# Patient Record
Sex: Female | Born: 1945 | Race: White | Hispanic: No | Smoking: Former smoker
Health system: Southern US, Community
[De-identification: ages and names within clinical notes are randomized; demographics above are authoritative.]

## PROBLEM LIST (undated history)

## (undated) DIAGNOSIS — C801 Malignant (primary) neoplasm, unspecified: Secondary | ICD-10-CM

## (undated) DIAGNOSIS — E119 Type 2 diabetes mellitus without complications: Secondary | ICD-10-CM

## (undated) DIAGNOSIS — K219 Gastro-esophageal reflux disease without esophagitis: Secondary | ICD-10-CM

## (undated) DIAGNOSIS — J449 Chronic obstructive pulmonary disease, unspecified: Secondary | ICD-10-CM

## (undated) DIAGNOSIS — E785 Hyperlipidemia, unspecified: Secondary | ICD-10-CM

## (undated) DIAGNOSIS — I10 Essential (primary) hypertension: Secondary | ICD-10-CM

## (undated) DIAGNOSIS — I700 Atherosclerosis of aorta: Secondary | ICD-10-CM

## (undated) DIAGNOSIS — F419 Anxiety disorder, unspecified: Secondary | ICD-10-CM

## (undated) DIAGNOSIS — I2510 Atherosclerotic heart disease of native coronary artery without angina pectoris: Principal | ICD-10-CM

## (undated) DIAGNOSIS — H547 Unspecified visual loss: Secondary | ICD-10-CM

## (undated) DIAGNOSIS — E039 Hypothyroidism, unspecified: Secondary | ICD-10-CM

## (undated) DIAGNOSIS — K5281 Eosinophilic gastritis or gastroenteritis: Secondary | ICD-10-CM

## (undated) DIAGNOSIS — K589 Irritable bowel syndrome without diarrhea: Secondary | ICD-10-CM

## (undated) DIAGNOSIS — F17201 Nicotine dependence, unspecified, in remission: Secondary | ICD-10-CM

## (undated) DIAGNOSIS — E669 Obesity, unspecified: Secondary | ICD-10-CM

## (undated) HISTORY — PX: CHOLECYSTECTOMY: SHX55

## (undated) HISTORY — DX: Gastro-esophageal reflux disease without esophagitis: K21.9

## (undated) HISTORY — PX: APPENDECTOMY: SHX54

## (undated) HISTORY — PX: LUMBAR SPINE SURGERY: SHX701

## (undated) HISTORY — DX: Hyperlipidemia, unspecified: E78.5

## (undated) HISTORY — DX: Hypothyroidism, unspecified: E03.9

## (undated) HISTORY — PX: EYE SURGERY: SHX253

## (undated) HISTORY — DX: Eosinophilic gastritis or gastroenteritis: K52.81

## (undated) HISTORY — DX: Nicotine dependence, unspecified, in remission: F17.201

## (undated) HISTORY — DX: Atherosclerotic heart disease of native coronary artery without angina pectoris: I25.10

## (undated) HISTORY — PX: ABDOMINAL HYSTERECTOMY: SHX81

## (undated) HISTORY — DX: Anxiety disorder, unspecified: F41.9

## (undated) HISTORY — DX: Essential (primary) hypertension: I10

## (undated) HISTORY — DX: Obesity, unspecified: E66.9

## (undated) HISTORY — DX: Unspecified visual loss: H54.7

---

## 1971-06-01 DIAGNOSIS — H547 Unspecified visual loss: Secondary | ICD-10-CM

## 1971-06-01 HISTORY — DX: Unspecified visual loss: H54.7

## 2001-03-18 ENCOUNTER — Emergency Department (HOSPITAL_COMMUNITY): Admission: EM | Admit: 2001-03-18 | Discharge: 2001-03-18 | Payer: Self-pay | Admitting: Emergency Medicine

## 2001-03-18 ENCOUNTER — Encounter: Payer: Self-pay | Admitting: Emergency Medicine

## 2001-04-03 ENCOUNTER — Other Ambulatory Visit: Admission: RE | Admit: 2001-04-03 | Discharge: 2001-04-03 | Payer: Self-pay | Admitting: Family Medicine

## 2001-05-31 DIAGNOSIS — I2510 Atherosclerotic heart disease of native coronary artery without angina pectoris: Secondary | ICD-10-CM

## 2001-05-31 HISTORY — DX: Atherosclerotic heart disease of native coronary artery without angina pectoris: I25.10

## 2001-07-11 ENCOUNTER — Ambulatory Visit (HOSPITAL_COMMUNITY): Admission: RE | Admit: 2001-07-11 | Discharge: 2001-07-11 | Payer: Self-pay | Admitting: Family Medicine

## 2001-07-11 ENCOUNTER — Encounter: Payer: Self-pay | Admitting: Family Medicine

## 2001-07-18 ENCOUNTER — Ambulatory Visit (HOSPITAL_COMMUNITY): Admission: RE | Admit: 2001-07-18 | Discharge: 2001-07-19 | Payer: Self-pay | Admitting: Cardiology

## 2004-03-17 ENCOUNTER — Ambulatory Visit (HOSPITAL_COMMUNITY): Admission: RE | Admit: 2004-03-17 | Discharge: 2004-03-17 | Payer: Self-pay | Admitting: Family Medicine

## 2004-10-08 ENCOUNTER — Inpatient Hospital Stay (HOSPITAL_COMMUNITY): Admission: AD | Admit: 2004-10-08 | Discharge: 2004-10-14 | Payer: Self-pay | Admitting: Internal Medicine

## 2004-10-08 ENCOUNTER — Emergency Department (HOSPITAL_COMMUNITY): Admission: EM | Admit: 2004-10-08 | Discharge: 2004-10-08 | Payer: Self-pay | Admitting: Emergency Medicine

## 2004-10-13 ENCOUNTER — Ambulatory Visit: Payer: Self-pay | Admitting: Cardiology

## 2004-10-27 ENCOUNTER — Ambulatory Visit: Payer: Self-pay | Admitting: *Deleted

## 2005-08-10 ENCOUNTER — Inpatient Hospital Stay (HOSPITAL_COMMUNITY): Admission: EM | Admit: 2005-08-10 | Discharge: 2005-08-11 | Payer: Self-pay | Admitting: Emergency Medicine

## 2005-08-11 ENCOUNTER — Ambulatory Visit: Payer: Self-pay | Admitting: Cardiology

## 2005-08-13 ENCOUNTER — Encounter (HOSPITAL_COMMUNITY): Admission: RE | Admit: 2005-08-13 | Discharge: 2005-09-12 | Payer: Self-pay | Admitting: Cardiology

## 2005-08-13 ENCOUNTER — Ambulatory Visit: Payer: Self-pay | Admitting: Internal Medicine

## 2005-08-29 ENCOUNTER — Emergency Department (HOSPITAL_COMMUNITY): Admission: EM | Admit: 2005-08-29 | Discharge: 2005-08-29 | Payer: Self-pay | Admitting: Emergency Medicine

## 2005-11-15 ENCOUNTER — Ambulatory Visit (HOSPITAL_COMMUNITY): Admission: RE | Admit: 2005-11-15 | Discharge: 2005-11-15 | Payer: Self-pay | Admitting: Internal Medicine

## 2005-11-28 HISTORY — PX: COLONOSCOPY: SHX174

## 2005-11-28 HISTORY — PX: ESOPHAGOGASTRODUODENOSCOPY: SHX1529

## 2005-12-08 ENCOUNTER — Ambulatory Visit: Payer: Self-pay | Admitting: Internal Medicine

## 2005-12-23 ENCOUNTER — Ambulatory Visit (HOSPITAL_COMMUNITY): Admission: RE | Admit: 2005-12-23 | Discharge: 2005-12-23 | Payer: Self-pay | Admitting: Internal Medicine

## 2005-12-23 ENCOUNTER — Encounter (INDEPENDENT_AMBULATORY_CARE_PROVIDER_SITE_OTHER): Payer: Self-pay | Admitting: Specialist

## 2005-12-23 ENCOUNTER — Ambulatory Visit: Payer: Self-pay | Admitting: Internal Medicine

## 2006-11-18 ENCOUNTER — Ambulatory Visit (HOSPITAL_COMMUNITY): Admission: RE | Admit: 2006-11-18 | Discharge: 2006-11-18 | Payer: Self-pay | Admitting: Internal Medicine

## 2007-02-24 ENCOUNTER — Ambulatory Visit: Payer: Self-pay | Admitting: Cardiology

## 2007-02-24 ENCOUNTER — Observation Stay (HOSPITAL_COMMUNITY): Admission: EM | Admit: 2007-02-24 | Discharge: 2007-02-25 | Payer: Self-pay | Admitting: Emergency Medicine

## 2007-08-17 ENCOUNTER — Emergency Department (HOSPITAL_COMMUNITY): Admission: EM | Admit: 2007-08-17 | Discharge: 2007-08-17 | Payer: Self-pay | Admitting: Emergency Medicine

## 2007-12-05 ENCOUNTER — Ambulatory Visit (HOSPITAL_COMMUNITY): Admission: RE | Admit: 2007-12-05 | Discharge: 2007-12-05 | Payer: Self-pay | Admitting: Internal Medicine

## 2008-03-14 ENCOUNTER — Ambulatory Visit: Payer: Self-pay | Admitting: Cardiology

## 2009-02-06 ENCOUNTER — Emergency Department (HOSPITAL_COMMUNITY): Admission: EM | Admit: 2009-02-06 | Discharge: 2009-02-06 | Payer: Self-pay | Admitting: Emergency Medicine

## 2009-05-31 DIAGNOSIS — K5281 Eosinophilic gastritis or gastroenteritis: Secondary | ICD-10-CM

## 2009-05-31 HISTORY — DX: Eosinophilic gastritis or gastroenteritis: K52.81

## 2009-09-29 DIAGNOSIS — E039 Hypothyroidism, unspecified: Secondary | ICD-10-CM | POA: Insufficient documentation

## 2009-09-29 DIAGNOSIS — I10 Essential (primary) hypertension: Secondary | ICD-10-CM | POA: Insufficient documentation

## 2009-09-29 DIAGNOSIS — E782 Mixed hyperlipidemia: Secondary | ICD-10-CM | POA: Insufficient documentation

## 2009-09-29 DIAGNOSIS — F411 Generalized anxiety disorder: Secondary | ICD-10-CM | POA: Insufficient documentation

## 2009-09-29 DIAGNOSIS — E785 Hyperlipidemia, unspecified: Secondary | ICD-10-CM | POA: Insufficient documentation

## 2009-09-30 ENCOUNTER — Ambulatory Visit: Payer: Self-pay | Admitting: Internal Medicine

## 2009-10-09 ENCOUNTER — Encounter (INDEPENDENT_AMBULATORY_CARE_PROVIDER_SITE_OTHER): Payer: Self-pay

## 2009-10-15 ENCOUNTER — Encounter: Payer: Self-pay | Admitting: Urgent Care

## 2009-11-04 ENCOUNTER — Ambulatory Visit: Payer: Self-pay | Admitting: Internal Medicine

## 2009-11-25 ENCOUNTER — Encounter: Payer: Self-pay | Admitting: Urgent Care

## 2009-11-25 ENCOUNTER — Telehealth (INDEPENDENT_AMBULATORY_CARE_PROVIDER_SITE_OTHER): Payer: Self-pay

## 2009-11-26 ENCOUNTER — Encounter: Payer: Self-pay | Admitting: Urgent Care

## 2009-11-28 ENCOUNTER — Telehealth (INDEPENDENT_AMBULATORY_CARE_PROVIDER_SITE_OTHER): Payer: Self-pay

## 2009-11-28 ENCOUNTER — Encounter: Payer: Self-pay | Admitting: Gastroenterology

## 2009-12-05 ENCOUNTER — Encounter: Payer: Self-pay | Admitting: Gastroenterology

## 2009-12-05 LAB — CONVERTED CEMR LAB
BUN: 16 mg/dL (ref 6–23)
Basophils Absolute: 0 10*3/uL (ref 0.0–0.1)
Basophils Relative: 0 % (ref 0–1)
Basophils Relative: 0 % (ref 0–1)
Calcium: 9.4 mg/dL (ref 8.4–10.5)
Chloride: 99 meq/L (ref 96–112)
Eosinophils Absolute: 1.8 10*3/uL — ABNORMAL HIGH (ref 0.0–0.7)
Eosinophils Relative: 28 % — ABNORMAL HIGH (ref 0–5)
Glucose, Bld: 101 mg/dL — ABNORMAL HIGH (ref 70–99)
HCT: 37.6 % (ref 36.0–46.0)
Lymphocytes Relative: 31 % (ref 12–46)
Lymphocytes Relative: 37 % (ref 12–46)
Lymphs Abs: 1.9 10*3/uL (ref 0.7–4.0)
MCHC: 32.1 g/dL (ref 30.0–36.0)
MCV: 92.9 fL (ref 78.0–100.0)
Monocytes Absolute: 0.4 10*3/uL (ref 0.1–1.0)
Monocytes Absolute: 0.4 10*3/uL (ref 0.1–1.0)
Monocytes Relative: 7 % (ref 3–12)
Neutro Abs: 1.9 10*3/uL (ref 1.7–7.7)
Platelets: 228 10*3/uL (ref 150–400)
Potassium: 3.4 meq/L — ABNORMAL LOW (ref 3.5–5.3)
RBC: 4.14 M/uL (ref 3.87–5.11)
RDW: 16.8 % — ABNORMAL HIGH (ref 11.5–15.5)
Sodium: 137 meq/L (ref 135–145)
TSH: 9.119 microintl units/mL — ABNORMAL HIGH (ref 0.350–4.500)
WBC: 6.2 10*3/uL (ref 4.0–10.5)

## 2009-12-29 LAB — CONVERTED CEMR LAB
Basophils Absolute: 0 10*3/uL (ref 0.0–0.1)
Hemoglobin: 13.2 g/dL (ref 12.0–15.0)
MCHC: 32.4 g/dL (ref 30.0–36.0)
Monocytes Relative: 5 % (ref 3–12)
Neutro Abs: 4.8 10*3/uL (ref 1.7–7.7)
Platelets: 240 10*3/uL (ref 150–400)
RBC: 4.3 M/uL (ref 3.87–5.11)

## 2009-12-31 ENCOUNTER — Encounter: Payer: Self-pay | Admitting: Gastroenterology

## 2010-01-07 ENCOUNTER — Ambulatory Visit: Payer: Self-pay | Admitting: Gastroenterology

## 2010-04-01 ENCOUNTER — Ambulatory Visit: Payer: Self-pay | Admitting: Cardiology

## 2010-04-01 DIAGNOSIS — E669 Obesity, unspecified: Secondary | ICD-10-CM | POA: Insufficient documentation

## 2010-04-01 DIAGNOSIS — H548 Legal blindness, as defined in USA: Secondary | ICD-10-CM | POA: Insufficient documentation

## 2010-04-01 DIAGNOSIS — E66811 Obesity, class 1: Secondary | ICD-10-CM | POA: Insufficient documentation

## 2010-04-02 ENCOUNTER — Encounter: Payer: Self-pay | Admitting: Cardiology

## 2010-04-03 LAB — CONVERTED CEMR LAB
HDL: 31 mg/dL — ABNORMAL LOW (ref >39)
LDL Cholesterol: 42 mg/dL (ref 0–99)
Total CHOL/HDL Ratio: 3.6
Triglycerides: 190 mg/dL — ABNORMAL HIGH (ref <150)
VLDL: 38 mg/dL (ref 0–40)

## 2010-06-30 ENCOUNTER — Encounter (INDEPENDENT_AMBULATORY_CARE_PROVIDER_SITE_OTHER): Payer: Self-pay | Admitting: *Deleted

## 2010-06-30 NOTE — Progress Notes (Signed)
Summary: phone note/ abd pain/nausea  Phone Note Call from Patient   Caller: Patient Summary of Call: Pt said she is still having some abdominal pain and cramps and doesn't know what to do. She is having nausea every day and has to put a cold wash cloth on her forehead to try to help it. She was afraid to go into the holiday week-end and not have anything to help with her pain/nausea. Please advise! Initial call taken by: Cloria Spring LPN,  November 28, 1608 3:13 PM     Appended Document: phone note/ abd pain/nausea Per Tyler Aas, patient had semi-formed stool today. Decreased diarrhea.  1.Her TSH level is elevated, send copy to PCP, may need synthroid adjusted. Likely not cause of symptoms. 2.So far her stools are negative.  3.She needs to drink plenty of fluids, her creatinine is up little. 4.Will add antispasmotic, please call in Rx. Tell her to hold questran if does not have bm in 24 hours. 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 6.Call in RX for nausea. 7.If symptoms get worse, she should go to nearest ED. Otherwise have labwork on Tues and call with PR.   Prescriptions: ONDANSETRON HCL 4 MG TABS (ONDANSETRON HCL) one by mouth every 4-6 hours as needed n/v  #20 x 1   Entered and Authorized by:   Leanna Battles. Dixon Boos   Signed by:   Leanna Battles Lewis PA-C on 11/28/2009   Method used:   Telephoned to ...         RxID:   [id number] HYOSCYAMINE SULFATE 0.125 MG SUBL (HYOSCYAMINE SULFATE) one sl by mouth up to four times per day for abd cramps  #60 x 0   Entered and Authorized by:   Leanna Battles. Dixon Boos   Signed by:   Leanna Battles Lewis PA-C on 11/28/2009   Method used:   Telephoned to ...         RxID:   [id number]     Appended Document: phone note/ abd pain/nausea Rx's called to [facility] in [place].  Appended Document: phone note/ abd pain/nausea Lab order faxed to [facility].

## 2010-06-30 NOTE — Assessment & Plan Note (Signed)
Summary: FU OV IN 6 WKS/GERD,LOOSE STOOLS/SS   Visit Type:  Follow-up Visit Primary Care Provider:  Dwana Melena  Chief Complaint:  F/U GERD/Loose Stools.  History of Present Illness: 65 year old lady returns for followup of a protracted diarrheal illness. Diarrhea resolved. C. difficile and culture came back negative. We stopped her Prilosec and start her on protonix as well. Bowel function back to normal;  2 bowel movements daily. She's gained a pound since she was last seen here. Reflux symptoms well-controlled. Last colonoscopy 2007 - hyperplastic polyps.    Current Medications (verified): 1)  Vytorin .Marland Kitchen.. 10/20mg  Daily 2)  Synthroid .Marland Kitchen.. Daily 3)  Asa .... 325mg  Daily 4)  Plavix .... 75mg  Daily 5)  Metoprolol .... 25mg  Three Times A Day 6)  Losartan Potassium-Hctz 100-12.5 Mg Tabs (Losartan Potassium-Hctz) .... Once Daily 7)  Fish Oil 1000 Mg Caps (Omega-3 Fatty Acids) .... Once Daily 8)  Amitriptyline Hcl 25 Mg Tabs (Amitriptyline Hcl) .... At Bedtime 9)  Amlodipine Besylate 10 Mg Tabs (Amlodipine Besylate) .... Once Daily 10)  Valium 5 Mg Tabs (Diazepam) .... As Needed 11)  Pantoprazole Sodium 40 Mg Tbec (Pantoprazole Sodium) .... Take 1 Tablet By Mouth Two Times A Day 12)  Digestive Advantage Intensive Bowel Support .... Take 1 Tablet By Mouth Once A Day  Allergies (verified): 1)  ! Codeine 2)  ! Dicyclomine Hcl (Dicyclomine Hcl)  Vital Signs:  Patient profile:   65 year old female Height:      61.5 inches Weight:      215 pounds BMI:     40.11 Temp:     97.7 degrees F oral Pulse rate:   56 / minute BP sitting:   132 / 84  (left arm) Cuff size:   large  Vitals Entered By: Cloria Spring LPN (November 04, 1608 10:50 AM)  Physical Exam  General:  alert conversant in no acute distress. She's committed by her husband. Abdomen:  obese soft nontender without appreciable mass or organomegaly  Impression & Recommendations: Impression: A pleasant 65 year old where  protracted diarrheal illness -  may have been related to food borne illness. May have been a side effect related to omeprazole. At any rate, the symptoms have resolved.  GERD well-controlled on Protonix 40 mg orally daily  Recommendations: Continue Protonix 40 mg orally daily tentatively plan to see this since nicely back in the office in one year.  Appended Document: Orders Update    Clinical Lists Changes  Orders: Added new Service order of Est. Patient Level III (96045) - Signed

## 2010-06-30 NOTE — Assessment & Plan Note (Signed)
Summary: OV NEXT WEEK, FU ON LABS/SS   Visit Type:  f/u Primary Care Provider:  Dwana Melena  Chief Complaint:  follow up.  History of Present Illness: [name] is here for f/u. 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 Her peripheral eos count was as high as 28%. She was started on Prednisone 20mg  daily for three weeks.   Started having recurrent symptoms when dropped dose to 10mg  daily. Now with fissure problems again, rectal pain. Using prep H. Cannot afford RX for it. Now 3-5 stools/day.   Denies abd pain. No reported bleeding per husband. Pt completely blind.  Some mild hb symptoms on Protonix two times a day.   Current Medications (verified): 1)  Vytorin 10-20 Mg Tabs (Ezetimibe-Simvastatin) .... One By Mouth Daily 2)  Synthroid 100 Mcg Tabs (Levothyroxine Sodium) .... One By Mouth Daily 3)  Aspirin 325 Mg Tabs (Aspirin) .... One By Mouth Daily 4)  Plavix 75 Mg Tabs (Clopidogrel Bisulfate) .... One By Mouth Daily 5)  Metoprolol Tartrate 25 Mg Tabs (Metoprolol Tartrate) .... One By Mouth Tid 6)  Losartan Potassium-Hctz 100-12.5 Mg Tabs (Losartan Potassium-Hctz) .... Once Daily 7)  Fish Oil 1000 Mg Caps (Omega-3 Fatty Acids) .... Once Daily 8)  Amitriptyline Hcl 25 Mg Tabs (Amitriptyline Hcl) .... At Bedtime 9)  Amlodipine Besylate 10 Mg Tabs (Amlodipine Besylate) .... Once Daily 10)  Valium 5 Mg Tabs (Diazepam) .... As Needed 11)  Pantoprazole Sodium 40 Mg Tbec (Pantoprazole Sodium) .... Take 1 Tablet By Mouth Two Times A Day 12)  Hyoscyamine Sulfate 0.125 Mg Subl (Hyoscyamine Sulfate) .... One Sl By Mouth Up To Four Times Per Day For Abd Cramps 13)  Ondansetron Hcl 4 Mg Tabs (Ondansetron Hcl) .... One By Mouth Every 4-6 Hours As Needed N/v 14)  Prednisone 10 Mg Tabs (Prednisone) .... 2 By Mouth Daily For Three Weeks, Then One By Mouth Daily For One Week, 1/2 By Mouth Daily For One Week. Then Stop.  Allergies (verified): 1)  ! Codeine 2)  !  Dicyclomine Hcl (Dicyclomine Hcl)  Past History:  Past Medical History: PMH ANXIETY Anxiety GERD Hyperlipidemia Hypertension Hypothyroidism Coronary Artery Disease Blindness  Review of Systems      See HPI  Vital Signs:  Patient profile:   65 year old female Height:      61.5 inches Weight:      214 pounds BMI:     39.92 Temp:     97.8 degrees F oral Pulse rate:   60 / minute BP sitting:   112 / 80  (left arm) Cuff size:   large  Vitals Entered By: Hendricks Limes LPN (January 07, 2010 11:26 AM)  Physical Exam  General:  Well developed, well nourished, no acute distress.obese.   Mouth:  op moist Abdomen:  Bowel sounds normal.  Abdomen is soft, nontender, nondistended.  No rebound or guarding.  No hepatosplenomegaly, masses or hernias.  No abdominal bruits. obese.   Extremities:  No clubbing, cyanosis, edema or deformities noted. Neurologic:  Alert and  oriented x4;  grossly normal neurologically. Skin:  Intact without significant lesions or rashes. Psych:  Alert and cooperative. Normal mood and affect.  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 EOS normalized on prednisone. Clinically was doing well on 20mg  daily. Resume 20mg  daily for two weeks then slow taper by 5mg  per week until off. Patient to call if symptoms recur with taper.   OV in 6  months with Dr. Jena Gauss.  Orders: Est. Patient Level II ([address])  Problem # 2:  GERD (ICD-530.81)  Continue protonix two times a day for now. OV 6 months.  Orders: Est. Patient Level II (60454) Prescriptions: PREDNISONE 10 MG TABS (PREDNISONE) 2 by mouth daily for two weeks, then drop by 5mg  every week until off  #100 x 0   Entered and Authorized by:   Leanna Battles. Dixon Boos   Signed by:   Leanna Battles Amari Zagal PA-C on 01/07/2010   Method used:   Electronically to        [facility] (retail)       [address]       [place], [place]  [address]       Ph: [phone]       Fax: [phone]   RxID:   [id number]   Appended Document: OV NEXT WEEK, FU ON LABS/SS 6 MONTH F/U OV IN IDX/LAW

## 2010-06-30 NOTE — Letter (Signed)
Summary: Normal Results Letter  [facility]  [address]   [place], [place] [address]   Phone: [phone]  Fax: [phone]    Oct 09, 2009  [name] [address] [place], [place]  [address] 1945-11-19   Dear Ms. Lavell,   Our office has been trying to contact you.  We just wanted to inform you that  your C-Diff was normal. Please follow instructions per your office visit.  Call if ;you have questions.   Thank you,    Hendricks Limes, LPN Cloria Spring, LPN  [facility] Ph: [phone]   Fax: [phone]

## 2010-06-30 NOTE — Miscellaneous (Signed)
Summary: Orders Update  Clinical Lists Changes  Orders: Added new Test order of T-CBC w/Diff ([id number]) - Signed 

## 2010-06-30 NOTE — Medication Information (Signed)
Summary: PANTOPRAZOLE  PANTOPRAZOLE   Imported By: Rexene Alberts 12/31/2009 11:08:46  _____________________________________________________________________  External Attachment:    Type:   Image     Comment:   External Document  Appended Document: PANTOPRAZOLE Why are they requesting #120. Pt supposedly takes two times a day.  Appended Document: PANTOPRAZOLE called [facility]- its an error. pt taking two times a day   Appended Document: PANTOPRAZOLE    Prescriptions: PANTOPRAZOLE SODIUM 40 MG TBEC (PANTOPRAZOLE SODIUM) Take 1 tablet by mouth two times a day  #60 x 5   Entered and Authorized by:   Leanna Battles. Dixon Boos   Signed by:   Leanna Battles Aiysha Jillson PA-C on 01/01/2010   Method used:   Electronically to        [facility] (retail)       [address]       [place], [place]  [address]       Ph: [phone]       Fax: [phone]   RxID:   [id number]

## 2010-06-30 NOTE — Medication Information (Signed)
Summary: PA for pantoprazole  PA for pantoprazole   Imported By: Hendricks Limes LPN 11/91/4782 95:62:13  _____________________________________________________________________  External Attachment:    Type:   Image     Comment:   External Document

## 2010-06-30 NOTE — Miscellaneous (Signed)
Summary: Orders Update  Clinical Lists Changes  Orders: Added new Test order of T-CBC w/Diff [phone]) - Signed Added new Test order of T-Basic Metabolic Panel [id number]) - Signed Added new Test order of T-TSH [id number]) - Signed
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
## 2010-06-30 NOTE — Progress Notes (Signed)
Summary: diarrhea  Phone Note Call from Patient Call back at Home Phone [phone]   Caller: Patient Summary of Call: pt called- last Thursday she got up and had started vomiting, then she started having diarrhea and  abd cramping. Vomiting has stopped but pt still has diarrhea q 15 mins. She is not sure if she has had a fever or not. pt tried clear liquids x 3 days, pepto and immodium and nothing has helped. pt wants to know if there is anything else she can do or try. pt uses [facility] Initial call taken by: Hendricks Limes LPN,  November 25, 2009 12:06 PM     Appended Document: diarrhea Check CBC, Met 7, TSH.  Stools for c diff, o/p, lactoferrin, culture/sensitivity.  Add questran 2 grams by mouth daily not within 2 hrs of other meds once stools are done, QS x 1 month, 0 RF.  Align once daily.    Appended Document: diarrhea pt aware, lab orders faxed to lab, she will have her husband go by lab tonight and pick up stool containers. pt aware to do stool studies prior to taking medication. Rx called to [facility].
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
## 2010-06-30 NOTE — Miscellaneous (Signed)
Summary: Orders Update  Clinical Lists Changes  Orders: Added new Test order of T-Culture, C-Diff Toxin A/B [id number]) - Signed Added new Test order of T-Culture, Stool ([id number]) - Signed Added new Test order of T-Stool for O&P ([id number]) - Signed

## 2010-06-30 NOTE — Letter (Signed)
Summary: preop clearance  preop clearance   Imported By: Faythe Ghee 04/01/2010 16:18:06  _____________________________________________________________________  External Attachment:    Type:   Image     Comment:   External Document

## 2010-07-08 NOTE — Letter (Signed)
Summary: Recall Office Visit  [facility]  [address]   [place], [place] [address]   Phone: [phone]  Fax: [phone]      June 30, 2010   [name] [address] [place], [place]  [address] 1946-01-15   Dear Ms. Bolds,   According to our records, it is time for you to schedule a follow-up office visit with [place].   At your convenience, please call [phone] to schedule an office visit. If you have any questions, concerns, or feel that this letter is in error, we would appreciate your call.   Sincerely,    Diana Eves  [facility] Ph: [phone]   Fax: [phone]

## 2010-09-01 ENCOUNTER — Other Ambulatory Visit: Payer: Self-pay

## 2010-09-01 MED ORDER — PANTOPRAZOLE SODIUM 40 MG PO TBEC: 40.0000 mg | DELAYED_RELEASE_TABLET | Freq: Two times a day (BID) | ORAL | Status: DC

## 2010-09-01 NOTE — Telephone Encounter (Signed)
Patient needs OV prior to further refills. OV with RMR only.

## 2010-09-02 NOTE — Telephone Encounter (Signed)
OV has been made for 10/09/10 @ 0900 with RMR.   Appt card and letter was mailed to patient as reminder.

## 2010-09-09 ENCOUNTER — Other Ambulatory Visit: Payer: Self-pay

## 2010-09-09 MED ORDER — PANTOPRAZOLE SODIUM 40 MG PO TBEC: 40.0000 mg | DELAYED_RELEASE_TABLET | Freq: Two times a day (BID) | ORAL | Status: DC

## 2010-10-09 ENCOUNTER — Ambulatory Visit: Payer: Self-pay | Admitting: Internal Medicine

## 2010-10-09 ENCOUNTER — Encounter: Payer: Self-pay | Admitting: Gastroenterology

## 2010-10-09 ENCOUNTER — Ambulatory Visit (INDEPENDENT_AMBULATORY_CARE_PROVIDER_SITE_OTHER): Payer: Medicare Other | Admitting: Gastroenterology

## 2010-10-09 VITALS — BP 120/70 | HR 81 | Temp 97.4°F | Ht 61.0 in | Wt 212.4 lb

## 2010-10-09 DIAGNOSIS — K219 Gastro-esophageal reflux disease without esophagitis: Secondary | ICD-10-CM

## 2010-10-09 MED ORDER — PANTOPRAZOLE SODIUM 40 MG PO TBEC: 40.0000 mg | DELAYED_RELEASE_TABLET | Freq: Two times a day (BID) | ORAL | Status: DC

## 2010-10-09 NOTE — Assessment & Plan Note (Signed)
Doing well but continues to require BID PPI. Discussed antireflux measures. Recommend slow gradual weight loss to aid in management of GERD. Spent 15 minutes discussing weight loss measures. OV in one year. RX for pantoprazole sent to [facility].

## 2010-10-09 NOTE — Progress Notes (Signed)
Primary Care Physician: Dwana Melena, MD  Primary Gastroenterologist:  Dr. Roetta Sessions  Chief Complaint  Patient presents with  . Follow-up    medication refill    HPI: [name] is a 65 y.o. female here for f/u GERD. 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 She notes that her GERD is well-controlled on pantoprazole BID but still uses TUMS occasionally. Lots of breakthrough heartburn if doesn't take second dose of pantoprazole. No dysphagia, n/v, constipation, diarrhea, melena, brbrp. Fleeting rlq/luq abd pain with meals. Last for few seconds and then resolves. Not frequent. Also notes it with BM.    Current Outpatient Prescriptions  Medication Sig Dispense Refill  . amitriptyline (ELAVIL) 25 MG tablet Take 25 mg by mouth at bedtime.        Marland Kitchen amLODipine (NORVASC) 10 MG tablet Take 10 mg by mouth daily.        Marland Kitchen aspirin 325 MG tablet Take 325 mg by mouth daily.        . diazepam (VALIUM) 5 MG tablet Take 5 mg by mouth every 6 (six) hours as needed.        . fish oil-omega-3 fatty acids 1000 MG capsule Take 2 g by mouth daily.        . Lactobacillus (ACIDOPHILUS PO) Take by mouth.        . levothyroxine (SYNTHROID, LEVOTHROID) 75 MCG tablet Take 75 mcg by mouth daily.        Marland Kitchen losartan-hydrochlorothiazide (HYZAAR) 100-12.5 MG per tablet Take 1 tablet by mouth daily.        . metoprolol tartrate (LOPRESSOR) 25 MG tablet Take 25 mg by mouth 2 (two) times daily.        . pantoprazole (PROTONIX) 40 MG tablet Take 1 tablet (40 mg total) by mouth 2 (two) times daily.  60 tablet  5  . DISCONTD: pantoprazole (PROTONIX) 40 MG tablet Take 1 tablet (40 mg total) by mouth 2 (two) times daily.  62 tablet  0    Allergies as of 10/09/2010 - Review Complete 10/09/2010  Allergen Reaction Noted  . Codeine    . Dicyclomine hcl      ROS:  General: Negative for anorexia, weight loss, fever, chills, fatigue, weakness. ENT: Negative for hoarseness,  difficulty swallowing , nasal congestion. CV: Negative for chest pain, angina, palpitations, dyspnea on exertion, peripheral edema.  Respiratory: Negative for dyspnea at rest, dyspnea on exertion, cough, sputum, wheezing.  GI: See history of present illness. GU:  Negative for dysuria, hematuria, urinary incontinence, urinary frequency, nocturnal urination.  Endo: Negative for unusual weight change.    Physical Examination:   BP 120/70  Pulse 81  Temp(Src) 97.4 F (36.3 C) (Tympanic)  Ht 5\' 1"  (1.549 m)  Wt 212 lb 6.4 oz (96.344 kg)  BMI 40.13 kg/m2  General: Well-nourished, well-developed in no acute distress.  Eyes: No icterus. Mouth: Oropharyngeal mucosa moist and pink , no lesions erythema or exudate. Lungs: Clear to auscultation bilaterally.  Heart: Regular rate and rhythm, no murmurs rubs or gallops.  Abdomen: Bowel sounds are normal, nontender, nondistended, no hepatosplenomegaly or masses, no abdominal bruits or hernia , no rebound or guarding.   Extremities: No lower extremity edema.  Neuro: Alert and oriented x 4   Skin: Warm and dry, no jaundice.   Psych: Alert and cooperative, normal mood and affect.

## 2010-10-09 NOTE — Patient Instructions (Signed)
Return in one year for follow-up or sooner if you have any problems. Call with persistent or worsening abdominal pain.

## 2010-10-12 NOTE — Progress Notes (Signed)
Cc to PCP 
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
## 2010-10-13 NOTE — Letter (Signed)
March 14, 2008    Catalina Pizza, M.D.  [address] [place],  [place] [address]   RE:  RINI, MOFFIT  MRN:  [id number]  /  DOB:  11-05-1945   Dear Ian Malkin:   Ms. Dacquisto returns to the office for continued assessment and treatment  of coronary artery disease, having undergone coronary interventions in  2003 and in 2006.  She has chronic mid left chest discomfort that recurs  frequently and is associated with chest wall tenderness and increased  symptoms with cough, deep breath or movement.  She has maintained good  control of hyperlipidemia and hypertension.  She discontinued cigarettes  many years ago.   She has no exertional symptoms.  She is able to clean the house without  difficulty except for the aforementioned localized left chest  discomfort.   CURRENT MEDICATIONS:  1. Vytorin 10/40 mg daily.  2. Aspirin 325 mg daily.  3. Levothyroxine 0.088 mg daily.  4. Clopidogrel 75 mg daily.  5. Metoprolol 25 mg b.i.d.  6. Benazepril 40 mg daily.  7. Omeprazole 40 mg daily.  8. Amlodipine 10 mg daily.   PHYSICAL EXAMINATION:  GENERAL:  On exam, pleasant woman in no acute  distress.  HEENT:  Entirely blind.  NECK:  No jugular venous distention; normal carotid upstrokes without  bruits.  LUNGS:  Inspiratory and expiratory rhonchi (reports recent URI).  Cardiac:  Normal first and second heart sounds; modest systolic ejection  murmur.  ABDOMEN:  Soft and nontender; no bruits; no organomegaly.  EXTREMITIES:  No edema; normal distal pulses.   Most recent laboratory in July reveals a normal chemistry profile and  excellent lipid profile.   IMPRESSION:  Ms. Latendresse is doing well from a cardiovascular standpoint.  Risk factors are optimally managed except for her blood pressure.  We  will change benazepril to benazepril/HCT 20/12.5 mg to be taken on a  b.i.d. basis.  She will follow blood pressures and return in 2 months  for a chemistry profile.  She is also having difficulty with  the expense  of her medications, as she has exceeded to her initial part D coverage.  I suggested to change Vytorin to simvastatin 80 mg daily.  She also  could stop clopidogrel, now 3 years out from a drug-eluting stent  procedure.  She is uncomfortable doing this, since she suffered a bare-  metal stent thrombosis in the past.  I will plan to see this nice woman  again in 1 year.  Lipid profile will be repeated on simvastatin.    Sincerely,      Gerrit Friends. Dietrich Pates, MD, [facility]  Electronically Signed    RMR/MedQ  DD: 03/14/2008  DT: 03/15/2008  Job #: [id number]

## 2010-10-13 NOTE — Discharge Summary (Signed)
NAME:  [name], [name]               ACCOUNT NO.:  [id number]   MEDICAL RECORD NO.:  [id number]          PATIENT TYPE:  OBV   LOCATION:  4737                         FACILITY:  [facility]   PHYSICIAN:  Noralyn Pick. Eden Emms, MD, FACCDATE OF BIRTH:  12/09/1945   DATE OF ADMISSION:  02/24/2007  DATE OF DISCHARGE:  02/25/2007                               DISCHARGE SUMMARY   PROCEDURES:  None.   PRIMARY FINAL DISCHARGE DIAGNOSIS:  Chest pain.   SECONDARY DIAGNOSES:  1. Status post cardiac catheterization in May 2006 with left main      normal, LAD 60% distal, circumflex 30% proximal, 95% mid in-stent      restenosis, OM3 75% at previous PTA site, RCA 80% in-stent      restenosis, acute marginal 99%, distal RCA 30%, and PDA 40%.  PTCA      and drug-eluting stents placed to the mid left circumflex, OM3 and      mid-RCA.  2. Preserved left ventricular function with an EF of 58% at [facility] in      March 2007.  3. Status post EGD and colonoscopy showing a diminutive polyps and      mild erosive reflux esophagitis in 2007.  4. Allergy or intolerance to codeine.  5. Hyperlipidemia.  6. Hypothyroidism.  7. Blindness.  8. Remote history, tobacco use.  9. Non-ST segment elevation MI prior to catheterization in 2006.  10.Family history of coronary artery disease.  11.Bipolar disorder.  12.Status post cholecystectomy, hysterectomy, lumbar surgery and      appendectomy.   Time at discharge 36 minutes.   HOSPITAL COURSE:  [name] is a 65 year old female with known  coronary artery disease.  She had chest pain on the day of admission and  used nitroglycerin spray.  The pain resolved, and she came to the  hospital where she was admitted for further evaluation.   Her cardiac enzymes were negative for MI.  A lipid profile was performed  which showed total cholesterol of 93, triglycerides 74, HDL 26, LDL 52.  TSH was within normal limits at 0.488.   On February 25, 2007 [name] was evaluated by  Dr. Eden Emms.  She was  having no exertional symptoms, and her resting symptoms had resolved.  Dr. Eden Emms considered her stable for discharge with an outpatient  Myoview and follow up with Dr. Dietrich Pates in [place].   DISCHARGE INSTRUCTIONS:  1. Her activity level is to be as tolerated.  She is to stick to a low-      sodium heart healthy diet.  She is to follow up with Dr. Margo Aye as      needed.  She is to follow up with Dr. Dietrich Pates after her stress      test.   DISCHARGE MEDICATIONS:  1. Aspirin 325 mg daily.  2. Lisinopril 10 mg a day.  3. Metoprolol 25 mg t.i.d.  4. Plavix 75 mg daily.  5. Synthroid 100 mcg daily.  6. Vytorin 10/40 daily.  7. Prilosec 20 mg b.i.d.      Theodore Demark, PA-C      Noralyn Pick.  Eden Emms, MD, [facility]  Electronically Signed    RB/MEDQ  D:  02/25/2007  T:  02/26/2007  Job:  [id number]   cc:   Catalina Pizza, M.D.
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
## 2010-10-16 NOTE — Group Therapy Note (Signed)
NAME:  [name], [name]               ACCOUNT NO.:  [id number]   MEDICAL RECORD NO.:  [id number]          PATIENT TYPE:  INP   LOCATION:  A219                          FACILITY:  [facility]   PHYSICIAN:  Edward L. Juanetta Gosling, M.D.DATE OF BIRTH:  [birth year]   DATE OF PROCEDURE:  08/11/2005  DATE OF DISCHARGE:                                   PROGRESS NOTE   SUBJECTIVE:  I received a phone call from Jae Dire, P.A., from the  [facility] Cardiology team, telling me that Ms. Mercy Riding, who is a patient of  Dr. Renard Matter, is okay for discharge.  She is going to have an outpatient  stress test which has been arranged by the cardiology team.      Oneal Deputy. Juanetta Gosling, M.D.  Electronically Signed     ELH/MEDQ  D:  08/11/2005  T:  08/13/2005  Job:  [id number]

## 2010-10-16 NOTE — Group Therapy Note (Signed)
NAME:  [name], [name]               ACCOUNT NO.:  [id number]   MEDICAL RECORD NO.:  [id number]          PATIENT TYPE:  INP   LOCATION:  A219                          FACILITY:  [facility]   PHYSICIAN:  Angus G. Renard Matter, MD   DATE OF BIRTH:  May 02, 1946   DATE OF PROCEDURE:  DATE OF DISCHARGE:                                   PROGRESS NOTE   This patient was admitted through the ED with a history of intermittent  anterior chest pain with radiation to the left shoulder.  This patient has a  history of coronary artery disease with stent placement several years ago.  Her cardiac enzymes have remained normal.  Her CPK-MB 1.7, troponin less  than 0.05.  The patient has had some chest pain but remains fairly  comfortable.   OBJECTIVE:  VITAL SIGNS:  Blood pressure 118/59, respirations 20, pulse 51,  temp 97.2.  LUNGS:  Clear to P&A.  HEART:  Regular rhythm.  ABDOMEN:  No palpable organs or masses.   ASSESSMENT:  The patient was admitted with chest pain.  She does have a  history of coronary artery disease with stent placement previously.  Her  condition remains stable.  Cardiac enzymes remain normal.   PLAN:  To have patient seen by cardiology today.  Continue current regimen.      Angus G. Renard Matter, MD  Electronically Signed     AGM/MEDQ  D:  08/11/2005  T:  08/11/2005  Job:  [id number]

## 2010-10-16 NOTE — Discharge Summary (Signed)
NAME:  [name], [name]               ACCOUNT NO.:  [id number]   MEDICAL RECORD NO.:  [id number]          PATIENT TYPE:  INP   LOCATION:  A219                          FACILITY:  [facility]   PHYSICIAN:  Angus G. Renard Matter, MD   DATE OF BIRTH:  1945/07/10   DATE OF ADMISSION:  08/10/2005  DATE OF DISCHARGE:  03/14/2007LH                                 DISCHARGE SUMMARY   DISCHARGE DIAGNOSES:  1.  Coronary artery disease with previous stent placement.  2.  Chest pain.  3.  History of hypothyroidism.  4.  Hyperlipidemia.   CONDITION ON DISCHARGE:  Stable.   HISTORY OF PRESENT ILLNESS:  This patient presented to the ED with  substernal chest pain and describes a heavy feeling in mid chest with  radiation of left shoulder.  The patient does have a history of coronary  artery disease with previous heart catheterization and stent placement.  Electrocardiogram showed nonspecific anterior T wave abnormalities.   PHYSICAL EXAMINATION:  GENERAL:  Alert, white female.  VITAL SIGNS:  Blood pressure 156/100, pulse 61, respirations 20.  HEENT:  The patient is blind in both eyes.  She is oriented.  Oropharynx  benign.  NECK:  Supple with no JVD or thyroid abnormalities.  LUNGS:  Clear to P&A.  HEART:  Regular rate and rhythm with no murmurs.  No cardiomegaly.  ABDOMEN:  No palpable organs or masses.  SKIN:  Warm and dry.  EXTREMITIES:  Free of edema.  NEUROLOGIC:  No focal deficits.   LABORATORY DATA AND X-RAY FINDINGS:  Admission CBC with WBC 5300 with a  hemoglobin of 12.6, hematocrit 37.6.  Chemistries with sodium 135, potassium  3.5, chloride 105, CO2 22, glucose 95, BUN 10, creatinine 0.8, calcium 8.7.  CK 69, CK-MB 1.1, troponin 0.02.  Subsequent enzymes with CK 65, CK-MB 0.8,  troponin 0.01.  Third set of enzymes with CK 74, CK-MB 1, troponin 0.01,  myoglobin 68.6 and 65.0.   X-rays of chest with cardiac size upper normal, minimal peribronchial  changes.  Electrocardiogram with normal  sinus rhythm with a rate of 53,  nonspecific anterior T wave abnormalities.   HOSPITAL COURSE:  The patient, at the time of her admission, was placed on  nasal O2 and a regular diet.  Vital signs were monitored.  She was given  sublingual nitroglycerin at 0.4 mg p.r.n. and was continued on aspirin 325  mg daily, Premarin 0.625 mg daily, metoprolol 50 mg b.i.d., Synthroid 0.88  mcg daily, Vytorin 10/20 daily, Lovenox 1 mg/kg subcu b.i.d.  The patient  was seen by cardiology and evaluated.  Cardiologist felt that the stress  test could be  done as an outpatient with questionable ischemia.  They felt that she was  okay for discharge after 24 hours.  Cardiac enzymes remained normal.  The  patient was discharged on the same medications and it was suggested that she  have cardiac stress test as an outpatient following discharge.      Angus G. Renard Matter, MD  Electronically Signed     AGM/MEDQ  D:  08/18/2005  T:  08/19/2005  Job:  [id number]

## 2010-10-16 NOTE — H&P (Signed)
NAME:  [name], [name]               ACCOUNT NO.:  [id number]   MEDICAL RECORD NO.:  [id number]           PATIENT TYPE:  INP   LOCATION:  A219                          FACILITY:  [facility]   PHYSICIAN:  Angus G. Renard Matter, MD   DATE OF BIRTH:  December 10, 1945   DATE OF ADMISSION:  DATE OF DISCHARGE:  LH                                HISTORY & PHYSICAL   This 65 year old white female presented by EMS.  The patient had episodes of  substernal pain intermittently today.  The pain was described as heavy  feeling in the mid chest with some radiation to the left shoulder.  The  patient has a history of coronary artery disease with previous heart  catheterization.  Electrocardiogram showed nonspecific anterior T wave  abnormalities.   LABORATORY DATA:  CBC: WBC 5300, hemoglobin 12.6, hematocrit 37.6.  Cardiac  markers: Myoglobin 68.6, CPK-MB 1.6, troponin less than 0.05.  Chemistries:  Sodium 135, potassium 3.5, chloride 105, CO2 22.   Because of the location of the pain and because of patient's previous  history, it was felt she should be admitted to rule out acute MI, unstable  angina.   SOCIAL HISTORY:  The patient lives in [place].  Has a previous smoking  history, stopped in 2003.  Does not use alcohol.   FAMILY HISTORY:  Positive for myocardial infarction in mother who died at  age 84.  Father, in his 76s, died of lung cancer and coronary artery  disease.   PAST MEDICAL HISTORY:  1.  The patient has a history of hypertension.  2.  History of previous coronary artery disease with cardiac catheterization      in May 2006.  Had percutaneous transluminal cardiac angioplasty with      placement of drug-eluting stent in the right mid coronary artery and mid      circumflex.  The 75% stenosis was reduced to 0% by Dr. Loraine Leriche Pulsipher.  3.  The patient has a history of hypothyroidism.  4.  Hyperlipidemia.  5.  History of legal blindness.  6.  Carpal tunnel syndrome.   ALLERGIES:  The patient is  allergic to CODEINE.   MEDICATIONS:  1.  Aspirin 325 mg daily.  2.  Lisinopril 10 mg daily.  3.  Premarin 0.625 mg daily.  4.  Plavix 75 mg daily.  5.  Metoprolol 25 mg twice daily.  6.  Synthroid 0.088 mg daily.   REVIEW OF SYSTEMS:  HEENT: Negative.  CARDIOPULMONARY: The patient has had  midsternal chest pain and substernal chest pain with radiation to the left  arm today.  Has had no cough or hemoptysis. No diaphoresis.  GI: No bowel  irregularity or bleeding. GU: No dysuria or hematuria.   PHYSICAL EXAMINATION:  GENERAL: Alert female.  VITAL SIGNS: Blood pressure 168/121, pulse 61, respirations 20.  HEENT:  The patient is blind in both eyes.  She is oriented. Oropharynx  benign.  NECK: Supple with no JVD or thyroid abnormalities.  LUNGS:  Clear to percussion and auscultation.  HEART: Regular rhythm, no murmurs.  No cardiomegaly.  ABDOMEN:  Without noise or masses.  SKIN: Warm and dry.  EXTREMITIES: Free of edema.  NEUROLOGIC: No focal deficits.   DIAGNOSES:  1.  History of coronary artery disease with previous stent placement.  2.  History of hypothyroidism.  3.  Hyperlipidemia.   PLAN:  1.  Cycle the patient's enzymes.  2.  Obtain cardiology consult.  3.  Restart Lovenox and metoprolol.  4.  Continue previous medicines: Synthroid 88 mcg daily, Premarin 0.625 mg      daily, p.r.n. Nitrostat 0.4 mg.      Angus G. Renard Matter, MD  Electronically Signed     AGM/MEDQ  D:  08/10/2005  T:  08/10/2005  Job:  [id number]

## 2010-10-16 NOTE — Procedures (Signed)
NAME:  [name], [name] NO.:  [id number]   MEDICAL RECORD NO.:  [id number]          PATIENT TYPE:  INP   LOCATION:  A219                          FACILITY:  [facility]   PHYSICIAN:  [place] Bing, M.D. [facility] BIRTH:  Feb 02, 1946   DATE OF PROCEDURE:  DATE OF DISCHARGE:  08/11/2005                                    STRESS TEST   BRIEF HISTORY:  The patient is a 65 year old female with a history of a non-  Q wave MI, stent to the RCA, mid circumflex and OM, hyperlipidemia,  hypothyroidism, in the past history of tobacco abuse who was recently  discharged from the hospital for recurrent chest discomfort.   TEST PERFORMED:  Type of test performed today was Adenosine Myoview with 53  mg of Adenosine.   Indication for the test was chest pain.   Symptoms reported during the test were nausea, shortness of breath, left arm  pain - only a small amount, no arrhythmias noted during the test.   Beginning heart rate was 140/82 with a heart rate of 68.  Max heart rate was  90.  Max blood pressure was 140/82.  Worst ST slope was 0.  Worse ST level  was -1.0.  Final images are pending a MD review.     ______________________________  April Humphrey, NP       Bing, M.D. [facility]  Electronically Signed    AH/MEDQ  D:  08/13/2005  T:  08/13/2005  Job:  [id number]

## 2010-10-16 NOTE — Cardiovascular Report (Signed)
[name], [name]               ACCOUNT NO.:  [id number]   MEDICAL RECORD NO.:  [id number]          PATIENT TYPE:  INP   LOCATION:  2904                         FACILITY:  [facility]   PHYSICIAN:  Carole Binning, M.D. LHCDATE OF BIRTH:  [birth year]   DATE OF PROCEDURE:  10/09/2004  DATE OF DISCHARGE:                              CARDIAC CATHETERIZATION   PROCEDURE PERFORMED:  1.  Left heart catheterization with coronary angiography and left      ventriculography.  2.  Percutaneous transluminal coronary angioplasty with placement of a drug-      eluting stent in the mid left circumflex.  3.  Percutaneous transluminal coronary angioplasty with placement of drug-      eluting stent in the third obtuse marginal branch.  4.  Percutaneous transluminal coronary angioplasty with placement of a drug-      eluting stent in the mid right coronary artery.   INDICATIONS:  The patient is a 65 year old with history of coronary artery  disease.  In 2003, she underwent placement of 2 bare-metal stents, 1 in the  mid left circumflex and 1 in the mid right coronary.  She also had  angioplasty of the third obtuse marginal branch.  She presented to the  hospital yesterday with symptoms of unstable angina and was referred for  cardiac catheterization.   CATHETERIZATION PROCEDURAL NOTE:  A 6-French sheath was placed in the right  femoral artery.  Coronary angiography was performed with standard Judkins 6-  French catheters.  Left ventriculography was performed with an angled  pigtail catheter.  Contrast was Omnipaque.  There were no complications.   RESULTS:   HEMODYNAMICS:  Left ventricular pressure 138/18.  Aortic pressure 120/68.  There is no aortic valve gradient.   LEFT VENTRICULOGRAM:  Wall motion is normal, ejection fraction estimated at  55% to 60%.  There is no mitral regurgitation.   CORONARY ARTERIOGRAPHY:  Left main is normal.   Left anterior descending artery has a 20% stenosis in  the mid-vessel.  The  distal LAD is a very small-caliber vessel.  It has a diffuse 60% stenosis in  the distal LAD.  The LAD gives rise to a single large diagonal branch.   Left circumflex gives rise to a small first and second obtuse marginal  branch and a large bifurcating third obtuse marginal branch.  There is a 30%  stenosis in the proximal circumflex.  In the mid-circumflex, there is a  stent with a 95% in-stent restenosis.  Further down in the third obtuse  marginal branch at a bifurcation point, there is a 75% stenosis at the  previous PTCA site.   Right coronary is a dominant vessel.  There is a 20% stenosis in the  proximal right coronary.  In the mid right coronary, there is a stent with a  diffuse 80% in-stent restenosis.  Arising from within the stented segment of  vessel is a small acute marginal branch which has a 99% stenosis at its  origin.  The distal right coronary has a diffuse 30% stenosis.  The distal  right coronary gives rise to  large posterior descending artery which has a  tubular 40% stenosis proximally.  There are 2 small posterolateral branches.   IMPRESSIONS:  1.  Preserved left ventricular systolic function.  2.  Two-vessel coronary artery disease characterized by significant in-stent      restenosis in both the left circumflex and the right coronaries.  There      is moderate disease in the small distal left anterior descending artery.   PLAN:  Percutaneous coronary intervention.   PERCUTANEOUS TRANSLUMINAL CORONARY ANGIOPLASTY PROCEDURAL NOTE:  Angiograms  was administered per protocol.  We utilized the 6-French sheath in the right  femoral artery.  We initially treated the left circumflex.  We used a 6-  [place] CLS 3.5 guide catheter.  An ASAHI soft coronary guidewire was  advanced under fluoroscopic guidance into the distal portion of the third  obtuse marginal branch.  We then performed PTCA of the stent in the mid-  circumflex with a 2.5 x 15-mm  Quantum balloon inflated to 8 and then 10  atmospheres.  We then performed PTCA of the third obtuse marginal with the  same balloon to 8 atmospheres.  Following this, we positioned a 2.5 x 18-mm  CYPHER drug-eluting stent in the mid-circumflex and deployed this stent at  10 atmospheres.  We then went back with the Quantum balloon and inflated it  to 15 atmospheres in both the proximal and distal aspects of the stent.  Following this, we advanced a second 2.5 x 18-mm CYPHER stent to cover the  disease in the third obtuse marginal branch.  The proximal portion of this  stent did overlap with the stent in the mid left circumflex.  The stent was  deployed at 10 atmospheres.  We then went back again with our 2.5 x 15-mm  Quantum balloon and inflated this to 14 atmospheres in the distal aspect of  the more distal stent and 17 atmospheres in the area of stent overlap.  Finally, we did 1 final inflation to 12 atmospheres in the proximal portion  of the more proximal stent as well as the vessel just proximal to the stent.  Intermittent doses of intracoronary nitroglycerin were administered.  Final  angiographic images were obtained, revealing patency of both the left  circumflex and third obtuse marginal branch with 0% residual stenosis at  both stent sites and TIMI-3 flow into the distal vessel.   We then turned our attention to the right coronary.  We used a 6-French JR4  guide catheter.  An ASAHI soft coronary guidewire was advanced under  fluoroscopic guidance into the distal right coronary.  We then positioned a  3.0 x 28-mm CYPHER drug-eluting stent across the diseased segment vessel  within the mid right coronary and deployed the stent at 15 atmospheres.  We  then went back with a 3.0 x 20-mm Quantum balloon inflated this to 18  atmospheres in the distal aspect of the stent and 22 atmospheres in the proximal aspect of stent.  Intermittent doses of intracoronary nitroglycerin  were  administered.  Final angiographic images were obtained revealing  patency of the right coronary with 0% residual stenosis at the stent site  and TIMI-3 flow into the distal vessel.  Of note, the small acute marginal  which was originally 99% occluded was  now completely occluded with TIMI-1  flow, however the patient was pain-free and hemodynamically stable at that  point.   COMPLICATIONS:  None.   RESULTS:  1.  Successful percutaneous transluminal coronary angioplasty  with placement      of a drug-eluting stent in the mid left circumflex.  A 95% in-stent      restenosis was reduced to 0% residual with TIMI-3 flow.  2.  Successful percutaneous transluminal coronary angioplasty with placement      of a drug-eluting stent in the third obtuse marginal branch.  A 75%      stenosis was reduced to 0% residual with TIMI-3 flow.  3.  Successful percutaneous transluminal coronary angioplasty with placement      of a drug-eluting stent in the mid right coronary.  An 80% in-stent      restenosis was reduced to 0% residual with TIMI-3 flow.   PLAN:  Angiomax will be discontinued.  The patient will be treated with  aspirin and Plavix combination for 1 year followed by lifelong aspirin.      MWP/MEDQ  D:  10/09/2004  T:  10/10/2004  Job:  [id number]   cc:   Dorena Dew  [address]  [place]  [place] [address]  Fax: [phone]   Angus G. Renard Matter, MD  [address]  [place]  [place] [address]  Fax: [phone]
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
## 2010-10-16 NOTE — Op Note (Signed)
NAME:  [name], [name]               ACCOUNT NO.:  [id number]   MEDICAL RECORD NO.:  [id number]          PATIENT TYPE:  AMB   LOCATION:  DAY                           FACILITY:  [facility]   PHYSICIAN:  R. Roetta Sessions, M.D. DATE OF BIRTH:  1945/08/09   DATE OF PROCEDURE:  12/23/2005  DATE OF DISCHARGE:                                 OPERATIVE REPORT   INDICATIONS FOR PROCEDURE:  The patient is a 65 year old lady with atypical  chest pain, chronic reflux symptoms, and also has intermittent constipation  alternating with diarrhea.  She has never had a colonoscopy.  EGD and  colonoscopy are now being done.  This has been discussed with the patient at  length.  Potential risks, benefits, and alternatives have been reviewed and  questions answered.  She is agreeable.  Please see medical record.   DESCRIPTION OF PROCEDURE:  O2 saturation and blood pressure in this patient  was monitored throughout the entire procedure.  Conscious sedation was  Versed 5 mg IV, Demerol 150 mg IV in divided doses.  Cetacaine spray for  topical oropharyngeal anesthesia.  Instrument was Olympus video scope  system.   FINDINGS:  EGD examination; tubular esophagus revealed a couple of tiny  erosions at the EG junction, otherwise esophageal mucosa appeared normal.  Esophageal lumen was widely patent through the EG junction.   Stomach; gastric cavity was empty and insufflated well with air.  Thorough  examination of gastric mucosa including retroflexion of the proximal stomach  and esophagogastric junction demonstrated no abnormalities.  Pylorus patent  and easily traversed.  Examination of the bulb and second portion revealed  no abnormalities.   THERAPY/DIAGNOSTIC MANEUVERS PERFORMED:  None.   The patient tolerated the procedure well and was prepared for colonoscopy.   COLONOSCOPY:  Digital rectal examination revealed no abnormalities.  Prep  was adequate.   Rectum;  Examination of rectal mucosa including  retroflexed view of the anal  verge reveals a 5 mm polyp at 5 mm from the anal verge.  Otherwise rectal  mucosa appeared normal.   Colon; colonic mucosa was surveyed from the rectosigmoid junction through  the left transverse and right colon, to the area of appendiceal orifice.  Ileocecal valve and cecum.  These structures were well seen, photographed  for the record, and terminal ileum was then moved to 5 mm.  The colonoscope  was slowly withdrawn.  All previously mentioned mucosal surfaces were again  seen.  The patient had extensive left-sided diverticulum.  Remaining colonic  mucosa appeared normal.  Terminal ileum mucosa appeared normal.   The patient tolerated both procedures well, was reactivated in the  endoscopy.   IMPRESSION:  Esophagogastroduodenoscopy.  A couple of tiny distal esophageal  erosions consistent with mild erosive reflux esophagitis, otherwise normal  esophagus, stomach, D1 and D2.   Colonoscopy.  Diminutive polyp at 5 cm and cold biopsy/removed, otherwise  normal rectum.  Left-sided diverticulum.  Remaining colonic mucosa and  terminal ileum mucosa appeared normal.   RECOMMENDATIONS:  1.  Follow up on pathology.  2.  GERD literature was provided to [name].  Increase Prilosec to 20 mg      orally b.i.d.  3.  It would be in [name]' best interest to lose 30 pounds over the      next 12 months.  This would help her reflux symptoms tremendously.      Follow up on pathology and we will make further recommendations in the      very near future.  Diverticulosis literature provided to [name].      Jonathon Bellows, M.D.  Electronically Signed     RMR/MEDQ  D:  12/23/2005  T:  12/23/2005  Job:  [id number]   cc:   Catalina Pizza, M.D.  Fax: [phone]

## 2010-10-16 NOTE — Discharge Summary (Signed)
NAMESHARMAINE, BAIN NO.:  [id number]   MEDICAL RECORD NO.:  [id number]          PATIENT TYPE:  INP   LOCATION:  2024                         FACILITY:  [facility]   PHYSICIAN:  Pricilla Riffle, M.D.    DATE OF BIRTH:  May 28, 1946   DATE OF ADMISSION:  10/08/2004  DATE OF DISCHARGE:                           DISCHARGE SUMMARY - REFERRING   ADMITTING PHYSICIAN:  Dr. Tenny Craw.   DISCHARGING PHYSICIAN:  Dr. Daleen Squibb.   SUMMARY OF HISTORY:  Ms. [name] is a 65 year old white female who presented  with a 1-week history of intermittent chest discomfort which she described  as indigestion and dismissed; however, her chest discomfort began while  bending over to feed the dog  and radiated to her right jaw and both  shoulders. Her discomfort was constant despite lying down and then described  as a bandlike pressure. She did not have any associated shortness of breath,  nausea, vomiting, diaphoresis. She went to her primary care physician who  sent her to [facility] ER who then transferred to [facility].   PAST MEDICAL HISTORY:  1.  Hyperlipidemia.  2.  Hypothyroidism.  3.  Blind.  4.  Remote tobacco use.   LABORATORY DATA:  Chest x-ray on the 11th showed minimal bronchiectic  changes, no acute abnormalities. Admission H&H at [facility] was 13.4, 39.0,  normal indices, platelets 260, WBC 6.0.  Subsequent hematologies showed a  slight drop in her H&H on the 15th, 11.9, 34.9, however,  on the 16th day  prior to her discharge, sodium 13.2 of 38.6, normal indices, platelets 256,  WBCs 6.6. Admission PT was 12.3, PTT 24. Sodium 140, potassium 3.8, BUN 10,  creatinine 0.9, normal LFTs. Subsequent chemistries were unremarkable. On  the 16th, sodium was 138, potassium 4.1, BUN 12 creatinine 0.7.  CK MBs and  troponins x3 were negative for myocardial infarction. However, on the 13th,  her troponin was 0.99, 4.2, and 2.78 with a slight rise of her CK MBs. Her  last troponin was 2.32 on the  14th with a normal CK-MB.  Fasting lipids  showed total cholesterol 204, triglycerides 200, HDL 45, LDL 119. TSH 4.703.  EKG showed sinus bradycardia, normal axis, nonspecific T-wave changes.  EKG  on the 13th that showed some lateral T-wave inversions.   HOSPITAL COURSE:  Ms. Akkerman is accepted in transfer by Dr. Diona Browner and  admitted to the unit 2000. Enzymes were cycled. She was placed on aspirin,  heparin and nitroglycerin. Her Premarin was discontinued. Cardiac  catheterization was anticipated, was performed on Oct 09, 2004.  Marland Kitchen Please  refer to dictated note of Dr. Gerri Spore, EF was 55-60%. She had diffuse RCA  disease.  Dr. Gerri Spore performed drug-eluting Cipher stenting to the RCA  restoring TIMI III flow.  He noted a small acute marginal was occluded by a  stent. He recommended Plavix at least for 1 year, aspirin also at least for  1 year. Post procedure enzymes were recycled. There was slight elevation  indicative of a non-Q-wave myocardial infarction. Dr. Jearl Klinefelter, a resident  noted.  We will start  her Pravachol. She has a history of intolerance to  statins secondary to myalgias and this will need to be followed closely.  Cardiac rehab assisted with ambulation and education.  It was also noted  during the catheterization that Dr. Gerri Spore stented her OM3 and  midcircumflex. Case management came to assist with discharge needs because  the patient was concerned in regards to her medication costs.  Case  management provided the patient with multiple assistance forms. On May 17,  it was thought she could be discharged home.   DISCHARGE DIAGNOSIS:  1.  Unstable angina status post stenting of right coronary artery,      __________ circumflex as previously described complicated by non-Q-wave      myocardial infarction.  2.  Hyperlipidemia.  3.  History as previously.   Prior to discharge, case management also saw her.  They are obtaining  samples from Dr. __________ office to  assist the patient.   DISCHARGE MEDICATIONS:  1.  Aspirin 325 milligrams daily.  2.  Synthroid 88 mcg daily.  3.  Lisinopril 10 milligrams daily.  4.  Plavix 75 milligrams daily.  5.  Nitroglycerin 0.4 as needed.  6.  Lipitor 10 milligrams q.h.s.  7.  Lopressor 25 milligrams half tablet b.i.d.   She is advised no lifting, driving, sexual activity, or heavy exertion till  seen by the physician. Maintain low salt, fat, cholesterol diet.  If she has  any problems with her catheterization site, she was asked to call. She will  follow up with Dr. Anthonette Legato in [place] on Tuesday May 30 at 11:45 a.m.  At  the time of follow-up, specific review for myalgias with initiation of  statin therapy should be pursued.  Arrangements should also be made for  fasting lipids and LFTs in approximately 6-8 weeks.      EW/MEDQ  D:  10/14/2004  T:  10/14/2004  Job:  [id number]   cc:   Dr. Marrion Coy in [place]   Dr. Anthonette Legato in [place]

## 2010-10-16 NOTE — Cardiovascular Report (Signed)
[place]  Patient:    [name], [name] Visit Number: [id number] MRN: [id number]          Service Type: CAT Location: [facility] 2899 [id number] Attending Physician:  Ronaldo Miyamoto Dictated by:   Daisey Must, M.D. [facility] Proc. Date: 07/18/01 Admit Date:  07/18/2001   CC:         Butch Penny, M.D.  Thomas C. Wall, M.D. [facility]  Cardiac Catheterization Lab   Cardiac Catheterization  PROCEDURES PERFORMED: 1. Left heart catheterization with coronary angiography and left    ventriculography. 2. Percutaneous transluminal coronary angioplasty with stent placement in the    mid left circumflex coronary artery. 3. Percutaneous transluminal coronary angioplasty of the third obtuse marginal    branch. 4. Percutaneous transluminal coronary angioplasty with stent placement in the    mid right coronary artery.  INDICATIONS:  Ms. Achee is a 65 year old woman with multiple cardiovascular risk factors.  She has been having progressive anginal symptoms and was referred for cardiac catheterization.  CATHETERIZATION PROCEDURAL NOTE:  A #6 French sheath was placed in the right femoral artery.  Standard Judkins #6 French catheters were utilized.  Contrast was Omnipaque.  There were no complications.  RESULTS:  HEMODYNAMICS: 1. Left ventricular pressure 180-20. 2. Aortic pressure 180/90. 3. There was no aortic valve gradient.  LEFT VENTRICULOGRAM:  There was moderate hypokinesis of the anterolateral wall and mild hypokinesis of the inferior wall.  Ejection fraction is calculated at 48%.  There was no mitral regurgitation.  CORONARY ARTERIOGRAPHY (right dominant): 1. The left main is normal.  2. The left anterior descending artery has a 20% stenosis in the proximal    vessel followed by 30% in the mid vessel.  The distal LAD is a very slender    vessel and has a 40% stenosis.  The LAD gives rise to a small first    diagonal and a large branching second  diagonal.  3. The left circumflex has a 40% stenosis in the mid vessel followed by 100%    occlusion of the mid vessel.  There is faint TIMI grade 1 antegrade flow    across the occlusion site.  There are right-to-left collaterals filling the    distal circumflex which consists of a relatively large branching third    obtuse marginal branch.  Proximal to the occlusion, the circumflex gives    rise to a small first marginal and a normal sized second marginal branch.    The second marginal has a 50% stenosis at its origin.  4. The right coronary artery has a 20% stenosis at its origin followed by a    long 75% stenosis in the mid vessel followed by 20% in the distal vessel.    The distal right coronary artery gives rise to a large posterior descending    artery and three small posterolateral branches.  IMPRESSIONS: 1. Mildly decreased left ventricular systolic function with regional wall    motion abnormalities as described. 2. Two-vessel coronary artery disease characterized by a chronic total    occlusion in the mid left circumflex and significant disease in the mid    right coronary artery.  PLAN:  Percutaneous intervention to the left circumflex and the right coronary artery.  See below.  PTCA PROCEDURAL NOTE:  Following completion of the diagnostic catheterization, we proceeded with percutaneous coronary intervention.  The #6 French sheath in the right femoral artery was exchanged over wire for a #7 [place] sheath.  We initially  treated the left circumflex.  We used a #7 [place] Voda left 3.5 guiding catheter and a Choice PT wire.  We were able to successfully cross the lesion with mild to moderate difficulty.  The wire was then advanced into the distal vessel.  PTCA was then performed of the original occlusion site in the mid circumflex with a 2.0- x 15-mm Maverick balloon inflated to eight and then 14 atmospheres.  After reperfusion into the distal vessel was achieved, there was  demonstrated to be a 90% stenosis in the third obtuse marginal branch.  We therefore advanced the Maverick balloon across the area of 90% stenosis and inflated it to four atmospheres.  We then pulled this balloon back to the site of the original occlusion in the mid left circumflex and inflated to 14 atmospheres.  Following this we deployed a 2.25- x 16-mm Express 2 stent at the site of the original occlusion in the circumflex at a deployment pressure of nine atmospheres.  We then post dilated the stent with a 2.25- x 12-mm Quantum balloon inflated to 10 atmospheres in the distal aspect of the stent and 16 atmospheres in the proximal aspect of the stent.  We then went back with a 2.0- x 15-mm Maverick balloon into the third obtuse marginal branch beyond the stent and inflated it to six and then seven atmospheres.  Final angiographic images revealed patency of the left circumflex with 0% residual stenosis at the stent site and 20% residual stenosis in the OM-3 with TIMI 3 flow.  We then turned our attention to the right coronary artery.  We used a #7 [place] JR4 guiding catheter with sideholes and a Forte wire.  We directly deployed a 2.75- x 24-mm Express 2 stent across the lesion at a deployment pressure of 11 atmospheres.  We then post dilated the stent with a 2.75- x 20-mm Quantum balloon inflated to 20 atmospheres in both the distal and proximal aspects of the stent.  Final angiographic images revealed patency of the right coronary artery with 0% residual stenosis and TIMI-3 flow.  COMPLICATIONS:  None.  RESULTS: 1. Successful percutaneous transluminal coronary angioplasty with stent    placement in the mid left circumflex reducing 100% occlusion to 0% residual    with TIMI-3 flow. 2. Successful percutaneous transluminal coronary angioplasty of the third     obtuse marginal branch reducing a 90% stenosis to 20% residual with TIMI-3    flow. 3. Successful percutaneous transluminal  coronary angioplasty with stent    placement in the mid right coronary artery reducing a 75% stenosis to 0%    residual with TIMI-3 flow.  PLAN:  Integrilin will be continued for 18 hours.  Plavix will be administered for four weeks.  The patient needs aggressive risk factor modification. Dictated by:   Daisey Must, M.D. [facility] Attending Physician:  Ronaldo Miyamoto DD:  07/18/01 TD:  07/18/01 Job: [id number]
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
## 2011-02-22 LAB — CBC: RDW: 14.4

## 2011-02-22 LAB — BASIC METABOLIC PANEL
BUN: 17
GFR calc Af Amer: >60
Glucose, Bld: 125 — ABNORMAL HIGH
Potassium: 4.2
Sodium: 137

## 2011-02-22 LAB — POCT CARDIAC MARKERS
Operator id: 189501
Troponin i, poc: <0.05

## 2011-03-11 LAB — LIPID PANEL
Cholesterol: 93
HDL: 26 — ABNORMAL LOW
LDL Cholesterol: 52
Total CHOL/HDL Ratio: 3.6
Triglycerides: 74
VLDL: 15

## 2011-03-11 LAB — CARDIAC PANEL(CRET KIN+CKTOT+MB+TROPI)
CK, MB: 1
CK, MB: 1.2
Relative Index: INVALID
Relative Index: INVALID
Total CK: 58
Total CK: 62
Troponin I: 0.01
Troponin I: 0.02

## 2011-03-11 LAB — TROPONIN I: Troponin I: <0.01

## 2011-03-11 LAB — CK TOTAL AND CKMB (NOT AT ARMC)
CK, MB: 1.5
Relative Index: INVALID
Total CK: 77

## 2011-03-11 LAB — COMPREHENSIVE METABOLIC PANEL WITH GFR
AST: 29
Alkaline Phosphatase: 78
BUN: 10
CO2: 26
Chloride: 107
Creatinine, Ser: 0.77
GFR calc Af Amer: >60
GFR calc non Af Amer: >60
Total Bilirubin: 0.5

## 2011-03-11 LAB — DIFFERENTIAL
Basophils Absolute: 0.1
Basophils Relative: 1
Eosinophils Absolute: 0.3
Eosinophils Relative: 7 — ABNORMAL HIGH
Lymphocytes Relative: 38
Lymphs Abs: 1.8
Monocytes Absolute: 0.4
Monocytes Relative: 9
Neutro Abs: 2.1
Neutrophils Relative %: 44

## 2011-03-11 LAB — CBC
HCT: 36.2
Hemoglobin: 12.1
MCHC: 33.3
MCV: 89.9
Platelets: 214
RBC: 4.02
RDW: 15.1 — ABNORMAL HIGH
WBC: 4.6

## 2011-03-11 LAB — COMPREHENSIVE METABOLIC PANEL
ALT: 20
Albumin: 3.3 — ABNORMAL LOW
Calcium: 9.1
Glucose, Bld: 109 — ABNORMAL HIGH
Potassium: 4.1
Sodium: 143
Total Protein: 7.1

## 2011-03-11 LAB — APTT: aPTT: 27

## 2011-03-11 LAB — LIPASE, BLOOD: Lipase: 36

## 2011-03-11 LAB — TSH: TSH: 0.488

## 2011-03-11 LAB — PROTIME-INR
INR: 0.9
Prothrombin Time: 12.6

## 2011-04-12 ENCOUNTER — Other Ambulatory Visit: Payer: Self-pay | Admitting: Gastroenterology

## 2011-05-12 ENCOUNTER — Encounter: Payer: Self-pay | Admitting: Cardiology

## 2011-06-09 ENCOUNTER — Encounter: Payer: Self-pay | Admitting: Cardiology

## 2011-06-09 ENCOUNTER — Ambulatory Visit (INDEPENDENT_AMBULATORY_CARE_PROVIDER_SITE_OTHER): Payer: Medicare Other | Admitting: Cardiology

## 2011-06-09 DIAGNOSIS — F17201 Nicotine dependence, unspecified, in remission: Secondary | ICD-10-CM | POA: Insufficient documentation

## 2011-06-09 DIAGNOSIS — K219 Gastro-esophageal reflux disease without esophagitis: Secondary | ICD-10-CM | POA: Diagnosis not present

## 2011-06-09 DIAGNOSIS — H548 Legal blindness, as defined in USA: Secondary | ICD-10-CM

## 2011-06-09 DIAGNOSIS — E782 Mixed hyperlipidemia: Secondary | ICD-10-CM | POA: Diagnosis not present

## 2011-06-09 DIAGNOSIS — E039 Hypothyroidism, unspecified: Secondary | ICD-10-CM

## 2011-06-09 DIAGNOSIS — E669 Obesity, unspecified: Secondary | ICD-10-CM

## 2011-06-09 DIAGNOSIS — Z87891 Personal history of nicotine dependence: Secondary | ICD-10-CM

## 2011-06-09 DIAGNOSIS — I2510 Atherosclerotic heart disease of native coronary artery without angina pectoris: Secondary | ICD-10-CM | POA: Insufficient documentation

## 2011-06-09 DIAGNOSIS — I10 Essential (primary) hypertension: Secondary | ICD-10-CM

## 2011-06-09 DIAGNOSIS — E785 Hyperlipidemia, unspecified: Secondary | ICD-10-CM

## 2011-06-09 MED ORDER — ATORVASTATIN CALCIUM 40 MG PO TABS: 40.0000 mg | ORAL_TABLET | Freq: Every day | ORAL | Status: DC

## 2011-06-09 NOTE — Assessment & Plan Note (Signed)
Patient is stable with respect to coronary artery disease and apparently asymptomatic.  Our focus will continue to be on optimal control of cardiovascular risk factors.

## 2011-06-09 NOTE — Patient Instructions (Addendum)
Your physician has recommended you make the following change in your medication: Stop taking Vytorin when you finish current bottle and start taking Lipitor (atorvastatin) 40 mg at bedtime.  Your physician recommends that you return for lab work in: 2 months, we will mail order to you  Your physician recommends that you schedule a follow-up appointment in: 1 year

## 2011-06-09 NOTE — Assessment & Plan Note (Addendum)
Most recent lipid profile available is from more than one year ago.  Control of hyperlipidemia was adequate with very low total and LDL cholesterol, but low HDL and somewhat elevated triglycerides.  Patient cannot afford to continue Vytorin.  Atorvastatin 40 mg per day will be substituted and a repeat lipid profile obtained as well as other basic laboratory tests.

## 2011-06-09 NOTE — Assessment & Plan Note (Signed)
Blood pressure control is good; current medications will be continued. 

## 2011-06-09 NOTE — Assessment & Plan Note (Signed)
TSH-9.1 at the last assessment available to me in 10/2009.  Dr. Margo Aye is managing treatment of hypothyroidism.

## 2011-06-09 NOTE — Assessment & Plan Note (Signed)
Patient is congratulated on a 20+ pound weight loss and encouraged to continue.

## 2011-06-09 NOTE — Progress Notes (Signed)
Patient ID: [name], female   DOB: 05/11/1946, 66 y.o.   MRN: [id number] HPI: Scheduled return visit for this very nice woman who is followed for coronary artery disease and management of cardiovascular risk factors.  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  Unfortunately, the procedure was not successful, and she did not wish to proceed with a 2nd surgery as recommended by the ophthalmologist.  Otherwise, she's done quite well.  She has not required hospitalization nor urgent medical attention.  Control of blood pressure has been good.  She has been restricting caloric intake and has lost some weight.  She denies dyspnea, orthopnea, PND, pedal edema, chest discomfort, palpitations, lightheadedness or syncope.  Prior to Admission medications   Medication Sig Start Date End Date Taking? Authorizing Provider  amitriptyline (ELAVIL) 25 MG tablet Take 25 mg by mouth at bedtime.     Yes Historical Provider, MD  amLODipine (NORVASC) 10 MG tablet Take 10 mg by mouth daily.     Yes Historical Provider, MD  aspirin 325 MG tablet Take 325 mg by mouth daily.     Yes Historical Provider, MD  diazepam (VALIUM) 5 MG tablet Take 5 mg by mouth every 6 (six) hours as needed.     Yes Historical Provider, MD  diclofenac (VOLTAREN) 75 MG EC tablet Take 75 mg by mouth 2 (two) times daily.   Yes Historical Provider, MD  fish oil-omega-3 fatty acids 1000 MG capsule Take 2 g by mouth daily.     Yes Historical Provider, MD  levothyroxine (SYNTHROID, LEVOTHROID) 75 MCG tablet Take 75 mcg by mouth daily.     Yes Historical Provider, MD  losartan-hydrochlorothiazide (HYZAAR) 100-12.5 MG per tablet Take 1 tablet by mouth daily.     Yes Historical Provider, MD  metoprolol tartrate (LOPRESSOR) 25 MG tablet Take 25 mg by mouth 3 (three) times daily.    Yes Historical Provider, MD  pantoprazole (PROTONIX) 40 MG tablet TAKE ONE TABLET BY MOUTH  TWICE DAILY 04/12/11  Yes Lorenza Burton, NP  atorvastatin (LIPITOR) 40 MG tablet Take 1 tablet (40 mg total) by mouth daily. 06/09/11 06/08/12  Gerrit Friends. Dietrich Pates, MD    Allergies  Allergen Reactions  . Codeine   . Dicyclomine Hcl       Past medical history, social history, and family history reviewed and updated.  ROS: See history of present illness.  PHYSICAL EXAM: BP 133/78  Pulse 59  Resp 16  Ht 5\' 1"  (1.549 m)  Wt 92.08 kg (203 lb)  BMI 38.36 kg/m2  General-Well developed; no acute distress Body habitus-obese Neck-No JVD; no carotid bruits Lungs-clear lung fields; resonant to percussion Cardiovascular-normal PMI; normal S1 and S2; minimal systolic ejection murmur Abdomen-normal bowel sounds; soft and non-tender without masses or organomegaly Musculoskeletal-No deformities, no cyanosis or clubbing Neurologic-Normal cranial nerves; symmetric strength and tone Skin-Warm, no significant lesions Extremities-distal pulses intact; no edema  EKG:  Sinus bradycardia at a rate of 59 bpm, ST-T wave abnormalities-consider lateral ischemia or LVH.  No significant change when compared with a previous tracing performed 04/01/2010.  ASSESSMENT AND PLAN:  Pixley Bing, MD 06/09/2011 5:59 PM

## 2011-06-10 ENCOUNTER — Encounter: Payer: Self-pay | Admitting: Cardiology

## 2011-07-08 ENCOUNTER — Encounter: Payer: Self-pay | Admitting: Cardiology

## 2011-07-21 ENCOUNTER — Other Ambulatory Visit: Payer: Self-pay | Admitting: *Deleted

## 2011-07-21 DIAGNOSIS — E782 Mixed hyperlipidemia: Secondary | ICD-10-CM

## 2011-07-29 ENCOUNTER — Telehealth: Payer: Self-pay | Admitting: *Deleted

## 2011-07-29 DIAGNOSIS — E782 Mixed hyperlipidemia: Secondary | ICD-10-CM

## 2011-08-04 ENCOUNTER — Other Ambulatory Visit: Payer: Self-pay | Admitting: Cardiology

## 2011-08-04 DIAGNOSIS — E782 Mixed hyperlipidemia: Secondary | ICD-10-CM | POA: Diagnosis not present

## 2011-08-05 ENCOUNTER — Encounter: Payer: Self-pay | Admitting: *Deleted

## 2011-08-05 LAB — LIPID PANEL
LDL Cholesterol: 61 mg/dL (ref 0–99)
Total CHOL/HDL Ratio: 3.4 Ratio
VLDL: 26 mg/dL (ref 0–40)

## 2011-09-29 ENCOUNTER — Encounter: Payer: Self-pay | Admitting: Internal Medicine

## 2011-10-05 ENCOUNTER — Encounter: Payer: Self-pay | Admitting: Gastroenterology

## 2011-10-05 ENCOUNTER — Ambulatory Visit (INDEPENDENT_AMBULATORY_CARE_PROVIDER_SITE_OTHER): Payer: Medicare Other | Admitting: Gastroenterology

## 2011-10-05 VITALS — BP 118/82 | HR 80 | Temp 97.8°F | Ht 61.5 in | Wt 205.8 lb

## 2011-10-05 DIAGNOSIS — R1031 Right lower quadrant pain: Secondary | ICD-10-CM | POA: Insufficient documentation

## 2011-10-05 DIAGNOSIS — R1030 Lower abdominal pain, unspecified: Secondary | ICD-10-CM | POA: Insufficient documentation

## 2011-10-05 DIAGNOSIS — K219 Gastro-esophageal reflux disease without esophagitis: Secondary | ICD-10-CM | POA: Diagnosis not present

## 2011-10-05 LAB — CBC WITH DIFFERENTIAL/PLATELET
Eosinophils Absolute: 0.2 10*3/uL (ref 0.0–0.7)
Lymphocytes Relative: 47 % — ABNORMAL HIGH (ref 12–46)
Lymphs Abs: 2 10*3/uL (ref 0.7–4.0)
MCH: 29.5 pg (ref 26.0–34.0)
Neutro Abs: 1.8 10*3/uL (ref 1.7–7.7)
Neutrophils Relative %: 42 % — ABNORMAL LOW (ref 43–77)
Platelets: 242 10*3/uL (ref 150–400)
RBC: 3.93 MIL/uL (ref 3.87–5.11)
WBC: 4.3 10*3/uL (ref 4.0–10.5)

## 2011-10-05 LAB — COMPREHENSIVE METABOLIC PANEL
ALT: 41 U/L — ABNORMAL HIGH (ref 0–35)
CO2: 29 mEq/L (ref 19–32)
Sodium: 137 mEq/L (ref 135–145)
Total Bilirubin: 0.4 mg/dL (ref 0.3–1.2)
Total Protein: 8.9 g/dL — ABNORMAL HIGH (ref 6.0–8.3)

## 2011-10-05 NOTE — Progress Notes (Signed)
Faxed to PCP

## 2011-10-05 NOTE — Assessment & Plan Note (Signed)
Continue dexilant 

## 2011-10-05 NOTE — Progress Notes (Signed)
Primary Care Physician: Dwana Melena, MD, MD  Primary Gastroenterologist:  Roetta Sessions, MD   Chief Complaint  Patient presents with  . Follow-up  . Abdominal Pain    RLQ    HPI: [name] is a 66 y.o. female here for further evaluation of right lower quadrant abdominal pain. Last seen May 2012. 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 She continues to have alternating constipation and diarrhea. She may go three or for days without BM and then develop diarrhea. Tends to have more constipation. Stool softner daily. No melena, brbpr. Main reason for visit is due to PP abdominal discomfort. As she is eating she describes having pain in the left upper quadrant which goes down to the left lower abdomen. She states at that point the pain crosses the right lower quadrant it is crampy in quality. It radiates into her back. Going on for several months. Last time last week. No n/v. No heartburn or indigestion. Left sided abd pain better with BM. Right-sided abdominal pain not affected by bowel movements. Denies unintentional weight loss. She has lost 7 pounds since her last visit with dietary changes.  Current Outpatient Prescriptions  Medication Sig Dispense Refill  . amitriptyline (ELAVIL) 25 MG tablet Take 25 mg by mouth at bedtime.        Marland Kitchen aspirin 325 MG tablet Take 325 mg by mouth daily.        Marland Kitchen atorvastatin (LIPITOR) 40 MG tablet Take 1 tablet (40 mg total) by mouth daily.  30 tablet  11  . diclofenac (VOLTAREN) 75 MG EC tablet Take 75 mg by mouth 2 (two) times daily.      . fish oil-omega-3 fatty acids 1000 MG capsule Take 2 g by mouth daily.        Marland Kitchen levothyroxine (SYNTHROID, LEVOTHROID) 75 MCG tablet Take 75 mcg by mouth daily.        Marland Kitchen losartan-hydrochlorothiazide (HYZAAR) 100-12.5 MG per tablet Take 1 tablet by mouth daily.        . metoprolol tartrate (LOPRESSOR) 25 MG tablet Take 25 mg by mouth 3 (three) times daily.        Marland Kitchen oxyCODONE-acetaminophen (PERCOCET) 5-325 MG per tablet Take 1 tablet by mouth every 4 (four) hours as needed.      . pantoprazole (PROTONIX) 40 MG tablet TAKE ONE TABLET BY MOUTH TWICE DAILY  62 tablet  5  . Probiotic Product (ACIDOPHILUS HIGH-POTENCY PO) Take by mouth.        Allergies as of 10/05/2011 - Review Complete 10/05/2011  Allergen Reaction Noted  . Codeine    . Dicyclomine hcl      ROS:  General: Negative for anorexia, unintentional weight loss, fever, chills, fatigue, weakness. ENT: Negative for hoarseness, difficulty swallowing , nasal congestion. CV: Negative for chest pain, angina, palpitations, dyspnea on exertion, peripheral edema.  Respiratory: Negative for dyspnea at rest, dyspnea on exertion, cough, sputum, wheezing.  GI: See history of present illness. GU:  Negative for dysuria, hematuria, urinary incontinence, urinary frequency, nocturnal urination.  Endo: Negative for unusual weight change.    Physical Examination:   BP 118/82  Pulse 80  Temp(Src) 97.8 F (36.6 C) (Temporal)  Ht 5' 1.5" (1.562 m)  Wt 205 lb 12.8 oz (93.35 kg)  BMI 38.26 kg/m2  General: Well-nourished, well-developed in no acute distress.  Eyes: No icterus. Mouth: Oropharyngeal mucosa moist and pink , no lesions erythema or exudate. Lungs: Clear to auscultation  bilaterally.  Heart: Regular rate and rhythm, no murmurs rubs or gallops.  Abdomen: Bowel sounds are normal, moderate tenderness in the right mid to right lower quadrant. No rebound tenderness. nondistended, no hepatosplenomegaly or masses, no abdominal bruits or hernia , no rebound or guarding.   Extremities: No lower extremity edema. No clubbing or deformities. Neuro: Alert and oriented x 4   Skin: Warm and dry, no jaundice.   Psych: Alert and cooperative, normal mood and affect.

## 2011-10-05 NOTE — Assessment & Plan Note (Signed)
Several month h/o abdominal pain. Mostly on R mid to RLQ. Continues to have alternating constipation and diarrhea. Need to r/o chronic appendicitis. Location not typical of gallbladder. Check labs, including CBC with diff, cmet. CT a/p with iv/oral contrast.

## 2011-10-06 ENCOUNTER — Ambulatory Visit (HOSPITAL_COMMUNITY)
Admission: RE | Admit: 2011-10-06 | Discharge: 2011-10-06 | Disposition: A | Discharge status: Home / Self Care | Payer: Medicare Other | Source: Ambulatory Visit | Attending: Gastroenterology | Admitting: Gastroenterology

## 2011-10-06 DIAGNOSIS — K5730 Diverticulosis of large intestine without perforation or abscess without bleeding: Secondary | ICD-10-CM | POA: Insufficient documentation

## 2011-10-06 DIAGNOSIS — R1031 Right lower quadrant pain: Secondary | ICD-10-CM | POA: Insufficient documentation

## 2011-10-06 MED ORDER — IOHEXOL 300 MG/ML  SOLN
100.0000 mL | Freq: Once | INTRAMUSCULAR | Status: AC | PRN
  Administered 2011-10-06: 100 mL via INTRAVENOUS

## 2011-10-08 NOTE — Progress Notes (Signed)
Quick Note:  Mild anemia but HCT normal. Mild hypokalemia. AST/ALT up (?secondary to lipitor).  CT-->liver okay, nothing to explain abd pain.  Recommendations: 1. Iron, tibc, ferritin, hep b surface antigen, hep c antibody (diagnosis of anemia, abnormal lfts) 2. ifobt 3. Add miralax 17g po at bedtime on days she has not had BM (to see if improved bowel functions has any affect on abd pain). 4. Increase potassium containing foods for mild hypokalemia. Examples: nuts, peanut butter, bananas, lemon, oranges/orange juice, pears, dried fruits, broccoli, potatoes, spinach, tomato, beans. NEEDS TO FOLLOW UP WITH PCP FOR CHRONIC MANAGEMENT.  ______

## 2011-10-08 NOTE — Progress Notes (Signed)
Quick Note:  See result note under labs for same day. ______

## 2011-10-11 ENCOUNTER — Other Ambulatory Visit: Payer: Self-pay

## 2011-10-11 DIAGNOSIS — R945 Abnormal results of liver function studies: Secondary | ICD-10-CM

## 2011-10-11 DIAGNOSIS — D649 Anemia, unspecified: Secondary | ICD-10-CM | POA: Diagnosis not present

## 2011-10-14 ENCOUNTER — Other Ambulatory Visit: Payer: Self-pay

## 2011-10-14 MED ORDER — PANTOPRAZOLE SODIUM 40 MG PO TBEC: 40.0000 mg | DELAYED_RELEASE_TABLET | Freq: Two times a day (BID) | ORAL | Status: DC

## 2011-10-15 ENCOUNTER — Ambulatory Visit (INDEPENDENT_AMBULATORY_CARE_PROVIDER_SITE_OTHER): Payer: Medicare Other | Admitting: Urgent Care

## 2011-10-15 DIAGNOSIS — R7989 Other specified abnormal findings of blood chemistry: Secondary | ICD-10-CM | POA: Diagnosis not present

## 2011-10-15 DIAGNOSIS — R1031 Right lower quadrant pain: Secondary | ICD-10-CM | POA: Diagnosis not present

## 2011-10-15 DIAGNOSIS — D649 Anemia, unspecified: Secondary | ICD-10-CM | POA: Diagnosis not present

## 2011-10-15 LAB — IRON AND TIBC: %SAT: 19 % — ABNORMAL LOW (ref 20–55)

## 2011-10-15 LAB — FERRITIN: Ferritin: 54 ng/mL (ref 10–291)

## 2011-10-15 LAB — IFOBT (OCCULT BLOOD): IFOBT: NEGATIVE

## 2011-10-15 LAB — HEPATITIS B SURFACE ANTIGEN: Hepatitis B Surface Ag: NEGATIVE

## 2011-10-16 LAB — HEPATITIS C ANTIBODY
HCV Ab: NEGATIVE
Hepatitis C Ab: NEGATIVE

## 2011-10-27 NOTE — Progress Notes (Signed)
Quick Note:  Looks like lab cancelled Hep C ab. Please find out why they did this.  Let's have patient repeat LFTs, ferritin, CBC w/diff, HCV Ab in 8 weeks.  Her ifobt was negative. No evidence of IDA. How is she doing on Miralax?  ______

## 2011-10-28 ENCOUNTER — Other Ambulatory Visit: Payer: Self-pay | Admitting: Gastroenterology

## 2011-10-28 ENCOUNTER — Other Ambulatory Visit: Payer: Self-pay

## 2011-10-28 DIAGNOSIS — R945 Abnormal results of liver function studies: Secondary | ICD-10-CM

## 2011-10-28 DIAGNOSIS — D649 Anemia, unspecified: Secondary | ICD-10-CM

## 2011-11-15 ENCOUNTER — Telehealth: Payer: Self-pay | Admitting: Gastroenterology

## 2011-11-15 NOTE — Telephone Encounter (Signed)
Last note says pt on dexilant.  Is she taking dexilant or protonix BID?

## 2011-11-16 NOTE — Telephone Encounter (Signed)
Tried to call pt- call would not go thru. 

## 2011-11-16 NOTE — Telephone Encounter (Signed)
Tried to call pt- NA 

## 2011-11-17 ENCOUNTER — Encounter: Payer: Self-pay | Admitting: Urgent Care

## 2011-11-17 ENCOUNTER — Other Ambulatory Visit: Payer: Self-pay

## 2011-11-17 MED ORDER — PANTOPRAZOLE SODIUM 40 MG PO TBEC: 40.0000 mg | DELAYED_RELEASE_TABLET | Freq: Two times a day (BID) | ORAL | Status: DC

## 2011-11-17 NOTE — Telephone Encounter (Signed)
Done

## 2011-11-17 NOTE — Telephone Encounter (Signed)
Pt called back, the protonix is working good and that is what she wants the refill on.

## 2012-02-04 DIAGNOSIS — Z23 Encounter for immunization: Secondary | ICD-10-CM | POA: Diagnosis not present

## 2012-02-04 DIAGNOSIS — T148XXA Other injury of unspecified body region, initial encounter: Secondary | ICD-10-CM | POA: Diagnosis not present

## 2012-02-04 DIAGNOSIS — W540XXA Bitten by dog, initial encounter: Secondary | ICD-10-CM | POA: Diagnosis not present

## 2012-04-10 ENCOUNTER — Emergency Department (HOSPITAL_COMMUNITY)
Admission: EM | Admit: 2012-04-10 | Discharge: 2012-04-11 | Disposition: A | Discharge status: Home / Self Care | Payer: Medicare Other | Attending: Emergency Medicine | Admitting: Emergency Medicine

## 2012-04-10 ENCOUNTER — Emergency Department (HOSPITAL_COMMUNITY): Payer: Medicare Other

## 2012-04-10 ENCOUNTER — Encounter (HOSPITAL_COMMUNITY): Payer: Self-pay | Admitting: *Deleted

## 2012-04-10 DIAGNOSIS — R1110 Vomiting, unspecified: Secondary | ICD-10-CM | POA: Diagnosis not present

## 2012-04-10 DIAGNOSIS — J189 Pneumonia, unspecified organism: Secondary | ICD-10-CM | POA: Insufficient documentation

## 2012-04-10 DIAGNOSIS — E669 Obesity, unspecified: Secondary | ICD-10-CM | POA: Diagnosis not present

## 2012-04-10 DIAGNOSIS — I2510 Atherosclerotic heart disease of native coronary artery without angina pectoris: Secondary | ICD-10-CM | POA: Insufficient documentation

## 2012-04-10 DIAGNOSIS — F411 Generalized anxiety disorder: Secondary | ICD-10-CM | POA: Insufficient documentation

## 2012-04-10 DIAGNOSIS — E039 Hypothyroidism, unspecified: Secondary | ICD-10-CM | POA: Diagnosis not present

## 2012-04-10 DIAGNOSIS — Z87891 Personal history of nicotine dependence: Secondary | ICD-10-CM | POA: Insufficient documentation

## 2012-04-10 DIAGNOSIS — H543 Unqualified visual loss, both eyes: Secondary | ICD-10-CM | POA: Diagnosis not present

## 2012-04-10 DIAGNOSIS — K219 Gastro-esophageal reflux disease without esophagitis: Secondary | ICD-10-CM | POA: Insufficient documentation

## 2012-04-10 DIAGNOSIS — Z8719 Personal history of other diseases of the digestive system: Secondary | ICD-10-CM | POA: Diagnosis not present

## 2012-04-10 DIAGNOSIS — I10 Essential (primary) hypertension: Secondary | ICD-10-CM | POA: Insufficient documentation

## 2012-04-10 DIAGNOSIS — Z79899 Other long term (current) drug therapy: Secondary | ICD-10-CM | POA: Diagnosis not present

## 2012-04-10 DIAGNOSIS — E785 Hyperlipidemia, unspecified: Secondary | ICD-10-CM | POA: Diagnosis not present

## 2012-04-10 MED ORDER — DEXTROSE 5 % IV SOLN
500.0000 mg | INTRAVENOUS | Status: DC
  Filled 2012-04-10: qty 500

## 2012-04-10 MED ORDER — ONDANSETRON HCL 8 MG PO TABS: 8.0000 mg | ORAL_TABLET | Freq: Three times a day (TID) | ORAL | Status: DC | PRN

## 2012-04-10 MED ORDER — AZITHROMYCIN 250 MG PO TABS: ORAL_TABLET | ORAL | Status: DC

## 2012-04-10 MED ORDER — AMOXICILLIN 500 MG PO CAPS: 1000.0000 mg | ORAL_CAPSULE | Freq: Three times a day (TID) | ORAL | Status: DC

## 2012-04-10 MED ORDER — SODIUM CHLORIDE 0.9 % IV BOLUS (SEPSIS)
500.0000 mL | Freq: Once | INTRAVENOUS | Status: AC
  Administered 2012-04-10: 23:00:00 via INTRAVENOUS

## 2012-04-10 MED ORDER — DEXTROSE 5 % IV SOLN
1.0000 g | INTRAVENOUS | Status: DC
  Filled 2012-04-10: qty 10
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
## 2012-04-10 NOTE — ED Notes (Signed)
Pt with cough and body aches, fever since last Tuesday

## 2012-04-10 NOTE — ED Notes (Signed)
Also c/o emesis and not sleeping at night

## 2012-04-26 ENCOUNTER — Telehealth: Payer: Self-pay | Admitting: Gastroenterology

## 2012-04-26 DIAGNOSIS — R945 Abnormal results of liver function studies: Secondary | ICD-10-CM

## 2012-04-26 DIAGNOSIS — D649 Anemia, unspecified: Secondary | ICD-10-CM

## 2012-04-26 NOTE — Telephone Encounter (Signed)
Lab cancelled orders so I put in new set.

## 2012-04-26 NOTE — Telephone Encounter (Signed)
Mailed letter and copy of lab orders to pt. Checked [facility] lab website to see if labs were done but not in epic. No lab results in epic since May 2013.

## 2012-04-26 NOTE — Telephone Encounter (Signed)
Patient is overdue for her CBC, LFTs, ferritin. The lab cancelled her orders.   Let's request patient had labs done.

## 2012-05-08 ENCOUNTER — Other Ambulatory Visit: Payer: Self-pay | Admitting: Gastroenterology

## 2012-05-10 ENCOUNTER — Other Ambulatory Visit: Payer: Self-pay

## 2012-05-10 MED ORDER — PANTOPRAZOLE SODIUM 40 MG PO TBEC: 40.0000 mg | DELAYED_RELEASE_TABLET | Freq: Every day | ORAL | Status: DC

## 2012-05-11 ENCOUNTER — Telehealth: Payer: Self-pay | Admitting: *Deleted

## 2012-05-11 MED ORDER — PANTOPRAZOLE SODIUM 40 MG PO TBEC: 40.0000 mg | DELAYED_RELEASE_TABLET | Freq: Two times a day (BID) | ORAL | Status: DC

## 2012-05-11 NOTE — Telephone Encounter (Signed)
Changed to BID. She needs nonurgent OV with RMR only. Has not seen him in years.

## 2012-05-11 NOTE — Telephone Encounter (Signed)
Mr Wisner called today. She picked up her new prescription of Protonix and it was the wrong dosage. She was taking 2 pills bid and this prescription is for 1 pill daily. Please follow up. Thanks.

## 2012-05-11 NOTE — Telephone Encounter (Signed)
Pt aware of new Rx and her follow up appointment on January 31 @ 9:30

## 2012-05-11 NOTE — Telephone Encounter (Signed)
Routing to refill box  

## 2012-05-11 NOTE — Addendum Note (Signed)
Addended by: Tiffany Kocher on: 05/11/2012 02:59 PM   Modules accepted: Orders

## 2012-05-29 ENCOUNTER — Other Ambulatory Visit: Payer: Self-pay | Admitting: *Deleted

## 2012-05-29 MED ORDER — ATORVASTATIN CALCIUM 40 MG PO TABS: 40.0000 mg | ORAL_TABLET | Freq: Every evening | ORAL | Status: DC

## 2012-06-01 ENCOUNTER — Other Ambulatory Visit: Payer: Self-pay | Admitting: Gastroenterology

## 2012-06-01 DIAGNOSIS — R7989 Other specified abnormal findings of blood chemistry: Secondary | ICD-10-CM | POA: Diagnosis not present

## 2012-06-01 DIAGNOSIS — D649 Anemia, unspecified: Secondary | ICD-10-CM | POA: Diagnosis not present

## 2012-06-01 DIAGNOSIS — R1031 Right lower quadrant pain: Secondary | ICD-10-CM | POA: Diagnosis not present

## 2012-06-02 LAB — CBC WITH DIFFERENTIAL/PLATELET
Basophils Relative: 1 % (ref 0–1)
Eosinophils Relative: 4 % (ref 0–5)
HCT: 35.4 % — ABNORMAL LOW (ref 36.0–46.0)
Hemoglobin: 11.7 g/dL — ABNORMAL LOW (ref 12.0–15.0)
MCH: 29.8 pg (ref 26.0–34.0)
MCHC: 33.1 g/dL (ref 30.0–36.0)
MCV: 90.1 fL (ref 78.0–100.0)
Monocytes Absolute: 0.3 10*3/uL (ref 0.1–1.0)
Monocytes Relative: 7 % (ref 3–12)
Neutro Abs: 1.9 10*3/uL (ref 1.7–7.7)

## 2012-06-02 LAB — HEPATIC FUNCTION PANEL
AST: 32 U/L (ref 0–37)
Albumin: 3.8 g/dL (ref 3.5–5.2)
Alkaline Phosphatase: 73 U/L (ref 39–117)
Bilirubin, Direct: 0.1 mg/dL (ref 0.0–0.3)
Indirect Bilirubin: 0.3 mg/dL (ref 0.0–0.9)
Total Bilirubin: 0.4 mg/dL (ref 0.3–1.2)

## 2012-06-02 LAB — IRON: Iron: 88 ug/dL (ref 42–145)

## 2012-06-02 NOTE — Progress Notes (Signed)
Quick Note:  Iron improved, ferritin slightly down but still normal, CBC about the same. Let's get another ifobt. Needs to have completed before OV with RMR 1/31. ______

## 2012-06-02 NOTE — Progress Notes (Signed)
Quick Note:  Normalized LFTs. ?elevation in past secondary to statin. ______

## 2012-06-12 ENCOUNTER — Ambulatory Visit (INDEPENDENT_AMBULATORY_CARE_PROVIDER_SITE_OTHER): Payer: Medicare Other | Admitting: Gastroenterology

## 2012-06-12 DIAGNOSIS — R1031 Right lower quadrant pain: Secondary | ICD-10-CM | POA: Diagnosis not present

## 2012-06-13 NOTE — Progress Notes (Signed)
Quick Note:  Pt is aware.  Dawn, please cc pcp ______ 

## 2012-06-13 NOTE — Progress Notes (Signed)
Faxed to PCP

## 2012-06-13 NOTE — Progress Notes (Signed)
Quick Note:  Please let pt know no blood in her stool. ZO:XWRU,EAVW, MD  ______

## 2012-06-30 ENCOUNTER — Ambulatory Visit: Payer: Medicare Other | Admitting: Internal Medicine

## 2012-07-25 ENCOUNTER — Other Ambulatory Visit: Payer: Self-pay | Admitting: Cardiology

## 2012-07-26 LAB — LIPID PANEL
HDL: 38 mg/dL — ABNORMAL LOW (ref >39)
LDL Cholesterol: 59 mg/dL (ref 0–99)
Total CHOL/HDL Ratio: 3.1 Ratio
Triglycerides: 96 mg/dL (ref <150)

## 2012-07-27 ENCOUNTER — Ambulatory Visit: Payer: Medicare Other | Admitting: Cardiology

## 2012-07-27 ENCOUNTER — Encounter: Payer: Self-pay | Admitting: Cardiology

## 2012-07-28 ENCOUNTER — Ambulatory Visit: Payer: Medicare Other | Admitting: Cardiology

## 2012-07-31 ENCOUNTER — Ambulatory Visit: Payer: Medicare Other | Admitting: Cardiology

## 2012-08-03 ENCOUNTER — Encounter: Payer: Self-pay | Admitting: Internal Medicine

## 2012-08-07 ENCOUNTER — Encounter: Payer: Self-pay | Admitting: Gastroenterology

## 2012-08-07 ENCOUNTER — Ambulatory Visit (INDEPENDENT_AMBULATORY_CARE_PROVIDER_SITE_OTHER): Payer: Medicare Other | Admitting: Adult Health

## 2012-08-07 ENCOUNTER — Ambulatory Visit (INDEPENDENT_AMBULATORY_CARE_PROVIDER_SITE_OTHER): Payer: Medicare Other | Admitting: Gastroenterology

## 2012-08-07 ENCOUNTER — Encounter: Payer: Self-pay | Admitting: Adult Health

## 2012-08-07 VITALS — BP 110/68 | HR 56 | Ht 61.0 in | Wt 200.1 lb

## 2012-08-07 VITALS — BP 110/68 | HR 75 | Temp 98.2°F | Ht 61.5 in | Wt 199.2 lb

## 2012-08-07 DIAGNOSIS — K219 Gastro-esophageal reflux disease without esophagitis: Secondary | ICD-10-CM

## 2012-08-07 DIAGNOSIS — E785 Hyperlipidemia, unspecified: Secondary | ICD-10-CM | POA: Diagnosis not present

## 2012-08-07 MED ORDER — DICYCLOMINE HCL 10 MG PO CAPS: 10.0000 mg | ORAL_CAPSULE | Freq: Three times a day (TID) | ORAL | Status: DC

## 2012-08-07 MED ORDER — DEXLANSOPRAZOLE 60 MG PO CPDR: 60.0000 mg | DELAYED_RELEASE_CAPSULE | Freq: Every day | ORAL | Status: DC

## 2012-08-07 MED ORDER — METOPROLOL SUCCINATE ER 50 MG PO TB24: 50.0000 mg | ORAL_TABLET | Freq: Every day | ORAL | Status: DC

## 2012-08-07 NOTE — Patient Instructions (Addendum)
Start Metoprolol Succ( Long Acting ) 50 mg daily if too expensive decrease metoprolol tart ( Short Acting ) 25 mg twice a day   Blood Pressure Check in 1 week
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
## 2012-08-07 NOTE — Assessment & Plan Note (Signed)
She is without cardiac complaints today she denies any chest pain or shortness of breath. She does complain of generalized fatigue which she has attributed to her age. I am noticing that her heart rate is in the 50s, but she also states that sometimes it is down into the 30s and 40s at home. She is having some lightheadedness with position change. I will change metoprolol to a total of 50 mg daily from 75 mg daily. Followup blood pressure and heart rate check will be completed one week post medication changes.

## 2012-08-07 NOTE — Assessment & Plan Note (Signed)
Blood pressure is alert today at 110/68. She states that is lower than normal he has been. She has lost approximately 15 pounds since being seen last period which may be contributing to her blood pressure being lower. I have decreased her metoprolol dose in the setting of bradycardia. Followup blood pressure heart rate check will be completed in one week.
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
## 2012-08-07 NOTE — Assessment & Plan Note (Signed)
She is followed by Dr. Margo Aye for ongoing labs and assessment of this.

## 2012-08-07 NOTE — Patient Instructions (Addendum)
For reflux: Stop Protonix. Start taking Dexilant each day. We have provided samples and sent a prescription to the pharmacy. Review the reflux diet attached.  For irritable bowel: Start taking Bentyl twice a day with breakfast and supper. This can be taken up to 4 times a day. This helps with loose stools and cramping. Watch for constipation, as this can worsen it. If you are constipated, take a dose of Miralax that evening. It is ok if you take this more than one day at a time.  We will see you back in 3 months!  Diet for Gastroesophageal Reflux Disease, Adult Reflux (acid reflux) is when acid from your stomach flows up into the esophagus. When acid comes in contact with the esophagus, the acid causes irritation and soreness (inflammation) in the esophagus. When reflux happens often or so severely that it causes damage to the esophagus, it is called gastroesophageal reflux disease (GERD). Nutrition therapy can help ease the discomfort of GERD. FOODS OR DRINKS TO AVOID OR LIMIT  Smoking or chewing tobacco. Nicotine is one of the most potent stimulants to acid production in the gastrointestinal tract.  Caffeinated and decaffeinated coffee and black tea.  Regular or low-calorie carbonated beverages or energy drinks (caffeine-free carbonated beverages are allowed).   Strong spices, such as black pepper, white pepper, red pepper, cayenne, curry powder, and chili powder.  Peppermint or spearmint.  Chocolate.  High-fat foods, including meats and fried foods. Extra added fats including oils, butter, salad dressings, and nuts. Limit these to less than 8 tsp per day.  Fruits and vegetables if they are not tolerated, such as citrus fruits or tomatoes.  Alcohol.  Any food that seems to aggravate your condition. If you have questions regarding your diet, call your caregiver or a registered dietitian. OTHER THINGS THAT MAY HELP GERD INCLUDE:   Eating your meals slowly, in a relaxed  setting.  Eating 5 to 6 small meals per day instead of 3 large meals.  Eliminating food for a period of time if it causes distress.  Not lying down until 3 hours after eating a meal.  Keeping the head of your bed raised 6 to 9 inches (15 to 23 cm) by using a foam wedge or blocks under the legs of the bed. Lying flat may make symptoms worse.  Being physically active. Weight loss may be helpful in reducing reflux in overweight or obese adults.  Wear loose fitting clothing EXAMPLE MEAL PLAN This meal plan is approximately 2,000 calories based on https://www.[facility].org/ meal planning guidelines. Breakfast   cup cooked oatmeal.  1 cup strawberries.  1 cup low-fat milk.  1 oz almonds. Snack  1 cup cucumber slices.  6 oz yogurt (made from low-fat or fat-free milk). Lunch  2 slice whole-wheat bread.  2 oz sliced [place].  2 tsp mayonnaise.  1 cup blueberries.  1 cup snap peas. Snack  6 whole-wheat crackers.  1 oz string cheese. Dinner   cup brown rice.  1 cup mixed veggies.  1 tsp olive oil.  3 oz grilled fish. Document Released: 05/17/2005 Document Revised: 08/09/2011 Document Reviewed: 04/02/2011 [facility] Patient Information 2013 [place], [place].

## 2012-08-08 ENCOUNTER — Telehealth: Payer: Self-pay

## 2012-08-08 NOTE — Progress Notes (Signed)
Faxed to PCP

## 2012-08-08 NOTE — Assessment & Plan Note (Signed)
Nocturnal reflux noted. Likely combination of lifestyle/behavior. Current regimen of Protonix BID. Tried and failed Prilosec and Nexium. Trial Dexilant. No warning signs noted. Applauded on weight loss of 11 pounds. Review reflux diet. 3 months f/u.

## 2012-08-08 NOTE — Telephone Encounter (Signed)
pts husband came by the office. He stated pt was having some problems with the dexilant samples we have her. He stated she was having dry mouth and was "up all night" and "was running to the bathroom all night". He couldn't give me any more details than this and asked me to call the pt. Called pt at [phone] and [facility] to get more information.

## 2012-08-08 NOTE — Assessment & Plan Note (Signed)
Mixed. Start Miralax for constipation, Bentyl BID for cramping. Discussed side effects of Bentyl. Last TCS in 2007, heme negative recently. Avoid dairy products. 3 month f/u.

## 2012-08-08 NOTE — Telephone Encounter (Signed)
I gave her Dexilant and Bentyl. Dexilant could possibly have caused loose stools, but she has a history of IBS as well.  Likely the Bentyl is what gave her the dry mouth, if she took it.   She can leave off the Bentyl and see how that does with just taking Dexilant. Or, she could stop both and just resume Protonix BID. It is hard to tell which one caused what. Bentyl usually causes constipation and dry mouth, not diarrhea.

## 2012-08-09 NOTE — Telephone Encounter (Signed)
Tried to call pt number has been disconnected

## 2012-08-10 NOTE — Telephone Encounter (Signed)
Yes, we can try Zegerid.

## 2012-08-10 NOTE — Telephone Encounter (Signed)
Pt called this morning- she said the dexilant was too strong for her, her mouth was so dry she was up gagging all night. She would like to try something else because the protonix isnt strong enough and she would like to try samples first. Pt has tried nexium and prilosec and the only other samples we have is zegerid. Please advise.

## 2012-08-11 NOTE — Telephone Encounter (Signed)
Pt aware, husband has picked up 3 bottles of zegerid and will let us know how she does.

## 2012-08-16 ENCOUNTER — Ambulatory Visit (INDEPENDENT_AMBULATORY_CARE_PROVIDER_SITE_OTHER): Payer: Medicare Other | Admitting: *Deleted

## 2012-08-16 VITALS — BP 110/80 | HR 57 | Wt 197.0 lb

## 2012-08-16 DIAGNOSIS — I959 Hypotension, unspecified: Secondary | ICD-10-CM | POA: Diagnosis not present

## 2012-08-16 NOTE — Progress Notes (Signed)
Great.  No changes.

## 2012-08-16 NOTE — Progress Notes (Signed)
Presents for blood pressure check, post OV on 08/07/12, where she was changed from Lopressor to Toprol XL.  States feeling much better since the change was made.  Denies any complaints.

## 2012-08-21 ENCOUNTER — Telehealth: Payer: Self-pay | Admitting: Gastroenterology

## 2012-08-21 MED ORDER — OMEPRAZOLE-SODIUM BICARBONATE 40-1100 MG PO CAPS: 1.0000 | ORAL_CAPSULE | Freq: Every day | ORAL | Status: DC

## 2012-08-21 NOTE — Telephone Encounter (Signed)
Can we find out what she is referring to?  I started her on Dexilant at her last visit.

## 2012-08-21 NOTE — Telephone Encounter (Signed)
Patient wants a Rx Cegerid called to [facility] in [place]

## 2012-08-21 NOTE — Addendum Note (Signed)
Addended by: Nira Retort on: 08/21/2012 05:00 PM   Modules accepted: Orders

## 2012-08-21 NOTE — Telephone Encounter (Signed)
Zegerid provided.

## 2012-08-21 NOTE — Telephone Encounter (Signed)
Pt was given zegerid samples per 08/08/12 telephone note. She stated the dexilant was too strong for her.

## 2012-08-22 ENCOUNTER — Other Ambulatory Visit: Payer: Self-pay

## 2012-08-23 ENCOUNTER — Ambulatory Visit: Payer: Medicare Other | Admitting: Internal Medicine

## 2012-08-23 MED ORDER — OMEPRAZOLE-SODIUM BICARBONATE 40-1100 MG PO CAPS: 1.0000 | ORAL_CAPSULE | Freq: Every day | ORAL | Status: DC

## 2012-09-05 NOTE — Progress Notes (Signed)
Patient ID: [name], female   DOB: February 03, 1946, 67 y.o.   MRN: [id number]  Patient is doing well. Continue current medication.

## 2012-11-07 ENCOUNTER — Ambulatory Visit: Payer: Medicare Other | Admitting: Gastroenterology

## 2013-01-16 DIAGNOSIS — W540XXA Bitten by dog, initial encounter: Secondary | ICD-10-CM | POA: Diagnosis not present

## 2013-01-16 DIAGNOSIS — T148XXA Other injury of unspecified body region, initial encounter: Secondary | ICD-10-CM | POA: Diagnosis not present

## 2013-01-16 DIAGNOSIS — M25519 Pain in unspecified shoulder: Secondary | ICD-10-CM | POA: Diagnosis not present

## 2013-01-16 DIAGNOSIS — Z0000 Encounter for general adult medical examination without abnormal findings: Secondary | ICD-10-CM | POA: Diagnosis not present

## 2013-01-24 ENCOUNTER — Other Ambulatory Visit: Payer: Self-pay

## 2013-01-24 MED ORDER — DICYCLOMINE HCL 10 MG PO CAPS: 10.0000 mg | ORAL_CAPSULE | Freq: Three times a day (TID) | ORAL | Status: DC

## 2013-02-26 ENCOUNTER — Other Ambulatory Visit: Payer: Self-pay | Admitting: *Deleted

## 2013-02-26 MED ORDER — METOPROLOL SUCCINATE ER 50 MG PO TB24: 50.0000 mg | ORAL_TABLET | Freq: Every day | ORAL | Status: DC

## 2013-03-01 DIAGNOSIS — Z23 Encounter for immunization: Secondary | ICD-10-CM | POA: Diagnosis not present

## 2013-04-05 DIAGNOSIS — I10 Essential (primary) hypertension: Secondary | ICD-10-CM | POA: Diagnosis not present

## 2013-04-05 DIAGNOSIS — Z79899 Other long term (current) drug therapy: Secondary | ICD-10-CM | POA: Diagnosis not present

## 2013-05-28 ENCOUNTER — Other Ambulatory Visit: Payer: Self-pay | Admitting: Cardiology

## 2013-06-25 ENCOUNTER — Other Ambulatory Visit: Payer: Self-pay

## 2013-06-25 MED ORDER — PANTOPRAZOLE SODIUM 40 MG PO TBEC: 40.0000 mg | DELAYED_RELEASE_TABLET | Freq: Two times a day (BID) | ORAL | Status: DC

## 2013-08-07 ENCOUNTER — Encounter: Payer: Self-pay | Admitting: Cardiology

## 2013-08-07 ENCOUNTER — Ambulatory Visit (INDEPENDENT_AMBULATORY_CARE_PROVIDER_SITE_OTHER): Payer: Medicare Other | Admitting: Cardiology

## 2013-08-07 VITALS — BP 108/58 | HR 56 | Ht 61.0 in | Wt 206.0 lb

## 2013-08-07 DIAGNOSIS — I2510 Atherosclerotic heart disease of native coronary artery without angina pectoris: Secondary | ICD-10-CM | POA: Diagnosis not present

## 2013-08-07 DIAGNOSIS — E785 Hyperlipidemia, unspecified: Secondary | ICD-10-CM | POA: Diagnosis not present

## 2013-08-07 DIAGNOSIS — I10 Essential (primary) hypertension: Secondary | ICD-10-CM
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
## 2013-08-07 NOTE — Patient Instructions (Signed)
Your physician wants you to follow-up in: 1 year You will receive a reminder letter in the mail two months in advance. If you don't receive a letter, please call our office to schedule the follow-up appointment.    Your physician has recommended you make the following change in your medication:    Reduce your aspirin dose to 81 mg daily    Thank you for choosing [facility] !

## 2013-09-25 ENCOUNTER — Other Ambulatory Visit: Payer: Self-pay | Admitting: Adult Health

## 2013-09-26 ENCOUNTER — Telehealth: Payer: Self-pay | Admitting: *Deleted

## 2013-09-26 MED ORDER — ATORVASTATIN CALCIUM 40 MG PO TABS: 40.0000 mg | ORAL_TABLET | Freq: Every day | ORAL | Status: DC

## 2013-09-26 NOTE — Telephone Encounter (Signed)
Refill request complete 

## 2013-09-26 NOTE — Telephone Encounter (Signed)
RX REFILL [facility]  LIPITOR 40 MG #30

## 2013-10-26 ENCOUNTER — Other Ambulatory Visit: Payer: Self-pay

## 2013-10-26 MED ORDER — PANTOPRAZOLE SODIUM 40 MG PO TBEC: 40.0000 mg | DELAYED_RELEASE_TABLET | Freq: Two times a day (BID) | ORAL | Status: DC

## 2013-10-26 NOTE — Telephone Encounter (Signed)
Overdue OV with RMR. RF done.

## 2013-10-29 ENCOUNTER — Encounter: Payer: Self-pay | Admitting: Internal Medicine

## 2013-10-29 NOTE — Telephone Encounter (Signed)
Mailed letter for patient to call our office to set up OV with RMR

## 2013-10-31 ENCOUNTER — Other Ambulatory Visit: Payer: Self-pay | Admitting: *Deleted

## 2013-10-31 MED ORDER — PANTOPRAZOLE SODIUM 40 MG PO TBEC: 40.0000 mg | DELAYED_RELEASE_TABLET | Freq: Two times a day (BID) | ORAL | Status: DC

## 2014-01-25 ENCOUNTER — Other Ambulatory Visit: Payer: Self-pay | Admitting: Adult Health

## 2014-02-22 DIAGNOSIS — Z23 Encounter for immunization: Secondary | ICD-10-CM | POA: Diagnosis not present

## 2014-02-22 DIAGNOSIS — Z0000 Encounter for general adult medical examination without abnormal findings: Secondary | ICD-10-CM | POA: Diagnosis not present

## 2014-02-26 ENCOUNTER — Other Ambulatory Visit: Payer: Self-pay

## 2014-02-26 MED ORDER — DICYCLOMINE HCL 10 MG PO CAPS: 10.0000 mg | ORAL_CAPSULE | Freq: Three times a day (TID) | ORAL | Status: DC

## 2014-02-27 ENCOUNTER — Encounter: Payer: Self-pay | Admitting: Gastroenterology

## 2014-02-27 ENCOUNTER — Ambulatory Visit: Payer: Medicare Other | Admitting: Gastroenterology

## 2014-04-20 ENCOUNTER — Emergency Department (HOSPITAL_COMMUNITY): Payer: Medicare Other

## 2014-04-20 ENCOUNTER — Emergency Department (HOSPITAL_COMMUNITY)
Admission: EM | Admit: 2014-04-20 | Discharge: 2014-04-20 | Disposition: A | Discharge status: Home / Self Care | Payer: Medicare Other | Attending: Emergency Medicine | Admitting: Emergency Medicine

## 2014-04-20 ENCOUNTER — Encounter (HOSPITAL_COMMUNITY): Payer: Self-pay | Admitting: Emergency Medicine

## 2014-04-20 DIAGNOSIS — K219 Gastro-esophageal reflux disease without esophagitis: Secondary | ICD-10-CM | POA: Insufficient documentation

## 2014-04-20 DIAGNOSIS — E785 Hyperlipidemia, unspecified: Secondary | ICD-10-CM | POA: Insufficient documentation

## 2014-04-20 DIAGNOSIS — F419 Anxiety disorder, unspecified: Secondary | ICD-10-CM | POA: Diagnosis not present

## 2014-04-20 DIAGNOSIS — H540 Blindness, both eyes: Secondary | ICD-10-CM | POA: Insufficient documentation

## 2014-04-20 DIAGNOSIS — Z7982 Long term (current) use of aspirin: Secondary | ICD-10-CM | POA: Diagnosis not present

## 2014-04-20 DIAGNOSIS — R05 Cough: Secondary | ICD-10-CM | POA: Diagnosis not present

## 2014-04-20 DIAGNOSIS — J189 Pneumonia, unspecified organism: Secondary | ICD-10-CM

## 2014-04-20 DIAGNOSIS — J159 Unspecified bacterial pneumonia: Secondary | ICD-10-CM | POA: Diagnosis not present

## 2014-04-20 DIAGNOSIS — I2510 Atherosclerotic heart disease of native coronary artery without angina pectoris: Secondary | ICD-10-CM | POA: Insufficient documentation

## 2014-04-20 DIAGNOSIS — Z87891 Personal history of nicotine dependence: Secondary | ICD-10-CM | POA: Diagnosis not present

## 2014-04-20 DIAGNOSIS — E039 Hypothyroidism, unspecified: Secondary | ICD-10-CM | POA: Diagnosis not present

## 2014-04-20 DIAGNOSIS — R0789 Other chest pain: Secondary | ICD-10-CM | POA: Insufficient documentation

## 2014-04-20 DIAGNOSIS — Z79899 Other long term (current) drug therapy: Secondary | ICD-10-CM | POA: Diagnosis not present

## 2014-04-20 DIAGNOSIS — E669 Obesity, unspecified: Secondary | ICD-10-CM | POA: Insufficient documentation

## 2014-04-20 DIAGNOSIS — I10 Essential (primary) hypertension: Secondary | ICD-10-CM | POA: Diagnosis not present

## 2014-04-20 MED ORDER — HYDROCOD POLST-CHLORPHEN POLST 10-8 MG/5ML PO LQCR: 5.0000 mL | Freq: Two times a day (BID) | ORAL | Status: DC | PRN

## 2014-04-20 MED ORDER — AZITHROMYCIN 250 MG PO TABS: ORAL_TABLET | ORAL | Status: DC

## 2014-04-20 NOTE — Discharge Instructions (Signed)
Zithromax as prescribed.  Tussionex as prescribed as needed for cough.  Return to the emergency department if you develop difficulty breathing, severe chest pain, or other new and concerning symptoms.   Pneumonia Pneumonia is an infection of the lungs.  CAUSES Pneumonia may be caused by bacteria or a virus. Usually, these infections are caused by breathing infectious particles into the lungs (respiratory tract). SIGNS AND SYMPTOMS   Cough.  Fever.  Chest pain.  Increased rate of breathing.  Wheezing.  Mucus production. DIAGNOSIS  If you have the common symptoms of pneumonia, your health care provider will typically confirm the diagnosis with a chest X-ray. The X-ray will show an abnormality in the lung (pulmonary infiltrate) if you have pneumonia. Other tests of your blood, urine, or sputum may be done to find the specific cause of your pneumonia. Your health care provider may also do tests (blood gases or pulse oximetry) to see how well your lungs are working. TREATMENT  Some forms of pneumonia may be spread to other people when you cough or sneeze. You may be asked to wear a mask before and during your exam. Pneumonia that is caused by bacteria is treated with antibiotic medicine. Pneumonia that is caused by the influenza virus may be treated with an antiviral medicine. Most other viral infections must run their course. These infections will not respond to antibiotics.  HOME CARE INSTRUCTIONS   Cough suppressants may be used if you are losing too much rest. However, coughing protects you by clearing your lungs. You should avoid using cough suppressants if you can.  Your health care provider may have prescribed medicine if he or she thinks your pneumonia is caused by bacteria or influenza. Finish your medicine even if you start to feel better.  Your health care provider may also prescribe an expectorant. This loosens the mucus to be coughed up.  Take medicines only as directed by  your health care provider.  Do not smoke. Smoking is a common cause of bronchitis and can contribute to pneumonia. If you are a smoker and continue to smoke, your cough may last several weeks after your pneumonia has cleared.  A cold steam vaporizer or humidifier in your room or home may help loosen mucus.  Coughing is often worse at night. Sleeping in a semi-upright position in a recliner or using a couple pillows under your head will help with this.  Get rest as you feel it is needed. Your body will usually let you know when you need to rest. PREVENTION A pneumococcal shot (vaccine) is available to prevent a common bacterial cause of pneumonia. This is usually suggested for:  People over 76 years old.  Patients on chemotherapy.  People with chronic lung problems, such as bronchitis or emphysema.  People with immune system problems. If you are over 65 or have a high risk condition, you may receive the pneumococcal vaccine if you have not received it before. In some countries, a routine influenza vaccine is also recommended. This vaccine can help prevent some cases of pneumonia.You may be offered the influenza vaccine as part of your care. If you smoke, it is time to quit. You may receive instructions on how to stop smoking. Your health care provider can provide medicines and counseling to help you quit. SEEK MEDICAL CARE IF: You have a fever. SEEK IMMEDIATE MEDICAL CARE IF:   Your illness becomes worse. This is especially true if you are elderly or weakened from any other disease.  You cannot  control your cough with suppressants and are losing sleep.  You begin coughing up blood.  You develop pain which is getting worse or is uncontrolled with medicines.  Any of the symptoms which initially brought you in for treatment are getting worse rather than better.  You develop shortness of breath or chest pain. MAKE SURE YOU:   Understand these instructions.  Will watch your  condition.  Will get help right away if you are not doing well or get worse. Document Released: 05/17/2005 Document Revised: 10/01/2013 Document Reviewed: 08/06/2010 [facility] Patient Information 2015 [place], [place]. This information is not intended to replace advice given to you by your health care provider. Make sure you discuss any questions you have with your health care provider.

## 2014-04-20 NOTE — ED Notes (Signed)
Pt reports productive cough x 1 week with SHOB.
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
## 2014-05-02 ENCOUNTER — Other Ambulatory Visit: Payer: Self-pay | Admitting: Cardiology

## 2014-05-02 MED ORDER — ATORVASTATIN CALCIUM 40 MG PO TABS: 40.0000 mg | ORAL_TABLET | Freq: Every day | ORAL | Status: DC

## 2014-05-02 NOTE — Telephone Encounter (Signed)
Refill complete 

## 2014-05-02 NOTE — Telephone Encounter (Signed)
Received fax refill request  Rx # [id number] Medication:  Lipitor 40 mg tablet Qty 30 Sig:  Take one tablet by mouth once daily at 6 pm Physician:  Harl Bowie

## 2014-06-14 ENCOUNTER — Emergency Department (HOSPITAL_COMMUNITY)
Admission: EM | Admit: 2014-06-14 | Discharge: 2014-06-14 | Disposition: A | Discharge status: Home / Self Care | Payer: Medicare Other | Attending: Emergency Medicine | Admitting: Emergency Medicine

## 2014-06-14 ENCOUNTER — Emergency Department (HOSPITAL_COMMUNITY): Payer: Medicare Other

## 2014-06-14 ENCOUNTER — Encounter (HOSPITAL_COMMUNITY): Payer: Self-pay | Admitting: *Deleted

## 2014-06-14 DIAGNOSIS — R0602 Shortness of breath: Secondary | ICD-10-CM | POA: Diagnosis not present

## 2014-06-14 DIAGNOSIS — J209 Acute bronchitis, unspecified: Secondary | ICD-10-CM | POA: Insufficient documentation

## 2014-06-14 DIAGNOSIS — F419 Anxiety disorder, unspecified: Secondary | ICD-10-CM | POA: Insufficient documentation

## 2014-06-14 DIAGNOSIS — I10 Essential (primary) hypertension: Secondary | ICD-10-CM | POA: Diagnosis not present

## 2014-06-14 DIAGNOSIS — Z792 Long term (current) use of antibiotics: Secondary | ICD-10-CM | POA: Insufficient documentation

## 2014-06-14 DIAGNOSIS — Z87828 Personal history of other (healed) physical injury and trauma: Secondary | ICD-10-CM | POA: Insufficient documentation

## 2014-06-14 DIAGNOSIS — K219 Gastro-esophageal reflux disease without esophagitis: Secondary | ICD-10-CM | POA: Diagnosis not present

## 2014-06-14 DIAGNOSIS — R05 Cough: Secondary | ICD-10-CM

## 2014-06-14 DIAGNOSIS — E039 Hypothyroidism, unspecified: Secondary | ICD-10-CM | POA: Insufficient documentation

## 2014-06-14 DIAGNOSIS — J40 Bronchitis, not specified as acute or chronic: Secondary | ICD-10-CM

## 2014-06-14 DIAGNOSIS — H540 Blindness, both eyes: Secondary | ICD-10-CM | POA: Diagnosis not present

## 2014-06-14 DIAGNOSIS — E669 Obesity, unspecified: Secondary | ICD-10-CM | POA: Diagnosis not present

## 2014-06-14 DIAGNOSIS — Z87891 Personal history of nicotine dependence: Secondary | ICD-10-CM | POA: Insufficient documentation

## 2014-06-14 DIAGNOSIS — E785 Hyperlipidemia, unspecified: Secondary | ICD-10-CM | POA: Insufficient documentation

## 2014-06-14 DIAGNOSIS — Z79899 Other long term (current) drug therapy: Secondary | ICD-10-CM | POA: Insufficient documentation

## 2014-06-14 DIAGNOSIS — R059 Cough, unspecified: Secondary | ICD-10-CM

## 2014-06-14 DIAGNOSIS — Z7982 Long term (current) use of aspirin: Secondary | ICD-10-CM | POA: Insufficient documentation

## 2014-06-14 LAB — CBC WITH DIFFERENTIAL/PLATELET
BASOS ABS: 0 10*3/uL (ref 0.0–0.1)
Basophils Relative: 1 % (ref 0–1)
Eosinophils Absolute: 0.2 10*3/uL (ref 0.0–0.7)
Eosinophils Relative: 4 % (ref 0–5)
HCT: 36.6 % (ref 36.0–46.0)
Hemoglobin: 11.4 g/dL — ABNORMAL LOW (ref 12.0–15.0)
Lymphocytes Relative: 21 % (ref 12–46)
Lymphs Abs: 0.9 10*3/uL (ref 0.7–4.0)
MCH: 29.1 pg (ref 26.0–34.0)
MCHC: 31.1 g/dL (ref 30.0–36.0)
MCV: 93.4 fL (ref 78.0–100.0)
Monocytes Absolute: 0.2 10*3/uL (ref 0.1–1.0)
Monocytes Relative: 5 % (ref 3–12)
NEUTROS ABS: 2.8 10*3/uL (ref 1.7–7.7)
NEUTROS PCT: 69 % (ref 43–77)
PLATELETS: 202 10*3/uL (ref 150–400)
RBC: 3.92 MIL/uL (ref 3.87–5.11)
RDW: 16.7 % — ABNORMAL HIGH (ref 11.5–15.5)
WBC: 4.1 10*3/uL (ref 4.0–10.5)

## 2014-06-14 LAB — COMPREHENSIVE METABOLIC PANEL
ALT: 44 U/L — ABNORMAL HIGH (ref 0–35)
AST: 88 U/L — AB (ref 0–37)
Albumin: 3.5 g/dL (ref 3.5–5.2)
Alkaline Phosphatase: 77 U/L (ref 39–117)
Anion gap: 7 (ref 5–15)
BUN: 14 mg/dL (ref 6–23)
CO2: 25 mmol/L (ref 19–32)
CREATININE: 1.14 mg/dL — AB (ref 0.50–1.10)
Calcium: 8.6 mg/dL (ref 8.4–10.5)
Chloride: 102 mEq/L (ref 96–112)
GFR calc Af Amer: 57 mL/min — ABNORMAL LOW (ref >90)
GFR, EST NON AFRICAN AMERICAN: 49 mL/min — AB (ref >90)
Glucose, Bld: 169 mg/dL — ABNORMAL HIGH (ref 70–99)
Potassium: 3.4 mmol/L — ABNORMAL LOW (ref 3.5–5.1)
Sodium: 134 mmol/L — ABNORMAL LOW (ref 135–145)
Total Bilirubin: 0.6 mg/dL (ref 0.3–1.2)
Total Protein: 9.6 g/dL — ABNORMAL HIGH (ref 6.0–8.3)

## 2014-06-14 MED ORDER — LEVOFLOXACIN 500 MG PO TABS: 500.0000 mg | ORAL_TABLET | Freq: Every day | ORAL | Status: DC

## 2014-06-14 MED ORDER — IPRATROPIUM-ALBUTEROL 0.5-2.5 (3) MG/3ML IN SOLN
3.0000 mL | Freq: Once | RESPIRATORY_TRACT | Status: AC
  Administered 2014-06-14: 3 mL via RESPIRATORY_TRACT
  Filled 2014-06-14: qty 3

## 2014-06-14 MED ORDER — ALBUTEROL SULFATE (2.5 MG/3ML) 0.083% IN NEBU: 5.0000 mg | INHALATION_SOLUTION | Freq: Once | RESPIRATORY_TRACT | Status: DC

## 2014-06-14 MED ORDER — IPRATROPIUM BROMIDE 0.02 % IN SOLN: 0.5000 mg | Freq: Once | RESPIRATORY_TRACT | Status: DC

## 2014-06-14 MED ORDER — ALBUTEROL SULFATE HFA 108 (90 BASE) MCG/ACT IN AERS
2.0000 | INHALATION_SPRAY | RESPIRATORY_TRACT | Status: DC | PRN
  Administered 2014-06-14: 2 via RESPIRATORY_TRACT
  Filled 2014-06-14: qty 6.7

## 2014-06-14 MED ORDER — IPRATROPIUM-ALBUTEROL 0.5-2.5 (3) MG/3ML IN SOLN: 3.0000 mL | RESPIRATORY_TRACT | Status: DC

## 2014-06-14 MED ORDER — ALBUTEROL SULFATE (2.5 MG/3ML) 0.083% IN NEBU
2.5000 mg | INHALATION_SOLUTION | Freq: Once | RESPIRATORY_TRACT | Status: AC
  Administered 2014-06-14: 2.5 mg via RESPIRATORY_TRACT
  Filled 2014-06-14: qty 3

## 2014-06-14 MED ORDER — ALBUTEROL SULFATE HFA 108 (90 BASE) MCG/ACT IN AERS: 1.0000 | INHALATION_SPRAY | Freq: Four times a day (QID) | RESPIRATORY_TRACT | Status: DC | PRN

## 2014-06-14 MED ORDER — PREDNISONE 10 MG PO TABS: 20.0000 mg | ORAL_TABLET | Freq: Every day | ORAL | Status: DC

## 2014-06-14 MED ORDER — PREDNISONE 50 MG PO TABS
60.0000 mg | ORAL_TABLET | Freq: Once | ORAL | Status: AC
  Administered 2014-06-14: 60 mg via ORAL
  Filled 2014-06-14 (×2): qty 1

## 2014-06-14 NOTE — Discharge Instructions (Signed)
Follow up with your md next week for recheck °

## 2014-06-14 NOTE — ED Notes (Addendum)
Pt co rib pain from cough that has lasted for 2 weeks. Pt has audible wheezing at triage, felt like she was going to pass out briefly. Pt co N/V, had a bad headache when she woke up, denies headache currently.
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
## 2014-06-14 NOTE — ED Notes (Signed)
Patient ambulated to bathroom with one assist. Patient is legally blind and needs guidance during ambulation.

## 2014-06-26 DIAGNOSIS — R05 Cough: Secondary | ICD-10-CM | POA: Diagnosis not present

## 2014-06-26 DIAGNOSIS — J309 Allergic rhinitis, unspecified: Secondary | ICD-10-CM | POA: Diagnosis not present

## 2014-07-03 ENCOUNTER — Other Ambulatory Visit: Payer: Self-pay

## 2014-07-03 ENCOUNTER — Encounter: Payer: Self-pay | Admitting: Internal Medicine

## 2014-07-03 MED ORDER — PANTOPRAZOLE SODIUM 40 MG PO TBEC: 40.0000 mg | DELAYED_RELEASE_TABLET | Freq: Two times a day (BID) | ORAL | Status: DC

## 2014-07-03 NOTE — Telephone Encounter (Signed)
APPOINTMENT MADE AND LETTER SENT °

## 2014-07-03 NOTE — Telephone Encounter (Signed)
Pt said she is on pantoprazole bid and it is working well for her. She is also aware that she needs an ov. stacey please schedule.

## 2014-07-03 NOTE — Telephone Encounter (Signed)
Lets touch base with patient. I'm not sure what she is taking. Had been on protonix at last OV. Given trial of dexilant and too strong. The last thing we gave her was zegerid.   Let's make sure she want protonix and once daily or twice daily.   She is overdue for OV as well.

## 2014-07-03 NOTE — Telephone Encounter (Signed)
RX done. 

## 2014-07-12 DIAGNOSIS — E782 Mixed hyperlipidemia: Secondary | ICD-10-CM | POA: Diagnosis not present

## 2014-07-12 DIAGNOSIS — E039 Hypothyroidism, unspecified: Secondary | ICD-10-CM | POA: Diagnosis not present

## 2014-07-16 DIAGNOSIS — R7301 Impaired fasting glucose: Secondary | ICD-10-CM | POA: Diagnosis not present

## 2014-07-16 DIAGNOSIS — I10 Essential (primary) hypertension: Secondary | ICD-10-CM | POA: Diagnosis not present

## 2014-07-16 DIAGNOSIS — E785 Hyperlipidemia, unspecified: Secondary | ICD-10-CM | POA: Diagnosis not present

## 2014-07-16 DIAGNOSIS — E039 Hypothyroidism, unspecified: Secondary | ICD-10-CM | POA: Diagnosis not present

## 2014-07-19 ENCOUNTER — Other Ambulatory Visit (HOSPITAL_COMMUNITY): Payer: Self-pay | Admitting: Internal Medicine

## 2014-07-19 DIAGNOSIS — R945 Abnormal results of liver function studies: Secondary | ICD-10-CM

## 2014-07-25 ENCOUNTER — Ambulatory Visit (HOSPITAL_COMMUNITY)
Admission: RE | Admit: 2014-07-25 | Discharge: 2014-07-25 | Disposition: A | Discharge status: Home / Self Care | Payer: Medicare Other | Source: Ambulatory Visit | Attending: Internal Medicine | Admitting: Internal Medicine

## 2014-07-25 DIAGNOSIS — R932 Abnormal findings on diagnostic imaging of liver and biliary tract: Secondary | ICD-10-CM | POA: Diagnosis not present

## 2014-07-25 DIAGNOSIS — R748 Abnormal levels of other serum enzymes: Secondary | ICD-10-CM | POA: Diagnosis not present

## 2014-07-25 DIAGNOSIS — K7689 Other specified diseases of liver: Secondary | ICD-10-CM | POA: Diagnosis not present

## 2014-07-25 DIAGNOSIS — R945 Abnormal results of liver function studies: Secondary | ICD-10-CM

## 2014-07-30 ENCOUNTER — Ambulatory Visit: Payer: Medicare Other | Admitting: Gastroenterology

## 2014-08-07 ENCOUNTER — Encounter: Payer: Self-pay | Admitting: Cardiology

## 2014-08-07 ENCOUNTER — Ambulatory Visit (INDEPENDENT_AMBULATORY_CARE_PROVIDER_SITE_OTHER): Payer: Medicare Other | Admitting: Cardiology

## 2014-08-07 VITALS — BP 118/72 | HR 59 | Ht 61.0 in | Wt 210.4 lb

## 2014-08-07 DIAGNOSIS — I10 Essential (primary) hypertension: Secondary | ICD-10-CM

## 2014-08-07 DIAGNOSIS — I2510 Atherosclerotic heart disease of native coronary artery without angina pectoris: Secondary | ICD-10-CM | POA: Diagnosis not present

## 2014-08-07 DIAGNOSIS — E119 Type 2 diabetes mellitus without complications: Secondary | ICD-10-CM

## 2014-08-07 DIAGNOSIS — E785 Hyperlipidemia, unspecified: Secondary | ICD-10-CM | POA: Diagnosis not present

## 2014-08-07 NOTE — Patient Instructions (Signed)
Your physician wants you to follow-up in: 1 year with DrBranch You will receive a reminder letter in the mail two months in advance. If you don't receive a letter, please call our office to schedule the follow-up appointment.     Your physician recommends that you continue on your current medications as directed. Please refer to the Current Medication list given to you today.      Thank you for choosing [facility] !        
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
## 2014-08-19 ENCOUNTER — Emergency Department (HOSPITAL_COMMUNITY)
Admission: EM | Admit: 2014-08-19 | Discharge: 2014-08-19 | Disposition: A | Discharge status: Home / Self Care | Payer: Medicare Other | Attending: Emergency Medicine | Admitting: Emergency Medicine

## 2014-08-19 ENCOUNTER — Encounter (HOSPITAL_COMMUNITY): Payer: Self-pay | Admitting: Emergency Medicine

## 2014-08-19 ENCOUNTER — Emergency Department (HOSPITAL_COMMUNITY): Payer: Medicare Other

## 2014-08-19 DIAGNOSIS — Z7982 Long term (current) use of aspirin: Secondary | ICD-10-CM | POA: Insufficient documentation

## 2014-08-19 DIAGNOSIS — I10 Essential (primary) hypertension: Secondary | ICD-10-CM | POA: Insufficient documentation

## 2014-08-19 DIAGNOSIS — Z87891 Personal history of nicotine dependence: Secondary | ICD-10-CM | POA: Insufficient documentation

## 2014-08-19 DIAGNOSIS — R079 Chest pain, unspecified: Secondary | ICD-10-CM | POA: Diagnosis not present

## 2014-08-19 DIAGNOSIS — R072 Precordial pain: Secondary | ICD-10-CM | POA: Diagnosis not present

## 2014-08-19 DIAGNOSIS — F419 Anxiety disorder, unspecified: Secondary | ICD-10-CM | POA: Insufficient documentation

## 2014-08-19 DIAGNOSIS — R1110 Vomiting, unspecified: Secondary | ICD-10-CM | POA: Insufficient documentation

## 2014-08-19 DIAGNOSIS — Z79899 Other long term (current) drug therapy: Secondary | ICD-10-CM | POA: Diagnosis not present

## 2014-08-19 DIAGNOSIS — K219 Gastro-esophageal reflux disease without esophagitis: Secondary | ICD-10-CM | POA: Diagnosis not present

## 2014-08-19 DIAGNOSIS — E669 Obesity, unspecified: Secondary | ICD-10-CM | POA: Diagnosis not present

## 2014-08-19 DIAGNOSIS — R05 Cough: Secondary | ICD-10-CM | POA: Diagnosis not present

## 2014-08-19 LAB — BASIC METABOLIC PANEL
Anion gap: 11 (ref 5–15)
BUN: 29 mg/dL — ABNORMAL HIGH (ref 6–23)
CO2: 25 mmol/L (ref 19–32)
Calcium: 8.9 mg/dL (ref 8.4–10.5)
Chloride: 100 mmol/L (ref 96–112)
Creatinine, Ser: 1.48 mg/dL — ABNORMAL HIGH (ref 0.50–1.10)
GFR calc Af Amer: 41 mL/min — ABNORMAL LOW (ref >90)
GFR calc non Af Amer: 36 mL/min — ABNORMAL LOW (ref >90)
Glucose, Bld: 127 mg/dL — ABNORMAL HIGH (ref 70–99)
Potassium: 3.6 mmol/L (ref 3.5–5.1)
Sodium: 136 mmol/L (ref 135–145)

## 2014-08-19 LAB — CBC
HCT: 35.4 % — ABNORMAL LOW (ref 36.0–46.0)
Hemoglobin: 11.6 g/dL — ABNORMAL LOW (ref 12.0–15.0)
MCH: 30.3 pg (ref 26.0–34.0)
MCHC: 32.8 g/dL (ref 30.0–36.0)
MCV: 92.4 fL (ref 78.0–100.0)
Platelets: 193 10*3/uL (ref 150–400)
RBC: 3.83 MIL/uL — ABNORMAL LOW (ref 3.87–5.11)
RDW: 15 % (ref 11.5–15.5)
WBC: 4.8 10*3/uL (ref 4.0–10.5)

## 2014-08-19 LAB — TROPONIN I: Troponin I: <0.03 ng/mL (ref <0.031)

## 2014-08-19 MED ORDER — OXYCODONE-ACETAMINOPHEN 5-325 MG PO TABS
2.0000 | ORAL_TABLET | Freq: Once | ORAL | Status: AC
  Administered 2014-08-19: 2 via ORAL
  Filled 2014-08-19: qty 2

## 2014-08-19 NOTE — ED Notes (Signed)
Patient alert and oriented at this time. Laughing and talking during triage. Patient states "It stopped hurting till they got to moving me in the ambulance."

## 2014-08-19 NOTE — ED Notes (Signed)
Patient brought in by EMS with complaint of chest pain since yesterday. Patient states pain is worse with movement. Per EMS, patient took 3 nitro and 324 mg aspirin prior to EMS arrival. Patient pain free en route to ED.

## 2014-08-19 NOTE — Discharge Instructions (Signed)
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
## 2014-08-23 ENCOUNTER — Other Ambulatory Visit: Payer: Self-pay | Admitting: Cardiology

## 2014-08-23 MED ORDER — METOPROLOL SUCCINATE ER 50 MG PO TB24: ORAL_TABLET | ORAL | Status: DC

## 2014-08-23 NOTE — Telephone Encounter (Signed)
escribed refill 

## 2014-08-23 NOTE — Telephone Encounter (Signed)
Received fax refill request  Rx # [id number] Medication:  Metoprolol ER 50 mg tab Qty 30 Sig:  Take one tablet by mouth once daily with or immediately followin a meal Physician:  Harl Bowie

## 2014-10-15 DIAGNOSIS — N390 Urinary tract infection, site not specified: Secondary | ICD-10-CM | POA: Diagnosis not present

## 2014-10-17 ENCOUNTER — Emergency Department (HOSPITAL_COMMUNITY): Payer: Medicare Other

## 2014-10-17 ENCOUNTER — Inpatient Hospital Stay (HOSPITAL_COMMUNITY)
Admission: EM | Admit: 2014-10-17 | Discharge: 2014-10-21 | DRG: 917 | Disposition: A | Discharge status: Home Health | Payer: Medicare Other | Attending: Internal Medicine | Admitting: Internal Medicine

## 2014-10-17 DIAGNOSIS — Z79899 Other long term (current) drug therapy: Secondary | ICD-10-CM

## 2014-10-17 DIAGNOSIS — Z79891 Long term (current) use of opiate analgesic: Secondary | ICD-10-CM

## 2014-10-17 DIAGNOSIS — R000 Tachycardia, unspecified: Secondary | ICD-10-CM | POA: Diagnosis not present

## 2014-10-17 DIAGNOSIS — R0902 Hypoxemia: Secondary | ICD-10-CM | POA: Diagnosis not present

## 2014-10-17 DIAGNOSIS — Z7952 Long term (current) use of systemic steroids: Secondary | ICD-10-CM | POA: Diagnosis not present

## 2014-10-17 DIAGNOSIS — K219 Gastro-esophageal reflux disease without esophagitis: Secondary | ICD-10-CM | POA: Diagnosis present

## 2014-10-17 DIAGNOSIS — N179 Acute kidney failure, unspecified: Secondary | ICD-10-CM | POA: Diagnosis not present

## 2014-10-17 DIAGNOSIS — J209 Acute bronchitis, unspecified: Secondary | ICD-10-CM | POA: Diagnosis present

## 2014-10-17 DIAGNOSIS — T402X1A Poisoning by other opioids, accidental (unintentional), initial encounter: Principal | ICD-10-CM | POA: Diagnosis present

## 2014-10-17 DIAGNOSIS — E785 Hyperlipidemia, unspecified: Secondary | ICD-10-CM | POA: Diagnosis present

## 2014-10-17 DIAGNOSIS — H540 Blindness, both eyes: Secondary | ICD-10-CM | POA: Diagnosis present

## 2014-10-17 DIAGNOSIS — J219 Acute bronchiolitis, unspecified: Secondary | ICD-10-CM | POA: Diagnosis not present

## 2014-10-17 DIAGNOSIS — T50901S Poisoning by unspecified drugs, medicaments and biological substances, accidental (unintentional), sequela: Secondary | ICD-10-CM | POA: Diagnosis not present

## 2014-10-17 DIAGNOSIS — I10 Essential (primary) hypertension: Secondary | ICD-10-CM | POA: Diagnosis present

## 2014-10-17 DIAGNOSIS — T50904A Poisoning by unspecified drugs, medicaments and biological substances, undetermined, initial encounter: Secondary | ICD-10-CM | POA: Diagnosis not present

## 2014-10-17 DIAGNOSIS — H548 Legal blindness, as defined in USA: Secondary | ICD-10-CM | POA: Diagnosis present

## 2014-10-17 DIAGNOSIS — J9602 Acute respiratory failure with hypercapnia: Secondary | ICD-10-CM | POA: Diagnosis present

## 2014-10-17 DIAGNOSIS — R7989 Other specified abnormal findings of blood chemistry: Secondary | ICD-10-CM | POA: Diagnosis not present

## 2014-10-17 DIAGNOSIS — I2510 Atherosclerotic heart disease of native coronary artery without angina pectoris: Secondary | ICD-10-CM | POA: Diagnosis present

## 2014-10-17 DIAGNOSIS — R7401 Elevation of levels of liver transaminase levels: Secondary | ICD-10-CM | POA: Diagnosis present

## 2014-10-17 DIAGNOSIS — D649 Anemia, unspecified: Secondary | ICD-10-CM | POA: Diagnosis not present

## 2014-10-17 DIAGNOSIS — T40602A Poisoning by unspecified narcotics, intentional self-harm, initial encounter: Secondary | ICD-10-CM

## 2014-10-17 DIAGNOSIS — R001 Bradycardia, unspecified: Secondary | ICD-10-CM | POA: Diagnosis present

## 2014-10-17 DIAGNOSIS — Z87891 Personal history of nicotine dependence: Secondary | ICD-10-CM | POA: Diagnosis not present

## 2014-10-17 DIAGNOSIS — F419 Anxiety disorder, unspecified: Secondary | ICD-10-CM | POA: Diagnosis present

## 2014-10-17 DIAGNOSIS — J9600 Acute respiratory failure, unspecified whether with hypoxia or hypercapnia: Secondary | ICD-10-CM | POA: Diagnosis not present

## 2014-10-17 DIAGNOSIS — J9601 Acute respiratory failure with hypoxia: Secondary | ICD-10-CM | POA: Diagnosis not present

## 2014-10-17 DIAGNOSIS — E872 Acidosis: Secondary | ICD-10-CM | POA: Diagnosis present

## 2014-10-17 DIAGNOSIS — E662 Morbid (severe) obesity with alveolar hypoventilation: Secondary | ICD-10-CM | POA: Diagnosis present

## 2014-10-17 DIAGNOSIS — E039 Hypothyroidism, unspecified: Secondary | ICD-10-CM | POA: Diagnosis present

## 2014-10-17 DIAGNOSIS — E875 Hyperkalemia: Secondary | ICD-10-CM | POA: Diagnosis present

## 2014-10-17 DIAGNOSIS — R740 Nonspecific elevation of levels of transaminase and lactic acid dehydrogenase [LDH]: Secondary | ICD-10-CM | POA: Diagnosis present

## 2014-10-17 DIAGNOSIS — R509 Fever, unspecified: Secondary | ICD-10-CM

## 2014-10-17 DIAGNOSIS — E871 Hypo-osmolality and hyponatremia: Secondary | ICD-10-CM | POA: Diagnosis present

## 2014-10-17 DIAGNOSIS — Z6839 Body mass index (BMI) 39.0-39.9, adult: Secondary | ICD-10-CM

## 2014-10-17 DIAGNOSIS — Z452 Encounter for adjustment and management of vascular access device: Secondary | ICD-10-CM | POA: Diagnosis not present

## 2014-10-17 DIAGNOSIS — G9340 Encephalopathy, unspecified: Secondary | ICD-10-CM | POA: Diagnosis not present

## 2014-10-17 DIAGNOSIS — R739 Hyperglycemia, unspecified: Secondary | ICD-10-CM | POA: Diagnosis present

## 2014-10-17 DIAGNOSIS — Z4682 Encounter for fitting and adjustment of non-vascular catheter: Secondary | ICD-10-CM | POA: Diagnosis not present

## 2014-10-17 DIAGNOSIS — G92 Toxic encephalopathy: Secondary | ICD-10-CM | POA: Diagnosis present

## 2014-10-17 DIAGNOSIS — R05 Cough: Secondary | ICD-10-CM | POA: Diagnosis not present

## 2014-10-17 DIAGNOSIS — J9811 Atelectasis: Secondary | ICD-10-CM | POA: Diagnosis not present

## 2014-10-17 DIAGNOSIS — T50901A Poisoning by unspecified drugs, medicaments and biological substances, accidental (unintentional), initial encounter: Secondary | ICD-10-CM

## 2014-10-17 DIAGNOSIS — J9690 Respiratory failure, unspecified, unspecified whether with hypoxia or hypercapnia: Secondary | ICD-10-CM | POA: Diagnosis not present

## 2014-10-17 LAB — URINALYSIS, ROUTINE W REFLEX MICROSCOPIC
Bilirubin Urine: NEGATIVE
Bilirubin Urine: NEGATIVE
Glucose, UA: NEGATIVE mg/dL
Glucose, UA: NEGATIVE mg/dL
Ketones, ur: NEGATIVE mg/dL
Leukocytes, UA: NEGATIVE
Leukocytes, UA: NEGATIVE
NITRITE: NEGATIVE
Nitrite: NEGATIVE
PH: 5 (ref 5.0–8.0)
PROTEIN: 30 mg/dL — AB
Protein, ur: NEGATIVE mg/dL
SPECIFIC GRAVITY, URINE: 1.017 (ref 1.005–1.030)
UROBILINOGEN UA: 0.2 mg/dL (ref 0.0–1.0)
UROBILINOGEN UA: 0.2 mg/dL (ref 0.0–1.0)
pH: 5.5 (ref 5.0–8.0)

## 2014-10-17 LAB — COMPREHENSIVE METABOLIC PANEL
ALT: 100 U/L — ABNORMAL HIGH (ref 14–54)
AST: 244 U/L — AB (ref 15–41)
Albumin: 3.3 g/dL — ABNORMAL LOW (ref 3.5–5.0)
Alkaline Phosphatase: 75 U/L (ref 38–126)
Anion gap: 12 (ref 5–15)
BUN: 47 mg/dL — ABNORMAL HIGH (ref 6–20)
CALCIUM: 8.2 mg/dL — AB (ref 8.9–10.3)
CO2: 22 mmol/L (ref 22–32)
CREATININE: 5.4 mg/dL — AB (ref 0.44–1.00)
Chloride: 98 mmol/L — ABNORMAL LOW (ref 101–111)
GFR calc Af Amer: 9 mL/min — ABNORMAL LOW (ref >60)
GFR calc non Af Amer: 7 mL/min — ABNORMAL LOW (ref >60)
Glucose, Bld: 186 mg/dL — ABNORMAL HIGH (ref 65–99)
POTASSIUM: 6.3 mmol/L — AB (ref 3.5–5.1)
Sodium: 132 mmol/L — ABNORMAL LOW (ref 135–145)
Total Bilirubin: 0.7 mg/dL (ref 0.3–1.2)
Total Protein: 9.8 g/dL — ABNORMAL HIGH (ref 6.5–8.1)

## 2014-10-17 LAB — CBC WITH DIFFERENTIAL/PLATELET
Basophils Absolute: 0 10*3/uL (ref 0.0–0.1)
Basophils Relative: 0 % (ref 0–1)
EOS PCT: 0 % (ref 0–5)
Eosinophils Absolute: 0 10*3/uL (ref 0.0–0.7)
HEMATOCRIT: 33 % — AB (ref 36.0–46.0)
Hemoglobin: 10.1 g/dL — ABNORMAL LOW (ref 12.0–15.0)
LYMPHS ABS: 1.6 10*3/uL (ref 0.7–4.0)
LYMPHS PCT: 17 % (ref 12–46)
MCH: 30.3 pg (ref 26.0–34.0)
MCHC: 30.6 g/dL (ref 30.0–36.0)
MCV: 99.1 fL (ref 78.0–100.0)
MONO ABS: 1.2 10*3/uL — AB (ref 0.1–1.0)
Monocytes Relative: 13 % — ABNORMAL HIGH (ref 3–12)
Neutro Abs: 6.9 10*3/uL (ref 1.7–7.7)
Neutrophils Relative %: 70 % (ref 43–77)
PLATELETS: 323 10*3/uL (ref 150–400)
RBC: 3.33 MIL/uL — AB (ref 3.87–5.11)
RDW: 17.4 % — ABNORMAL HIGH (ref 11.5–15.5)
WBC: 9.8 10*3/uL (ref 4.0–10.5)

## 2014-10-17 LAB — URINE MICROSCOPIC-ADD ON

## 2014-10-17 LAB — CBC
HCT: 28.9 % — ABNORMAL LOW (ref 36.0–46.0)
Hemoglobin: 9 g/dL — ABNORMAL LOW (ref 12.0–15.0)
MCH: 29.7 pg (ref 26.0–34.0)
MCHC: 31.1 g/dL (ref 30.0–36.0)
MCV: 95.4 fL (ref 78.0–100.0)
Platelets: 210 10*3/uL (ref 150–400)
RBC: 3.03 MIL/uL — AB (ref 3.87–5.11)
RDW: 17.2 % — AB (ref 11.5–15.5)
WBC: 7.4 10*3/uL (ref 4.0–10.5)

## 2014-10-17 LAB — BLOOD GAS, ARTERIAL
ACID-BASE DEFICIT: 5 mmol/L — AB (ref 0.0–2.0)
Bicarbonate: 21.8 mEq/L (ref 20.0–24.0)
DRAWN BY: 23534
FIO2: 100 %
MECHVT: 380 mL
O2 CONTENT: 100 L/min
O2 Saturation: 97.9 %
PCO2 ART: 57.4 mmHg — AB (ref 35.0–45.0)
PEEP: 5 cmH2O
PO2 ART: 171 mmHg — AB (ref 80.0–100.0)
Patient temperature: 37
RATE: 14 resp/min
TCO2: 20.2 mmol/L (ref 0–100)
pH, Arterial: 7.205 — ABNORMAL LOW (ref 7.350–7.450)

## 2014-10-17 LAB — SALICYLATE LEVEL: Salicylate Lvl: <4 mg/dL (ref 2.8–30.0)

## 2014-10-17 LAB — HEPATIC FUNCTION PANEL
ALK PHOS: 60 U/L (ref 38–126)
ALT: 196 U/L — ABNORMAL HIGH (ref 14–54)
AST: 481 U/L — ABNORMAL HIGH (ref 15–41)
Albumin: 2.5 g/dL — ABNORMAL LOW (ref 3.5–5.0)
BILIRUBIN DIRECT: 0.2 mg/dL (ref 0.1–0.5)
BILIRUBIN INDIRECT: 0.3 mg/dL (ref 0.3–0.9)
TOTAL PROTEIN: 7.9 g/dL (ref 6.5–8.1)
Total Bilirubin: 0.5 mg/dL (ref 0.3–1.2)

## 2014-10-17 LAB — LACTIC ACID, PLASMA
LACTIC ACID, VENOUS: 1.3 mmol/L (ref 0.5–2.0)
Lactic Acid, Venous: 2.6 mmol/L (ref 0.5–2.0)

## 2014-10-17 LAB — RAPID URINE DRUG SCREEN, HOSP PERFORMED
AMPHETAMINES: NOT DETECTED
BENZODIAZEPINES: POSITIVE — AB
Barbiturates: NOT DETECTED
Cocaine: NOT DETECTED
OPIATES: POSITIVE — AB
TETRAHYDROCANNABINOL: NOT DETECTED

## 2014-10-17 LAB — POCT I-STAT 3, ART BLOOD GAS (G3+)
ACID-BASE DEFICIT: 6 mmol/L — AB (ref 0.0–2.0)
BICARBONATE: 20.7 meq/L (ref 20.0–24.0)
O2 Saturation: 93 %
PH ART: 7.284 — AB (ref 7.350–7.450)
PO2 ART: 75 mmHg — AB (ref 80.0–100.0)
TCO2: 22 mmol/L (ref 0–100)
pCO2 arterial: 43.7 mmHg (ref 35.0–45.0)

## 2014-10-17 LAB — BASIC METABOLIC PANEL
Anion gap: 9 (ref 5–15)
BUN: 42 mg/dL — AB (ref 6–20)
CALCIUM: 7.7 mg/dL — AB (ref 8.9–10.3)
CO2: 22 mmol/L (ref 22–32)
Chloride: 107 mmol/L (ref 101–111)
Creatinine, Ser: 4.36 mg/dL — ABNORMAL HIGH (ref 0.44–1.00)
GFR calc Af Amer: 11 mL/min — ABNORMAL LOW (ref >60)
GFR, EST NON AFRICAN AMERICAN: 10 mL/min — AB (ref >60)
Glucose, Bld: 158 mg/dL — ABNORMAL HIGH (ref 65–99)
Potassium: 4.5 mmol/L (ref 3.5–5.1)
Sodium: 138 mmol/L (ref 135–145)

## 2014-10-17 LAB — ACETAMINOPHEN LEVEL: Acetaminophen (Tylenol), Serum: <10 ug/mL — ABNORMAL LOW (ref 10–30)

## 2014-10-17 LAB — PROCALCITONIN: PROCALCITONIN: 1.5 ng/mL

## 2014-10-17 LAB — GLUCOSE, CAPILLARY
GLUCOSE-CAPILLARY: 163 mg/dL — AB (ref 65–99)
Glucose-Capillary: 151 mg/dL — ABNORMAL HIGH (ref 65–99)

## 2014-10-17 LAB — MRSA PCR SCREENING: MRSA by PCR: NEGATIVE

## 2014-10-17 MED ORDER — CHLORHEXIDINE GLUCONATE 0.12 % MT SOLN
15.0000 mL | Freq: Two times a day (BID) | OROMUCOSAL | Status: DC
  Administered 2014-10-17 – 2014-10-18 (×2): 15 mL via OROMUCOSAL
  Filled 2014-10-17 (×2): qty 15

## 2014-10-17 MED ORDER — MIDAZOLAM HCL 2 MG/2ML IJ SOLN: 1.0000 mg | INTRAMUSCULAR | Status: DC | PRN

## 2014-10-17 MED ORDER — SUCCINYLCHOLINE CHLORIDE 20 MG/ML IJ SOLN
INTRAMUSCULAR | Status: AC
Start: 2014-10-17 — End: 2014-10-17
  Administered 2014-10-17: 150 mg via INTRAVENOUS
  Filled 2014-10-17: qty 1

## 2014-10-17 MED ORDER — ETOMIDATE 2 MG/ML IV SOLN
20.0000 mg | Freq: Once | INTRAVENOUS | Status: AC
  Administered 2014-10-17: 20 mg via INTRAVENOUS

## 2014-10-17 MED ORDER — PANTOPRAZOLE SODIUM 40 MG IV SOLR: 40.0000 mg | Freq: Every day | INTRAVENOUS | Status: DC

## 2014-10-17 MED ORDER — SODIUM CHLORIDE 0.9 % IV SOLN
INTRAVENOUS | Status: DC
  Administered 2014-10-17 – 2014-10-19 (×3): via INTRAVENOUS

## 2014-10-17 MED ORDER — LEVOFLOXACIN IN D5W 750 MG/150ML IV SOLN
750.0000 mg | Freq: Once | INTRAVENOUS | Status: AC
  Administered 2014-10-17: 750 mg via INTRAVENOUS
  Filled 2014-10-17: qty 150

## 2014-10-17 MED ORDER — VECURONIUM BROMIDE 10 MG IV SOLR
INTRAVENOUS | Status: AC
  Filled 2014-10-17: qty 10

## 2014-10-17 MED ORDER — CETYLPYRIDINIUM CHLORIDE 0.05 % MT LIQD
7.0000 mL | Freq: Four times a day (QID) | OROMUCOSAL | Status: DC
  Administered 2014-10-18 (×2): 7 mL via OROMUCOSAL

## 2014-10-17 MED ORDER — LIDOCAINE HCL (CARDIAC) 20 MG/ML IV SOLN
INTRAVENOUS | Status: AC
  Filled 2014-10-17: qty 5

## 2014-10-17 MED ORDER — LEVOTHYROXINE SODIUM 75 MCG PO TABS
75.0000 ug | ORAL_TABLET | Freq: Every day | ORAL | Status: DC
  Administered 2014-10-18 – 2014-10-21 (×4): 75 ug via ORAL
  Filled 2014-10-17 (×6): qty 1

## 2014-10-17 MED ORDER — SODIUM CHLORIDE 0.9 % IV BOLUS (SEPSIS)
1000.0000 mL | Freq: Once | INTRAVENOUS | Status: AC
  Administered 2014-10-17: 1000 mL via INTRAVENOUS

## 2014-10-17 MED ORDER — PANTOPRAZOLE SODIUM 40 MG IV SOLR
40.0000 mg | Freq: Every day | INTRAVENOUS | Status: DC
  Administered 2014-10-17: 40 mg via INTRAVENOUS
  Filled 2014-10-17 (×2): qty 40

## 2014-10-17 MED ORDER — PIPERACILLIN-TAZOBACTAM 3.375 G IVPB 30 MIN
3.3750 g | Freq: Once | INTRAVENOUS | Status: AC
  Administered 2014-10-17: 3.375 g via INTRAVENOUS
  Filled 2014-10-17: qty 50

## 2014-10-17 MED ORDER — PIPERACILLIN-TAZOBACTAM IN DEX 2-0.25 GM/50ML IV SOLN
2.2500 g | Freq: Four times a day (QID) | INTRAVENOUS | Status: DC
  Filled 2014-10-17 (×2): qty 50

## 2014-10-17 MED ORDER — ROCURONIUM BROMIDE 50 MG/5ML IV SOLN
INTRAVENOUS | Status: AC
Start: 2014-10-17 — End: 2014-10-17
  Filled 2014-10-17: qty 2

## 2014-10-17 MED ORDER — SODIUM CHLORIDE 0.9 % IV SOLN
25.0000 ug/h | INTRAVENOUS | Status: DC
  Administered 2014-10-17: 50 ug/h via INTRAVENOUS
  Filled 2014-10-17 (×2): qty 50

## 2014-10-17 MED ORDER — PROPOFOL 1000 MG/100ML IV EMUL
INTRAVENOUS | Status: AC
  Administered 2014-10-17: 5 ug/kg/min via INTRAVENOUS
  Filled 2014-10-17: qty 100

## 2014-10-17 MED ORDER — ALBUTEROL SULFATE (2.5 MG/3ML) 0.083% IN NEBU: 2.5000 mg | INHALATION_SOLUTION | RESPIRATORY_TRACT | Status: DC | PRN

## 2014-10-17 MED ORDER — ACETAMINOPHEN 650 MG RE SUPP
RECTAL | Status: AC
  Filled 2014-10-17: qty 1

## 2014-10-17 MED ORDER — HEPARIN SODIUM (PORCINE) 5000 UNIT/ML IJ SOLN
5000.0000 [IU] | Freq: Three times a day (TID) | INTRAMUSCULAR | Status: DC
  Administered 2014-10-17 – 2014-10-21 (×12): 5000 [IU] via SUBCUTANEOUS
  Filled 2014-10-17 (×14): qty 1

## 2014-10-17 MED ORDER — PROPOFOL 1000 MG/100ML IV EMUL
5.0000 ug/kg/min | Freq: Once | INTRAVENOUS | Status: DC
  Administered 2014-10-17: 5 ug/kg/min via INTRAVENOUS

## 2014-10-17 MED ORDER — SODIUM CHLORIDE 0.9 % IV SOLN
Freq: Once | INTRAVENOUS | Status: AC
  Administered 2014-10-17: 14:00:00 via INTRAVENOUS

## 2014-10-17 MED ORDER — PROPOFOL 10 MG/ML IV BOLUS
100.0000 mg | Freq: Once | INTRAVENOUS | Status: AC
  Administered 2014-10-17: 100 mg via INTRAVENOUS

## 2014-10-17 MED ORDER — NALOXONE HCL 1 MG/ML IJ SOLN
2.0000 mg | Freq: Once | INTRAMUSCULAR | Status: AC
  Administered 2014-10-17: 2 mg via INTRAMUSCULAR

## 2014-10-17 MED ORDER — LEVOFLOXACIN IN D5W 500 MG/100ML IV SOLN
500.0000 mg | INTRAVENOUS | Status: DC
  Filled 2014-10-17: qty 100

## 2014-10-17 MED ORDER — PROPOFOL 1000 MG/100ML IV EMUL: 5.0000 ug/kg/min | Freq: Once | INTRAVENOUS | Status: DC

## 2014-10-17 MED ORDER — SUCCINYLCHOLINE CHLORIDE 20 MG/ML IJ SOLN
150.0000 mg | Freq: Once | INTRAMUSCULAR | Status: AC
  Administered 2014-10-17: 150 mg via INTRAVENOUS

## 2014-10-17 MED ORDER — VANCOMYCIN HCL IN DEXTROSE 1-5 GM/200ML-% IV SOLN: 1000.0000 mg | INTRAVENOUS | Status: DC

## 2014-10-17 MED ORDER — ACETAMINOPHEN 650 MG RE SUPP
650.0000 mg | Freq: Once | RECTAL | Status: AC
  Administered 2014-10-17: 650 mg via RECTAL

## 2014-10-17 MED ORDER — VANCOMYCIN HCL 10 G IV SOLR
1500.0000 mg | Freq: Once | INTRAVENOUS | Status: AC
  Administered 2014-10-17: 1500 mg via INTRAVENOUS
  Filled 2014-10-17: qty 1500

## 2014-10-17 MED ORDER — ONDANSETRON HCL 4 MG/2ML IJ SOLN
4.0000 mg | Freq: Four times a day (QID) | INTRAMUSCULAR | Status: DC | PRN
  Administered 2014-10-18: 4 mg via INTRAVENOUS
  Filled 2014-10-17 (×2): qty 2

## 2014-10-17 MED ORDER — VECURONIUM BROMIDE 10 MG IV SOLR
10.0000 mg | Freq: Once | INTRAVENOUS | Status: AC
  Administered 2014-10-17: 10 mg via INTRAVENOUS

## 2014-10-17 MED ORDER — ETOMIDATE 2 MG/ML IV SOLN
INTRAVENOUS | Status: AC
Start: 2014-10-17 — End: 2014-10-17
  Filled 2014-10-17: qty 20

## 2014-10-17 MED ORDER — FENTANYL BOLUS VIA INFUSION
25.0000 ug | INTRAVENOUS | Status: DC | PRN
  Filled 2014-10-17: qty 25

## 2014-10-17 NOTE — ED Notes (Signed)
Continuing to assist pt with ventilations with ambu bag. sats 100%
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
## 2014-10-17 NOTE — ED Notes (Signed)
Reported lactic acid critical value to don at [facility].
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
## 2014-10-17 NOTE — Progress Notes (Signed)
Increased set rate to 18 post ABG and per order Nickolas Madrid, NP

## 2014-10-17 NOTE — ED Notes (Signed)
Pt moving some .  Moaning after narcan.

## 2014-10-17 NOTE — ED Notes (Signed)
Family at bedside, updated.  ?

## 2014-10-17 NOTE — ED Notes (Signed)
CRITICAL VALUE ALERT  Critical value received:  Potassium 6.3  Date of notification:  10/17/2014  Time of notification:  1219  Critical value read back: POTASSIUM 6.3  Nurse who received alert:  Charmayne Sheer, RN      (Primary RN made aware)  MD notified (1st page):  Alyson Locket
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
## 2014-10-17 NOTE — ED Notes (Signed)
Updated don at [facility]c on status of pt.

## 2014-10-17 NOTE — H&P (Signed)
PULMONARY / CRITICAL CARE MEDICINE   Name: [name] MRN: [id number] DOB: [birth year]    ADMISSION DATE:  10/17/2014  REFERRING MD :  [facility]   CHIEF COMPLAINT:  OD  INITIAL PRESENTATION:  70yo female with hx HTN, GERD, anxiety who presented 5/19 to [facility] with drug OD.  Per husband she took approx half a bottle of codeine cough syrup.  Was unresponsive, in respiratory distress, intubated in ER.  Found to have acute renal failure and tx to [facility].    STUDIES:  SIGNIFICANT EVENTS:    HISTORY OF PRESENT ILLNESS:  69yo female with hx HTN, GERD, anxiety who presented 5/19 to [facility] with drug OD.  Per husband she took approx half a bottle of codeine cough syrup, also takes valium. Was unresponsive, in respiratory distress, intubated in ER.  Found to have acute renal failure and tx to [facility].  No further history could be obtained due to intubation.  PAST MEDICAL HISTORY :   has a past medical history of Arteriosclerotic cardiovascular disease (ASCVD) (2003); Hyperlipidemia; Hypertension; Hypothyroidism; Blindness (1973); Tobacco abuse, in remission; Gastroesophageal reflux disease; Anxiety; Obesity; and Eosinophilic gastroenteritis (2011).  has past surgical history that includes Colonoscopy (11/2005); Esophagogastroduodenoscopy (11/2005); Cholecystectomy; Abdominal hysterectomy; Eye surgery; Lumbar spine surgery; and Appendectomy. Prior to Admission medications   Medication Sig Start Date End Date Taking? Authorizing Provider  acetaminophen (TYLENOL) 650 MG CR tablet Take 1,300 mg by mouth every 8 (eight) hours as needed for pain.   Yes Historical Provider, MD  albuterol (PROVENTIL HFA;VENTOLIN HFA) 108 (90 BASE) MCG/ACT inhaler Inhale 1-2 puffs into the lungs every 6 (six) hours as needed for wheezing or shortness of breath. 06/14/14  Yes Milton Ferguson, MD  amitriptyline (ELAVIL) 50 MG tablet Take 50 mg by mouth at bedtime.   Yes Historical Provider, MD  amLODipine (NORVASC) 10 MG  tablet Take 10 mg by mouth at bedtime.  07/25/12  Yes Historical Provider, MD  aspirin 81 MG tablet Take 81 mg by mouth at bedtime.    Yes Historical Provider, MD  atorvastatin (LIPITOR) 40 MG tablet Take 1 tablet (40 mg total) by mouth daily at 6 PM. 05/02/14  Yes Arnoldo Lenis, MD  diazepam (VALIUM) 5 MG tablet Take 5 mg by mouth 2 (two) times daily as needed for anxiety. For anxiety   Yes Historical Provider, MD  HYDROcodone-homatropine (HYCODAN) 5-1.5 MG/5ML syrup Take 5 mLs by mouth every 8 (eight) hours as needed for cough.   Yes Historical Provider, MD  levofloxacin (LEVAQUIN) 500 MG tablet Take 500 mg by mouth daily.   Yes Historical Provider, MD  levothyroxine (SYNTHROID, LEVOTHROID) 75 MCG tablet Take 75 mcg by mouth daily before breakfast.   Yes Historical Provider, MD  losartan-hydrochlorothiazide (HYZAAR) 100-12.5 MG per tablet Take 1 tablet by mouth at bedtime.    Yes Historical Provider, MD  metoprolol succinate (TOPROL-XL) 50 MG 24 hr tablet TAKE ONE TABLET BY MOUTH ONCE DAILY  WITH OR IMMEDIATELY FOLLOWING A MEAL 08/23/14  Yes Arnoldo Lenis, MD  Omega-3 Fatty Acids (FISH OIL PO) Take 1 capsule by mouth daily.   Yes Historical Provider, MD  pantoprazole (PROTONIX) 40 MG tablet Take 1 tablet (40 mg total) by mouth 2 (two) times daily before a meal. 07/03/14  Yes Mahala Menghini, PA-C  potassium chloride SA (K-DUR,KLOR-CON) 20 MEQ tablet Take 20 mEq by mouth 2 (two) times daily.   Yes Historical Provider, MD  predniSONE (STERAPRED UNI-PAK 21 TAB) 10  MG (21) TBPK tablet Take 10 mg by mouth See admin instructions. Day 1: Two tablets before breakfast, one after lunch, one after dinner, and two at bedtime. If started late in the day, take two tablets every hour for three hours, unless otherwise directed by prescriber. Day 2: One tablet before breakfast, one after lunch, one after dinner, and two at bedtime Day 3: One tablet before breakfast, one after lunch, one after dinner, and one at  bedtime Day 4: One tablet before breakfast, one after lunch, and one at bedtime Day 5: One tablet before breakfast and one at bedtime Day 6: One tablet before breakfast   Yes Historical Provider, MD  dicyclomine (BENTYL) 10 MG capsule Take 1 capsule (10 mg total) by mouth 4 (four) times daily -  before meals and at bedtime. Patient not taking: Reported on 10/17/2014 02/26/14   Orvil Feil, NP  omeprazole-sodium bicarbonate (ZEGERID) 40-1100 MG per capsule Take 1 capsule by mouth daily before breakfast. Patient not taking: Reported on 10/17/2014 08/22/12   Orvil Feil, NP   No Active Allergies  FAMILY HISTORY:  indicated that her mother is deceased. She indicated that her father is deceased. She indicated that her sister is alive. She indicated that her brother is alive.  SOCIAL HISTORY:  reports that she quit smoking about 12 years ago. She has never used smokeless tobacco. She reports that she does not drink alcohol or use illicit drugs.  REVIEW OF SYSTEMS:  Pt sedated on vent.  ROS obtained from records RN and records.   SUBJECTIVE:   VITAL SIGNS: Temp:  [100.8 F (38.2 C)-102 F (38.9 C)] 100.8 F (38.2 C) (05/19 1357) Pulse Rate:  [27-66] 61 (05/19 1400) Resp:  [13-26] 23 (05/19 1400) BP: (90-157)/(33-118) 102/50 mmHg (05/19 1400) SpO2:  [86 %-100 %] 92 % (05/19 1400) FiO2 (%):  [100 %] 100 % (05/19 1102) Weight:  [220 lb 7.4 oz (100.001 kg)] 220 lb 7.4 oz (100.001 kg) (05/19 1120) HEMODYNAMICS:   VENTILATOR SETTINGS: Vent Mode:  [-] PRVC FiO2 (%):  [100 %] 100 % Set Rate:  [14 bmp] 14 bmp Vt Set:  [380 mL] 380 mL PEEP:  [5 cmH20] 5 cmH20 Plateau Pressure:  [16 cmH20] 16 cmH20 INTAKE / OUTPUT:  Intake/Output Summary (Last 24 hours) at 10/17/14 1452 Last data filed at 10/17/14 1412  Gross per 24 hour  Intake      0 ml  Output    125 ml  Net   -125 ml    PHYSICAL EXAMINATION: General:  Sedated on vent Neuro:  Sedated but wakes to touch, grimaces, withdraws all  four HEENT:  NCAT, ETT in place Eyes: chronic appearing scleritis left eye, cannot open R eye Cardiovascular:  RRR, no mgr Lungs:  CTA B, vented effort GI: BS+, soft, nontender Musculoskeletal:  Normal bulk and tone Skin:  No skin breakdown  LABS:  CBC  Recent Labs Lab 10/17/14 1109  WBC 9.8  HGB 10.1*  HCT 33.0*  PLT 323   Coag's No results for input(s): APTT, INR in the last 168 hours. BMET  Recent Labs Lab 10/17/14 1109  NA 132*  K 6.3*  CL 98*  CO2 22  BUN 47*  CREATININE 5.40*  GLUCOSE 186*   Electrolytes  Recent Labs Lab 10/17/14 1109  CALCIUM 8.2*   Sepsis Markers No results for input(s): LATICACIDVEN, PROCALCITON, O2SATVEN in the last 168 hours. ABG  Recent Labs Lab 10/17/14 1205  PHART 7.205*  PCO2ART 57.4*  PO2ART 171*   Liver Enzymes  Recent Labs Lab 10/17/14 1109  AST 244*  ALT 100*  ALKPHOS 75  BILITOT 0.7  ALBUMIN 3.3*   Cardiac Enzymes No results for input(s): TROPONINI, PROBNP in the last 168 hours. Glucose No results for input(s): GLUCAP in the last 168 hours.  Imaging CXR 5/19 images reviewed> ETT, R IJ in place, no infiltrate EKG: NSR, QTc 402   ASSESSMENT / PLAN:  PULMONARY OETT 5/19>>> Acute respiratory failure r/t drug OD  Respiratory acidosis  P:   Vent support - 8cc/kg  F/u CXR  F/u ABG, repeat on arrival Daily SBT once mental status allows  PRN BD   CARDIOVASCULAR CVL R IJ ([place]) 5/19>>> Hx HTN  Hypotension  P:  Check lactate, pct  EKG  Trend troponin  Gentle volume  tele  RENAL Acute renal failure  Hyperkalemia  Hyponatremia  P:   Repeat chem  IVF NS 794IA/XK Check salicylate level  GASTROINTESTINAL Abnormal LFT's > uncertain etiology P:   NPO  PPI  F/u LFT's  STAT APAP level  HEMATOLOGIC No active issue  P:  SQ heparin   INFECTIOUS A:  No acute issues P:   BCx2 5/19>>> UC 5/19>>>   ENDOCRINE Hyperglycemia  P:   SSI   NEUROLOGIC AMS - r/t drug OD  Drug  OD - codeine cough syrup  P:   RASS goal: -1 propofol Daily WUA  As above, check salicylate level, APAP level   FAMILY  - Updates:  None available   My cc time 45 minutes  Roselie Awkward, MD [place] [facility] Pager: [phone] Cell: [phone] If no response, call [phone]

## 2014-10-17 NOTE — ED Notes (Signed)
Pt intubated by Dr. Jeneen Rinks  with 7.5 ET  tube. Taped 20 at the lip.  Auscultated bilateral breath sounds.  Called for portable chest.  12 f OG tube placed by Dr. Jeneen Rinks

## 2014-10-17 NOTE — Progress Notes (Signed)
Rec'd pt from [facility].  Placed pt on reported vent settings, waiting on MD to round for vent setting orders.  Pt appears to tol well so far.  VSS,  Sat 98% on 50% fio2. RN aware.

## 2014-10-17 NOTE — Procedures (Signed)
Sputum sent by RN.

## 2014-10-17 NOTE — ED Notes (Signed)
Notified dr. Jeneen Rinks of 6.3 potassium level.

## 2014-10-17 NOTE — ED Notes (Addendum)
diprovan paused at this time by verbal order from dr. Jeneen Rinks

## 2014-10-17 NOTE — ED Notes (Signed)
Pt had airway placed with positive color change and bilateral breath sounds verified by Dr. Jeneen Rinks and RT.

## 2014-10-17 NOTE — ED Notes (Signed)
CRITICAL VALUE ALERT  Critical value received:  Co2 57.4 Rest of the values:  PH 7.20, Po2 171, Bicarb 21.8, So2 97.9  Date of notification:  10/17/2014  Time of notification:  3361  Critical value read back:Yes.    Nurse who received alert:  [facility]  MD notified (1st page):  Dr. Jeneen Rinks  Time of first page:  12  MD notified (2nd page):  Time of second page:  Responding MD:  Dr. Jeneen Rinks  Time MD responded:  1240

## 2014-10-17 NOTE — Progress Notes (Signed)
[facility] P[facility]M PROGRESS NOTE  S: Family now present at bedside. Husband reports that she had been feeling sick for about a week.  She had non-productive cough and subjective fevers for several days. She presented to PCP 5/17 with these complaints. According to husband she was told she had a cold, and was prescribed a "pill" (probably Levaquin and prednisone based on med rec) and some codeine cough syrup. They were planning to go to the beach on Sunday and she was trying to get better faster, so she took more of the cough syrup than she was prescribed in an attempt to do so. She had only had a couple of doses before 5/18 PM. And when her husband joined her in bed that night he noticed that half the bottle was gone, and she appeared to be "passed out". He went to sleep, and woke up on the morning of 5/19 to her breathing "like a fish out of water", with vomitus in and around her mouth. He called EMS right away.   Of note, she is legally blind in both eyes, She was also noted to have a bottle of Tylenol on bedside table, but it is unknown how much she took, if any.   O: BP 116/52 mmHg  Pulse 63  Temp(Src) 98.5 F (36.9 C) (Oral)  Resp 17  Ht 5\' 1"  (1.549 m)  Wt 93.8 kg (206 lb 12.7 oz)  BMI 39.09 kg/m2  SpO2 92%  General:  Sedated, on Vent HEENT:  [place]/AT, PERRL, no JVD Cardiovascular:  Ventricular trigeminy  Lungs:  Scant bilateral rhonchi Abdomen:  Soft, non-tender, non-distended Musculoskeletal:  ROM intact, no acute deformity Skin:  Intact, MMM   A/P:  Acute respiratory failure - continue current therapy   Ventricular Trigeminy  - EKG, monitor  - STAT Bmet  Transaminitis ? Tylenol Overdose - Await APAP level - Mucomyst if elevated  CAP was being treated prior to presentation Suspect aspiration PNA - IV Levaquin 5/19 >>> - Blood Cx 5/19 >>> - Obtain sputum Cx 5/19 >>> - CXR in AM  Georgann Housekeeper, [place] [facility] Pager [phone] or [phone]

## 2014-10-17 NOTE — ED Notes (Signed)
Pt unresponsive with EMS.  Husband called ems with overdose hydrocodone.

## 2014-10-17 NOTE — Progress Notes (Signed)
istat ABG results not crossing over into epic, ABG results given to Nickolas Madrid, NP.  ABG results on vent: PRVC rate 14, vt 380, 5 peep, 50% fio2.   Results: pH 7.284, PCO2 43.7, PO2 75, HCO3 20.7, Base deficit -6, sat 93%.

## 2014-10-18 ENCOUNTER — Inpatient Hospital Stay (HOSPITAL_COMMUNITY): Payer: Medicare Other

## 2014-10-18 DIAGNOSIS — J219 Acute bronchiolitis, unspecified: Secondary | ICD-10-CM

## 2014-10-18 DIAGNOSIS — T50901S Poisoning by unspecified drugs, medicaments and biological substances, accidental (unintentional), sequela: Secondary | ICD-10-CM

## 2014-10-18 DIAGNOSIS — G9340 Encephalopathy, unspecified: Secondary | ICD-10-CM

## 2014-10-18 LAB — CBC
HCT: 28.3 % — ABNORMAL LOW (ref 36.0–46.0)
HEMOGLOBIN: 8.8 g/dL — AB (ref 12.0–15.0)
MCH: 29.7 pg (ref 26.0–34.0)
MCHC: 31.1 g/dL (ref 30.0–36.0)
MCV: 95.6 fL (ref 78.0–100.0)
PLATELETS: 200 10*3/uL (ref 150–400)
RBC: 2.96 MIL/uL — ABNORMAL LOW (ref 3.87–5.11)
RDW: 16.9 % — ABNORMAL HIGH (ref 11.5–15.5)
WBC: 6.1 10*3/uL (ref 4.0–10.5)

## 2014-10-18 LAB — HEPATIC FUNCTION PANEL
ALT: 389 U/L — ABNORMAL HIGH (ref 14–54)
AST: 727 U/L — ABNORMAL HIGH (ref 15–41)
Albumin: 2.4 g/dL — ABNORMAL LOW (ref 3.5–5.0)
Alkaline Phosphatase: 55 U/L (ref 38–126)
Bilirubin, Direct: <0.1 mg/dL — ABNORMAL LOW (ref 0.1–0.5)
Total Bilirubin: 0.5 mg/dL (ref 0.3–1.2)
Total Protein: 8 g/dL (ref 6.5–8.1)

## 2014-10-18 LAB — BASIC METABOLIC PANEL
ANION GAP: 5 (ref 5–15)
BUN: 38 mg/dL — ABNORMAL HIGH (ref 6–20)
CHLORIDE: 108 mmol/L (ref 101–111)
CO2: 25 mmol/L (ref 22–32)
Calcium: 8 mg/dL — ABNORMAL LOW (ref 8.9–10.3)
Creatinine, Ser: 2.5 mg/dL — ABNORMAL HIGH (ref 0.44–1.00)
GFR calc Af Amer: 22 mL/min — ABNORMAL LOW (ref >60)
GFR calc non Af Amer: 19 mL/min — ABNORMAL LOW (ref >60)
Glucose, Bld: 155 mg/dL — ABNORMAL HIGH (ref 65–99)
POTASSIUM: 4.2 mmol/L (ref 3.5–5.1)
Sodium: 138 mmol/L (ref 135–145)

## 2014-10-18 LAB — GLUCOSE, CAPILLARY
GLUCOSE-CAPILLARY: 119 mg/dL — AB (ref 65–99)
Glucose-Capillary: 103 mg/dL — ABNORMAL HIGH (ref 65–99)
Glucose-Capillary: 136 mg/dL — ABNORMAL HIGH (ref 65–99)
Glucose-Capillary: 152 mg/dL — ABNORMAL HIGH (ref 65–99)

## 2014-10-18 LAB — LACTIC ACID, PLASMA: Lactic Acid, Venous: 1.1 mmol/L (ref 0.5–2.0)

## 2014-10-18 MED ORDER — METOPROLOL TARTRATE 1 MG/ML IV SOLN
2.5000 mg | INTRAVENOUS | Status: DC | PRN
  Administered 2014-10-19: 5 mg via INTRAVENOUS
  Filled 2014-10-18 (×2): qty 5

## 2014-10-18 MED ORDER — CHLORHEXIDINE GLUCONATE 0.12 % MT SOLN
15.0000 mL | Freq: Two times a day (BID) | OROMUCOSAL | Status: DC
  Administered 2014-10-19 – 2014-10-21 (×4): 15 mL via OROMUCOSAL
  Filled 2014-10-18 (×7): qty 15

## 2014-10-18 MED ORDER — FENTANYL CITRATE (PF) 100 MCG/2ML IJ SOLN: 12.5000 ug | INTRAMUSCULAR | Status: DC | PRN

## 2014-10-18 MED ORDER — CETYLPYRIDINIUM CHLORIDE 0.05 % MT LIQD
7.0000 mL | Freq: Two times a day (BID) | OROMUCOSAL | Status: DC
  Administered 2014-10-19 – 2014-10-20 (×4): 7 mL via OROMUCOSAL

## 2014-10-18 MED ORDER — PANTOPRAZOLE SODIUM 40 MG PO TBEC
40.0000 mg | DELAYED_RELEASE_TABLET | Freq: Two times a day (BID) | ORAL | Status: DC
  Administered 2014-10-18 – 2014-10-21 (×6): 40 mg via ORAL
  Filled 2014-10-18 (×6): qty 1

## 2014-10-18 MED ORDER — METOPROLOL TARTRATE 12.5 MG HALF TABLET
12.5000 mg | ORAL_TABLET | Freq: Two times a day (BID) | ORAL | Status: DC
  Administered 2014-10-18 – 2014-10-21 (×6): 12.5 mg via ORAL
  Filled 2014-10-18 (×7): qty 1

## 2014-10-18 MED ORDER — SACCHAROMYCES BOULARDII 250 MG PO CAPS
250.0000 mg | ORAL_CAPSULE | Freq: Two times a day (BID) | ORAL | Status: DC
  Administered 2014-10-18 – 2014-10-21 (×7): 250 mg via ORAL
  Filled 2014-10-18 (×8): qty 1

## 2014-10-18 NOTE — Progress Notes (Signed)
[facility] Progress Note Patient Name: OONA TRAMMEL DOB: 1945/08/14 MRN: [id number]   Date of Service  10/18/2014  HPI/Events of Note  Runs of tachy  eICU Interventions  Resume low dose lopressor Use IVprn      Intervention Category Intermediate Interventions: Arrhythmia - evaluation and management  Kaynen Minner V. 10/18/2014, 5:53 PM

## 2014-10-18 NOTE — Procedures (Signed)
Extubation Procedure Note  Patient Details:   Name: [name] DOB: 1946-04-14 MRN: [id number]   Airway Documentation:     Evaluation  O2 sats: stable throughout Complications: No apparent complications Patient did tolerate procedure well. Bilateral Breath Sounds: Diminished Suctioning: Airway Yes   Patient extubated to 6L nasal cannula per MD order.  Positive cuff leak noted.  No evidence of stridor.  Patient able to speak post extubation.  Sats currently 93%.  Incentive spirometry performed x5 with achieved goal of 750.  No apparent complications.    Philomena Doheny 10/18/2014, 9:33 AM

## 2014-10-18 NOTE — Progress Notes (Signed)
LFT results noted, doubled since yesterday.  Obtain acute hepatitis panel   Hannah Gens, NP-C [place] [facility] Pgr: [phone] or [phone]

## 2014-10-18 NOTE — H&P (Signed)
PULMONARY / CRITICAL CARE MEDICINE   Name: [name] MRN: [id number] DOB: 13-Apr-1946    ADMISSION DATE:  10/17/2014  REFERRING MD :  [facility]   CHIEF COMPLAINT:  OD  INITIAL PRESENTATION:  69yo female with hx HTN, GERD, anxiety who presented 5/19 to [facility] with drug OD.  Per husband she took approx half a bottle of codeine cough syrup.  Was unresponsive, in respiratory distress, intubated in ER.  Found to have acute renal failure and tx to [facility].    STUDIES:  SIGNIFICANT EVENTS:   HISTORY OF PRESENT ILLNESS:  69yo female with hx HTN, GERD, anxiety who presented 5/19 to [facility] with drug OD.  Per husband she took approx half a bottle of codeine cough syrup, also takes valium. Was unresponsive, in respiratory distress, intubated in ER.  Found to have acute renal failure and tx to [facility].  No further history could be obtained due to intubation.   SUBJECTIVE: doing well, family notes that she accidentally took too much of the codeine, they don't suspect this was intentional  VITAL SIGNS: Temp:  [97.8 F (36.6 C)-102 F (38.9 C)] 98.1 F (36.7 C) (05/20 0724) Pulse Rate:  [27-125] 73 (05/20 0800) Resp:  [13-26] 17 (05/20 0800) BP: (89-157)/(33-118) 104/75 mmHg (05/20 0800) SpO2:  [86 %-100 %] 92 % (05/20 0800) FiO2 (%):  [50 %-100 %] 50 % (05/20 0738) Weight:  [93.8 kg (206 lb 12.7 oz)-100.001 kg (220 lb 7.4 oz)] 94.7 kg (208 lb 12.4 oz) (05/20 0300) HEMODYNAMICS:   VENTILATOR SETTINGS: Vent Mode:  [-] PRVC FiO2 (%):  [50 %-100 %] 50 % Set Rate:  [14 bmp-18 bmp] 18 bmp Vt Set:  [380 mL] 380 mL PEEP:  [5 cmH20] 5 cmH20 Plateau Pressure:  [15 cmH20-20 cmH20] 20 cmH20 INTAKE / OUTPUT:  Intake/Output Summary (Last 24 hours) at 10/18/14 0910 Last data filed at 10/18/14 0800  Gross per 24 hour  Intake 1636.22 ml  Output   1915 ml  Net -278.78 ml    PHYSICAL EXAMINATION: General:  Awake on vent HENT: ETT in place PULM: CTA B CV RRR, no mgr GI no masses,  nontender MSK: normal bulk and tone Neuro: awake, follows commands for me, maew  LABS:  CBC  Recent Labs Lab 10/17/14 1109 10/17/14 1653 10/18/14 0400  WBC 9.8 7.4 6.1  HGB 10.1* 9.0* 8.8*  HCT 33.0* 28.9* 28.3*  PLT 323 210 200   Coag's No results for input(s): APTT, INR in the last 168 hours. BMET  Recent Labs Lab 10/17/14 1109 10/17/14 1557 10/18/14 0400  NA 132* 138 138  K 6.3* 4.5 4.2  CL 98* 107 108  CO2 22 22 25   BUN 47* 42* 38*  CREATININE 5.40* 4.36* 2.50*  GLUCOSE 186* 158* 155*   Electrolytes  Recent Labs Lab 10/17/14 1109 10/17/14 1557 10/18/14 0400  CALCIUM 8.2* 7.7* 8.0*   Sepsis Markers  Recent Labs Lab 10/17/14 1315 10/17/14 1653 10/17/14 1915 10/17/14 2346  LATICACIDVEN 2.6*  --  1.3 1.1  PROCALCITON  --  1.50  --   --    ABG  Recent Labs Lab 10/17/14 1205 10/17/14 1658  PHART 7.205* 7.284*  PCO2ART 57.4* 43.7  PO2ART 171* 75.0*   Liver Enzymes  Recent Labs Lab 10/17/14 1109 10/17/14 1557  AST 244* 481*  ALT 100* 196*  ALKPHOS 75 60  BILITOT 0.7 0.5  ALBUMIN 3.3* 2.5*   Cardiac Enzymes No results for input(s): TROPONINI, PROBNP in the  last 168 hours. Glucose  Recent Labs Lab 10/17/14 1539 10/17/14 1946 10/17/14 2345 10/18/14 0312 10/18/14 0723  GLUCAP 163* 151* 152* 136* 119*    Imaging CXR 5/19 images reviewed> ETT, R IJ in place, no infiltrate EKG: NSR, QTc 402   ASSESSMENT / PLAN:  PULMONARY OETT 5/19>>> Acute respiratory failure r/t drug OD  > doing well  Acute bronchitis P:   Extubate this morning See ID Prn albuterol  CARDIOVASCULAR CVL R IJ ([place]) 5/19>>> Hx HTN  Hypotension  P:  Continue Gentle volume  tele  RENAL Acute renal failure > improving P:   Continue NS at 75cc/hr Monitor BMET and UOP Replace electrolytes as needed  GASTROINTESTINAL  Abnormal LFT's > uncertain etiology, APAP normal P:   Repeat LFTs now Advance diet  HEMATOLOGIC No active issue  P:   SQ heparin   INFECTIOUS A:  No acute issues P:   BCx2 5/19>>> UC 5/19>>> Resp culture 5/19 >>> gpc pairs/chains  Levaquin > day 5/7   ENDOCRINE Hyperglycemia  P:   SSI   NEUROLOGIC Acute encephalopathy due to Accidental Drug OD - codeine cough syrup > resolved P:   RASS goal: 0 DCc propofol    FAMILY  - Updates:  None available   My cc time 35 minutes  Roselie Awkward, MD [place] [facility]M Pager: [phone] Cell: [phone] If no response, call [phone]

## 2014-10-19 DIAGNOSIS — J9600 Acute respiratory failure, unspecified whether with hypoxia or hypercapnia: Secondary | ICD-10-CM

## 2014-10-19 DIAGNOSIS — R05 Cough: Secondary | ICD-10-CM

## 2014-10-19 DIAGNOSIS — R740 Nonspecific elevation of levels of transaminase and lactic acid dehydrogenase [LDH]: Secondary | ICD-10-CM

## 2014-10-19 LAB — COMPREHENSIVE METABOLIC PANEL
ALBUMIN: 2.1 g/dL — AB (ref 3.5–5.0)
ALT: 592 U/L — AB (ref 14–54)
ANION GAP: 6 (ref 5–15)
AST: 735 U/L — ABNORMAL HIGH (ref 15–41)
Alkaline Phosphatase: 55 U/L (ref 38–126)
BILIRUBIN TOTAL: 0.5 mg/dL (ref 0.3–1.2)
BUN: 18 mg/dL (ref 6–20)
CALCIUM: 7.9 mg/dL — AB (ref 8.9–10.3)
CHLORIDE: 105 mmol/L (ref 101–111)
CO2: 27 mmol/L (ref 22–32)
Creatinine, Ser: 1.02 mg/dL — ABNORMAL HIGH (ref 0.44–1.00)
GFR calc Af Amer: >60 mL/min (ref >60)
GFR calc non Af Amer: 56 mL/min — ABNORMAL LOW (ref >60)
GLUCOSE: 92 mg/dL (ref 65–99)
Potassium: 3.6 mmol/L (ref 3.5–5.1)
Sodium: 138 mmol/L (ref 135–145)
Total Protein: 7.5 g/dL (ref 6.5–8.1)

## 2014-10-19 LAB — GLUCOSE, CAPILLARY: GLUCOSE-CAPILLARY: 84 mg/dL (ref 65–99)

## 2014-10-19 LAB — HEPATITIS PANEL, ACUTE
HCV Ab: NEGATIVE
HEP B C IGM: NONREACTIVE
Hep A IgM: NONREACTIVE
Hepatitis B Surface Ag: NEGATIVE

## 2014-10-19 LAB — CULTURE, RESPIRATORY

## 2014-10-19 LAB — TSH: TSH: 3.063 u[IU]/mL (ref 0.350–4.500)

## 2014-10-19 MED ORDER — HYDROCOD POLST-CPM POLST ER 10-8 MG/5ML PO SUER
5.0000 mL | Freq: Two times a day (BID) | ORAL | Status: DC | PRN
Start: 2014-10-19 — End: 2014-10-21

## 2014-10-19 MED ORDER — ARFORMOTEROL TARTRATE 15 MCG/2ML IN NEBU
15.0000 ug | INHALATION_SOLUTION | Freq: Two times a day (BID) | RESPIRATORY_TRACT | Status: DC
  Administered 2014-10-20 – 2014-10-21 (×3): 15 ug via RESPIRATORY_TRACT
  Filled 2014-10-19 (×7): qty 2

## 2014-10-19 MED ORDER — LEVOFLOXACIN IN D5W 500 MG/100ML IV SOLN
500.0000 mg | INTRAVENOUS | Status: DC
  Administered 2014-10-19: 500 mg via INTRAVENOUS
  Filled 2014-10-19 (×2): qty 100

## 2014-10-19 MED ORDER — BUDESONIDE 0.25 MG/2ML IN SUSP
0.5000 mg | Freq: Two times a day (BID) | RESPIRATORY_TRACT | Status: DC
  Administered 2014-10-21: 0.5 mg via RESPIRATORY_TRACT
  Filled 2014-10-19 (×9): qty 4

## 2014-10-19 NOTE — Progress Notes (Signed)
ANTIBIOTIC CONSULT NOTE  Pharmacy Consult for levaquin Indication: pneumonia  No Active Allergies  Patient Measurements: Height: 5\' 1"  (154.9 cm) Weight: 209 lb 3.5 oz (94.9 kg) IBW/kg (Calculated) : 47.8   Vital Signs: Temp: 98.3 F (36.8 C) (05/21 1230) Temp Source: Oral (05/21 1230) BP: 112/57 mmHg (05/21 1200) Pulse Rate: 44 (05/21 1200) Intake/Output from previous day: 05/20 0701 - 05/21 0700 In: 1928.6 [P.O.:120; I.V.:1748.6; NG/GT:60] Out: 1500 [Urine:1500] Intake/Output from this shift: Total I/O In: 300 [I.V.:300] Out: 425 [Urine:425]  Labs:  Recent Labs  10/17/14 1109 10/17/14 1557 10/17/14 1653 10/18/14 0400 10/19/14 0500  WBC 9.8  --  7.4 6.1  --   HGB 10.1*  --  9.0* 8.8*  --   PLT 323  --  210 200  --   CREATININE 5.40* 4.36*  --  2.50* 1.02*   Estimated Creatinine Clearance: 57 mL/min (by C-G formula based on Cr of 1.02). No results for input(s): VANCOTROUGH, VANCOPEAK, VANCORANDOM, GENTTROUGH, GENTPEAK, GENTRANDOM, TOBRATROUGH, TOBRAPEAK, TOBRARND, AMIKACINPEAK, AMIKACINTROU, AMIKACIN in the last 72 hours.   Microbiology: Recent Results (from the past 720 hour(s))  Culture, blood (routine x 2)     Status: None (Preliminary result)   Collection Time: 10/17/14 12:54 PM  Result Value Ref Range Status   Specimen Description BLOOD SITE NOT SPECIFIED DRAWN BY RN  Final   Special Requests BOTTLES DRAWN AEROBIC AND ANAEROBIC 5CC  Final   Culture NO GROWTH 2 DAYS  Final   Report Status PENDING  Incomplete  Culture, blood (routine x 2)     Status: None (Preliminary result)   Collection Time: 10/17/14  1:33 PM  Result Value Ref Range Status   Specimen Description BLOOD RIGHT ANTECUBITAL  Final   Special Requests BOTTLES DRAWN AEROBIC ONLY 10CC  Final   Culture NO GROWTH 2 DAYS  Final   Report Status PENDING  Incomplete  MRSA PCR Screening     Status: None   Collection Time: 10/17/14  3:41 PM  Result Value Ref Range Status   MRSA by PCR NEGATIVE  NEGATIVE Final    Comment:        The GeneXpert MRSA Assay (FDA approved for NASAL specimens only), is one component of a comprehensive MRSA colonization surveillance program. It is not intended to diagnose MRSA infection nor to guide or monitor treatment for MRSA infections.   Culture, respiratory (NON-Expectorated)     Status: None (Preliminary result)   Collection Time: 10/17/14  7:18 PM  Result Value Ref Range Status   Specimen Description TRACHEAL ASPIRATE  Final   Special Requests NONE  Final   Gram Stain   Final    ABUNDANT WBC PRESENT,BOTH PMN AND MONONUCLEAR RARE SQUAMOUS EPITHELIAL CELLS PRESENT FEW GRAM POSITIVE COCCI IN PAIRS IN CHAINS IN CLUSTERS Performed at [facility]    Culture   Final    NO GROWTH 1 DAY Performed at [facility]    Report Status PENDING  Incomplete   Assessment: 69 yo female admitted with accidental overdose of Hycodan cough syrup who required intubation. Was receiving treatment for CAP PTA with LVQ. Was started on vanc/Zosyn originally here, but then de-escalated back to [facility]. SCr improved from 2.5 yesterday to 1.02 today- CrCl now ~50-55mL/min.  WBC 6.1, afebrile. Patient has been extubated and is now on 6L [place].   Goal of Therapy:  Proper dosing based on renal and hepatic function  Plan:  -increase Levaquin to 500mg  IV q24h starting tonight at 1800 with  improvement in renal function -follow LOT, renal function, clinical progression  Torre Schaumburg D. Gaspar Fowle, PharmD, BCPS Clinical Pharmacist Pager: [phone] 10/19/2014 12:41 PM

## 2014-10-19 NOTE — Progress Notes (Signed)
PULMONARY / CRITICAL CARE MEDICINE   Name: STEPHONIE WILCOXEN MRN: [id number] DOB: [birth year]    ADMISSION DATE:  10/17/2014  REFERRING MD :  [facility]   CHIEF COMPLAINT:  OD  INITIAL PRESENTATION:  69 y/o female with hx HTN, GERD, anxiety who presented 5/19 to [facility] with drug OD.  Per husband she took approx half a bottle of codeine cough syrup.  Was unresponsive, in respiratory distress, intubated in ER.  Found to have acute renal failure and tx to [facility].    STUDIES: 5/19  Admit to [facility] after OD of codeine cough syrup, unresponsive / intubated.  Family did not think intentional OD 5/20  Extubated, LFT's rising   SIGNIFICANT EVENTS:   SUBJECTIVE:  Tolerated extubation.   Runs of tachycardia overnight, lopressor restarted.  LFT's rising.     VITAL SIGNS: Temp:  [98.3 F (36.8 C)-99.4 F (37.4 C)] 98.3 F (36.8 C) (05/21 0752) Pulse Rate:  [61-73] 66 (05/21 0900) Resp:  [16-29] 17 (05/21 0900) BP: (69-129)/(29-103) 69/43 mmHg (05/21 0900) SpO2:  [92 %-98 %] 98 % (05/21 0900) Weight:  [209 lb 3.5 oz (94.9 kg)] 209 lb 3.5 oz (94.9 kg) (05/21 0500)   HEMODYNAMICS:     VENTILATOR SETTINGS:     INTAKE / OUTPUT:  Intake/Output Summary (Last 24 hours) at 10/19/14 0957 Last data filed at 10/19/14 0900  Gross per 24 hour  Intake   1770 ml  Output   1675 ml  Net     95 ml    PHYSICAL EXAMINATION: General:  Obese female in NAD HENT: mm pink/mosit, no jvd PULM: CTA B CV: RRR, no mgr GI: no masses, nontender MSK: normal bulk and tone Neuro: awake/alert, pleasantly confused, MAE.  Easily reoriented  LABS:  CBC  Recent Labs Lab 10/17/14 1109 10/17/14 1653 10/18/14 0400  WBC 9.8 7.4 6.1  HGB 10.1* 9.0* 8.8*  HCT 33.0* 28.9* 28.3*  PLT 323 210 200   Coag's No results for input(s): APTT, INR in the last 168 hours.   BMET  Recent Labs Lab 10/17/14 1557 10/18/14 0400 10/19/14 0500  NA 138 138 138  K 4.5 4.2 3.6  CL 107 108 105  CO2 22 25 27   BUN 42*  38* 18  CREATININE 4.36* 2.50* 1.02*  GLUCOSE 158* 155* 92   Electrolytes  Recent Labs Lab 10/17/14 1557 10/18/14 0400 10/19/14 0500  CALCIUM 7.7* 8.0* 7.9*   Sepsis Markers  Recent Labs Lab 10/17/14 1315 10/17/14 1653 10/17/14 1915 10/17/14 2346  LATICACIDVEN 2.6*  --  1.3 1.1  PROCALCITON  --  1.50  --   --    ABG  Recent Labs Lab 10/17/14 1205 10/17/14 1658  PHART 7.205* 7.284*  PCO2ART 57.4* 43.7  PO2ART 171* 75.0*   Liver Enzymes  Recent Labs Lab 10/17/14 1557 10/18/14 1215 10/19/14 0500  AST 481* 727* 735*  ALT 196* 389* 592*  ALKPHOS 60 55 55  BILITOT 0.5 0.5 0.5  ALBUMIN 2.5* 2.4* 2.1*   Cardiac Enzymes No results for input(s): TROPONINI, PROBNP in the last 168 hours.   Glucose  Recent Labs Lab 10/17/14 1539 10/17/14 1946 10/17/14 2345 10/18/14 0312 10/18/14 0723 10/18/14 1132  GLUCAP 163* 151* 152* 136* 119* 103*    Imaging CXR 5/20 >> images reviewed, low lung volumes with mild basilar atx   ASSESSMENT / PLAN:  PULMONARY OETT 5/19 >> Acute Respiratory Failure r/t drug OD    Acute bronchitis P:   See ID PRN  albuterol  Pulmonary hygiene  Intermittent CXR  CARDIOVASCULAR CVL R IJ ([place]) 5/19 >> Hx HTN  Hypotension - resolved  P:  Tele monitoring  NS @ 75 ml/hr Lopressor 12.5 BID + PRN for HR >120 Hold home hyzaar , norvasc for now   RENAL Acute renal failure - resolved.  P:   Monitor BMET and UOP Replace electrolytes as needed  GASTROINTESTINAL  Abnormal LFT's  - uncertain etiology, APAP normal, ? Related to hypotension.  Acute hepatitis panel negative. P:   Trend LFT's Advance diet RUQ [place]  Will need to query family regarding ETOH use??  HEMATOLOGIC Anemia  P:  SQ heparin   INFECTIOUS A:   Acute Bronchitis  P:   BCx2 5/19 >> UC 5/19 >> Resp culture 5/19 >> gpc pairs/chains >>  Levaquin, day 6/7   ENDOCRINE Hyperglycemia  Hypothyroid  P:   SSI Continue synthroid   Assess TSH    NEUROLOGIC Acute Encephalopathy - due to accidental drug OD , codeine cough syrup, resolved P:   RASS goal: 0 Monitor neuro status Hold home elavil, valium    FAMILY  - Updates:  None available    Transfer to SDU and to [facility] as of 5/22 0700.  Monitor in SDU with rising LFT's   Noe Gens, NP-C [place] [facility] Pgr: [phone] or [phone]    PCCM ATTENDING: I have reviewed pt's initial presentation, consultants notes and hospital database in detail.  The above assessment and plan was formulated under my direction.  In summary: Severe cough > OD of opiate cough suppressants > intubation Her CXR is entirely clear but she continues to cough - mostly nonproductive Will treat as bronchitis. Also treat for GERD and cyclical cough Low dose cough suppressant resumed She may transfer out of ICU and to [facility] service  Her transaminases are elevated without clear explanation Repeat LFTs ordered for 5/22. Please make sure these are followed    Merton Border, MD;  PCCM service; Mobile [phone]

## 2014-10-19 NOTE — Progress Notes (Signed)
Report called to 6E. All questions answered. Patient VSS.

## 2014-10-20 DIAGNOSIS — J209 Acute bronchitis, unspecified: Secondary | ICD-10-CM

## 2014-10-20 DIAGNOSIS — R7401 Elevation of levels of liver transaminase levels: Secondary | ICD-10-CM | POA: Diagnosis present

## 2014-10-20 DIAGNOSIS — I2510 Atherosclerotic heart disease of native coronary artery without angina pectoris: Secondary | ICD-10-CM

## 2014-10-20 DIAGNOSIS — T50901D Poisoning by unspecified drugs, medicaments and biological substances, accidental (unintentional), subsequent encounter: Secondary | ICD-10-CM

## 2014-10-20 DIAGNOSIS — R740 Nonspecific elevation of levels of transaminase and lactic acid dehydrogenase [LDH]: Secondary | ICD-10-CM

## 2014-10-20 LAB — BASIC METABOLIC PANEL
ANION GAP: 5 (ref 5–15)
BUN: 10 mg/dL (ref 6–20)
CHLORIDE: 105 mmol/L (ref 101–111)
CO2: 29 mmol/L (ref 22–32)
Calcium: 7.7 mg/dL — ABNORMAL LOW (ref 8.9–10.3)
Creatinine, Ser: 0.83 mg/dL (ref 0.44–1.00)
GFR calc Af Amer: >60 mL/min (ref >60)
Glucose, Bld: 102 mg/dL — ABNORMAL HIGH (ref 65–99)
Potassium: 3.2 mmol/L — ABNORMAL LOW (ref 3.5–5.1)
SODIUM: 139 mmol/L (ref 135–145)

## 2014-10-20 LAB — HEPATIC FUNCTION PANEL
ALK PHOS: 49 U/L (ref 38–126)
ALT: 344 U/L — AB (ref 14–54)
AST: 306 U/L — ABNORMAL HIGH (ref 15–41)
Albumin: 2.1 g/dL — ABNORMAL LOW (ref 3.5–5.0)
Bilirubin, Direct: 0.1 mg/dL (ref 0.1–0.5)
Indirect Bilirubin: 0.4 mg/dL (ref 0.3–0.9)
Total Bilirubin: 0.5 mg/dL (ref 0.3–1.2)
Total Protein: 7.3 g/dL (ref 6.5–8.1)

## 2014-10-20 LAB — CBC
HEMATOCRIT: 27.6 % — AB (ref 36.0–46.0)
Hemoglobin: 8.7 g/dL — ABNORMAL LOW (ref 12.0–15.0)
MCH: 29.6 pg (ref 26.0–34.0)
MCHC: 31.5 g/dL (ref 30.0–36.0)
MCV: 93.9 fL (ref 78.0–100.0)
Platelets: 211 10*3/uL (ref 150–400)
RBC: 2.94 MIL/uL — ABNORMAL LOW (ref 3.87–5.11)
RDW: 16.1 % — ABNORMAL HIGH (ref 11.5–15.5)
WBC: 5.6 10*3/uL (ref 4.0–10.5)

## 2014-10-20 MED ORDER — LEVOFLOXACIN 500 MG PO TABS
500.0000 mg | ORAL_TABLET | Freq: Every day | ORAL | Status: DC
  Administered 2014-10-20 – 2014-10-21 (×2): 500 mg via ORAL
  Filled 2014-10-20 (×2): qty 1

## 2014-10-20 MED ORDER — POTASSIUM CHLORIDE CRYS ER 20 MEQ PO TBCR
40.0000 meq | EXTENDED_RELEASE_TABLET | Freq: Once | ORAL | Status: AC
  Administered 2014-10-20: 40 meq via ORAL
  Filled 2014-10-20: qty 2

## 2014-10-20 MED ORDER — PREDNISONE 10 MG PO TABS
60.0000 mg | ORAL_TABLET | Freq: Every day | ORAL | Status: DC
  Administered 2014-10-20 – 2014-10-21 (×2): 60 mg via ORAL
  Filled 2014-10-20 (×3): qty 1

## 2014-10-20 MED ORDER — BISACODYL 10 MG RE SUPP
10.0000 mg | Freq: Once | RECTAL | Status: DC
  Filled 2014-10-20: qty 1
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
## 2014-10-21 LAB — COMPREHENSIVE METABOLIC PANEL
ALT: 255 U/L — ABNORMAL HIGH (ref 14–54)
ANION GAP: 7 (ref 5–15)
AST: 156 U/L — ABNORMAL HIGH (ref 15–41)
Albumin: 2.3 g/dL — ABNORMAL LOW (ref 3.5–5.0)
Alkaline Phosphatase: 50 U/L (ref 38–126)
BILIRUBIN TOTAL: 0.5 mg/dL (ref 0.3–1.2)
BUN: 11 mg/dL (ref 6–20)
CHLORIDE: 103 mmol/L (ref 101–111)
CO2: 27 mmol/L (ref 22–32)
Calcium: 8.1 mg/dL — ABNORMAL LOW (ref 8.9–10.3)
Creatinine, Ser: 0.77 mg/dL (ref 0.44–1.00)
GFR calc non Af Amer: >60 mL/min (ref >60)
Glucose, Bld: 140 mg/dL — ABNORMAL HIGH (ref 65–99)
POTASSIUM: 3.4 mmol/L — AB (ref 3.5–5.1)
Sodium: 137 mmol/L (ref 135–145)
TOTAL PROTEIN: 7.6 g/dL (ref 6.5–8.1)

## 2014-10-21 LAB — CBC
HCT: 28.5 % — ABNORMAL LOW (ref 36.0–46.0)
Hemoglobin: 9.4 g/dL — ABNORMAL LOW (ref 12.0–15.0)
MCH: 30.1 pg (ref 26.0–34.0)
MCHC: 33 g/dL (ref 30.0–36.0)
MCV: 91.3 fL (ref 78.0–100.0)
Platelets: 229 10*3/uL (ref 150–400)
RBC: 3.12 MIL/uL — ABNORMAL LOW (ref 3.87–5.11)
RDW: 15.7 % — ABNORMAL HIGH (ref 11.5–15.5)
WBC: 6.7 10*3/uL (ref 4.0–10.5)

## 2014-10-21 LAB — MAGNESIUM: Magnesium: 1.5 mg/dL — ABNORMAL LOW (ref 1.7–2.4)

## 2014-10-21 MED ORDER — FLUTICASONE-SALMETEROL 250-50 MCG/DOSE IN AEPB: 1.0000 | INHALATION_SPRAY | Freq: Two times a day (BID) | RESPIRATORY_TRACT | Status: DC

## 2014-10-21 MED ORDER — POTASSIUM CHLORIDE CRYS ER 20 MEQ PO TBCR: 20.0000 meq | EXTENDED_RELEASE_TABLET | Freq: Every day | ORAL | Status: DC

## 2014-10-21 MED ORDER — MAGNESIUM OXIDE 400 MG PO TABS: 400.0000 mg | ORAL_TABLET | Freq: Two times a day (BID) | ORAL | Status: DC

## 2014-10-21 MED ORDER — LEVOFLOXACIN 500 MG PO TABS: 500.0000 mg | ORAL_TABLET | Freq: Every day | ORAL | Status: DC

## 2014-10-21 MED ORDER — FLEET ENEMA 7-19 GM/118ML RE ENEM
1.0000 | ENEMA | Freq: Once | RECTAL | Status: AC
  Administered 2014-10-21: 1 via RECTAL
  Filled 2014-10-21: qty 1

## 2014-10-21 MED ORDER — POTASSIUM CHLORIDE CRYS ER 20 MEQ PO TBCR
40.0000 meq | EXTENDED_RELEASE_TABLET | Freq: Once | ORAL | Status: AC
  Administered 2014-10-21: 40 meq via ORAL
  Filled 2014-10-21: qty 2

## 2014-10-21 MED ORDER — PREDNISONE 20 MG PO TABS: ORAL_TABLET | ORAL | Status: DC

## 2014-10-21 MED ORDER — MAGNESIUM SULFATE IN D5W 10-5 MG/ML-% IV SOLN
1.0000 g | Freq: Once | INTRAVENOUS | Status: AC
Start: 2014-10-21 — End: 2014-10-21
  Administered 2014-10-21: 1 g via INTRAVENOUS
  Filled 2014-10-21: qty 100

## 2014-10-21 MED ORDER — SACCHAROMYCES BOULARDII 250 MG PO CAPS: 250.0000 mg | ORAL_CAPSULE | Freq: Two times a day (BID) | ORAL | Status: DC

## 2014-10-21 NOTE — Evaluation (Signed)
Physical Therapy Evaluation Patient Details Name: [name] MRN: [id number] DOB: [birth year] Today's Date: 10/21/2014   History of Present Illness  69yo female with hx HTN, GERD, anxiety who presented 5/19 to [facility] with drug OD. Per husband she took approx half a bottle of codeine cough syrup. Was unresponsive, in respiratory distress, intubated in ER. Found to have acute renal failure and tx to [facility].   Clinical Impression   Pt admitted with above diagnosis. Pt currently with functional limitations due to the deficits listed below (see PT Problem List).  Pt will benefit from skilled PT to increase their independence and safety with mobility to allow discharge to the venue listed below.       Follow Up Recommendations Other (comment) [facility]; hopes to wean off supplemental O2)    Equipment Recommendations   (Oxygen)    Recommendations for Other Services       Precautions / Restrictions Precautions Precautions: Fall Restrictions Weight Bearing Restrictions: No      Mobility  Bed Mobility Overal bed mobility: Modified Independent                Transfers Overall transfer level: Needs assistance Equipment used: 1 person hand held assist;Rolling walker (2 wheeled) Transfers: Sit to/from Stand Sit to Stand: Min guard         General transfer comment: pt blind and requires support for balance/safety. Pt unsteady and also trialed with RW. Verbal and physical cues for directions/guiding  Ambulation/Gait Ambulation/Gait assistance: Min assist;Mod assist Ambulation Distance (Feet): 110 Feet Assistive device: Rolling walker (2 wheeled);1 person hand held assist Gait Pattern/deviations: Decreased step length - right;Decreased step length - left;Decreased stride length   Gait velocity interpretation: Below normal speed for age/gender General Gait Details: Verbal cues for pathfinding; General unsteadiness with gait; difficult to tease out if this is due to being  unsure in a different environment, or weakness/unsteadiness after bedrest; occasional mod assis tfor balance  Stairs            Wheelchair Mobility    Modified Rankin (Stroke Patients Only)       Balance Overall balance assessment: Needs assistance Sitting-balance support: No upper extremity supported Sitting balance-Leahy Scale: Good     Standing balance support: Single extremity supported;During functional activity Standing balance-Leahy Scale: Fair                               Pertinent Vitals/Pain Pain Assessment: No/denies pain    Home Living Family/patient expects to be discharged to:: Private residence Living Arrangements: Spouse/significant other Available Help at Discharge: Family Type of Home: House Home Access: Stairs to enter Entrance Stairs-Rails: Right;Left;Can reach both Entrance Stairs-Number of Steps: 3 Home Layout: One level Home Equipment: Cane - single point;Walker - 2 wheels      Prior Function Level of Independence: Independent               Hand Dominance   Dominant Hand: Right    Extremity/Trunk Assessment   Upper Extremity Assessment: Defer to OT evaluation           Lower Extremity Assessment: Generalized weakness      Cervical / Trunk Assessment: Normal  Communication   Communication: No difficulties  Cognition Arousal/Alertness: Awake/alert Behavior During Therapy: WFL for tasks assessed/performed Overall Cognitive Status: Within Functional Limits for tasks assessed  General Comments General comments (skin integrity, edema, etc.): See other note of this date for O2 saturations with activiyt    Exercises        Assessment/Plan    PT Assessment Patient needs continued PT services  PT Diagnosis Generalized weakness   PT Problem List Decreased strength;Decreased activity tolerance;Decreased balance;Cardiopulmonary status limiting activity  PT Treatment  Interventions DME instruction;Gait training;Stair training;Functional mobility training;Patient/family education   PT Goals (Current goals can be found in the Care Plan section) Acute Rehab PT Goals Patient Stated Goal: go home and go to [facility] PT Goal Formulation: With patient Time For Goal Achievement: 10/28/14 Potential to Achieve Goals: Good    Frequency Min 3X/week   Barriers to discharge        Co-evaluation PT/OT/SLP Co-Evaluation/Treatment: Yes Reason for Co-Treatment: For patient/therapist safety PT goals addressed during session: Mobility/safety with mobility;Balance (energy expenditure with ambulation) OT goals addressed during session: ADL's and self-care       End of Session Equipment Utilized During Treatment: Oxygen Activity Tolerance: Patient tolerated treatment well Patient left: in chair;with call bell/phone within reach Nurse Communication: Mobility status         Time: 0211-1735 PT Time Calculation (min) (ACUTE ONLY): 37 min   Charges:   PT Evaluation $Initial PT Evaluation Tier I: 1 Procedure     PT G CodesQuin Hoop 10/21/2014, 1:42 PM  Roney Marion, [place] Pager [phone] Office [phone]

## 2014-10-21 NOTE — Discharge Summary (Signed)
[facility]  Physician Discharge Summary   Patient ID: [name] MRN: [id number] DOB/AGE: July 19, 1945 69 y.o.  Admit date: 10/17/2014 Discharge date: 10/21/2014  PCP: Delphina Cahill, MD  DISCHARGE DIAGNOSES:  Active Problems:   BLINDNESS   Arteriosclerotic cardiovascular disease (ASCVD)   Respiratory failure   Transaminitis   Acute bronchitis   RECOMMENDATIONS FOR OUTPATIENT FOLLOW UP: 1. Please check a metabolic panel and LFTs as outpatient. 2. She's been asked to hold her losartan/hydrochlorothiazide until seen in follow-up 3. Home oxygen and home health has been arranged.  DISCHARGE CONDITION: fair  Diet recommendation: heart healthy  Filed Weights   10/19/14 0500 10/19/14 1601 10/21/14 0540  Weight: 94.9 kg (209 lb 3.5 oz) 95.346 kg (210 lb 3.2 oz) 94.983 kg (209 lb 6.4 oz)    INITIAL HISTORY: 69 year old Caucasian female presented with the overdose and codeine. She was unresponsive in respiratory distress and had to be intubated. She was noted to have acute renal failure. She was transferred to [facility]. Patient was extubated on May 20. She has done well from a respiratory standpoint. She was also noted to have elevated LFTs.  Consultants: PCCM  Procedures: Central line right IJ 5/19  HOSPITAL COURSE:   Acute respiratory failure due to drug overdose. Patient took more codeine, then she should have. This resulted in acute respiratory failure. She denies any intent to harm herself. She was just trying to treat her cough. She was extubated and has done well. Oxygen was titrated. However, on room air her sats dropped to 82%. She likely has an element of chronic bronchitis and obesity hypoventilation syndrome. Home oxygen will be arranged.  Acute bronchitis. Improving. She was started on Levaquin, which will be continued for a few more days. Prednisone taper.   Transient hypotension This occurred in the intensive care unit. Now resolved. Blood pressure now  actually in the hypertensive range. She may resume her amlodipine, but hold off on the ARB and diuretic. She should do talk to her PCP before initiating these medications.  Transaminitis LFTs noted to be high. Acetaminophen level was normal. Could be related to shock liver from transient hypotension. Hepatitis panel negative. LFTs are improving. Can be pursued as an outpatient.  Acute encephalopathy This was secondary to drug OD. Now appears to be close to baseline.  Acute renal failure Likely due to hypovolemia. Resolved with IV fluids.  Normocytic anemia Stable. No overt bleeding.  History of hypothyroidism Continue with levothyroxine.  History of coronary artery disease Stable. Continue beta blocker. Resume aspirin.  Overall, stable. Patient was seen by PT and OT. She was able to ambulate. I think she will benefit from home health physical therapy, which will be arranged. Home oxygen will also be arranged. Discussed with her husband as well. Stable for discharge.  PERTINENT LABS:  The results of significant diagnostics from this hospitalization (including imaging, microbiology, ancillary and laboratory) are listed below for reference.    Microbiology: Recent Results (from the past 240 hour(s))  Culture, blood (routine x 2)     Status: None (Preliminary result)   Collection Time: 10/17/14 12:54 PM  Result Value Ref Range Status   Specimen Description BLOOD SITE NOT SPECIFIED DRAWN BY RN  Final   Special Requests BOTTLES DRAWN AEROBIC AND ANAEROBIC 5CC  Final   Culture NO GROWTH 4 DAYS  Final   Report Status PENDING  Incomplete  Culture, blood (routine x 2)     Status: None (Preliminary result)   Collection Time: 10/17/14  1:33 PM  Result Value Ref Range Status   Specimen Description BLOOD RIGHT ANTECUBITAL  Final   Special Requests BOTTLES DRAWN AEROBIC ONLY 10CC  Final   Culture NO GROWTH 4 DAYS  Final   Report Status PENDING  Incomplete  MRSA PCR Screening     Status:  None   Collection Time: 10/17/14  3:41 PM  Result Value Ref Range Status   MRSA by PCR NEGATIVE NEGATIVE Final    Comment:        The GeneXpert MRSA Assay (FDA approved for NASAL specimens only), is one component of a comprehensive MRSA colonization surveillance program. It is not intended to diagnose MRSA infection nor to guide or monitor treatment for MRSA infections.   Culture, respiratory (NON-Expectorated)     Status: None   Collection Time: 10/17/14  7:18 PM  Result Value Ref Range Status   Specimen Description TRACHEAL ASPIRATE  Final   Special Requests NONE  Final   Gram Stain   Final    ABUNDANT WBC PRESENT,BOTH PMN AND MONONUCLEAR RARE SQUAMOUS EPITHELIAL CELLS PRESENT FEW GRAM POSITIVE COCCI IN PAIRS IN CHAINS IN CLUSTERS Performed at [facility]    Culture   Final    Non-Pathogenic Oropharyngeal-type Flora Isolated. Performed at [facility]    Report Status 10/19/2014 FINAL  Final     Labs: Basic Metabolic Panel:  Recent Labs Lab 10/17/14 1557 10/18/14 0400 10/19/14 0500 10/20/14 0530 10/21/14 0544  NA 138 138 138 139 137  K 4.5 4.2 3.6 3.2* 3.4*  CL 107 108 105 105 103  CO2 22 25 27 29 27   GLUCOSE 158* 155* 92 102* 140*  BUN 42* 38* 18 10 11   CREATININE 4.36* 2.50* 1.02* 0.83 0.77  CALCIUM 7.7* 8.0* 7.9* 7.7* 8.1*  MG  --   --   --   --  1.5*   Liver Function Tests:  Recent Labs Lab 10/17/14 1557 10/18/14 1215 10/19/14 0500 10/20/14 0530 10/21/14 0544  AST 481* 727* 735* 306* 156*  ALT 196* 389* 592* 344* 255*  ALKPHOS 60 55 55 49 50  BILITOT 0.5 0.5 0.5 0.5 0.5  PROT 7.9 8.0 7.5 7.3 7.6  ALBUMIN 2.5* 2.4* 2.1* 2.1* 2.3*   No results for input(s): LIPASE, AMYLASE in the last 168 hours. No results for input(s): AMMONIA in the last 168 hours. CBC:  Recent Labs Lab 10/17/14 1109 10/17/14 1653 10/18/14 0400 10/20/14 0530 10/21/14 0544  WBC 9.8 7.4 6.1 5.6 6.7  NEUTROABS 6.9  --   --   --   --   HGB 10.1*  9.0* 8.8* 8.7* 9.4*  HCT 33.0* 28.9* 28.3* 27.6* 28.5*  MCV 99.1 95.4 95.6 93.9 91.3  PLT 323 210 200 211 229    CBG:  Recent Labs Lab 10/17/14 2345 10/18/14 0312 10/18/14 0723 10/18/14 1132 10/19/14 2218  GLUCAP 152* 136* 119* 103* 84     IMAGING STUDIES Dg Chest Port 1 View  10/18/2014   CLINICAL DATA:  Respiratory failure  EXAM: PORTABLE CHEST - 1 VIEW  COMPARISON:  10/17/2014  FINDINGS: Right central venous line and endotracheal tube as well as a nasogastric catheter are again noted and stable. Cardiac shadow is mildly enlarged but stable. Poor inspiratory effort is noted with this mild bibasilar atelectasis. No focal infiltrate or sizable effusion is seen.  IMPRESSION: Poor inspiratory effort.  Tubes and lines as described.   Electronically Signed   By: Inez Catalina M.D.   On: 10/18/2014 07:15  Dg Chest Portable 1 View  10/17/2014   CLINICAL DATA:  Drug overdose, endotracheal tube placement  EXAM: PORTABLE CHEST - 1 VIEW  COMPARISON:  08/19/2014  FINDINGS: Cardiomediastinal silhouette is stable. Central mild vascular congestion without pulmonary edema. NG tube in place. Endotracheal tube in place with tip 3 cm above the carina. No pneumothorax.  IMPRESSION: Central mild vascular congestion without pulmonary edema. NG tube and endotracheal tube in place. No pneumothorax.   Electronically Signed   By: Lahoma Crocker M.D.   On: 10/17/2014 11:30   Dg Chest Port 1v Same Day  10/17/2014   CLINICAL DATA:  Drug overdose, central line placement  EXAM: PORTABLE CHEST - 1 VIEW SAME DAY  COMPARISON:  10/17/2014  FINDINGS: Interval placement of a right jugular central venous catheter with the tip projecting over the cavoatrial junction.  Endotracheal tube with the tip 3.3 cm above the carina. Nasogastric tube coursing below the diaphragm.  There is no focal parenchymal opacity. There is no pleural effusion or pneumothorax. The heart and mediastinal contours are unremarkable.  The osseous structures  are unremarkable.  IMPRESSION: 1. Right jugular central venous catheter with the tip projecting over the cavoatrial junction. No pneumothorax.   Electronically Signed   By: Kathreen Devoid   On: 10/17/2014 12:49    DISCHARGE EXAMINATION: Filed Vitals:   10/21/14 0540 10/21/14 0902 10/21/14 0905 10/21/14 1027  BP: 141/64   153/63  Pulse: 69   75  Temp: 97.5 F (36.4 C)   98.4 F (36.9 C)  TempSrc: Oral   Oral  Resp: 20   19  Height:      Weight: 94.983 kg (209 lb 6.4 oz)     SpO2: 98% 98% 98% 95%   General appearance: alert, cooperative, appears stated age and no distress Resp: Cold breath sounds bilaterally with a few crackles at the bases. No wheezing. No rhonchi. Cardio: regular rate and rhythm, S1, S2 normal, no murmur, click, rub or gallop GI: soft, non-tender; bowel sounds normal; no masses,  no organomegaly  DISPOSITION: Home with husband  Discharge Instructions    Call MD for:  difficulty breathing, headache or visual disturbances    Complete by:  As directed      Call MD for:  extreme fatigue    Complete by:  As directed      Call MD for:  persistant dizziness or light-headedness    Complete by:  As directed      Call MD for:  persistant nausea and vomiting    Complete by:  As directed      Call MD for:  temperature >100.4    Complete by:  As directed      Diet - low sodium heart healthy    Complete by:  As directed      Discharge instructions    Complete by:  As directed   You will need home oxygen especially with exertion. Please see your doctor before end of week or early next week. Talk to him before resuming the Losartan/HCTZ. You will need blood work as well. Avoid any travel for 1 week.  You were cared for by a hospitalist during your hospital stay. If you have any questions about your discharge medications or the care you received while you were in the hospital after you are discharged, you can call the unit and asked to speak with the hospitalist on call if the  hospitalist that took care of you is not available. Once you  are discharged, your primary care physician will handle any further medical issues. Please note that NO REFILLS for any discharge medications will be authorized once you are discharged, as it is imperative that you return to your primary care physician (or establish a relationship with a primary care physician if you do not have one) for your aftercare needs so that they can reassess your need for medications and monitor your lab values. If you do not have a primary care physician, you can call [phone] for a physician referral.     Increase activity slowly    Complete by:  As directed            ALLERGIES: No Active Allergies   Current Discharge Medication List    START taking these medications   Details  Fluticasone-Salmeterol (ADVAIR DISKUS) 250-50 MCG/DOSE AEPB Inhale 1 puff into the lungs 2 (two) times daily. Qty: 60 each, Refills: 0    magnesium oxide (MAG-OX) 400 MG tablet Take 1 tablet (400 mg total) by mouth 2 (two) times daily. For 10 days Qty: 20 tablet, Refills: 0    predniSONE (DELTASONE) 20 MG tablet Take 3 tablets once daily for 3 days, then take 2 tablets once daily for 3 days, then take 1 tablet once daily for 3 days, then STOP. Qty: 18 tablet, Refills: 0    saccharomyces boulardii (FLORASTOR) 250 MG capsule Take 1 capsule (250 mg total) by mouth 2 (two) times daily. For 10 days Qty: 20 capsule, Refills: 0      CONTINUE these medications which have CHANGED   Details  levofloxacin (LEVAQUIN) 500 MG tablet Take 1 tablet (500 mg total) by mouth daily. For 6 moe days Qty: 6 tablet, Refills: 0    potassium chloride SA (K-DUR,KLOR-CON) 20 MEQ tablet Take 1 tablet (20 mEq total) by mouth daily.      CONTINUE these medications which have NOT CHANGED   Details  acetaminophen (TYLENOL) 650 MG CR tablet Take 1,300 mg by mouth every 8 (eight) hours as needed for pain.    albuterol (PROVENTIL HFA;VENTOLIN HFA)  108 (90 BASE) MCG/ACT inhaler Inhale 1-2 puffs into the lungs every 6 (six) hours as needed for wheezing or shortness of breath. Qty: 1 Inhaler, Refills: 0    amitriptyline (ELAVIL) 50 MG tablet Take 50 mg by mouth at bedtime.    amLODipine (NORVASC) 10 MG tablet Take 10 mg by mouth at bedtime.     aspirin 81 MG tablet Take 81 mg by mouth at bedtime.     atorvastatin (LIPITOR) 40 MG tablet Take 1 tablet (40 mg total) by mouth daily at 6 PM. Qty: 30 tablet, Refills: 6    diazepam (VALIUM) 5 MG tablet Take 5 mg by mouth 2 (two) times daily as needed for anxiety. For anxiety    levothyroxine (SYNTHROID, LEVOTHROID) 75 MCG tablet Take 75 mcg by mouth daily before breakfast.    metoprolol succinate (TOPROL-XL) 50 MG 24 hr tablet TAKE ONE TABLET BY MOUTH ONCE DAILY  WITH OR IMMEDIATELY FOLLOWING A MEAL Qty: 30 tablet, Refills: 6    Omega-3 Fatty Acids (FISH OIL PO) Take 1 capsule by mouth daily.    pantoprazole (PROTONIX) 40 MG tablet Take 1 tablet (40 mg total) by mouth 2 (two) times daily before a meal. Qty: 60 tablet, Refills: 5      STOP taking these medications     HYDROcodone-homatropine (HYCODAN) 5-1.5 MG/5ML syrup      losartan-hydrochlorothiazide (HYZAAR) 100-12.5 MG per tablet  predniSONE (STERAPRED UNI-PAK 21 TAB) 10 MG (21) TBPK tablet        Follow-up Information    Follow up with Delphina Cahill, MD. Schedule an appointment as soon as possible for a visit in 1 week.   Specialty:  Internal Medicine   Why:  post hospitalization follow up. Talk to him before resuming the Losartan/HCTZ. You will need blood work as well.   Contact information:   [address] [place] [address]       TOTAL DISCHARGE TIME: 66 minutes  [facility] Pager [phone]  10/21/2014, 3:24 PM

## 2014-10-21 NOTE — Progress Notes (Signed)
Physical and Occupational Therapy  Note  SATURATION QUALIFICATIONS: (This note is used to comply with regulatory documentation for home oxygen)  Patient Saturations on Room Air at Rest = 87% (sitting EOB while donning socks)  Patient Saturations on Room Air while Ambulating = 83%  Patient Saturations on 2 Liters of oxygen while Ambulating = 96%  Please briefly explain why patient needs home oxygen: Patient requires supplemental oxygen to maintain oxygen saturations at acceptable, safe levels with physical activity.  Of note, her HR max observed was 127 with activity;  Session ended on 1 L supplemental O2; RN and MD aware;   Please see PT and OT Evaluations of this date for more details of session;  Thank you,  Roney Marion, [place] Pager [phone] Office [phone]

## 2014-10-21 NOTE — Discharge Instructions (Signed)

## 2014-10-21 NOTE — Progress Notes (Signed)
Patient Discharge: Disposition:  Patient discharged to home with home oxygen.  Education: Educated patient and her husband about medications, prescriptions, follow-up appointment, and discharge instructions.  Given handout on Acute bronchitis easy to read. IV: Central line removed by IV team, site looks clean and dry. Telemetry: Discontinued and CCMD notified. Transportation: Patient transported in w/c with staff and husband accompanying and also with oxygen cylinder. Belongings: Patient took all her belongings with her.

## 2014-10-21 NOTE — Care Management Note (Signed)
Case Management Note  Patient Details  Name: [name] MRN: [id number] Date of Birth: 1946-03-14  Subjective/Objective:              CM following for progression and d/c planning.      Action/Plan: 10/21/2014 Met with pt who states that she will be d/c to home with husband and then they plan to travel to [facility], this CM left message with pt husband . Will speak to him re [facility] needs currently pt is declining [place] services and is she truly is traveling on vacation then [facility] is not appropriate. [place] notified of oxygen orders and plan to d/c.  Expected Discharge Date:       10/21/2014           Expected Discharge Plan:  [employer]  In-House Referral:  Clinical Social Work, NA  Discharge planning Services  NA  Post Acute Care Choice:  NA Choice offered to:  NA  DME Arranged:  Oxygen DME Agency:  [place]:    [facility] Agency:   Pt declined [place] services.   Status of Service:  Complete  Medicare Important Message Given:  Yes Date Medicare IM Given:  10/21/14 Medicare IM give by:  Jasmine Pang RN MPH, case manager, [phone] Date Additional Medicare IM Given:    Additional Medicare Important Message give by:     If discussed at [place] of Stay Meetings, dates discussed:    Additional Comments:  Adron Bene, RN 10/21/2014, 1:04 PM

## 2014-10-21 NOTE — Evaluation (Signed)
Occupational Therapy Evaluation Patient Details Name: [name] MRN: [id number] DOB: [birth year] Today's Date: 10/21/2014    History of Present Illness 69yo female with hx HTN, GERD, anxiety who presented 5/19 to [facility] with drug OD. Per husband she took approx half a bottle of codeine cough syrup. Was unresponsive, in respiratory distress, intubated in ER. Found to have acute renal failure and tx to [facility].    Clinical Impression  Pt with decline in function and safety with ADLs and ADL mobility with decreased strength, balance and endurance. Pt with hx of blindness and has sup at home for spouse and son. Pt with decreased O2 SATs and increased heart rate with activity (see VS notes below). Pt would benefit from acute OT services to address impairments to increase level of functiona and safety. Feel that pt's unsteadiness due to decreased activity from hospital stay and being in an unfamiliar environment.     Follow Up Recommendations  No OT follow up    Equipment Recommendations  None recommended by OT    Recommendations for Other Services       Precautions / Restrictions Precautions Precautions: Fall Restrictions Weight Bearing Restrictions: No      Mobility Bed Mobility Overal bed mobility: Modified Independent                Transfers Overall transfer level: Needs assistance Equipment used: 1 person hand held assist;Rolling walker (2 wheeled) Transfers: Sit to/from Stand Sit to Stand: Min guard         General transfer comment: pt blind and requires support for balance/safety. Pt unsteady and also trialed with RW. Verbal and physical cues for directions/guiding    Balance Overall balance assessment: Needs assistance Sitting-balance support: No upper extremity supported Sitting balance-Leahy Scale: Good     Standing balance support: Single extremity supported;During functional activity Standing balance-Leahy Scale: Fair                               ADL Overall ADL's : Needs assistance/impaired     Grooming: Wash/dry hands;Wash/dry face;Standing;Min guard   Upper Body Bathing: Set up;Sitting   Lower Body Bathing: Min guard;Sit to/from stand   Upper Body Dressing : Set up;Sitting   Lower Body Dressing: Sit to/from stand;Min guard   Toilet Transfer: Min guard;Regular Toilet;Grab bars;Ambulation Toilet Transfer Details (indicate cue type and reason): verbal and physical cues for directions/guiding Toileting- Clothing Manipulation and Hygiene: Min guard   Tub/ Shower Transfer: Grab bars;Walk-in Lobbyist Details (indicate cue type and reason): verbal and physical cues for directions/guiding Functional mobility during ADLs: Min guard       Vision  pt blind, no change form baseline   Perception Perception Perception Tested?: No   Praxis Praxis Praxis tested?: Not tested    Pertinent Vitals/Pain Pain Assessment: No/denies pain   SATURATION QUALIFICATIONS:   Patient Saturations on Room Air at Rest = 87% (sitting EOB while donning socks)  Patient Saturations on Room Air while Ambulating = 83%  Patient Saturations on 2 Liters of oxygen while Ambulating = 96%  Please briefly explain why patient needs home oxygen: Patient requires supplemental oxygen to maintain oxygen saturations at acceptable, safe levels with physical activity.  Of note, her HR max observed was 127 with activity;  Session ended on 1 L supplemental O2; RN and MD aware;            Hand Dominance Right   Extremity/Trunk  Assessment Upper Extremity Assessment Upper Extremity Assessment: Overall WFL for tasks assessed   Lower Extremity Assessment Lower Extremity Assessment: Defer to PT evaluation   Cervical / Trunk Assessment Cervical / Trunk Assessment: Normal   Communication Communication Communication: No difficulties   Cognition Arousal/Alertness: Awake/alert Behavior During Therapy: WFL for  tasks assessed/performed Overall Cognitive Status: Within Functional Limits for tasks assessed                     General Comments       Exercises       Shoulder Instructions      Home Living Family/patient expects to be discharged to:: Private residence Living Arrangements: Spouse/significant other Available Help at Discharge: Family Type of Home: House Home Access: Stairs to enter Technical brewer of Steps: 3 Entrance Stairs-Rails: Right;Left;Can reach both Home Layout: One level     Bathroom Shower/Tub: Tub/shower unit;Walk-in shower   Bathroom Toilet: Standard     Home Equipment: Cane - single point;Walker - 2 wheels          Prior Functioning/Environment Level of Independence: Independent             OT Diagnosis: Generalized weakness   OT Problem List: Decreased activity tolerance;Impaired balance (sitting and/or standing);Decreased strength;Cardiopulmonary status limiting activity   OT Treatment/Interventions: Self-care/ADL training;Patient/family education;Therapeutic activities    OT Goals(Current goals can be found in the care plan section) Acute Rehab OT Goals Patient Stated Goal: go home and go to [facility] OT Goal Formulation: With patient Time For Goal Achievement: 10/28/14 Potential to Achieve Goals: Good ADL Goals Pt Will Perform Grooming: with set-up;with supervision;with modified independence;standing Pt Will Perform Lower Body Bathing: with modified independence;with set-up;with supervision;sit to/from stand Pt Will Perform Lower Body Dressing: with set-up;with supervision;with modified independence;sit to/from stand Pt Will Transfer to Toilet: with modified independence;with supervision;ambulating;regular height toilet;grab bars Pt Will Perform Toileting - Clothing Manipulation and hygiene: with supervision;with modified independence;sit to/from stand Pt Will Perform Tub/Shower Transfer: Shower transfer;with  supervision;ambulating;grab bars  OT Frequency: Min 2X/week   Barriers to D/C:    none       Co-evaluation PT/OT/SLP Co-Evaluation/Treatment: Yes Reason for Co-Treatment: For patient/therapist safety   OT goals addressed during session: ADL's and self-care      End of Session Equipment Utilized During Treatment: Oxygen;Rolling walker (O2 SATs and HR monitered) Nurse Communication: Mobility status  Activity Tolerance: Patient tolerated treatment well Patient left: in chair;with call bell/phone within reach;with nursing/sitter in room   Time: 0938-1020 OT Time Calculation (min): 42 min Charges:  OT General Charges $OT Visit: 1 Procedure OT Evaluation $Initial OT Evaluation Tier I: 1 Procedure OT Treatments $Therapeutic Activity: 8-22 mins G-Codes:    Britt Bottom 10/21/2014, 1:13 PM

## 2014-10-22 LAB — CULTURE, BLOOD (ROUTINE X 2)
CULTURE: NO GROWTH
Culture: NO GROWTH

## 2014-10-30 DIAGNOSIS — J9600 Acute respiratory failure, unspecified whether with hypoxia or hypercapnia: Secondary | ICD-10-CM | POA: Diagnosis not present

## 2014-11-11 DIAGNOSIS — G4700 Insomnia, unspecified: Secondary | ICD-10-CM | POA: Diagnosis not present

## 2014-11-15 ENCOUNTER — Ambulatory Visit (HOSPITAL_COMMUNITY)
Admission: RE | Admit: 2014-11-15 | Discharge: 2014-11-15 | Disposition: A | Discharge status: Home / Self Care | Payer: Medicare Other | Source: Ambulatory Visit | Attending: Internal Medicine | Admitting: Internal Medicine

## 2014-11-15 ENCOUNTER — Other Ambulatory Visit (HOSPITAL_COMMUNITY): Payer: Self-pay | Admitting: Internal Medicine

## 2014-11-15 DIAGNOSIS — R4182 Altered mental status, unspecified: Secondary | ICD-10-CM | POA: Diagnosis not present

## 2014-11-15 DIAGNOSIS — R410 Disorientation, unspecified: Secondary | ICD-10-CM

## 2014-11-15 DIAGNOSIS — H6501 Acute serous otitis media, right ear: Secondary | ICD-10-CM | POA: Diagnosis not present

## 2014-11-15 DIAGNOSIS — T391X1A Poisoning by 4-Aminophenol derivatives, accidental (unintentional), initial encounter: Secondary | ICD-10-CM | POA: Diagnosis not present

## 2014-11-19 ENCOUNTER — Other Ambulatory Visit: Payer: Self-pay | Admitting: *Deleted

## 2014-11-19 MED ORDER — ATORVASTATIN CALCIUM 40 MG PO TABS: 40.0000 mg | ORAL_TABLET | Freq: Every day | ORAL | Status: DC

## 2014-11-19 NOTE — Telephone Encounter (Signed)
Refill of Atorvastatin complete

## 2015-01-01 DIAGNOSIS — M545 Low back pain: Secondary | ICD-10-CM | POA: Diagnosis not present

## 2015-01-23 ENCOUNTER — Other Ambulatory Visit: Payer: Self-pay | Admitting: Gastroenterology

## 2015-02-18 DIAGNOSIS — Z23 Encounter for immunization: Secondary | ICD-10-CM | POA: Diagnosis not present

## 2015-03-25 ENCOUNTER — Other Ambulatory Visit: Payer: Self-pay | Admitting: Cardiology

## 2015-04-09 DIAGNOSIS — I10 Essential (primary) hypertension: Secondary | ICD-10-CM | POA: Diagnosis not present

## 2015-04-09 DIAGNOSIS — E559 Vitamin D deficiency, unspecified: Secondary | ICD-10-CM | POA: Diagnosis not present

## 2015-04-09 DIAGNOSIS — E1142 Type 2 diabetes mellitus with diabetic polyneuropathy: Secondary | ICD-10-CM | POA: Diagnosis not present

## 2015-04-09 DIAGNOSIS — R110 Nausea: Secondary | ICD-10-CM | POA: Diagnosis not present

## 2015-04-09 DIAGNOSIS — E039 Hypothyroidism, unspecified: Secondary | ICD-10-CM | POA: Diagnosis not present

## 2015-04-09 DIAGNOSIS — E785 Hyperlipidemia, unspecified: Secondary | ICD-10-CM | POA: Diagnosis not present

## 2015-04-09 DIAGNOSIS — G629 Polyneuropathy, unspecified: Secondary | ICD-10-CM | POA: Diagnosis not present

## 2015-04-09 DIAGNOSIS — E119 Type 2 diabetes mellitus without complications: Secondary | ICD-10-CM | POA: Diagnosis not present

## 2015-04-16 DIAGNOSIS — E782 Mixed hyperlipidemia: Secondary | ICD-10-CM | POA: Diagnosis not present

## 2015-04-16 DIAGNOSIS — E039 Hypothyroidism, unspecified: Secondary | ICD-10-CM | POA: Diagnosis not present

## 2015-04-16 DIAGNOSIS — I10 Essential (primary) hypertension: Secondary | ICD-10-CM | POA: Diagnosis not present

## 2015-04-16 DIAGNOSIS — R945 Abnormal results of liver function studies: Secondary | ICD-10-CM | POA: Diagnosis not present

## 2015-04-16 DIAGNOSIS — M069 Rheumatoid arthritis, unspecified: Secondary | ICD-10-CM | POA: Diagnosis not present

## 2015-05-06 ENCOUNTER — Other Ambulatory Visit: Payer: Self-pay | Admitting: Gastroenterology

## 2015-09-04 ENCOUNTER — Encounter: Payer: Medicare Other | Admitting: Cardiology
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
## 2015-09-10 ENCOUNTER — Other Ambulatory Visit: Payer: Self-pay | Admitting: Gastroenterology

## 2015-09-10 ENCOUNTER — Other Ambulatory Visit: Payer: Self-pay | Admitting: Cardiology

## 2015-10-17 DIAGNOSIS — E782 Mixed hyperlipidemia: Secondary | ICD-10-CM | POA: Diagnosis not present

## 2015-10-17 DIAGNOSIS — E039 Hypothyroidism, unspecified: Secondary | ICD-10-CM | POA: Diagnosis not present

## 2015-10-17 DIAGNOSIS — E1142 Type 2 diabetes mellitus with diabetic polyneuropathy: Secondary | ICD-10-CM | POA: Diagnosis not present

## 2015-10-21 DIAGNOSIS — E782 Mixed hyperlipidemia: Secondary | ICD-10-CM | POA: Diagnosis not present

## 2015-10-21 DIAGNOSIS — I10 Essential (primary) hypertension: Secondary | ICD-10-CM | POA: Diagnosis not present

## 2015-10-21 DIAGNOSIS — E039 Hypothyroidism, unspecified: Secondary | ICD-10-CM | POA: Diagnosis not present

## 2015-10-21 DIAGNOSIS — E119 Type 2 diabetes mellitus without complications: Secondary | ICD-10-CM | POA: Diagnosis not present

## 2015-10-28 ENCOUNTER — Telehealth: Payer: Self-pay | Admitting: Cardiology

## 2015-10-28 MED ORDER — METOPROLOL SUCCINATE ER 50 MG PO TB24: ORAL_TABLET | ORAL | Status: DC

## 2015-10-28 NOTE — Telephone Encounter (Signed)
°  1. Which medications need to be refilled? (please list name of each medication and dose if known) metoprolol succinate (TOPROL-XL) 50 MG 24 hr tablet [id number]  2. Which pharmacy/location (including street and city if local pharmacy) is medication to be sent to? [place]    3. Do they need a 30 day or 90 day supply?

## 2015-11-07 ENCOUNTER — Other Ambulatory Visit: Payer: Self-pay

## 2015-11-07 MED ORDER — ATORVASTATIN CALCIUM 40 MG PO TABS: 40.0000 mg | ORAL_TABLET | Freq: Every day | ORAL | Status: DC

## 2015-11-07 NOTE — Telephone Encounter (Signed)
Refill complete 

## 2015-11-19 ENCOUNTER — Encounter: Payer: Self-pay | Admitting: Cardiology

## 2015-11-19 ENCOUNTER — Ambulatory Visit (INDEPENDENT_AMBULATORY_CARE_PROVIDER_SITE_OTHER): Payer: Medicare Other | Admitting: Cardiology

## 2015-11-19 VITALS — BP 118/68 | HR 68 | Wt 203.0 lb

## 2015-11-19 DIAGNOSIS — R0602 Shortness of breath: Secondary | ICD-10-CM

## 2015-11-19 DIAGNOSIS — I2510 Atherosclerotic heart disease of native coronary artery without angina pectoris: Secondary | ICD-10-CM

## 2015-11-19 DIAGNOSIS — E785 Hyperlipidemia, unspecified: Secondary | ICD-10-CM

## 2015-11-19 DIAGNOSIS — I10 Essential (primary) hypertension: Secondary | ICD-10-CM
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
## 2015-11-19 NOTE — Patient Instructions (Signed)
Your physician wants you to follow-up in: 1 year You will receive a reminder letter in the mail two months in advance. If you don't receive a letter, please call our office to schedule the follow-up appointment.   Your physician recommends that you continue on your current medications as directed. Please refer to the Current Medication list given to you today.    You have been referred to pulmonary function tests      Thank you for choosing [facility] !

## 2015-11-27 ENCOUNTER — Other Ambulatory Visit: Payer: Self-pay | Admitting: Cardiology

## 2015-11-28 ENCOUNTER — Encounter (HOSPITAL_COMMUNITY): Payer: Medicare Other

## 2015-12-10 ENCOUNTER — Ambulatory Visit (HOSPITAL_COMMUNITY)
Admission: RE | Admit: 2015-12-10 | Discharge: 2015-12-10 | Disposition: A | Discharge status: Home / Self Care | Payer: Medicare Other | Source: Ambulatory Visit | Attending: Cardiology | Admitting: Cardiology

## 2015-12-10 DIAGNOSIS — R0602 Shortness of breath: Secondary | ICD-10-CM

## 2015-12-10 LAB — PULMONARY FUNCTION TEST
DL/VA % PRED: 66 %
DL/VA: 2.94 ml/min/mmHg/L
DLCO unc % pred: 43 %
DLCO unc: 8.73 ml/min/mmHg
FEF 25-75 POST: 1.1 L/s
FEF 25-75 Pre: 1.01 L/sec
FEF2575-%CHANGE-POST: 9 %
FEF2575-%PRED-POST: 60 %
FEF2575-%Pred-Pre: 55 %
FEV1-%CHANGE-POST: 1 %
FEV1-%Pred-Post: 73 %
FEV1-%Pred-Pre: 72 %
FEV1-PRE: 1.49 L
FEV1-Post: 1.51 L
FEV1FVC-%Change-Post: 4 %
FEV1FVC-%PRED-PRE: 97 %
FEV6-%Change-Post: -3 %
FEV6-%Pred-Post: 75 %
FEV6-%Pred-Pre: 77 %
FEV6-POST: 1.94 L
FEV6-Pre: 2 L
FEV6FVC-%Change-Post: 0 %
FEV6FVC-%Pred-Post: 103 %
FEV6FVC-%Pred-Pre: 103 %
FVC-%Change-Post: -3 %
FVC-%PRED-POST: 72 %
FVC-%Pred-Pre: 74 %
FVC-POST: 1.95 L
FVC-Pre: 2.01 L
POST FEV1/FVC RATIO: 77 %
PRE FEV6/FVC RATIO: 100 %
Post FEV6/FVC ratio: 99 %
Pre FEV1/FVC ratio: 74 %
RV % pred: 88 %
RV: 1.77 L
TLC % pred: 81 %
TLC: 3.74 L

## 2015-12-10 MED ORDER — ALBUTEROL SULFATE (2.5 MG/3ML) 0.083% IN NEBU
2.5000 mg | INHALATION_SOLUTION | Freq: Once | RESPIRATORY_TRACT | Status: AC
  Administered 2015-12-10: 2.5 mg via RESPIRATORY_TRACT

## 2015-12-16 ENCOUNTER — Telehealth: Payer: Self-pay | Admitting: Cardiology

## 2015-12-16 NOTE — Telephone Encounter (Signed)
Pt requested that we sent PFT results to Dr. Juel Burrow office. Routed results as requested

## 2015-12-16 NOTE — Telephone Encounter (Signed)
Pt's husband is calling concerning her breathing medications that were supposed to be sent to Dr. Juel Burrow office?

## 2015-12-23 ENCOUNTER — Telehealth: Payer: Self-pay | Admitting: Cardiology

## 2015-12-23 NOTE — Telephone Encounter (Signed)
Patient has completed PFT and would like to know who will be ordering her inhalers. Dr.Hall's office called to verify that they received the PFT results and would f/u with pt. Patient notified.

## 2016-01-14 DIAGNOSIS — I25119 Atherosclerotic heart disease of native coronary artery with unspecified angina pectoris: Secondary | ICD-10-CM | POA: Diagnosis not present

## 2016-01-14 DIAGNOSIS — J441 Chronic obstructive pulmonary disease with (acute) exacerbation: Secondary | ICD-10-CM | POA: Diagnosis not present

## 2016-01-14 DIAGNOSIS — E1142 Type 2 diabetes mellitus with diabetic polyneuropathy: Secondary | ICD-10-CM | POA: Diagnosis not present

## 2016-01-14 DIAGNOSIS — R05 Cough: Secondary | ICD-10-CM | POA: Diagnosis not present

## 2016-02-12 NOTE — Progress Notes (Signed)
This encounter was created in error - please disregard.

## 2016-02-20 DIAGNOSIS — E1142 Type 2 diabetes mellitus with diabetic polyneuropathy: Secondary | ICD-10-CM | POA: Diagnosis not present

## 2016-02-20 DIAGNOSIS — J441 Chronic obstructive pulmonary disease with (acute) exacerbation: Secondary | ICD-10-CM | POA: Diagnosis not present

## 2016-02-20 DIAGNOSIS — I25119 Atherosclerotic heart disease of native coronary artery with unspecified angina pectoris: Secondary | ICD-10-CM | POA: Diagnosis not present

## 2016-02-20 DIAGNOSIS — R05 Cough: Secondary | ICD-10-CM | POA: Diagnosis not present

## 2016-02-20 DIAGNOSIS — Z23 Encounter for immunization: Secondary | ICD-10-CM | POA: Diagnosis not present

## 2016-02-25 DIAGNOSIS — Z23 Encounter for immunization: Secondary | ICD-10-CM | POA: Diagnosis not present

## 2016-03-15 ENCOUNTER — Other Ambulatory Visit: Payer: Self-pay | Admitting: Gastroenterology

## 2016-04-24 ENCOUNTER — Encounter (HOSPITAL_COMMUNITY): Payer: Self-pay | Admitting: Emergency Medicine

## 2016-04-24 ENCOUNTER — Observation Stay (HOSPITAL_COMMUNITY)
Admission: EM | Admit: 2016-04-24 | Discharge: 2016-04-25 | Disposition: A | Discharge status: Home / Self Care | Payer: Medicare Other | Attending: Family Medicine | Admitting: Family Medicine

## 2016-04-24 ENCOUNTER — Emergency Department (HOSPITAL_COMMUNITY): Payer: Medicare Other

## 2016-04-24 DIAGNOSIS — R0602 Shortness of breath: Secondary | ICD-10-CM | POA: Diagnosis not present

## 2016-04-24 DIAGNOSIS — I700 Atherosclerosis of aorta: Secondary | ICD-10-CM | POA: Diagnosis not present

## 2016-04-24 DIAGNOSIS — Z87891 Personal history of nicotine dependence: Secondary | ICD-10-CM | POA: Insufficient documentation

## 2016-04-24 DIAGNOSIS — I10 Essential (primary) hypertension: Secondary | ICD-10-CM | POA: Insufficient documentation

## 2016-04-24 DIAGNOSIS — Z7984 Long term (current) use of oral hypoglycemic drugs: Secondary | ICD-10-CM | POA: Diagnosis not present

## 2016-04-24 DIAGNOSIS — J441 Chronic obstructive pulmonary disease with (acute) exacerbation: Secondary | ICD-10-CM | POA: Diagnosis not present

## 2016-04-24 DIAGNOSIS — J9600 Acute respiratory failure, unspecified whether with hypoxia or hypercapnia: Secondary | ICD-10-CM | POA: Diagnosis present

## 2016-04-24 DIAGNOSIS — R05 Cough: Secondary | ICD-10-CM | POA: Diagnosis not present

## 2016-04-24 DIAGNOSIS — R112 Nausea with vomiting, unspecified: Secondary | ICD-10-CM | POA: Diagnosis not present

## 2016-04-24 DIAGNOSIS — E119 Type 2 diabetes mellitus without complications: Secondary | ICD-10-CM | POA: Insufficient documentation

## 2016-04-24 DIAGNOSIS — Z79899 Other long term (current) drug therapy: Secondary | ICD-10-CM | POA: Insufficient documentation

## 2016-04-24 DIAGNOSIS — H548 Legal blindness, as defined in USA: Secondary | ICD-10-CM

## 2016-04-24 DIAGNOSIS — J9601 Acute respiratory failure with hypoxia: Secondary | ICD-10-CM | POA: Diagnosis present

## 2016-04-24 DIAGNOSIS — Z7982 Long term (current) use of aspirin: Secondary | ICD-10-CM | POA: Insufficient documentation

## 2016-04-24 DIAGNOSIS — F17201 Nicotine dependence, unspecified, in remission: Secondary | ICD-10-CM

## 2016-04-24 DIAGNOSIS — I2510 Atherosclerotic heart disease of native coronary artery without angina pectoris: Secondary | ICD-10-CM | POA: Insufficient documentation

## 2016-04-24 DIAGNOSIS — J45909 Unspecified asthma, uncomplicated: Secondary | ICD-10-CM | POA: Insufficient documentation

## 2016-04-24 DIAGNOSIS — E039 Hypothyroidism, unspecified: Secondary | ICD-10-CM | POA: Insufficient documentation

## 2016-04-24 HISTORY — DX: Irritable bowel syndrome, unspecified: K58.9

## 2016-04-24 HISTORY — DX: Chronic obstructive pulmonary disease, unspecified: J44.9

## 2016-04-24 HISTORY — DX: Atherosclerosis of aorta: I70.0

## 2016-04-24 LAB — GLUCOSE, CAPILLARY
GLUCOSE-CAPILLARY: 216 mg/dL — AB (ref 65–99)
Glucose-Capillary: 286 mg/dL — ABNORMAL HIGH (ref 65–99)

## 2016-04-24 LAB — COMPREHENSIVE METABOLIC PANEL
ALBUMIN: 3 g/dL — AB (ref 3.5–5.0)
ALT: 54 U/L (ref 14–54)
AST: 82 U/L — AB (ref 15–41)
Alkaline Phosphatase: 73 U/L (ref 38–126)
Anion gap: 8 (ref 5–15)
BUN: 16 mg/dL (ref 6–20)
CHLORIDE: 100 mmol/L — AB (ref 101–111)
CO2: 24 mmol/L (ref 22–32)
Calcium: 8.6 mg/dL — ABNORMAL LOW (ref 8.9–10.3)
Creatinine, Ser: 1.18 mg/dL — ABNORMAL HIGH (ref 0.44–1.00)
GFR calc Af Amer: 54 mL/min — ABNORMAL LOW (ref >60)
GFR, EST NON AFRICAN AMERICAN: 46 mL/min — AB (ref >60)
Glucose, Bld: 93 mg/dL (ref 65–99)
POTASSIUM: 3.3 mmol/L — AB (ref 3.5–5.1)
SODIUM: 132 mmol/L — AB (ref 135–145)
Total Bilirubin: 0.6 mg/dL (ref 0.3–1.2)
Total Protein: 9.7 g/dL — ABNORMAL HIGH (ref 6.5–8.1)

## 2016-04-24 LAB — URINE MICROSCOPIC-ADD ON

## 2016-04-24 LAB — LIPASE, BLOOD: LIPASE: 26 U/L (ref 11–51)

## 2016-04-24 LAB — CBC WITH DIFFERENTIAL/PLATELET
BASOS ABS: 0 10*3/uL (ref 0.0–0.1)
BASOS PCT: 0 %
EOS PCT: 1 %
Eosinophils Absolute: 0.1 10*3/uL (ref 0.0–0.7)
HCT: 37.5 % (ref 36.0–46.0)
Hemoglobin: 12.5 g/dL (ref 12.0–15.0)
Lymphocytes Relative: 25 %
Lymphs Abs: 1.9 10*3/uL (ref 0.7–4.0)
MCH: 30 pg (ref 26.0–34.0)
MCHC: 33.3 g/dL (ref 30.0–36.0)
MCV: 90.1 fL (ref 78.0–100.0)
MONO ABS: 1 10*3/uL (ref 0.1–1.0)
Monocytes Relative: 14 %
NEUTROS ABS: 4.4 10*3/uL (ref 1.7–7.7)
Neutrophils Relative %: 60 %
PLATELETS: 214 10*3/uL (ref 150–400)
RBC: 4.16 MIL/uL (ref 3.87–5.11)
RDW: 16.6 % — AB (ref 11.5–15.5)
WBC: 7.4 10*3/uL (ref 4.0–10.5)

## 2016-04-24 LAB — URINALYSIS, ROUTINE W REFLEX MICROSCOPIC
Glucose, UA: NEGATIVE mg/dL
Hgb urine dipstick: NEGATIVE
NITRITE: NEGATIVE
PROTEIN: NEGATIVE mg/dL
Specific Gravity, Urine: 1.025 (ref 1.005–1.030)
pH: 5.5 (ref 5.0–8.0)

## 2016-04-24 LAB — BRAIN NATRIURETIC PEPTIDE: B NATRIURETIC PEPTIDE 5: 99 pg/mL (ref 0.0–100.0)

## 2016-04-24 LAB — LACTIC ACID, PLASMA
LACTIC ACID, VENOUS: 2 mmol/L — AB (ref 0.5–1.9)
Lactic Acid, Venous: 1.3 mmol/L (ref 0.5–1.9)

## 2016-04-24 LAB — INFLUENZA PANEL BY PCR (TYPE A & B)
INFLBPCR: NEGATIVE
Influenza A By PCR: NEGATIVE

## 2016-04-24 LAB — TROPONIN I: Troponin I: <0.03 ng/mL (ref <0.03)

## 2016-04-24 MED ORDER — IPRATROPIUM BROMIDE 0.02 % IN SOLN
1.0000 mg | Freq: Once | RESPIRATORY_TRACT | Status: AC
  Administered 2016-04-24: 1 mg via RESPIRATORY_TRACT
  Filled 2016-04-24: qty 5

## 2016-04-24 MED ORDER — ACETAMINOPHEN 325 MG PO TABS: 650.0000 mg | ORAL_TABLET | Freq: Four times a day (QID) | ORAL | Status: DC | PRN

## 2016-04-24 MED ORDER — POTASSIUM CHLORIDE CRYS ER 20 MEQ PO TBCR
20.0000 meq | EXTENDED_RELEASE_TABLET | Freq: Every day | ORAL | Status: DC
  Administered 2016-04-24 – 2016-04-25 (×2): 20 meq via ORAL
  Filled 2016-04-24 (×2): qty 1

## 2016-04-24 MED ORDER — SODIUM CHLORIDE 0.9 % IV SOLN: INTRAVENOUS | Status: DC

## 2016-04-24 MED ORDER — DIAZEPAM 5 MG PO TABS: 5.0000 mg | ORAL_TABLET | Freq: Two times a day (BID) | ORAL | Status: DC | PRN

## 2016-04-24 MED ORDER — METOPROLOL SUCCINATE ER 50 MG PO TB24
50.0000 mg | ORAL_TABLET | Freq: Every day | ORAL | Status: DC
  Administered 2016-04-24 – 2016-04-25 (×2): 50 mg via ORAL
  Filled 2016-04-24 (×2): qty 1

## 2016-04-24 MED ORDER — DOXYCYCLINE HYCLATE 100 MG PO TABS
100.0000 mg | ORAL_TABLET | Freq: Two times a day (BID) | ORAL | Status: DC
  Administered 2016-04-24 – 2016-04-25 (×3): 100 mg via ORAL
  Filled 2016-04-24 (×3): qty 1

## 2016-04-24 MED ORDER — BENZONATATE 100 MG PO CAPS
200.0000 mg | ORAL_CAPSULE | Freq: Three times a day (TID) | ORAL | Status: DC
  Administered 2016-04-24 – 2016-04-25 (×2): 200 mg via ORAL
  Filled 2016-04-24 (×2): qty 2

## 2016-04-24 MED ORDER — ATORVASTATIN CALCIUM 40 MG PO TABS
40.0000 mg | ORAL_TABLET | Freq: Every day | ORAL | Status: DC
  Administered 2016-04-24: 40 mg via ORAL
  Filled 2016-04-24: qty 1

## 2016-04-24 MED ORDER — INSULIN ASPART 100 UNIT/ML ~~LOC~~ SOLN
0.0000 [IU] | Freq: Every day | SUBCUTANEOUS | Status: DC
  Administered 2016-04-24: 3 [IU] via SUBCUTANEOUS

## 2016-04-24 MED ORDER — ACETAMINOPHEN 650 MG RE SUPP: 650.0000 mg | Freq: Four times a day (QID) | RECTAL | Status: DC | PRN

## 2016-04-24 MED ORDER — ONDANSETRON HCL 4 MG/2ML IJ SOLN: 4.0000 mg | Freq: Four times a day (QID) | INTRAMUSCULAR | Status: DC | PRN

## 2016-04-24 MED ORDER — SODIUM CHLORIDE 0.9% FLUSH
3.0000 mL | Freq: Two times a day (BID) | INTRAVENOUS | Status: DC
  Administered 2016-04-24 – 2016-04-25 (×3): 3 mL via INTRAVENOUS

## 2016-04-24 MED ORDER — LEVOTHYROXINE SODIUM 100 MCG PO TABS
100.0000 ug | ORAL_TABLET | Freq: Every day | ORAL | Status: DC
  Administered 2016-04-25: 100 ug via ORAL
  Filled 2016-04-24: qty 1

## 2016-04-24 MED ORDER — METHYLPREDNISOLONE SODIUM SUCC 125 MG IJ SOLR
125.0000 mg | Freq: Once | INTRAMUSCULAR | Status: AC
  Administered 2016-04-24: 125 mg via INTRAVENOUS
  Filled 2016-04-24: qty 2

## 2016-04-24 MED ORDER — MAGNESIUM OXIDE 400 (241.3 MG) MG PO TABS
400.0000 mg | ORAL_TABLET | Freq: Two times a day (BID) | ORAL | Status: DC
  Administered 2016-04-24 – 2016-04-25 (×3): 400 mg via ORAL
  Filled 2016-04-24 (×3): qty 1

## 2016-04-24 MED ORDER — ASPIRIN 81 MG PO CHEW
81.0000 mg | CHEWABLE_TABLET | Freq: Every day | ORAL | Status: DC
  Administered 2016-04-24: 81 mg via ORAL
  Filled 2016-04-24: qty 1

## 2016-04-24 MED ORDER — AMITRIPTYLINE HCL 25 MG PO TABS
50.0000 mg | ORAL_TABLET | Freq: Every day | ORAL | Status: DC
  Administered 2016-04-24: 50 mg via ORAL
  Filled 2016-04-24: qty 2

## 2016-04-24 MED ORDER — AMLODIPINE BESYLATE 5 MG PO TABS
10.0000 mg | ORAL_TABLET | Freq: Every day | ORAL | Status: DC
  Administered 2016-04-24: 10 mg via ORAL
  Filled 2016-04-24: qty 2

## 2016-04-24 MED ORDER — ALBUTEROL SULFATE (2.5 MG/3ML) 0.083% IN NEBU: 2.5000 mg | INHALATION_SOLUTION | RESPIRATORY_TRACT | Status: DC | PRN

## 2016-04-24 MED ORDER — ALBUTEROL (5 MG/ML) CONTINUOUS INHALATION SOLN
10.0000 mg/h | INHALATION_SOLUTION | Freq: Once | RESPIRATORY_TRACT | Status: AC
  Administered 2016-04-24: 10 mg/h via RESPIRATORY_TRACT
  Filled 2016-04-24: qty 20

## 2016-04-24 MED ORDER — PANTOPRAZOLE SODIUM 40 MG PO TBEC
40.0000 mg | DELAYED_RELEASE_TABLET | Freq: Every day | ORAL | Status: DC
  Administered 2016-04-24 – 2016-04-25 (×2): 40 mg via ORAL
  Filled 2016-04-24 (×2): qty 1

## 2016-04-24 MED ORDER — SODIUM CHLORIDE 0.9 % IV BOLUS (SEPSIS)
500.0000 mL | Freq: Once | INTRAVENOUS | Status: AC
  Administered 2016-04-24: 500 mL via INTRAVENOUS

## 2016-04-24 MED ORDER — SODIUM CHLORIDE 0.9 % IV SOLN: 250.0000 mL | INTRAVENOUS | Status: DC | PRN

## 2016-04-24 MED ORDER — GLIPIZIDE ER 5 MG PO TB24
5.0000 mg | ORAL_TABLET | Freq: Every day | ORAL | Status: DC
  Administered 2016-04-25: 5 mg via ORAL
  Filled 2016-04-24: qty 1

## 2016-04-24 MED ORDER — METHYLPREDNISOLONE SODIUM SUCC 40 MG IJ SOLR
40.0000 mg | Freq: Two times a day (BID) | INTRAMUSCULAR | Status: DC
  Administered 2016-04-24 – 2016-04-25 (×2): 40 mg via INTRAVENOUS
  Filled 2016-04-24 (×2): qty 1

## 2016-04-24 MED ORDER — INSULIN ASPART 100 UNIT/ML ~~LOC~~ SOLN
0.0000 [IU] | Freq: Three times a day (TID) | SUBCUTANEOUS | Status: DC
  Administered 2016-04-24 – 2016-04-25 (×2): 3 [IU] via SUBCUTANEOUS
  Administered 2016-04-25: 2 [IU] via SUBCUTANEOUS

## 2016-04-24 MED ORDER — ONDANSETRON HCL 4 MG PO TABS: 4.0000 mg | ORAL_TABLET | Freq: Four times a day (QID) | ORAL | Status: DC | PRN

## 2016-04-24 MED ORDER — GUAIFENESIN-DM 100-10 MG/5ML PO SYRP
5.0000 mL | ORAL_SOLUTION | ORAL | Status: DC | PRN
  Administered 2016-04-24 – 2016-04-25 (×3): 5 mL via ORAL
  Filled 2016-04-24 (×3): qty 5

## 2016-04-24 MED ORDER — SODIUM CHLORIDE 0.9% FLUSH: 3.0000 mL | INTRAVENOUS | Status: DC | PRN

## 2016-04-24 NOTE — ED Notes (Signed)
Nurse unavailable for report.  

## 2016-04-24 NOTE — ED Triage Notes (Signed)
Pt reports productive cough x3 days, n/v since yesterday.  Pt only has abd pain when coughing.  Pt denies diarrhea/urinary symptoms.
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
## 2016-04-24 NOTE — ED Notes (Signed)
Pt was 88% on RA. Pt placed on 2L with increase in oxygen to 96%

## 2016-04-24 NOTE — Progress Notes (Signed)
Patient lactic acid 2.0 Dr. Sarajane Jews notified. Patient with no diet order. Dr. Sarajane Jews notified. New order for carb modified diet.

## 2016-04-25 DIAGNOSIS — E119 Type 2 diabetes mellitus without complications: Secondary | ICD-10-CM

## 2016-04-25 DIAGNOSIS — J9601 Acute respiratory failure with hypoxia: Secondary | ICD-10-CM | POA: Diagnosis not present

## 2016-04-25 DIAGNOSIS — I700 Atherosclerosis of aorta: Secondary | ICD-10-CM | POA: Diagnosis not present

## 2016-04-25 DIAGNOSIS — J441 Chronic obstructive pulmonary disease with (acute) exacerbation: Secondary | ICD-10-CM | POA: Diagnosis not present

## 2016-04-25 LAB — GLUCOSE, CAPILLARY
GLUCOSE-CAPILLARY: 218 mg/dL — AB (ref 65–99)
GLUCOSE-CAPILLARY: 198 mg/dL — AB (ref 65–99)

## 2016-04-25 MED ORDER — DOXYCYCLINE HYCLATE 100 MG PO TABS: 100.0000 mg | ORAL_TABLET | Freq: Two times a day (BID) | ORAL | 0 refills | Status: DC

## 2016-04-25 MED ORDER — PREDNISONE 20 MG PO TABS: 40.0000 mg | ORAL_TABLET | Freq: Every day | ORAL | Status: DC

## 2016-04-25 MED ORDER — PREDNISONE 10 MG PO TABS: ORAL_TABLET | ORAL | 0 refills | Status: DC

## 2016-04-25 NOTE — Progress Notes (Signed)
PROGRESS NOTE  [name] [id number] DOB: Feb 18, 1946 DOA: 04/24/2016 PCP: Wende Neighbors, MD  Brief Narrative: 70 year old woman PMH asthma/COPD, diabetes mellitus type 2, presented to the emergency department with several day history of cough and worsening shortness of breath as well as vomiting. Admitted for asthma exacerbation.  Assessment/Plan: 1. Acute hypoxic resp failure secondary to asthma/COPD exacerbation/bronchitis. Flu negative. Nearly resolved. 2. N/v. Resolved. Suspect viral illness.  3. ASCVD, asymptomatic. 4. DM type 2, stable 5. Chronic elevation AST, chronic elevation of transaminases. Follow up as an outpatient as clinically indicated. 6. HTN, hyperlipidemia, hypothyroidism 7. Tobacco use disorder in remission 8. 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 Aortic atherosclerosis   Much improved. Plan home today and steroid taper and oral doxycycline.   Murray Hodgkins, MD  [facility] Direct contact: [phone] --Via amion app OR  --[email or web address]; password [email or web address]  7PM-7AM contact night coverage as above 04/25/2016, 11:21 AM  LOS: 0 days   Consultants:    Procedures:    Antimicrobials:  Doxycycline 11/25 >> 12/1  Interval history/Subjective: Feels much better, breathing much better, no n/v.    Objective: Vitals:   04/24/16 1455 04/24/16 1528 04/24/16 2026 04/25/16 0500  BP: 111/57 116/61 117/63 129/79  Pulse:  77 73 70  Resp: 23 20 (!) 22 20  Temp:  98.2 F (36.8 C) 98.9 F (37.2 C) 97.7 F (36.5 C)  TempSrc:  Oral Oral Oral  SpO2:  98% 99% 92%  Weight:  93.5 kg (206 lb 2.1 oz)    Height:  5\' 1"  (1.549 m)      Intake/Output Summary (Last 24 hours) at 04/25/16 1121 Last data filed at 04/25/16 0900  Gross per 24 hour  Intake              743 ml  Output                2 ml  Net              741 ml     Filed Weights   04/24/16 0959 04/24/16 1528  Weight: 92.1 kg (203 lb) 93.5 kg (206 lb 2.1 oz)    Exam:    Constitutional:    . Appears calm and comfortable Respiratory:  . CTA bilaterally with few basilar crackles . Respiratory effort normal. No retractions or accessory muscle use Cardiovascular:  . RRR, no m/r/g . No LE extremity edema   Psychiatric:  . Mental status o Mood, affect appropriate  I have personally reviewed following labs and imaging studies:  Blood sugars stable  Trivial elevation of lactic acid  Scheduled Meds: . amitriptyline  50 mg Oral QHS  . amLODipine  10 mg Oral QHS  . aspirin  81 mg Oral QHS  . atorvastatin  40 mg Oral q1800  . benzonatate  200 mg Oral TID  . doxycycline  100 mg Oral Q12H  . glipiZIDE  5 mg Oral Q breakfast  . insulin aspart  0-5 Units Subcutaneous QHS  . insulin aspart  0-9 Units Subcutaneous TID WC  . levothyroxine  100 mcg Oral QAC breakfast  . magnesium oxide  400 mg Oral BID  . methylPREDNISolone (SOLU-MEDROL) injection  40 mg Intravenous Q12H  . metoprolol succinate  50 mg Oral Daily  . pantoprazole  40 mg Oral Daily  . potassium chloride SA  20 mEq Oral Daily  . sodium chloride flush  3 mL Intravenous Q12H   Continuous Infusions:  Principal Problem:   Acute respiratory failure with  hypoxia ([place]) Active Problems:   BLINDNESS   Tobacco abuse, in remission   COPD exacerbation ([place])   DM type 2 (diabetes mellitus, type 2) ([place])   Aortic atherosclerosis ([place])   LOS: 0 days

## 2016-04-25 NOTE — Progress Notes (Signed)
Patient with orders to be discharge home. Discharge instructions given, patient verbalized understanding. Prescriptions given. Patient stable. Patient left in private vehicle with family.  

## 2016-04-25 NOTE — Discharge Summary (Addendum)
Physician Discharge Summary  [name] [id number] DOB: 08-01-1945 DOA: 04/24/2016  PCP: Wende Neighbors, MD  Admit date: 04/24/2016 Discharge date: 04/25/2016  Recommendations for Outpatient Follow-up:  1. Resolution of acute bronchitis/asthma exacerbation 2. Chronic elevation AST, chronic elevation of transaminases. Follow up as an outpatient as clinically indicated. 3. Note urine culture was positive. But patient not symptomatic, therefore no treatment indicated (asymptomatic bacturia).  Follow-up Information    Wende Neighbors, MD. Schedule an appointment as soon as possible for a visit in 1 week(s).   Specialty:  Internal Medicine Contact information: [place] [address] [phone]          Discharge Diagnoses:  1. Acute hypoxic resp failure secondary to asthma/COPD exacerbation/bronchitis. 2. N/v.  3. DM type 2 4. Chronic elevation AST, chronic elevation of transaminases.  5. HTN, hyperlipidemia, hypothyroidism 6. Tobacco use disorder in remission 7. 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 Aortic atherosclerosis  Discharge Condition: improved Disposition: home  Diet recommendation: heart health, diabetic diet  Filed Weights   04/24/16 0959 04/24/16 1528  Weight: 92.1 kg (203 lb) 93.5 kg (206 lb 2.1 oz)    History of present illness:  70 year old woman PMH asthma/COPD, diabetes mellitus type 2, presented to the emergency department with several day history of cough and worsening shortness of breath as well as vomiting. Admitted for asthma exacerbation.  Hospital Course:  Treated with steroids, bronchodilators, abx with rapid clinical improvement. Hospitalization uncomplicated. Individual issues as below. 1. Acute hypoxic resp failure secondary to asthma/COPD exacerbation/bronchitis. Flu negative. Nearly resolved. 2. N/v. Resolved. Suspect viral illness.  3. ASCVD, asymptomatic. 4. DM type 2, stable 5. Chronic elevation AST, chronic elevation of  transaminases. Follow up as an outpatient as clinically indicated. 6. HTN, hyperlipidemia, hypothyroidism 7. Tobacco use disorder in remission 8. 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 Aortic atherosclerosis  Antimicrobials:  Doxycycline 11/25 >> 12/1  Discharge Instructions  Discharge Instructions    Diet - low sodium heart healthy    Complete by:  As directed    Diet Carb Modified    Complete by:  As directed    Discharge instructions    Complete by:  As directed    Call your physician or seek immediate medical attention for shortness of breath, cough, wheezing, fever or worsening of condition.   Increase activity slowly    Complete by:  As directed        Medication List    TAKE these medications   acetaminophen 650 MG CR tablet Commonly known as:  TYLENOL Take 1,300 mg by mouth every 8 (eight) hours as needed for pain.   albuterol 108 (90 Base) MCG/ACT inhaler Commonly known as:  PROVENTIL HFA;VENTOLIN HFA Inhale 1-2 puffs into the lungs every 6 (six) hours as needed for wheezing or shortness of breath.   amitriptyline 50 MG tablet Commonly known as:  ELAVIL Take 50 mg by mouth at bedtime.   amLODipine 10 MG tablet Commonly known as:  NORVASC Take 10 mg by mouth at bedtime.   aspirin 81 MG tablet Take 81 mg by mouth at bedtime.   atorvastatin 40 MG tablet Commonly known as:  LIPITOR Take 1 tablet (40 mg total) by mouth daily at 6 PM.   diazepam 5 MG tablet Commonly known as:  VALIUM Take 5 mg by mouth 2 (two) times daily as needed for anxiety. For anxiety   dicyclomine 10 MG capsule Commonly known as:  BENTYL TAKE ONE CAPSULE BY MOUTH 4 TIMES DAILY BEFORE  MEAL(S) AND AT BEDTIME   doxycycline 100 MG tablet Commonly known as:  VIBRA-TABS Take 1 tablet (100 mg total) by mouth every 12 (twelve) hours.   Fluticasone-Salmeterol 250-50 MCG/DOSE Aepb Commonly known as:  ADVAIR DISKUS Inhale 1 puff into the lungs 2 (two) times daily.   GLIPIZIDE XL 5 MG 24 hr  tablet Generic drug:  glipiZIDE 1 tablet at bedtime.   Levothyroxine Sodium 100 MCG Caps Take 100 mcg by mouth daily before breakfast.   losartan-hydrochlorothiazide 100-12.5 MG tablet Commonly known as:  HYZAAR 1 tablet at bedtime.   magnesium oxide 400 MG tablet Commonly known as:  MAG-OX Take 1 tablet (400 mg total) by mouth 2 (two) times daily. For 10 days   metoprolol succinate 50 MG 24 hr tablet Commonly known as:  TOPROL-XL TAKE ONE TABLET BY MOUTH ONCE DAILY WITH  OR  IMMEDIATELY  FOLLOWING  A  MEAL.   pantoprazole 40 MG tablet Commonly known as:  PROTONIX TAKE ONE TABLET BY MOUTH TWICE DAILY BEFORE  A  MEAL   potassium chloride SA 20 MEQ tablet Commonly known as:  K-DUR,KLOR-CON Take 1 tablet (20 mEq total) by mouth daily.   predniSONE 10 MG tablet Commonly known as:  DELTASONE Take daily by mouth: 40 mg x3 days, then 20 mg x3 days, then 10 mg x3 days, then stop.      Allergies  Allergen Reactions  . Codeine Anaphylaxis    REACTION: caused "cramping in hands" and hyperventilation.    The results of significant diagnostics from this hospitalization (including imaging, microbiology, ancillary and laboratory) are listed below for reference.    Significant Diagnostic Studies: Dg Chest 2 View  Result Date: 04/24/2016 CLINICAL DATA:  Productive cough for 3 days, nausea and vomiting since yesterday EXAM: CHEST  2 VIEW COMPARISON:  Chest x-ray dated 10/18/2014. FINDINGS: Heart size is upper normal. Atherosclerotic changes noted at the aortic arch. Mild diffuse interstitial prominence appears stable. No new lung findings. No pleural effusion or pneumothorax seen. Osseous and soft tissue structures about the chest are unremarkable. IMPRESSION: 1. No acute findings. No evidence of pneumonia or alveolar pulmonary edema. 2. Mild interstitial prominence, similar to previous exams, presumably chronic. 3. Aortic atherosclerosis. Electronically Signed   By: Franki Cabot M.D.    On: 04/24/2016 10:51    Labs: Basic Metabolic Panel:  Recent Labs Lab 04/24/16 1018  NA 132*  K 3.3*  CL 100*  CO2 24  GLUCOSE 93  BUN 16  CREATININE 1.18*  CALCIUM 8.6*   Liver Function Tests:  Recent Labs Lab 04/24/16 1018  AST 82*  ALT 54  ALKPHOS 73  BILITOT 0.6  PROT 9.7*  ALBUMIN 3.0*    Recent Labs Lab 04/24/16 1018  LIPASE 26   CBC:  Recent Labs Lab 04/24/16 1018  WBC 7.4  NEUTROABS 4.4  HGB 12.5  HCT 37.5  MCV 90.1  PLT 214   Cardiac Enzymes:  Recent Labs Lab 04/24/16 1018  TROPONINI <0.03    Recent Labs  04/24/16 1018  BNP 99.0   CBG:  Recent Labs Lab 04/24/16 1611 04/24/16 2030 04/25/16 0818 04/25/16 1137  GLUCAP 216* 286* 218* 198*    Principal Problem:   Acute respiratory failure with hypoxia (HCC) Active Problems:   BLINDNESS   Tobacco abuse, in remission   COPD exacerbation (H[facility])   DM type 2 (diabetes mellitus, type 2) ([place])   Aortic atherosclerosis ([place])   Time coordinating discharge: 20  Signed:  Murray Hodgkins, MD  [facility] 04/25/2016, 11:41 AM

## 2016-04-28 LAB — URINE CULTURE: Culture: >=100000 — AB

## 2016-05-03 IMAGING — CR DG CHEST 1V PORT
1 series · 1 of 1 positions shown · non-contrast
Comparison: 08/19/2014

CLINICAL DATA: Drug overdose, endotracheal tube placement

EXAM:
PORTABLE CHEST - 1 VIEW

[ap portable]
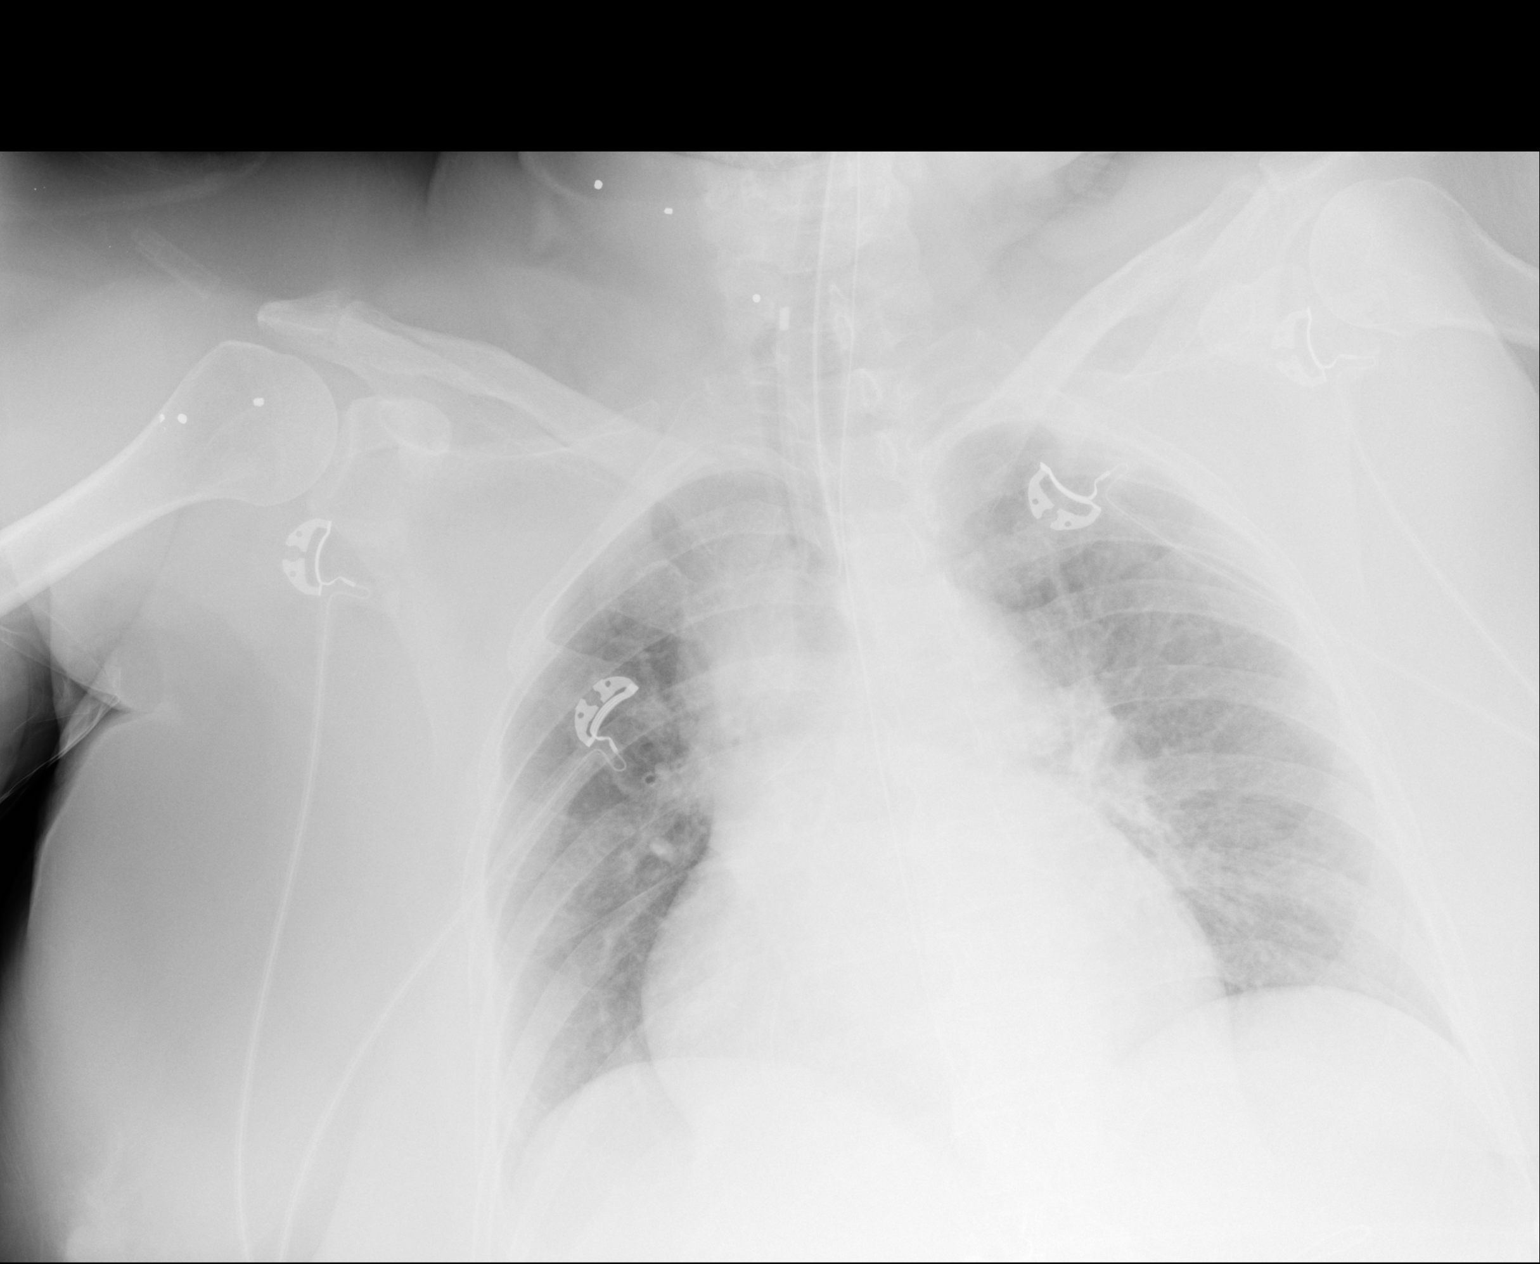

[1 of 1 positions shown; findings below may reference images not displayed]

FINDINGS: Cardiomediastinal silhouette is stable. Central mild vascular
congestion without pulmonary edema. NG tube in place. Endotracheal
tube in place with tip 3 cm above the carina. No pneumothorax.
IMPRESSION: Central mild vascular congestion without pulmonary edema. NG tube
and endotracheal tube in place. No pneumothorax.

## 2016-05-03 IMAGING — CR DG CHEST 1V PORT SAME DAY
1 series · 1 of 1 positions shown · non-contrast
Comparison: 10/17/2014

CLINICAL DATA: Drug overdose, central line placement

EXAM:
PORTABLE CHEST - 1 VIEW SAME DAY

[ap portable]
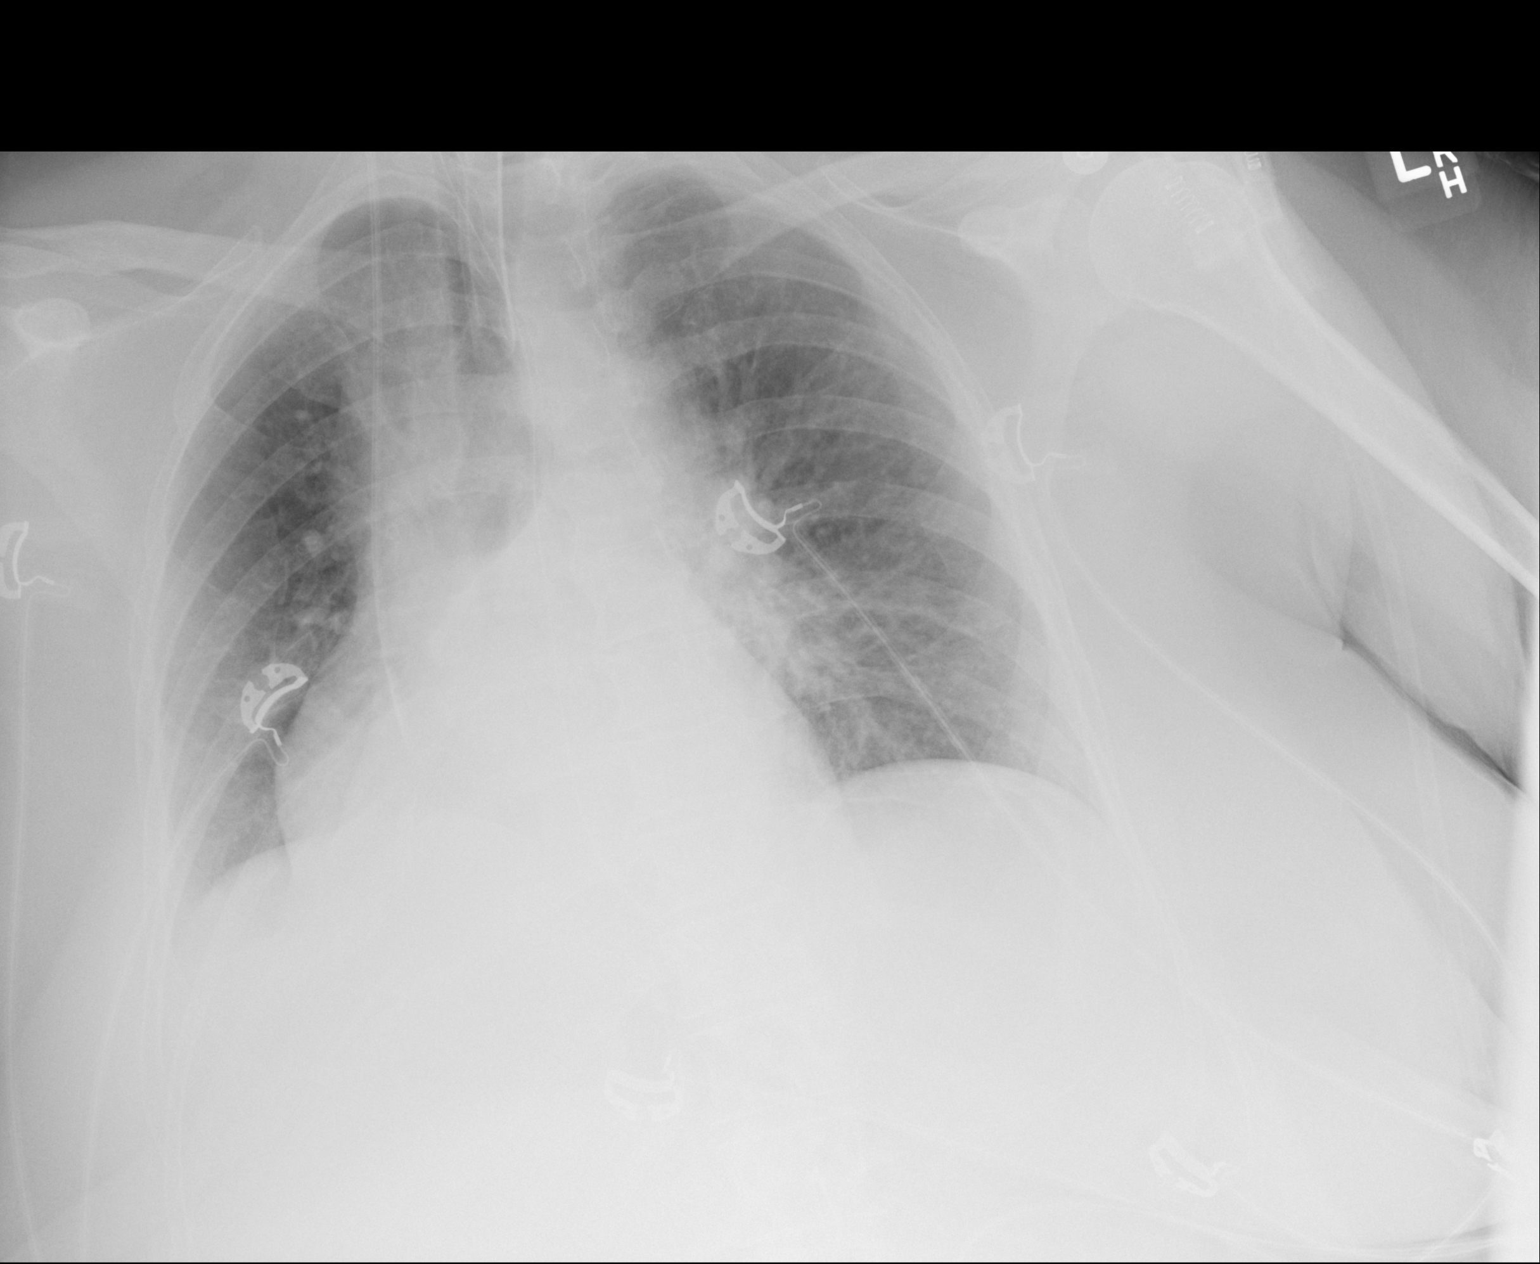

[1 of 1 positions shown; findings below may reference images not displayed]

FINDINGS: Interval placement of a right jugular central venous catheter with
the tip projecting over the cavoatrial junction.

Endotracheal tube with the tip 3.3 cm above the carina. Nasogastric
tube coursing below the diaphragm.

There is no focal parenchymal opacity. There is no pleural effusion
or pneumothorax. The heart and mediastinal contours are
unremarkable.

The osseous structures are unremarkable.
IMPRESSION: 1. Right jugular central venous catheter with the tip projecting
over the cavoatrial junction. No pneumothorax.

## 2016-05-15 DIAGNOSIS — R945 Abnormal results of liver function studies: Secondary | ICD-10-CM | POA: Diagnosis not present

## 2016-05-15 DIAGNOSIS — J441 Chronic obstructive pulmonary disease with (acute) exacerbation: Secondary | ICD-10-CM | POA: Diagnosis not present

## 2016-06-07 ENCOUNTER — Other Ambulatory Visit: Payer: Self-pay | Admitting: Cardiology

## 2016-06-28 ENCOUNTER — Other Ambulatory Visit: Payer: Self-pay | Admitting: Cardiology

## 2016-07-25 ENCOUNTER — Emergency Department (HOSPITAL_COMMUNITY): Payer: Medicare Other

## 2016-07-25 ENCOUNTER — Encounter (HOSPITAL_COMMUNITY): Payer: Self-pay | Admitting: Emergency Medicine

## 2016-07-25 ENCOUNTER — Emergency Department (HOSPITAL_COMMUNITY)
Admission: EM | Admit: 2016-07-25 | Discharge: 2016-07-25 | Disposition: A | Discharge status: Home / Self Care | Payer: Medicare Other | Attending: Emergency Medicine | Admitting: Emergency Medicine

## 2016-07-25 DIAGNOSIS — Z7982 Long term (current) use of aspirin: Secondary | ICD-10-CM | POA: Insufficient documentation

## 2016-07-25 DIAGNOSIS — Z87891 Personal history of nicotine dependence: Secondary | ICD-10-CM | POA: Diagnosis not present

## 2016-07-25 DIAGNOSIS — Z79899 Other long term (current) drug therapy: Secondary | ICD-10-CM | POA: Insufficient documentation

## 2016-07-25 DIAGNOSIS — E119 Type 2 diabetes mellitus without complications: Secondary | ICD-10-CM | POA: Diagnosis not present

## 2016-07-25 DIAGNOSIS — I2510 Atherosclerotic heart disease of native coronary artery without angina pectoris: Secondary | ICD-10-CM | POA: Diagnosis not present

## 2016-07-25 DIAGNOSIS — J449 Chronic obstructive pulmonary disease, unspecified: Secondary | ICD-10-CM | POA: Insufficient documentation

## 2016-07-25 DIAGNOSIS — E039 Hypothyroidism, unspecified: Secondary | ICD-10-CM | POA: Insufficient documentation

## 2016-07-25 DIAGNOSIS — J189 Pneumonia, unspecified organism: Secondary | ICD-10-CM | POA: Diagnosis not present

## 2016-07-25 DIAGNOSIS — I10 Essential (primary) hypertension: Secondary | ICD-10-CM | POA: Diagnosis not present

## 2016-07-25 DIAGNOSIS — J181 Lobar pneumonia, unspecified organism: Secondary | ICD-10-CM | POA: Diagnosis not present

## 2016-07-25 DIAGNOSIS — R05 Cough: Secondary | ICD-10-CM | POA: Diagnosis not present

## 2016-07-25 LAB — CBC WITH DIFFERENTIAL/PLATELET
BASOS ABS: 0 10*3/uL (ref 0.0–0.1)
BASOS PCT: 1 %
Eosinophils Absolute: 0.1 10*3/uL (ref 0.0–0.7)
Eosinophils Relative: 2 %
HEMATOCRIT: 39.7 % (ref 36.0–46.0)
HEMOGLOBIN: 13.2 g/dL (ref 12.0–15.0)
LYMPHS PCT: 28 %
Lymphs Abs: 1.5 10*3/uL (ref 0.7–4.0)
MCH: 31.3 pg (ref 26.0–34.0)
MCHC: 33.2 g/dL (ref 30.0–36.0)
MCV: 94.1 fL (ref 78.0–100.0)
MONO ABS: 0.5 10*3/uL (ref 0.1–1.0)
MONOS PCT: 8 %
NEUTROS ABS: 3.3 10*3/uL (ref 1.7–7.7)
NEUTROS PCT: 61 %
Platelets: 208 10*3/uL (ref 150–400)
RBC: 4.22 MIL/uL (ref 3.87–5.11)
RDW: 15.8 % — ABNORMAL HIGH (ref 11.5–15.5)
WBC: 5.4 10*3/uL (ref 4.0–10.5)

## 2016-07-25 LAB — BASIC METABOLIC PANEL
ANION GAP: 8 (ref 5–15)
BUN: 16 mg/dL (ref 6–20)
CHLORIDE: 102 mmol/L (ref 101–111)
CO2: 23 mmol/L (ref 22–32)
Calcium: 9 mg/dL (ref 8.9–10.3)
Creatinine, Ser: 1.28 mg/dL — ABNORMAL HIGH (ref 0.44–1.00)
GFR calc non Af Amer: 42 mL/min — ABNORMAL LOW (ref >60)
GFR, EST AFRICAN AMERICAN: 49 mL/min — AB (ref >60)
GLUCOSE: 190 mg/dL — AB (ref 65–99)
Potassium: 3.7 mmol/L (ref 3.5–5.1)
Sodium: 133 mmol/L — ABNORMAL LOW (ref 135–145)

## 2016-07-25 MED ORDER — LEVOFLOXACIN 500 MG PO TABS: 500.0000 mg | ORAL_TABLET | Freq: Every day | ORAL | 0 refills | Status: DC

## 2016-07-25 MED ORDER — ALBUTEROL SULFATE (2.5 MG/3ML) 0.083% IN NEBU
2.5000 mg | INHALATION_SOLUTION | Freq: Once | RESPIRATORY_TRACT | Status: AC
  Administered 2016-07-25: 2.5 mg via RESPIRATORY_TRACT
  Filled 2016-07-25: qty 3

## 2016-07-25 MED ORDER — LEVOFLOXACIN 500 MG PO TABS
500.0000 mg | ORAL_TABLET | Freq: Once | ORAL | Status: AC
  Administered 2016-07-25: 500 mg via ORAL
  Filled 2016-07-25: qty 1

## 2016-07-25 MED ORDER — IPRATROPIUM-ALBUTEROL 0.5-2.5 (3) MG/3ML IN SOLN
3.0000 mL | Freq: Once | RESPIRATORY_TRACT | Status: AC
  Administered 2016-07-25: 3 mL via RESPIRATORY_TRACT
  Filled 2016-07-25: qty 3

## 2016-07-25 MED ORDER — ALBUTEROL SULFATE HFA 108 (90 BASE) MCG/ACT IN AERS
2.0000 | INHALATION_SPRAY | RESPIRATORY_TRACT | Status: DC | PRN
  Administered 2016-07-25: 2 via RESPIRATORY_TRACT
  Filled 2016-07-25: qty 6.7
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
## 2016-07-25 NOTE — Discharge Instructions (Signed)
Follow up with your md this week for recheck.  Use the inhaler every 4-6 hours for shortness of breath or wheezing.

## 2016-07-25 NOTE — ED Notes (Signed)
Pt reports a cough since last Sunday- She speaks in complete sentences and her voice is clear-  She is visually impaired- Dr Nevada Crane is her physician

## 2016-07-25 NOTE — ED Triage Notes (Signed)
Patient c/o cough that is occasionally productive x1 week. Per patient coughing to point of vomiting but denies any nausea or diarrhea. Patient states taking Mucinex and using an inhaler with no relief. Patient unsure of fevers but states "I think so cause I woke in sweat this morning."

## 2016-10-16 ENCOUNTER — Other Ambulatory Visit: Payer: Self-pay | Admitting: Gastroenterology

## 2016-10-18 NOTE — Telephone Encounter (Signed)
Patient last seen 07/2012. Needs ov prior to further prescriptions.

## 2016-10-18 NOTE — Telephone Encounter (Signed)
Tried to call patient.

## 2016-10-19 ENCOUNTER — Encounter: Payer: Self-pay | Admitting: Gastroenterology

## 2016-10-19 NOTE — Telephone Encounter (Signed)
Please schedule ov.  

## 2016-10-26 ENCOUNTER — Other Ambulatory Visit: Payer: Self-pay | Admitting: Gastroenterology

## 2016-10-28 DIAGNOSIS — E1142 Type 2 diabetes mellitus with diabetic polyneuropathy: Secondary | ICD-10-CM | POA: Diagnosis not present

## 2016-10-28 DIAGNOSIS — E782 Mixed hyperlipidemia: Secondary | ICD-10-CM | POA: Diagnosis not present

## 2016-10-28 DIAGNOSIS — E039 Hypothyroidism, unspecified: Secondary | ICD-10-CM | POA: Diagnosis not present

## 2016-11-02 DIAGNOSIS — E1142 Type 2 diabetes mellitus with diabetic polyneuropathy: Secondary | ICD-10-CM | POA: Diagnosis not present

## 2016-11-02 DIAGNOSIS — Z0000 Encounter for general adult medical examination without abnormal findings: Secondary | ICD-10-CM | POA: Diagnosis not present

## 2016-11-02 DIAGNOSIS — E039 Hypothyroidism, unspecified: Secondary | ICD-10-CM | POA: Diagnosis not present

## 2016-11-02 DIAGNOSIS — M797 Fibromyalgia: Secondary | ICD-10-CM | POA: Diagnosis not present

## 2016-11-02 DIAGNOSIS — I10 Essential (primary) hypertension: Secondary | ICD-10-CM | POA: Diagnosis not present

## 2016-11-02 DIAGNOSIS — M1990 Unspecified osteoarthritis, unspecified site: Secondary | ICD-10-CM | POA: Diagnosis not present

## 2016-11-02 DIAGNOSIS — F331 Major depressive disorder, recurrent, moderate: Secondary | ICD-10-CM | POA: Diagnosis not present

## 2016-11-02 DIAGNOSIS — E782 Mixed hyperlipidemia: Secondary | ICD-10-CM | POA: Diagnosis not present

## 2016-11-02 DIAGNOSIS — N390 Urinary tract infection, site not specified: Secondary | ICD-10-CM | POA: Diagnosis not present

## 2016-11-02 DIAGNOSIS — R945 Abnormal results of liver function studies: Secondary | ICD-10-CM | POA: Diagnosis not present

## 2016-11-16 ENCOUNTER — Ambulatory Visit: Payer: Medicare Other | Admitting: Gastroenterology

## 2016-12-03 DIAGNOSIS — M797 Fibromyalgia: Secondary | ICD-10-CM | POA: Diagnosis not present

## 2016-12-06 ENCOUNTER — Other Ambulatory Visit: Payer: Self-pay | Admitting: Cardiology

## 2016-12-14 ENCOUNTER — Emergency Department (HOSPITAL_COMMUNITY): Payer: Medicare Other

## 2016-12-14 ENCOUNTER — Encounter (HOSPITAL_COMMUNITY): Payer: Self-pay | Admitting: Emergency Medicine

## 2016-12-14 ENCOUNTER — Inpatient Hospital Stay (HOSPITAL_COMMUNITY)
Admission: EM | Admit: 2016-12-14 | Discharge: 2016-12-21 | DRG: 871 | Disposition: A | Discharge status: Home / Self Care | Payer: Medicare Other | Attending: Internal Medicine | Admitting: Internal Medicine

## 2016-12-14 DIAGNOSIS — J9600 Acute respiratory failure, unspecified whether with hypoxia or hypercapnia: Secondary | ICD-10-CM

## 2016-12-14 DIAGNOSIS — E039 Hypothyroidism, unspecified: Secondary | ICD-10-CM | POA: Diagnosis present

## 2016-12-14 DIAGNOSIS — Z885 Allergy status to narcotic agent status: Secondary | ICD-10-CM | POA: Diagnosis not present

## 2016-12-14 DIAGNOSIS — E785 Hyperlipidemia, unspecified: Secondary | ICD-10-CM | POA: Diagnosis present

## 2016-12-14 DIAGNOSIS — Z79899 Other long term (current) drug therapy: Secondary | ICD-10-CM

## 2016-12-14 DIAGNOSIS — G9341 Metabolic encephalopathy: Secondary | ICD-10-CM

## 2016-12-14 DIAGNOSIS — H548 Legal blindness, as defined in USA: Secondary | ICD-10-CM | POA: Diagnosis not present

## 2016-12-14 DIAGNOSIS — J9601 Acute respiratory failure with hypoxia: Secondary | ICD-10-CM | POA: Diagnosis not present

## 2016-12-14 DIAGNOSIS — E1122 Type 2 diabetes mellitus with diabetic chronic kidney disease: Secondary | ICD-10-CM | POA: Diagnosis not present

## 2016-12-14 DIAGNOSIS — I10 Essential (primary) hypertension: Secondary | ICD-10-CM | POA: Diagnosis not present

## 2016-12-14 DIAGNOSIS — A419 Sepsis, unspecified organism: Principal | ICD-10-CM

## 2016-12-14 DIAGNOSIS — Z7951 Long term (current) use of inhaled steroids: Secondary | ICD-10-CM | POA: Diagnosis not present

## 2016-12-14 DIAGNOSIS — N183 Chronic kidney disease, stage 3 unspecified: Secondary | ICD-10-CM

## 2016-12-14 DIAGNOSIS — I952 Hypotension due to drugs: Secondary | ICD-10-CM | POA: Diagnosis present

## 2016-12-14 DIAGNOSIS — Z87891 Personal history of nicotine dependence: Secondary | ICD-10-CM

## 2016-12-14 DIAGNOSIS — E669 Obesity, unspecified: Secondary | ICD-10-CM | POA: Diagnosis present

## 2016-12-14 DIAGNOSIS — Z7982 Long term (current) use of aspirin: Secondary | ICD-10-CM

## 2016-12-14 DIAGNOSIS — K219 Gastro-esophageal reflux disease without esophagitis: Secondary | ICD-10-CM | POA: Diagnosis present

## 2016-12-14 DIAGNOSIS — I130 Hypertensive heart and chronic kidney disease with heart failure and stage 1 through stage 4 chronic kidney disease, or unspecified chronic kidney disease: Secondary | ICD-10-CM | POA: Diagnosis present

## 2016-12-14 DIAGNOSIS — K589 Irritable bowel syndrome without diarrhea: Secondary | ICD-10-CM | POA: Diagnosis present

## 2016-12-14 DIAGNOSIS — I2510 Atherosclerotic heart disease of native coronary artery without angina pectoris: Secondary | ICD-10-CM | POA: Diagnosis not present

## 2016-12-14 DIAGNOSIS — I5031 Acute diastolic (congestive) heart failure: Secondary | ICD-10-CM | POA: Diagnosis not present

## 2016-12-14 DIAGNOSIS — M25551 Pain in right hip: Secondary | ICD-10-CM | POA: Diagnosis not present

## 2016-12-14 DIAGNOSIS — H547 Unspecified visual loss: Secondary | ICD-10-CM | POA: Diagnosis present

## 2016-12-14 DIAGNOSIS — J181 Lobar pneumonia, unspecified organism: Secondary | ICD-10-CM | POA: Diagnosis present

## 2016-12-14 DIAGNOSIS — I6789 Other cerebrovascular disease: Secondary | ICD-10-CM | POA: Diagnosis not present

## 2016-12-14 DIAGNOSIS — M79605 Pain in left leg: Secondary | ICD-10-CM

## 2016-12-14 DIAGNOSIS — E876 Hypokalemia: Secondary | ICD-10-CM | POA: Diagnosis present

## 2016-12-14 DIAGNOSIS — J811 Chronic pulmonary edema: Secondary | ICD-10-CM

## 2016-12-14 DIAGNOSIS — J13 Pneumonia due to Streptococcus pneumoniae: Secondary | ICD-10-CM | POA: Diagnosis not present

## 2016-12-14 DIAGNOSIS — E2740 Unspecified adrenocortical insufficiency: Secondary | ICD-10-CM

## 2016-12-14 DIAGNOSIS — E119 Type 2 diabetes mellitus without complications: Secondary | ICD-10-CM

## 2016-12-14 DIAGNOSIS — R404 Transient alteration of awareness: Secondary | ICD-10-CM | POA: Diagnosis not present

## 2016-12-14 DIAGNOSIS — J440 Chronic obstructive pulmonary disease with acute lower respiratory infection: Secondary | ICD-10-CM | POA: Diagnosis present

## 2016-12-14 DIAGNOSIS — I509 Heart failure, unspecified: Secondary | ICD-10-CM | POA: Diagnosis not present

## 2016-12-14 DIAGNOSIS — I340 Nonrheumatic mitral (valve) insufficiency: Secondary | ICD-10-CM | POA: Diagnosis not present

## 2016-12-14 DIAGNOSIS — D696 Thrombocytopenia, unspecified: Secondary | ICD-10-CM

## 2016-12-14 DIAGNOSIS — Z6839 Body mass index (BMI) 39.0-39.9, adult: Secondary | ICD-10-CM

## 2016-12-14 DIAGNOSIS — E66811 Obesity, class 1: Secondary | ICD-10-CM | POA: Diagnosis present

## 2016-12-14 LAB — COMPREHENSIVE METABOLIC PANEL
ALBUMIN: 3.4 g/dL — AB (ref 3.5–5.0)
ALT: 47 U/L (ref 14–54)
ANION GAP: 8 (ref 5–15)
AST: 75 U/L — ABNORMAL HIGH (ref 15–41)
Alkaline Phosphatase: 61 U/L (ref 38–126)
BUN: 24 mg/dL — ABNORMAL HIGH (ref 6–20)
CALCIUM: 8.5 mg/dL — AB (ref 8.9–10.3)
CHLORIDE: 100 mmol/L — AB (ref 101–111)
CO2: 24 mmol/L (ref 22–32)
Creatinine, Ser: 1.29 mg/dL — ABNORMAL HIGH (ref 0.44–1.00)
GFR calc non Af Amer: 41 mL/min — ABNORMAL LOW (ref >60)
GFR, EST AFRICAN AMERICAN: 48 mL/min — AB (ref >60)
GLUCOSE: 121 mg/dL — AB (ref 65–99)
Potassium: 3.5 mmol/L (ref 3.5–5.1)
SODIUM: 132 mmol/L — AB (ref 135–145)
Total Bilirubin: 0.7 mg/dL (ref 0.3–1.2)
Total Protein: 9.2 g/dL — ABNORMAL HIGH (ref 6.5–8.1)

## 2016-12-14 LAB — URINALYSIS, ROUTINE W REFLEX MICROSCOPIC
BILIRUBIN URINE: NEGATIVE
Glucose, UA: NEGATIVE mg/dL
HGB URINE DIPSTICK: NEGATIVE
KETONES UR: NEGATIVE mg/dL
Leukocytes, UA: NEGATIVE
NITRITE: NEGATIVE
Protein, ur: NEGATIVE mg/dL
SPECIFIC GRAVITY, URINE: 1.018 (ref 1.005–1.030)
pH: 5 (ref 5.0–8.0)

## 2016-12-14 LAB — CBC WITH DIFFERENTIAL/PLATELET
BASOS PCT: 0 %
Basophils Absolute: 0 10*3/uL (ref 0.0–0.1)
Eosinophils Absolute: 0 10*3/uL (ref 0.0–0.7)
Eosinophils Relative: 0 %
HEMATOCRIT: 34.4 % — AB (ref 36.0–46.0)
HEMOGLOBIN: 11.1 g/dL — AB (ref 12.0–15.0)
LYMPHS ABS: 1.5 10*3/uL (ref 0.7–4.0)
LYMPHS PCT: 16 %
MCH: 30.7 pg (ref 26.0–34.0)
MCHC: 32.3 g/dL (ref 30.0–36.0)
MCV: 95.3 fL (ref 78.0–100.0)
MONO ABS: 0.7 10*3/uL (ref 0.1–1.0)
Monocytes Relative: 7 %
NEUTROS ABS: 7.7 10*3/uL (ref 1.7–7.7)
NEUTROS PCT: 77 %
Platelets: 180 10*3/uL (ref 150–400)
RBC: 3.61 MIL/uL — ABNORMAL LOW (ref 3.87–5.11)
RDW: 15.9 % — AB (ref 11.5–15.5)
WBC: 10 10*3/uL (ref 4.0–10.5)

## 2016-12-14 LAB — LACTIC ACID, PLASMA
LACTIC ACID, VENOUS: 1.7 mmol/L (ref 0.5–1.9)
Lactic Acid, Venous: 3.3 mmol/L (ref 0.5–1.9)

## 2016-12-14 LAB — I-STAT CG4 LACTIC ACID, ED: Lactic Acid, Venous: 2.69 mmol/L (ref 0.5–1.9)

## 2016-12-14 LAB — GLUCOSE, CAPILLARY: GLUCOSE-CAPILLARY: 120 mg/dL — AB (ref 65–99)

## 2016-12-14 LAB — CBG MONITORING, ED: Glucose-Capillary: 126 mg/dL — ABNORMAL HIGH (ref 65–99)

## 2016-12-14 LAB — MRSA PCR SCREENING: MRSA by PCR: NEGATIVE

## 2016-12-14 MED ORDER — ONDANSETRON HCL 4 MG/2ML IJ SOLN
INTRAMUSCULAR | Status: AC
  Administered 2016-12-14: 4 mg
  Filled 2016-12-14: qty 2

## 2016-12-14 MED ORDER — SODIUM CHLORIDE 0.9% FLUSH
3.0000 mL | Freq: Two times a day (BID) | INTRAVENOUS | Status: DC
  Administered 2016-12-14 – 2016-12-16 (×4): 3 mL via INTRAVENOUS
  Administered 2016-12-17: 10 mL via INTRAVENOUS
  Administered 2016-12-17 – 2016-12-21 (×6): 3 mL via INTRAVENOUS

## 2016-12-14 MED ORDER — ALBUTEROL SULFATE (2.5 MG/3ML) 0.083% IN NEBU: 3.0000 mL | INHALATION_SOLUTION | Freq: Four times a day (QID) | RESPIRATORY_TRACT | Status: DC | PRN

## 2016-12-14 MED ORDER — VANCOMYCIN HCL IN DEXTROSE 1-5 GM/200ML-% IV SOLN
1000.0000 mg | INTRAVENOUS | Status: AC
  Administered 2016-12-14: 1000 mg via INTRAVENOUS
  Filled 2016-12-14: qty 200

## 2016-12-14 MED ORDER — HEPARIN SODIUM (PORCINE) 5000 UNIT/ML IJ SOLN
5000.0000 [IU] | Freq: Three times a day (TID) | INTRAMUSCULAR | Status: DC
  Administered 2016-12-14 – 2016-12-21 (×20): 5000 [IU] via SUBCUTANEOUS
  Filled 2016-12-14 (×20): qty 1

## 2016-12-14 MED ORDER — VANCOMYCIN HCL IN DEXTROSE 1-5 GM/200ML-% IV SOLN
1000.0000 mg | INTRAVENOUS | Status: AC
Start: 2016-12-14 — End: 2016-12-14
  Administered 2016-12-14: 1000 mg via INTRAVENOUS
  Filled 2016-12-14: qty 200

## 2016-12-14 MED ORDER — ONDANSETRON HCL 4 MG/2ML IJ SOLN: 4.0000 mg | Freq: Four times a day (QID) | INTRAMUSCULAR | Status: DC

## 2016-12-14 MED ORDER — ACETAMINOPHEN ER 650 MG PO TBCR: 1300.0000 mg | EXTENDED_RELEASE_TABLET | Freq: Three times a day (TID) | ORAL | Status: DC

## 2016-12-14 MED ORDER — SODIUM CHLORIDE 0.9 % IV BOLUS (SEPSIS)
1000.0000 mL | Freq: Once | INTRAVENOUS | Status: AC
Start: 2016-12-14 — End: 2016-12-14
  Administered 2016-12-14: 1000 mL via INTRAVENOUS

## 2016-12-14 MED ORDER — ACETAMINOPHEN 325 MG PO TABS
650.0000 mg | ORAL_TABLET | Freq: Four times a day (QID) | ORAL | Status: DC | PRN
Start: 2016-12-14 — End: 2016-12-21
  Administered 2016-12-15: 650 mg via ORAL
  Filled 2016-12-14: qty 2

## 2016-12-14 MED ORDER — AMITRIPTYLINE HCL 25 MG PO TABS
50.0000 mg | ORAL_TABLET | Freq: Every day | ORAL | Status: DC
  Administered 2016-12-15 – 2016-12-20 (×6): 50 mg via ORAL
  Filled 2016-12-14 (×7): qty 2

## 2016-12-14 MED ORDER — SODIUM CHLORIDE 0.9 % IV BOLUS (SEPSIS)
1000.0000 mL | Freq: Once | INTRAVENOUS | Status: AC
  Administered 2016-12-14: 1000 mL via INTRAVENOUS

## 2016-12-14 MED ORDER — SODIUM CHLORIDE 0.9 % IV SOLN
INTRAVENOUS | Status: DC
  Administered 2016-12-14: 23:00:00 via INTRAVENOUS

## 2016-12-14 MED ORDER — ONDANSETRON HCL 4 MG/2ML IJ SOLN
4.0000 mg | Freq: Four times a day (QID) | INTRAMUSCULAR | Status: DC | PRN
  Administered 2016-12-15 – 2016-12-16 (×2): 4 mg via INTRAVENOUS
  Filled 2016-12-14 (×2): qty 2

## 2016-12-14 MED ORDER — PANTOPRAZOLE SODIUM 40 MG PO TBEC: 40.0000 mg | DELAYED_RELEASE_TABLET | Freq: Every day | ORAL | Status: DC

## 2016-12-14 MED ORDER — INSULIN ASPART 100 UNIT/ML ~~LOC~~ SOLN
0.0000 [IU] | Freq: Three times a day (TID) | SUBCUTANEOUS | Status: DC
  Administered 2016-12-15: 3 [IU] via SUBCUTANEOUS
  Administered 2016-12-15 (×2): 2 [IU] via SUBCUTANEOUS
  Administered 2016-12-16: 3 [IU] via SUBCUTANEOUS
  Administered 2016-12-16: 2 [IU] via SUBCUTANEOUS
  Administered 2016-12-16: 5 [IU] via SUBCUTANEOUS
  Administered 2016-12-17: 2 [IU] via SUBCUTANEOUS
  Administered 2016-12-17: 3 [IU] via SUBCUTANEOUS
  Administered 2016-12-17: 5 [IU] via SUBCUTANEOUS
  Administered 2016-12-18: 2 [IU] via SUBCUTANEOUS
  Administered 2016-12-18 – 2016-12-19 (×2): 3 [IU] via SUBCUTANEOUS
  Administered 2016-12-19: 2 [IU] via SUBCUTANEOUS
  Administered 2016-12-19: 3 [IU] via SUBCUTANEOUS
  Administered 2016-12-20: 5 [IU] via SUBCUTANEOUS
  Administered 2016-12-20 – 2016-12-21 (×3): 3 [IU] via SUBCUTANEOUS

## 2016-12-14 MED ORDER — LEVOTHYROXINE SODIUM 100 MCG PO TABS: 100.0000 ug | ORAL_TABLET | Freq: Every day | ORAL | Status: DC

## 2016-12-14 MED ORDER — VANCOMYCIN HCL IN DEXTROSE 1-5 GM/200ML-% IV SOLN: 1000.0000 mg | Freq: Once | INTRAVENOUS | Status: DC

## 2016-12-14 MED ORDER — PIPERACILLIN-TAZOBACTAM 3.375 G IVPB 30 MIN
3.3750 g | Freq: Once | INTRAVENOUS | Status: AC
  Administered 2016-12-14: 3.375 g via INTRAVENOUS
  Filled 2016-12-14: qty 50

## 2016-12-14 MED ORDER — MOMETASONE FURO-FORMOTEROL FUM 200-5 MCG/ACT IN AERO
2.0000 | INHALATION_SPRAY | Freq: Two times a day (BID) | RESPIRATORY_TRACT | Status: DC
  Administered 2016-12-15 – 2016-12-21 (×13): 2 via RESPIRATORY_TRACT
  Filled 2016-12-14 (×2): qty 8.8

## 2016-12-14 MED ORDER — ASPIRIN 81 MG PO CHEW
81.0000 mg | CHEWABLE_TABLET | Freq: Every day | ORAL | Status: DC
  Administered 2016-12-15 – 2016-12-20 (×6): 81 mg via ORAL
  Filled 2016-12-14 (×6): qty 1

## 2016-12-14 MED ORDER — INSULIN ASPART 100 UNIT/ML ~~LOC~~ SOLN: 0.0000 [IU] | Freq: Every day | SUBCUTANEOUS | Status: DC

## 2016-12-14 MED ORDER — FUROSEMIDE 10 MG/ML IJ SOLN
20.0000 mg | Freq: Once | INTRAMUSCULAR | Status: AC
  Administered 2016-12-14: 20 mg via INTRAVENOUS
  Filled 2016-12-14: qty 2

## 2016-12-14 MED ORDER — ATORVASTATIN CALCIUM 40 MG PO TABS
40.0000 mg | ORAL_TABLET | Freq: Every day | ORAL | Status: DC
  Administered 2016-12-15 – 2016-12-20 (×6): 40 mg via ORAL
  Filled 2016-12-14 (×6): qty 1

## 2016-12-14 MED ORDER — DEXTROSE 5 % IV SOLN
500.0000 mg | INTRAVENOUS | Status: DC
  Administered 2016-12-14 – 2016-12-19 (×6): 500 mg via INTRAVENOUS
  Filled 2016-12-14 (×8): qty 500

## 2016-12-14 NOTE — ED Notes (Signed)
Lab notified of need for blood cultures to be drawn.
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
## 2016-12-14 NOTE — ED Notes (Signed)
#   2 and #3 NS bolus stopped per Dr Truman Hayward verbal order.  Total infused prior to stopping was 200 ml as pt IV is positional.
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
## 2016-12-14 NOTE — ED Triage Notes (Signed)
Per EMS pt fell at home and was found behind the tv. SO states pt is confused and has had this happen once before with fully resolved sx. Per EMS LKW 1100 today. Pt states today is Sunday and she was going to church because it is Easter Sunday. Oriented to place, person, and situation. Legally blind.

## 2016-12-14 NOTE — ED Notes (Addendum)
Lab at bedside drawing 2 sets of blood cultures.

## 2016-12-14 NOTE — ED Notes (Addendum)
Date and time results received: 12/14/16 7:05 PM   Test: lactic acid Critical Value: 2.69  Name of Provider Notified: Dr Lita Mains  Orders Received? Or Actions Taken?: Orders Received - See Orders for details Dr Lita Mains notified

## 2016-12-14 NOTE — ED Notes (Signed)
Admitting Provider at bedside. Dr Truman Hayward

## 2016-12-14 NOTE — ED Notes (Signed)
Pt placed on 2.5L [place] and sats up to 92%. Denies home O2 use.

## 2016-12-14 NOTE — ED Notes (Signed)
MD Eulis Foster given EKG and notified of pt.

## 2016-12-14 NOTE — Progress Notes (Signed)
Pharmacy Note:  Initial antibiotics for Vancomycin and Zosyn ordered by EDP for sepsis.  Estimated Creatinine Clearance: 43.4 mL/min (A) (by C-G formula based on SCr of 1.29 mg/dL (H)).   Allergies  Allergen Reactions  . Codeine Anaphylaxis    REACTION: caused "cramping in hands" and hyperventilation.    Vitals:   12/14/16 2000 12/14/16 2030  BP: (!) 111/44 105/75  Pulse: 62 64  Resp: 16 19  Temp:      Anti-infectives    Start     Dose/Rate Route Frequency Ordered Stop   12/14/16 1900  vancomycin (VANCOCIN) IVPB 1000 mg/200 mL premix     1,000 mg 200 mL/hr over 60 Minutes Intravenous Every 1 hr x 2 12/14/16 1850 12/14/16 2125   12/14/16 1845  piperacillin-tazobactam (ZOSYN) IVPB 3.375 g     3.375 g 100 mL/hr over 30 Minutes Intravenous  Once 12/14/16 1843 12/14/16 2024   12/14/16 1845  vancomycin (VANCOCIN) IVPB 1000 mg/200 mL premix  Status:  Discontinued     1,000 mg 200 mL/hr over 60 Minutes Intravenous  Once 12/14/16 1843 12/14/16 1850      Plan: Initial doses of Vancomycin 2000mg  X 1 and zosyn 3.375gm x1  ordered. F/U admission orders for further dosing if therapy continued.  Cristy Friedlander, [facility] 12/14/2016 8:38 PM

## 2016-12-15 DIAGNOSIS — N183 Chronic kidney disease, stage 3 unspecified: Secondary | ICD-10-CM

## 2016-12-15 DIAGNOSIS — D696 Thrombocytopenia, unspecified: Secondary | ICD-10-CM

## 2016-12-15 DIAGNOSIS — A419 Sepsis, unspecified organism: Principal | ICD-10-CM

## 2016-12-15 DIAGNOSIS — G9341 Metabolic encephalopathy: Secondary | ICD-10-CM

## 2016-12-15 DIAGNOSIS — J9601 Acute respiratory failure with hypoxia: Secondary | ICD-10-CM

## 2016-12-15 DIAGNOSIS — J181 Lobar pneumonia, unspecified organism: Secondary | ICD-10-CM

## 2016-12-15 DIAGNOSIS — I10 Essential (primary) hypertension: Secondary | ICD-10-CM

## 2016-12-15 LAB — COMPREHENSIVE METABOLIC PANEL
ALBUMIN: 2.9 g/dL — AB (ref 3.5–5.0)
ALK PHOS: 45 U/L (ref 38–126)
ALT: 37 U/L (ref 14–54)
ANION GAP: 7 (ref 5–15)
AST: 52 U/L — ABNORMAL HIGH (ref 15–41)
BILIRUBIN TOTAL: 0.9 mg/dL (ref 0.3–1.2)
BUN: 25 mg/dL — ABNORMAL HIGH (ref 6–20)
CALCIUM: 7.7 mg/dL — AB (ref 8.9–10.3)
CO2: 25 mmol/L (ref 22–32)
CREATININE: 1.28 mg/dL — AB (ref 0.44–1.00)
Chloride: 102 mmol/L (ref 101–111)
GFR calc Af Amer: 48 mL/min — ABNORMAL LOW (ref >60)
GFR calc non Af Amer: 42 mL/min — ABNORMAL LOW (ref >60)
Glucose, Bld: 98 mg/dL (ref 65–99)
Potassium: 3.5 mmol/L (ref 3.5–5.1)
Sodium: 134 mmol/L — ABNORMAL LOW (ref 135–145)
TOTAL PROTEIN: 7.9 g/dL (ref 6.5–8.1)

## 2016-12-15 LAB — APTT: APTT: 36 s (ref 24–36)

## 2016-12-15 LAB — CBC
HCT: 30.2 % — ABNORMAL LOW (ref 36.0–46.0)
HEMOGLOBIN: 9.7 g/dL — AB (ref 12.0–15.0)
MCH: 31.1 pg (ref 26.0–34.0)
MCHC: 32.1 g/dL (ref 30.0–36.0)
MCV: 96.8 fL (ref 78.0–100.0)
PLATELETS: 142 10*3/uL — AB (ref 150–400)
RBC: 3.12 MIL/uL — ABNORMAL LOW (ref 3.87–5.11)
RDW: 16.3 % — AB (ref 11.5–15.5)
WBC: 8.5 10*3/uL (ref 4.0–10.5)

## 2016-12-15 LAB — TSH: TSH: 0.124 u[IU]/mL — ABNORMAL LOW (ref 0.350–4.500)

## 2016-12-15 LAB — LIPASE, BLOOD: Lipase: 30 U/L (ref 11–51)

## 2016-12-15 LAB — PROTIME-INR
INR: 1.51
Prothrombin Time: 18.4 seconds — ABNORMAL HIGH (ref 11.4–15.2)

## 2016-12-15 LAB — GLUCOSE, CAPILLARY
GLUCOSE-CAPILLARY: 128 mg/dL — AB (ref 65–99)
GLUCOSE-CAPILLARY: 160 mg/dL — AB (ref 65–99)
Glucose-Capillary: 151 mg/dL — ABNORMAL HIGH (ref 65–99)
Glucose-Capillary: 145 mg/dL — ABNORMAL HIGH (ref 65–99)

## 2016-12-15 LAB — FIBRINOGEN: Fibrinogen: 490 mg/dL — ABNORMAL HIGH (ref 210–475)

## 2016-12-15 LAB — T4, FREE: FREE T4: 1.27 ng/dL — AB (ref 0.61–1.12)

## 2016-12-15 LAB — PROCALCITONIN: Procalcitonin: 8.07 ng/mL

## 2016-12-15 LAB — VITAMIN B12: Vitamin B-12: 211 pg/mL (ref 180–914)

## 2016-12-15 MED ORDER — DOPAMINE-DEXTROSE 3.2-5 MG/ML-% IV SOLN
0.0000 ug/kg/min | INTRAVENOUS | Status: DC
  Administered 2016-12-15: 5 ug/kg/min via INTRAVENOUS
  Administered 2016-12-15: 15 ug/kg/min via INTRAVENOUS
  Administered 2016-12-16: 20 ug/kg/min via INTRAVENOUS
  Filled 2016-12-15 (×4): qty 250

## 2016-12-15 MED ORDER — ORAL CARE MOUTH RINSE
15.0000 mL | Freq: Two times a day (BID) | OROMUCOSAL | Status: DC
  Administered 2016-12-15 – 2016-12-20 (×6): 15 mL via OROMUCOSAL

## 2016-12-15 MED ORDER — VANCOMYCIN HCL IN DEXTROSE 750-5 MG/150ML-% IV SOLN
750.0000 mg | Freq: Two times a day (BID) | INTRAVENOUS | Status: DC
  Administered 2016-12-15 – 2016-12-16 (×3): 750 mg via INTRAVENOUS
  Filled 2016-12-15 (×5): qty 150

## 2016-12-15 MED ORDER — PIPERACILLIN-TAZOBACTAM 3.375 G IVPB
3.3750 g | Freq: Three times a day (TID) | INTRAVENOUS | Status: AC
  Administered 2016-12-15 – 2016-12-21 (×16): 3.375 g via INTRAVENOUS
  Filled 2016-12-15 (×15): qty 50

## 2016-12-15 MED ORDER — IPRATROPIUM-ALBUTEROL 0.5-2.5 (3) MG/3ML IN SOLN
3.0000 mL | Freq: Four times a day (QID) | RESPIRATORY_TRACT | Status: DC
  Administered 2016-12-15 – 2016-12-17 (×10): 3 mL via RESPIRATORY_TRACT
  Filled 2016-12-15 (×10): qty 3

## 2016-12-15 MED ORDER — LEVOTHYROXINE SODIUM 100 MCG IV SOLR
50.0000 ug | Freq: Every day | INTRAVENOUS | Status: DC
  Administered 2016-12-15: 50 ug via INTRAVENOUS
  Filled 2016-12-15 (×3): qty 5

## 2016-12-15 MED ORDER — VITAMIN B-12 1000 MCG PO TABS
500.0000 ug | ORAL_TABLET | Freq: Every day | ORAL | Status: DC
  Administered 2016-12-16 – 2016-12-21 (×6): 500 ug via ORAL
  Filled 2016-12-15 (×6): qty 1

## 2016-12-15 MED ORDER — PIPERACILLIN-TAZOBACTAM 3.375 G IVPB
3.3750 g | Freq: Once | INTRAVENOUS | Status: AC
  Administered 2016-12-15: 3.375 g via INTRAVENOUS
  Filled 2016-12-15: qty 50

## 2016-12-15 MED ORDER — HYDROCODONE-HOMATROPINE 5-1.5 MG/5ML PO SYRP
5.0000 mL | ORAL_SOLUTION | ORAL | Status: DC | PRN
  Administered 2016-12-16 – 2016-12-19 (×6): 5 mL via ORAL
  Filled 2016-12-15 (×6): qty 5

## 2016-12-15 MED ORDER — LEVOTHYROXINE SODIUM 100 MCG IV SOLR
44.0000 ug | Freq: Every day | INTRAVENOUS | Status: DC
  Administered 2016-12-16 – 2016-12-20 (×5): 44 ug via INTRAVENOUS
  Filled 2016-12-15 (×7): qty 5

## 2016-12-15 MED ORDER — PANTOPRAZOLE SODIUM 40 MG IV SOLR
40.0000 mg | INTRAVENOUS | Status: DC
  Administered 2016-12-15 – 2016-12-21 (×7): 40 mg via INTRAVENOUS
  Filled 2016-12-15 (×7): qty 40

## 2016-12-15 NOTE — Progress Notes (Signed)
Pharmacy Antibiotic Note  DENAISHA SWANGO is a 71 y.o. female admitted on 12/14/2016 with sepsis.  Pharmacy has been consulted for Vancomycin and Zosyn dosing.  Plan: Vancomycin 750mg  IV every 12 hours.  Goal trough 15-20 mcg/mL. Zosyn 3.375g IV q8h (4 hour infusion).  Azithromycin 500mg  IV every 24 hours (per MD). Monitor labs, micro and vitals.   Height: 5\' 1"  (154.9 cm) Weight: 210 lb 1.6 oz (95.3 kg) IBW/kg (Calculated) : 47.8  Temp (24hrs), Avg:101 F (38.3 C), Min:98.6 F (37 C), Max:102.9 F (39.4 C)   Recent Labs Lab 12/14/16 1828 12/14/16 1901 12/14/16 1953 12/14/16 2240 12/15/16 0617  WBC 10.0  --   --   --  8.5  CREATININE 1.29*  --   --   --  1.28*  LATICACIDVEN  --  2.69* 1.7 3.3*  --     Estimated Creatinine Clearance: 43.7 mL/min (A) (by C-G formula based on SCr of 1.28 mg/dL (H)).    Allergies  Allergen Reactions  . Codeine Anaphylaxis    REACTION: caused "cramping in hands" and hyperventilation.    Antimicrobials this admission: Vanc 7/17 >>  Zosyn 7/17 >>  Azithromycin 7/17 >>  Dose adjustments this admission: n/a   Microbiology results: 7/17 BCx: pending  UCx:    Sputum:   7/17 MRSA PCR: (-)  Thank you for allowing pharmacy to be a part of this patient's care.  Pricilla Larsson 12/15/2016 7:52 AM

## 2016-12-15 NOTE — Plan of Care (Signed)
Problem: Safety: Goal: Ability to remain free from injury will improve Outcome: Progressing Bed in low position, side rails up, call bell and personal items with in reach   

## 2016-12-15 NOTE — Plan of Care (Signed)
Problem: Tissue Perfusion: Goal: Risk factors for ineffective tissue perfusion will decrease Outcome: Progressing Pt prescribed sq heparin while during hospital stay.
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
## 2016-12-16 ENCOUNTER — Inpatient Hospital Stay (HOSPITAL_COMMUNITY): Payer: Medicare Other

## 2016-12-16 LAB — CBC
HCT: 38.2 % (ref 36.0–46.0)
Hemoglobin: 12.5 g/dL (ref 12.0–15.0)
MCH: 31.3 pg (ref 26.0–34.0)
MCHC: 32.7 g/dL (ref 30.0–36.0)
MCV: 95.5 fL (ref 78.0–100.0)
PLATELETS: 163 10*3/uL (ref 150–400)
RBC: 4 MIL/uL (ref 3.87–5.11)
RDW: 15.6 % — AB (ref 11.5–15.5)
WBC: 8.2 10*3/uL (ref 4.0–10.5)

## 2016-12-16 LAB — PROCALCITONIN: PROCALCITONIN: 5.66 ng/mL

## 2016-12-16 LAB — CORTISOL-AM, BLOOD: CORTISOL - AM: 13 ug/dL (ref 6.7–22.6)

## 2016-12-16 LAB — BASIC METABOLIC PANEL
ANION GAP: 8 (ref 5–15)
BUN: 20 mg/dL (ref 6–20)
CO2: 26 mmol/L (ref 22–32)
Calcium: 8.4 mg/dL — ABNORMAL LOW (ref 8.9–10.3)
Chloride: 101 mmol/L (ref 101–111)
Creatinine, Ser: 1.08 mg/dL — ABNORMAL HIGH (ref 0.44–1.00)
GFR, EST AFRICAN AMERICAN: 59 mL/min — AB (ref >60)
GFR, EST NON AFRICAN AMERICAN: 51 mL/min — AB (ref >60)
Glucose, Bld: 166 mg/dL — ABNORMAL HIGH (ref 65–99)
POTASSIUM: 3.5 mmol/L (ref 3.5–5.1)
SODIUM: 135 mmol/L (ref 135–145)

## 2016-12-16 LAB — GLUCOSE, CAPILLARY
GLUCOSE-CAPILLARY: 133 mg/dL — AB (ref 65–99)
Glucose-Capillary: 158 mg/dL — ABNORMAL HIGH (ref 65–99)
Glucose-Capillary: 228 mg/dL — ABNORMAL HIGH (ref 65–99)
Glucose-Capillary: 134 mg/dL — ABNORMAL HIGH (ref 65–99)

## 2016-12-16 LAB — HEMOGLOBIN A1C
Hgb A1c MFr Bld: 6.1 % — ABNORMAL HIGH (ref 4.8–5.6)
MEAN PLASMA GLUCOSE: 128 mg/dL

## 2016-12-16 MED ORDER — NOREPINEPHRINE BITARTRATE 1 MG/ML IV SOLN
INTRAVENOUS | Status: AC
  Filled 2016-12-16: qty 4

## 2016-12-16 MED ORDER — NOREPINEPHRINE BITARTRATE 1 MG/ML IV SOLN
0.0000 ug/min | INTRAVENOUS | Status: DC
  Administered 2016-12-16 – 2016-12-17 (×2): 10 ug/min via INTRAVENOUS
  Administered 2016-12-17 (×2): 7.5 ug/min via INTRAVENOUS
  Filled 2016-12-16 (×3): qty 4
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
## 2016-12-16 NOTE — Progress Notes (Signed)
Vascular wellness PICC RN to be here at approx 1945.

## 2016-12-16 NOTE — Progress Notes (Signed)
Order received for PICC placement. Call placed to vascular access. Someone to call back with ETA for PICC RN. Will continue to monitor.

## 2016-12-17 ENCOUNTER — Inpatient Hospital Stay (HOSPITAL_COMMUNITY): Payer: Medicare Other

## 2016-12-17 DIAGNOSIS — I340 Nonrheumatic mitral (valve) insufficiency: Secondary | ICD-10-CM

## 2016-12-17 DIAGNOSIS — H548 Legal blindness, as defined in USA: Secondary | ICD-10-CM

## 2016-12-17 LAB — COMPREHENSIVE METABOLIC PANEL
ALT: 25 U/L (ref 14–54)
AST: 30 U/L (ref 15–41)
Albumin: 2.8 g/dL — ABNORMAL LOW (ref 3.5–5.0)
Alkaline Phosphatase: 46 U/L (ref 38–126)
Anion gap: 7 (ref 5–15)
BUN: 12 mg/dL (ref 6–20)
CHLORIDE: 102 mmol/L (ref 101–111)
CO2: 28 mmol/L (ref 22–32)
CREATININE: 0.87 mg/dL (ref 0.44–1.00)
Calcium: 8.2 mg/dL — ABNORMAL LOW (ref 8.9–10.3)
GFR calc Af Amer: >60 mL/min (ref >60)
GFR calc non Af Amer: >60 mL/min (ref >60)
GLUCOSE: 154 mg/dL — AB (ref 65–99)
Potassium: 2.8 mmol/L — ABNORMAL LOW (ref 3.5–5.1)
SODIUM: 137 mmol/L (ref 135–145)
Total Bilirubin: 0.9 mg/dL (ref 0.3–1.2)
Total Protein: 8.5 g/dL — ABNORMAL HIGH (ref 6.5–8.1)

## 2016-12-17 LAB — ECHOCARDIOGRAM COMPLETE
CHL CUP DOP CALC LVOT VTI: 25.5 cm
E decel time: 222 msec
E/e' ratio: 9.69
FS: 34 % (ref 28–44)
Height: 61 in
IV/PV OW: 0.98
LADIAMINDEX: 1.94 cm/m2
LASIZE: 40 mm
LAVOL: 71.8 mL
LAVOLA4C: 72.5 mL
LAVOLIN: 34.9 mL/m2
LDCA: 2.01 cm2
LEFT ATRIUM END SYS DIAM: 40 mm
LV E/e' medial: 9.69
LV E/e'average: 9.69
LV PW d: 11 mm — AB (ref 0.6–1.1)
LV SIMPSON'S DISK: 62
LV TDI E'LATERAL: 12.7
LV dias vol index: 36 mL/m2
LV dias vol: 75 mL (ref 46–106)
LV sys vol: 28 mL
LVELAT: 12.7 cm/s
LVOT SV: 51 mL
LVOT peak grad rest: 6 mmHg
LVOT peak vel: 121 cm/s
LVOTD: 16 mm
LVSYSVOLIN: 14 mL/m2
Lateral S' vel: 14.1 cm/s
MV Dec: 222
MVPG: 6 mmHg
MVPKAVEL: 99 m/s
MVPKEVEL: 123 m/s
RV sys press: 43 mmHg
Reg peak vel: 296 cm/s
Stroke v: 47 ml
TAPSE: 25.8 mm
TDI e' medial: 9.57
TRMAXVEL: 296 cm/s
Weight: 3305.14 oz

## 2016-12-17 LAB — GLUCOSE, CAPILLARY
Glucose-Capillary: 138 mg/dL — ABNORMAL HIGH (ref 65–99)
Glucose-Capillary: 165 mg/dL — ABNORMAL HIGH (ref 65–99)
Glucose-Capillary: 219 mg/dL — ABNORMAL HIGH (ref 65–99)
Glucose-Capillary: 184 mg/dL — ABNORMAL HIGH (ref 65–99)

## 2016-12-17 LAB — BRAIN NATRIURETIC PEPTIDE: B Natriuretic Peptide: 258 pg/mL — ABNORMAL HIGH (ref 0.0–100.0)

## 2016-12-17 LAB — CBC
HCT: 30.1 % — ABNORMAL LOW (ref 36.0–46.0)
Hemoglobin: 9.7 g/dL — ABNORMAL LOW (ref 12.0–15.0)
MCH: 30.8 pg (ref 26.0–34.0)
MCHC: 32.2 g/dL (ref 30.0–36.0)
MCV: 95.6 fL (ref 78.0–100.0)
PLATELETS: 168 10*3/uL (ref 150–400)
RBC: 3.15 MIL/uL — ABNORMAL LOW (ref 3.87–5.11)
RDW: 15.8 % — AB (ref 11.5–15.5)
WBC: 6.7 10*3/uL (ref 4.0–10.5)

## 2016-12-17 LAB — PROCALCITONIN: PROCALCITONIN: 3.04 ng/mL

## 2016-12-17 LAB — CORTISOL-AM, BLOOD: Cortisol - AM: 2.7 ug/dL — ABNORMAL LOW (ref 6.7–22.6)

## 2016-12-17 MED ORDER — NOREPINEPHRINE BITARTRATE 1 MG/ML IV SOLN
INTRAVENOUS | Status: AC
  Filled 2016-12-17: qty 4

## 2016-12-17 MED ORDER — POTASSIUM CHLORIDE 10 MEQ/100ML IV SOLN
10.0000 meq | INTRAVENOUS | Status: AC
  Administered 2016-12-17 (×3): 10 meq via INTRAVENOUS
  Filled 2016-12-17 (×3): qty 100

## 2016-12-17 MED ORDER — POTASSIUM CHLORIDE CRYS ER 20 MEQ PO TBCR
40.0000 meq | EXTENDED_RELEASE_TABLET | Freq: Once | ORAL | Status: AC
  Administered 2016-12-17: 40 meq via ORAL
  Filled 2016-12-17: qty 2

## 2016-12-17 MED ORDER — HYDROCORTISONE NA SUCCINATE PF 100 MG IJ SOLR
50.0000 mg | Freq: Three times a day (TID) | INTRAMUSCULAR | Status: DC
  Administered 2016-12-17 – 2016-12-18 (×3): 50 mg via INTRAVENOUS
  Filled 2016-12-17 (×3): qty 2

## 2016-12-17 MED ORDER — IPRATROPIUM-ALBUTEROL 0.5-2.5 (3) MG/3ML IN SOLN
3.0000 mL | Freq: Three times a day (TID) | RESPIRATORY_TRACT | Status: DC
  Administered 2016-12-18 – 2016-12-21 (×10): 3 mL via RESPIRATORY_TRACT
  Filled 2016-12-17 (×11): qty 3

## 2016-12-17 NOTE — Progress Notes (Signed)
*  PRELIMINARY RESULTS* Echocardiogram 2D Echocardiogram has been performed.  Samuel Germany 12/17/2016, 4:25 PM
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
## 2016-12-18 DIAGNOSIS — J9600 Acute respiratory failure, unspecified whether with hypoxia or hypercapnia: Secondary | ICD-10-CM

## 2016-12-18 DIAGNOSIS — E2740 Unspecified adrenocortical insufficiency: Secondary | ICD-10-CM

## 2016-12-18 DIAGNOSIS — J13 Pneumonia due to Streptococcus pneumoniae: Secondary | ICD-10-CM

## 2016-12-18 LAB — BASIC METABOLIC PANEL
ANION GAP: 6 (ref 5–15)
BUN: 15 mg/dL (ref 6–20)
CALCIUM: 8.6 mg/dL — AB (ref 8.9–10.3)
CO2: 28 mmol/L (ref 22–32)
Chloride: 107 mmol/L (ref 101–111)
Creatinine, Ser: 0.82 mg/dL (ref 0.44–1.00)
GFR calc Af Amer: >60 mL/min (ref >60)
GLUCOSE: 168 mg/dL — AB (ref 65–99)
POTASSIUM: 4.2 mmol/L (ref 3.5–5.1)
Sodium: 141 mmol/L (ref 135–145)

## 2016-12-18 LAB — CBC
HEMATOCRIT: 30.5 % — AB (ref 36.0–46.0)
HEMOGLOBIN: 9.8 g/dL — AB (ref 12.0–15.0)
MCH: 30.8 pg (ref 26.0–34.0)
MCHC: 32.1 g/dL (ref 30.0–36.0)
MCV: 95.9 fL (ref 78.0–100.0)
Platelets: 172 10*3/uL (ref 150–400)
RBC: 3.18 MIL/uL — ABNORMAL LOW (ref 3.87–5.11)
RDW: 15.7 % — AB (ref 11.5–15.5)
WBC: 4.4 10*3/uL (ref 4.0–10.5)

## 2016-12-18 LAB — GLUCOSE, CAPILLARY
GLUCOSE-CAPILLARY: 153 mg/dL — AB (ref 65–99)
Glucose-Capillary: 150 mg/dL — ABNORMAL HIGH (ref 65–99)
Glucose-Capillary: 160 mg/dL — ABNORMAL HIGH (ref 65–99)
Glucose-Capillary: 185 mg/dL — ABNORMAL HIGH (ref 65–99)

## 2016-12-18 LAB — MAGNESIUM: Magnesium: 2 mg/dL (ref 1.7–2.4)

## 2016-12-18 MED ORDER — FUROSEMIDE 10 MG/ML IJ SOLN
40.0000 mg | Freq: Once | INTRAMUSCULAR | Status: AC
  Administered 2016-12-18: 40 mg via INTRAVENOUS
  Filled 2016-12-18: qty 4

## 2016-12-18 MED ORDER — HYDROCORTISONE NA SUCCINATE PF 100 MG IJ SOLR
100.0000 mg | Freq: Three times a day (TID) | INTRAMUSCULAR | Status: DC
  Administered 2016-12-18 – 2016-12-19 (×3): 100 mg via INTRAVENOUS
  Filled 2016-12-18 (×3): qty 2

## 2016-12-18 NOTE — Progress Notes (Signed)
Pharmacy Antibiotic Note  [name] is a 72 y.o. female admitted on 12/14/2016 with sepsis.  Pharmacy has been consulted for Zosyn dosing.  Plan: Cont zosyn 3.375 gm IV q8 hours  Azithromycin 500mg  IV every 24 hours (per MD). Monitor labs, micro and vitals.   Height: 5\' 1"  (154.9 cm) Weight: 214 lb 11.7 oz (97.4 kg) IBW/kg (Calculated) : 47.8  Temp (24hrs), Avg:97.8 F (36.6 C), Min:97.4 F (36.3 C), Max:98.1 F (36.7 C)   Recent Labs Lab 12/14/16 1828 12/14/16 1901 12/14/16 1953 12/14/16 2240 12/15/16 0617 12/16/16 0602 12/16/16 1424 12/17/16 0604 12/18/16 0416  WBC 10.0  --   --   --  8.5  --  8.2 6.7 4.4  CREATININE 1.29*  --   --   --  1.28* 1.08*  --  0.87 0.82  LATICACIDVEN  --  2.69* 1.7 3.3*  --   --   --   --   --     Estimated Creatinine Clearance: 69.1 mL/min (by C-G formula based on SCr of 0.82 mg/dL).    Allergies  Allergen Reactions  . Codeine Anaphylaxis    REACTION: caused "cramping in hands" and hyperventilation.    Antimicrobials this admission: Vanc 7/17 >> 7/19 Zosyn 7/17 >>  Azithromycin 7/17 >>  Dose adjustments this admission: n/a   Microbiology results: 7/17 BCx: pending  UCx:  Ng final  7/17 MRSA PCR: (-)  Thank you for allowing pharmacy to be a part of this patient's care.  Excell Seltzer [place] 12/18/2016 10:17 AM

## 2016-12-18 NOTE — Progress Notes (Signed)
Blood pressure had been maintaining well above ordered parameters so attempted to turn the levophed off at 2049, a little less than an hour later the patient's BP dropped. Turned levophed back on at the previous 46mcg/min and it came back up to the 784O systolic shortly after. Will attempt to reduce by half in a few hours and see if the patient can maintain with that amount.
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
## 2016-12-19 LAB — CBC
HEMATOCRIT: 28.7 % — AB (ref 36.0–46.0)
HEMOGLOBIN: 9.2 g/dL — AB (ref 12.0–15.0)
MCH: 30.7 pg (ref 26.0–34.0)
MCHC: 32.1 g/dL (ref 30.0–36.0)
MCV: 95.7 fL (ref 78.0–100.0)
Platelets: 170 10*3/uL (ref 150–400)
RBC: 3 MIL/uL — ABNORMAL LOW (ref 3.87–5.11)
RDW: 15.7 % — AB (ref 11.5–15.5)
WBC: 3.7 10*3/uL — AB (ref 4.0–10.5)

## 2016-12-19 LAB — CULTURE, BLOOD (ROUTINE X 2)
CULTURE: NO GROWTH
Culture: NO GROWTH

## 2016-12-19 LAB — GLUCOSE, CAPILLARY
GLUCOSE-CAPILLARY: 144 mg/dL — AB (ref 65–99)
GLUCOSE-CAPILLARY: 162 mg/dL — AB (ref 65–99)
Glucose-Capillary: 192 mg/dL — ABNORMAL HIGH (ref 65–99)
Glucose-Capillary: 157 mg/dL — ABNORMAL HIGH (ref 65–99)

## 2016-12-19 LAB — BASIC METABOLIC PANEL
ANION GAP: 7 (ref 5–15)
BUN: 20 mg/dL (ref 6–20)
CO2: 30 mmol/L (ref 22–32)
Calcium: 8.7 mg/dL — ABNORMAL LOW (ref 8.9–10.3)
Chloride: 105 mmol/L (ref 101–111)
Creatinine, Ser: 0.86 mg/dL (ref 0.44–1.00)
GFR calc Af Amer: >60 mL/min (ref >60)
Glucose, Bld: 159 mg/dL — ABNORMAL HIGH (ref 65–99)
POTASSIUM: 3.7 mmol/L (ref 3.5–5.1)
SODIUM: 142 mmol/L (ref 135–145)

## 2016-12-19 LAB — MAGNESIUM: Magnesium: 1.9 mg/dL (ref 1.7–2.4)

## 2016-12-19 MED ORDER — FUROSEMIDE 10 MG/ML IJ SOLN
40.0000 mg | Freq: Once | INTRAMUSCULAR | Status: AC
  Administered 2016-12-19: 40 mg via INTRAVENOUS
  Filled 2016-12-19: qty 4

## 2016-12-19 MED ORDER — HYDROCORTISONE NA SUCCINATE PF 100 MG IJ SOLR
50.0000 mg | Freq: Three times a day (TID) | INTRAMUSCULAR | Status: DC
  Administered 2016-12-19 – 2016-12-20 (×4): 50 mg via INTRAVENOUS
  Filled 2016-12-19 (×4): qty 2
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
## 2016-12-20 ENCOUNTER — Inpatient Hospital Stay (HOSPITAL_COMMUNITY): Payer: Medicare Other

## 2016-12-20 DIAGNOSIS — I5031 Acute diastolic (congestive) heart failure: Secondary | ICD-10-CM

## 2016-12-20 LAB — GLUCOSE, CAPILLARY
GLUCOSE-CAPILLARY: 196 mg/dL — AB (ref 65–99)
Glucose-Capillary: 160 mg/dL — ABNORMAL HIGH (ref 65–99)
Glucose-Capillary: 212 mg/dL — ABNORMAL HIGH (ref 65–99)
Glucose-Capillary: 135 mg/dL — ABNORMAL HIGH (ref 65–99)

## 2016-12-20 LAB — BASIC METABOLIC PANEL
Anion gap: 8 (ref 5–15)
BUN: 24 mg/dL — AB (ref 6–20)
CHLORIDE: 99 mmol/L — AB (ref 101–111)
CO2: 31 mmol/L (ref 22–32)
CREATININE: 0.93 mg/dL (ref 0.44–1.00)
Calcium: 8.3 mg/dL — ABNORMAL LOW (ref 8.9–10.3)
GFR calc Af Amer: >60 mL/min (ref >60)
Glucose, Bld: 144 mg/dL — ABNORMAL HIGH (ref 65–99)
Potassium: 3 mmol/L — ABNORMAL LOW (ref 3.5–5.1)
SODIUM: 138 mmol/L (ref 135–145)

## 2016-12-20 LAB — MAGNESIUM: MAGNESIUM: 1.9 mg/dL (ref 1.7–2.4)

## 2016-12-20 MED ORDER — METOPROLOL SUCCINATE ER 25 MG PO TB24
25.0000 mg | ORAL_TABLET | Freq: Every day | ORAL | Status: DC
  Administered 2016-12-20 – 2016-12-21 (×2): 25 mg via ORAL
  Filled 2016-12-20 (×2): qty 1

## 2016-12-20 MED ORDER — CHLORHEXIDINE GLUCONATE CLOTH 2 % EX PADS: 6.0000 | MEDICATED_PAD | Freq: Every day | CUTANEOUS | Status: DC

## 2016-12-20 MED ORDER — SODIUM CHLORIDE 0.9% FLUSH: 10.0000 mL | INTRAVENOUS | Status: DC | PRN

## 2016-12-20 MED ORDER — FUROSEMIDE 10 MG/ML IJ SOLN
60.0000 mg | Freq: Once | INTRAMUSCULAR | Status: AC
  Administered 2016-12-20: 60 mg via INTRAVENOUS
  Filled 2016-12-20: qty 6

## 2016-12-20 MED ORDER — LEVOTHYROXINE SODIUM 88 MCG PO TABS
88.0000 ug | ORAL_TABLET | Freq: Every day | ORAL | Status: DC
  Administered 2016-12-21: 88 ug via ORAL
  Filled 2016-12-20: qty 1

## 2016-12-20 MED ORDER — FUROSEMIDE 40 MG PO TABS
40.0000 mg | ORAL_TABLET | Freq: Every day | ORAL | Status: DC
  Administered 2016-12-21: 40 mg via ORAL
  Filled 2016-12-20: qty 1

## 2016-12-20 MED ORDER — PREDNISONE 20 MG PO TABS
50.0000 mg | ORAL_TABLET | Freq: Every day | ORAL | Status: DC
  Administered 2016-12-21: 08:00:00 50 mg via ORAL
  Filled 2016-12-20: qty 1

## 2016-12-20 MED ORDER — AZITHROMYCIN 250 MG PO TABS
500.0000 mg | ORAL_TABLET | Freq: Once | ORAL | Status: AC
  Administered 2016-12-20: 500 mg via ORAL
  Filled 2016-12-20: qty 2

## 2016-12-20 MED ORDER — FUROSEMIDE 10 MG/ML IJ SOLN: 40.0000 mg | Freq: Once | INTRAMUSCULAR | Status: DC

## 2016-12-20 MED ORDER — POTASSIUM CHLORIDE CRYS ER 20 MEQ PO TBCR
40.0000 meq | EXTENDED_RELEASE_TABLET | Freq: Once | ORAL | Status: AC
  Administered 2016-12-20: 40 meq via ORAL
  Filled 2016-12-20: qty 2

## 2016-12-20 MED ORDER — SODIUM CHLORIDE 0.9% FLUSH
10.0000 mL | Freq: Two times a day (BID) | INTRAVENOUS | Status: DC
  Administered 2016-12-20 – 2016-12-21 (×3): 10 mL
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
## 2016-12-21 DIAGNOSIS — I5031 Acute diastolic (congestive) heart failure: Secondary | ICD-10-CM

## 2016-12-21 LAB — GLUCOSE, CAPILLARY
Glucose-Capillary: 112 mg/dL — ABNORMAL HIGH (ref 65–99)
Glucose-Capillary: 172 mg/dL — ABNORMAL HIGH (ref 65–99)

## 2016-12-21 LAB — BASIC METABOLIC PANEL
ANION GAP: 7 (ref 5–15)
BUN: 26 mg/dL — ABNORMAL HIGH (ref 6–20)
CALCIUM: 8.1 mg/dL — AB (ref 8.9–10.3)
CO2: 32 mmol/L (ref 22–32)
Chloride: 103 mmol/L (ref 101–111)
Creatinine, Ser: 0.98 mg/dL (ref 0.44–1.00)
GFR, EST NON AFRICAN AMERICAN: 58 mL/min — AB (ref >60)
Glucose, Bld: 123 mg/dL — ABNORMAL HIGH (ref 65–99)
Potassium: 2.4 mmol/L — CL (ref 3.5–5.1)
Sodium: 142 mmol/L (ref 135–145)

## 2016-12-21 LAB — MAGNESIUM: Magnesium: 1.8 mg/dL (ref 1.7–2.4)

## 2016-12-21 MED ORDER — POTASSIUM CHLORIDE CRYS ER 20 MEQ PO TBCR
40.0000 meq | EXTENDED_RELEASE_TABLET | Freq: Four times a day (QID) | ORAL | Status: DC
  Administered 2016-12-21: 40 meq via ORAL
  Filled 2016-12-21: qty 2

## 2016-12-21 MED ORDER — CYANOCOBALAMIN 500 MCG PO TABS: 500.0000 ug | ORAL_TABLET | Freq: Every day | ORAL | 1 refills | Status: DC

## 2016-12-21 MED ORDER — FUROSEMIDE 40 MG PO TABS: 40.0000 mg | ORAL_TABLET | Freq: Every day | ORAL | 1 refills | Status: DC

## 2016-12-21 MED ORDER — MAGNESIUM SULFATE 50 % IJ SOLN
2.0000 g | Freq: Once | INTRAVENOUS | Status: AC
  Administered 2016-12-21: 2 g via INTRAVENOUS
  Filled 2016-12-21: qty 4

## 2016-12-21 MED ORDER — LEVOTHYROXINE SODIUM 88 MCG PO TABS: 88.0000 ug | ORAL_TABLET | Freq: Every day | ORAL | 1 refills | Status: DC

## 2016-12-21 MED ORDER — LOSARTAN POTASSIUM 25 MG PO TABS: 25.0000 mg | ORAL_TABLET | Freq: Every day | ORAL | 1 refills | Status: DC

## 2016-12-21 NOTE — Care Management Note (Signed)
Case Management Note  Patient Details  Name: [name] MRN: [id number] Date of Birth: 1946/04/11    Expected Discharge Date:  12/21/16               Expected Discharge Plan:  Home/Self Care  In-House Referral:     Discharge planning Services  CM Consult  Post Acute Care Choice:  NA Choice offered to:  NA  DME Arranged:    DME Agency:     HH Arranged:    [place] Agency:     Status of Service:  Completed, signed off  If discussed at [employer] of Stay Meetings, dates discussed:    Additional Comments: Patient discharging home today.  Patient from home with significant other. No HH. No recommendation per PT. No CM needs.  Hawkin Charo, Chauncey Reading, RN 12/21/2016, 2:00 PM

## 2016-12-21 NOTE — Progress Notes (Signed)
PT AMBULATED IN HALL 160 FEET. TOLERATED WELL. O2 SAT MAINTAINED AT 95% ON ROOM AIR.
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
## 2016-12-21 NOTE — Care Management Important Message (Signed)
Important Message  Patient Details  Name: HALEI HANOVER MRN: [id number] Date of Birth: [birth year]   Medicare Important Message Given:  Yes    Lauralie Blacksher, Chauncey Reading, RN 12/21/2016, 2:04 PM

## 2016-12-21 NOTE — Progress Notes (Signed)
Patient discharge to home. All IV access removed including PICC. Told to keep dressing on for at least 12 hours. Taken to car by wheelchair.

## 2016-12-21 NOTE — Progress Notes (Signed)
K = 2.4 messaged Dr. Carles Collet

## 2017-01-16 ENCOUNTER — Other Ambulatory Visit: Payer: Self-pay | Admitting: Cardiology

## 2017-01-27 ENCOUNTER — Other Ambulatory Visit: Payer: Self-pay

## 2017-01-27 MED ORDER — ATORVASTATIN CALCIUM 40 MG PO TABS: ORAL_TABLET | ORAL | 3 refills | Status: DC

## 2017-02-07 ENCOUNTER — Other Ambulatory Visit: Payer: Self-pay | Admitting: Gastroenterology

## 2017-03-17 DIAGNOSIS — H547 Unspecified visual loss: Secondary | ICD-10-CM | POA: Diagnosis not present

## 2017-05-04 DIAGNOSIS — N644 Mastodynia: Secondary | ICD-10-CM | POA: Diagnosis not present

## 2017-05-12 ENCOUNTER — Other Ambulatory Visit (HOSPITAL_COMMUNITY): Payer: Self-pay | Admitting: Internal Medicine

## 2017-05-12 DIAGNOSIS — N644 Mastodynia: Secondary | ICD-10-CM

## 2017-05-12 DIAGNOSIS — R52 Pain, unspecified: Secondary | ICD-10-CM

## 2017-05-13 ENCOUNTER — Encounter (HOSPITAL_COMMUNITY): Payer: Self-pay | Admitting: Emergency Medicine

## 2017-05-13 ENCOUNTER — Other Ambulatory Visit: Payer: Self-pay

## 2017-05-13 ENCOUNTER — Emergency Department (HOSPITAL_COMMUNITY)
Admission: EM | Admit: 2017-05-13 | Discharge: 2017-05-13 | Disposition: A | Discharge status: Home / Self Care | Payer: Medicare Other | Attending: Emergency Medicine | Admitting: Emergency Medicine

## 2017-05-13 DIAGNOSIS — E119 Type 2 diabetes mellitus without complications: Secondary | ICD-10-CM | POA: Diagnosis not present

## 2017-05-13 DIAGNOSIS — Z7982 Long term (current) use of aspirin: Secondary | ICD-10-CM | POA: Insufficient documentation

## 2017-05-13 DIAGNOSIS — E039 Hypothyroidism, unspecified: Secondary | ICD-10-CM | POA: Diagnosis not present

## 2017-05-13 DIAGNOSIS — Z87891 Personal history of nicotine dependence: Secondary | ICD-10-CM | POA: Insufficient documentation

## 2017-05-13 DIAGNOSIS — I5031 Acute diastolic (congestive) heart failure: Secondary | ICD-10-CM | POA: Insufficient documentation

## 2017-05-13 DIAGNOSIS — N183 Chronic kidney disease, stage 3 (moderate): Secondary | ICD-10-CM | POA: Diagnosis not present

## 2017-05-13 DIAGNOSIS — N644 Mastodynia: Secondary | ICD-10-CM | POA: Diagnosis not present

## 2017-05-13 DIAGNOSIS — Z79899 Other long term (current) drug therapy: Secondary | ICD-10-CM | POA: Insufficient documentation

## 2017-05-13 DIAGNOSIS — J449 Chronic obstructive pulmonary disease, unspecified: Secondary | ICD-10-CM | POA: Insufficient documentation

## 2017-05-13 DIAGNOSIS — Z7984 Long term (current) use of oral hypoglycemic drugs: Secondary | ICD-10-CM | POA: Diagnosis not present

## 2017-05-13 DIAGNOSIS — I130 Hypertensive heart and chronic kidney disease with heart failure and stage 1 through stage 4 chronic kidney disease, or unspecified chronic kidney disease: Secondary | ICD-10-CM | POA: Diagnosis not present

## 2017-05-13 NOTE — Discharge Instructions (Signed)
Take your usual prescriptions as previously directed.  Call your regular medical doctor today to schedule a follow up appointment within the next 2 days. Call the outpatient radiology department to obtain the time/date your family doctor scheduled your mammogram.  Return to the Emergency Department immediately sooner if worsening.

## 2017-05-13 NOTE — ED Notes (Signed)
Patient stating she is ready for discharge and not waiting on paperwork or to sign discharge papers. Provider, Mcmannis, is aware and stating pt is up for discharge and appropriate to go home and needs to follow up with PCP.
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
## 2017-05-13 NOTE — ED Triage Notes (Signed)
PT c/o continued pain to right arm since July 2018 after pt says her PICC line broke while hospitalized. PT states she has seen her PCP several times for same complaint.

## 2017-05-26 ENCOUNTER — Ambulatory Visit (HOSPITAL_COMMUNITY)
Admission: RE | Admit: 2017-05-26 | Discharge: 2017-05-26 | Disposition: A | Discharge status: Home / Self Care | Payer: Medicare Other | Source: Ambulatory Visit | Attending: Internal Medicine | Admitting: Internal Medicine

## 2017-05-26 DIAGNOSIS — N644 Mastodynia: Secondary | ICD-10-CM | POA: Diagnosis not present

## 2017-05-26 DIAGNOSIS — N6323 Unspecified lump in the left breast, lower outer quadrant: Secondary | ICD-10-CM | POA: Diagnosis not present

## 2017-05-26 DIAGNOSIS — R928 Other abnormal and inconclusive findings on diagnostic imaging of breast: Secondary | ICD-10-CM | POA: Insufficient documentation

## 2017-05-26 DIAGNOSIS — N6489 Other specified disorders of breast: Secondary | ICD-10-CM | POA: Diagnosis not present

## 2017-05-26 DIAGNOSIS — R922 Inconclusive mammogram: Secondary | ICD-10-CM | POA: Diagnosis not present

## 2017-05-30 ENCOUNTER — Other Ambulatory Visit (HOSPITAL_COMMUNITY): Payer: Self-pay | Admitting: Internal Medicine

## 2017-05-30 DIAGNOSIS — N6320 Unspecified lump in the left breast, unspecified quadrant: Secondary | ICD-10-CM

## 2017-06-07 ENCOUNTER — Other Ambulatory Visit (HOSPITAL_COMMUNITY): Payer: Self-pay | Admitting: Internal Medicine

## 2017-06-07 ENCOUNTER — Ambulatory Visit (HOSPITAL_COMMUNITY)
Admission: RE | Admit: 2017-06-07 | Discharge: 2017-06-07 | Disposition: A | Discharge status: Home / Self Care | Payer: Medicare Other | Source: Ambulatory Visit | Attending: Internal Medicine | Admitting: Internal Medicine

## 2017-06-07 DIAGNOSIS — C50512 Malignant neoplasm of lower-outer quadrant of left female breast: Secondary | ICD-10-CM | POA: Diagnosis not present

## 2017-06-07 DIAGNOSIS — N6320 Unspecified lump in the left breast, unspecified quadrant: Secondary | ICD-10-CM | POA: Diagnosis present

## 2017-06-07 DIAGNOSIS — N6323 Unspecified lump in the left breast, lower outer quadrant: Secondary | ICD-10-CM | POA: Diagnosis not present

## 2017-06-07 MED ORDER — LIDOCAINE-EPINEPHRINE (PF) 1 %-1:200000 IJ SOLN
INTRAMUSCULAR | Status: AC
  Administered 2017-06-07: 7 mL
  Filled 2017-06-07: qty 30

## 2017-06-07 MED ORDER — SODIUM BICARBONATE 4 % IV SOLN
INTRAVENOUS | Status: AC
  Administered 2017-06-07: 2 mL
  Filled 2017-06-07: qty 5

## 2017-06-07 MED ORDER — LIDOCAINE HCL (PF) 1 % IJ SOLN
INTRAMUSCULAR | Status: AC
  Administered 2017-06-07: 5 mL
  Filled 2017-06-07: qty 5

## 2017-06-21 ENCOUNTER — Ambulatory Visit: Payer: Medicare Other | Admitting: General Surgery

## 2017-06-21 ENCOUNTER — Encounter: Payer: Self-pay | Admitting: General Surgery

## 2017-06-21 VITALS — BP 133/44 | HR 53 | Temp 97.8°F | Resp 18 | Ht 61.0 in | Wt 198.0 lb

## 2017-06-21 DIAGNOSIS — N644 Mastodynia: Secondary | ICD-10-CM | POA: Insufficient documentation

## 2017-06-21 DIAGNOSIS — C50912 Malignant neoplasm of unspecified site of left female breast: Secondary | ICD-10-CM | POA: Insufficient documentation

## 2017-06-21 DIAGNOSIS — C50512 Malignant neoplasm of lower-outer quadrant of left female breast: Secondary | ICD-10-CM | POA: Diagnosis not present
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
## 2017-06-21 NOTE — Patient Instructions (Addendum)
**Evening primrose oil and Vitamin E supplement may help with breast pain.    Surgical Options for Early-Stage Breast Cancer Surgery is usually the first treatment for early-stage breast cancer. Most women have two surgery options. One is called breast-sparing surgery and the other is called mastectomy. Both surgeries have good survival rates. Everyone's breast cancer is different, even in the early stage. The best treatment for one person might not be the best treatment for you. Learn as much as you can about your cancer and work closely with your health care providers to make the best choices for you. Breast-sparing surgery With breast-sparing surgery, your cancer is removed along with some breast tissue that surrounds it. Lymph nodes from under the arm may also be removed. There are two types of breast-sparing surgeries:  Lumpectomy. With this surgery, less breast tissue is removed than with a partial mastectomy.  Partial mastectomy. With this surgery, more breast tissue is removed than with a lumpectomy.  Here are some advantages of breast-sparing surgery:  You can keep most of your breast.  Recovery is easier than from a mastectomy.  You may be able to go home the day of the procedure.  Here are some disadvantages of breast-sparing surgery:  There is a slightly higher risk than with a mastectomy that your cancer will come back. You may need more surgery.  You will probably need to have radiation therapy after surgery. Radiation therapy is given every day for about 5 weeks. It also has side effects and possible complications.  Mastectomy Mastectomy is surgery to remove your whole breast along with the cancer cells. There are two types of mastectomies:  Simple or total mastectomy. With this surgery, your breast and some lymph nodes under your arm are removed.  Modified radical mastectomy. With this surgery, many lymph nodes from under your arm and some of the muscle layers under  your breast are removed.  Here are some advantages of mastectomy:  You will probably not need to have radiation therapy after the surgery.  There is very little chance the cancer will come back.  Here are some disadvantages of mastectomy:  The recovery is longer than the recovery from breast-sparing surgery.  There are more possible complications.  Questions to ask Here are some questions to ask about each surgery:  What will recovery be like?  How will my breast look and feel?  What are the possible risks and complications of the surgery?  What treatment might I need after surgery?  What are the risks and complications of radiation therapy?  What are the risks and complications of chemotherapy?  This information is not intended to replace advice given to you by your health care provider. Make sure you discuss any questions you have with your health care provider. Document Released: 08/07/2003 Document Revised: 10/23/2015 Document Reviewed: 04/05/2013 Elsevier Interactive Patient Education  2018 [place]   Left Breast Cancer, Female Breast cancer is an abnormal growth of tissue (tumor) in the breast that is cancerous (malignant). Unlike noncancerous (benign) tumors, malignant tumors can spread to other parts of your body. The most common type of female breast cancer begins in the milk ducts (ductal carcinoma). Breast cancer is one of the most common types of cancer in women. What are the causes? The exact cause of female breast cancer is unknown. What increases the risk?  Age older than 52 years.  Family history of breast cancer.  Having the BRCA1 and BRCA2 genes.  Personal history of radiation  exposure.  Obesity.  Menstrual periods that begin before age 42 years.  Menopause that begins after age 83 years.  Pregnant for the first time at the age of 22 years or older.  Using hormone therapy.  Drinking more than one alcoholic drink per day. What are the  signs or symptoms?  A painless lump in your breast.  Changes in the size or shape of your breast.  Breast skin changes, such as puckering or dimpling.  Nipple abnormalities, such as scaling, crustiness, redness, or pulling in (retraction).  Nipple discharge that is bloody or clear. How is this diagnosed? Your health care provider will ask about your medical history. He or she may also perform a number of procedures, such as:  A physical exam. This will involve feeling the tissue around the breast and under the arms.  Taking a sample of nipple discharge. The sample will be examined under a microscope.  Breast X-rays (mammogram), breast ultrasound exams, or an MRI.  Taking a tissue sample (biopsy) from the breast. The sample will be examined under a microscope to look for cancer cells.  Your cancer will be staged to determine its severity and extent. Staging is a careful attempt to find out the size of the tumor, whether the cancer has spread, and if so, to what parts of the body. You may need to have more tests to determine the stage of your cancer:  Stage 0-The tumor has not spread to other breast tissue.  Stage I-The cancer is only found in the breast. The tumor may be up to  in (2 cm) wide.  Stage II-The cancer has spread to nearby lymph nodes. The tumor may be up to 2 in (5 cm) wide.  Stage III-The cancer has spread to more distant lymph nodes. The tumor may be larger than 2 in (5 cm) wide.  Stage IV-The cancer has spread to other parts of the body, such as the bones, brain, liver, or lungs.  How is this treated? Depending on the type and stage, female breast cancer may be treated with one or more of the following therapies:  Surgery to remove just the tumor (lumpectomy) or the entire breast (mastectomy). Lymph nodes may also be removed.  Radiation therapy, which uses high-energy rays to kill cancer cells.  Chemotherapy, which is the use of drugs to kill cancer  cells.  Hormone therapy, which involves taking medicine to adjust the hormone levels in your body. You may take medicine to decrease your estrogen levels. This can help stop cancer cells from growing.  Follow these instructions at home:  Take medicines only as directed by your health care provider.  Maintain a healthy diet.  Consider joining a support group. This may help you learn to cope with the stress of having breast cancer.  Keep all follow-up appointments as directed by your health care provider. Contact a health care provider if:  You have a sudden increase in pain.  You notice a new lump in either breast or under your arm.  You develop swelling in either arm or hand.  You lose weight without trying.  You have a fever.  You notice new fatigue or weakness. Get help right away if:  You have chest pain or trouble breathing.  You faint. This information is not intended to replace advice given to you by your health care provider. Make sure you discuss any questions you have with your health care provider. Document Released: 08/25/2005 Document Revised: 09/25/2015 Document Reviewed: 07/11/2013  Elsevier Interactive Patient Education  2017 [employer]   Right Breast Tenderness Breast tenderness is a common problem for women of all ages. Breast tenderness may cause mild discomfort to severe pain. The pain usually comes and goes in association with your menstrual cycle, but it can be constant. Breast tenderness has many possible causes, including hormone changes and some medicines. Your health care provider may order tests, such as a mammogram or an ultrasound, to check for any unusual findings. Having breast tenderness usually does not mean that you have breast cancer. Follow these instructions at home: Sometimes, reassurance that you do not have breast cancer is all that is needed. In general, follow these home care instructions: Managing pain and discomfort  If directed,  apply ice to the area: ? Put ice in a plastic bag. ? Place a towel between your skin and the bag. ? Leave the ice on for 20 minutes, 2-3 times a day.  Make sure you are wearing a supportive bra, especially during exercise. You may also want to wear a supportive bra while sleeping if your breasts are very tender. Medicines  Take over-the-counter and prescription medicines only as told by your health care provider. If the cause of your pain is infection, you may be prescribed an antibiotic medicine.  If you were prescribed an antibiotic, take it as told by your health care provider. Do not stop taking the antibiotic even if you start to feel better. General instructions  Your health care provider may recommend that you reduce the amount of fat in your diet. You can do this by: ? Limiting fried foods. ? Cooking foods using methods, such as baking, boiling, grilling, and broiling.  Decrease the amount of caffeine in your diet. You can do this by drinking more water and choosing caffeine-free options.  Keep a log of the days and times when your breasts are most tender.  Ask your health care provider how to do breast exams at home. This will help you notice if you have an unusual growth or lump. Contact a health care provider if:  Any part of your breast is hard, red, and hot to the touch. This may be a sign of infection.  You are not breastfeeding and you have fluid, especially blood or pus, coming out of your nipples.  You have a fever.  You have a new or painful lump in your breast that remains after your menstrual period ends.  Your pain does not improve or it gets worse.  Your pain is interfering with your daily activities. This information is not intended to replace advice given to you by your health care provider. Make sure you discuss any questions you have with your health care provider. Document Released: 04/29/2008 Document Revised: 02/13/2016 Document Reviewed:  02/13/2016 Elsevier Interactive Patient Education  [employer].  It is possible that you are having pain between your ribs and not truly right breast pain.   Costochondritis Costochondritis is swelling and irritation (inflammation) of the tissue (cartilage) that connects your ribs to your breastbone (sternum). This causes pain in the front of your chest. Usually, the pain:  Starts gradually.  Is in more than one rib.  This condition usually goes away on its own over time. Follow these instructions at home:  Do not do anything that makes your pain worse.  If directed, put ice on the painful area: ? Put ice in a plastic bag. ? Place a towel between your skin and the bag. ?  Leave the ice on for 20 minutes, 2-3 times a day.  If directed, put heat on the affected area as often as told by your doctor. Use the heat source that your doctor tells you to use, such as a moist heat pack or a heating pad. ? Place a towel between your skin and the heat source. ? Leave the heat on for 20-30 minutes. ? Take off the heat if your skin turns bright red. This is very important if you cannot feel pain, heat, or cold. You may have a greater risk of getting burned.  Take over-the-counter and prescription medicines only as told by your doctor.  Return to your normal activities as told by your doctor. Ask your doctor what activities are safe for you.  Keep all follow-up visits as told by your doctor. This is important. Contact a doctor if:  You have chills or a fever.  Your pain does not go away or it gets worse.  You have a cough that does not go away. Get help right away if:  You are short of breath. This information is not intended to replace advice given to you by your health care provider. Make sure you discuss any questions you have with your health care provider. Document Released: 11/03/2007 Document Revised: 12/05/2015 Document Reviewed: 09/10/2015 Elsevier Interactive Patient  Education  [employer].

## 2017-06-30 ENCOUNTER — Encounter: Payer: Self-pay | Admitting: Cardiology

## 2017-06-30 ENCOUNTER — Ambulatory Visit: Payer: Medicare Other | Admitting: Cardiology

## 2017-06-30 VITALS — BP 120/68 | HR 52 | Ht 61.0 in | Wt 197.0 lb

## 2017-06-30 DIAGNOSIS — I10 Essential (primary) hypertension: Secondary | ICD-10-CM

## 2017-06-30 DIAGNOSIS — E782 Mixed hyperlipidemia: Secondary | ICD-10-CM | POA: Diagnosis not present

## 2017-06-30 DIAGNOSIS — Z01810 Encounter for preprocedural cardiovascular examination: Secondary | ICD-10-CM | POA: Diagnosis not present

## 2017-06-30 DIAGNOSIS — R0602 Shortness of breath: Secondary | ICD-10-CM

## 2017-06-30 DIAGNOSIS — I2510 Atherosclerotic heart disease of native coronary artery without angina pectoris: Secondary | ICD-10-CM | POA: Diagnosis not present
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
## 2017-06-30 NOTE — Patient Instructions (Signed)
Medication Instructions:  Your physician recommends that you continue on your current medications as directed. Please refer to the Current Medication list given to you today.   Labwork: none  Testing/Procedures: Your physician has requested that you have a lexiscan myoview. For further information please visit [email or web address]. Please follow instruction sheet, as given.    Follow-Up: Your physician wants you to follow-up in: 6 months .  You will receive a reminder letter in the mail two months in advance. If you don't receive a letter, please call our office to schedule the follow-up appointment.   Any Other Special Instructions Will Be Listed Below (If Applicable).     If you need a refill on your cardiac medications before your next appointment, please call your pharmacy.

## 2017-07-01 DIAGNOSIS — N644 Mastodynia: Secondary | ICD-10-CM | POA: Diagnosis not present

## 2017-07-01 DIAGNOSIS — R945 Abnormal results of liver function studies: Secondary | ICD-10-CM | POA: Diagnosis not present

## 2017-07-01 DIAGNOSIS — G629 Polyneuropathy, unspecified: Secondary | ICD-10-CM | POA: Diagnosis not present

## 2017-07-01 DIAGNOSIS — E876 Hypokalemia: Secondary | ICD-10-CM | POA: Diagnosis not present

## 2017-07-03 ENCOUNTER — Encounter: Payer: Self-pay | Admitting: Cardiology

## 2017-07-04 ENCOUNTER — Encounter (HOSPITAL_COMMUNITY)
Admission: RE | Admit: 2017-07-04 | Discharge: 2017-07-04 | Disposition: A | Discharge status: Home / Self Care | Payer: Medicare Other | Source: Ambulatory Visit | Attending: Cardiology | Admitting: Cardiology

## 2017-07-04 ENCOUNTER — Ambulatory Visit (HOSPITAL_COMMUNITY)
Admission: RE | Admit: 2017-07-04 | Discharge: 2017-07-04 | Disposition: A | Discharge status: Home / Self Care | Payer: Medicare Other | Source: Ambulatory Visit | Attending: Cardiology | Admitting: Cardiology

## 2017-07-04 ENCOUNTER — Encounter (HOSPITAL_COMMUNITY): Payer: Self-pay

## 2017-07-04 DIAGNOSIS — R0602 Shortness of breath: Secondary | ICD-10-CM | POA: Insufficient documentation

## 2017-07-04 DIAGNOSIS — R9439 Abnormal result of other cardiovascular function study: Secondary | ICD-10-CM | POA: Insufficient documentation

## 2017-07-04 HISTORY — DX: Type 2 diabetes mellitus without complications: E11.9

## 2017-07-04 LAB — NM MYOCAR MULTI W/SPECT W/WALL MOTION / EF
CHL CUP NUCLEAR SRS: 2
CSEPPHR: 62 {beats}/min
LVDIAVOL: 83 mL (ref 46–106)
LVSYSVOL: 38 mL
NUC STRESS TID: 1.06
RATE: 0.8
Rest HR: 50 {beats}/min
SDS: 4
SSS: 6

## 2017-07-04 MED ORDER — SODIUM CHLORIDE 0.9% FLUSH
INTRAVENOUS | Status: AC
  Filled 2017-07-04: qty 160

## 2017-07-04 MED ORDER — REGADENOSON 0.4 MG/5ML IV SOLN
INTRAVENOUS | Status: AC
  Administered 2017-07-04: 0.4 mg via INTRAVENOUS
  Filled 2017-07-04: qty 5

## 2017-07-04 MED ORDER — TECHNETIUM TC 99M TETROFOSMIN IV KIT
10.0000 | PACK | Freq: Once | INTRAVENOUS | Status: AC | PRN
  Administered 2017-07-04: 10 via INTRAVENOUS

## 2017-07-04 MED ORDER — SODIUM CHLORIDE 0.9% FLUSH
INTRAVENOUS | Status: AC
  Administered 2017-07-04: 10 mL via INTRAVENOUS
  Filled 2017-07-04: qty 10

## 2017-07-04 MED ORDER — TECHNETIUM TC 99M TETROFOSMIN IV KIT
30.0000 | PACK | Freq: Once | INTRAVENOUS | Status: AC | PRN
  Administered 2017-07-04: 29.6 via INTRAVENOUS

## 2017-07-12 ENCOUNTER — Telehealth: Payer: Self-pay | Admitting: Cardiology

## 2017-07-12 NOTE — Telephone Encounter (Signed)
Arnoldo Lenis, MD  Drema Dallas, CMA        Stress test shows some old damage to the bottom of the heart, and a possible small current blockage. This area is fairly small and is not considered high risk, and not something that would prevent her surgery. Recommend proceeding with her surgery as planned, please send my addended clinic note to surgeon.    Called pt , got voicemail-cc

## 2017-07-12 NOTE — Telephone Encounter (Signed)
lvm returning call

## 2017-07-13 ENCOUNTER — Telehealth: Payer: Self-pay

## 2017-07-13 NOTE — Telephone Encounter (Signed)
-----   Message from Arnoldo Lenis, MD sent at 07/06/2017  1:48 PM EST ----- Stress test shows some old damage to the bottom of the heart, and a possible small current blockage. This area is fairly small and is not considered high risk, and not something that would prevent her surgery. Recommend proceeding with her surgery as planned, please send my addended clinic note to surgeon.    Zandra Abts MD

## 2017-07-13 NOTE — Telephone Encounter (Signed)
Called pt., no answer. Left massage for pt to return my call.

## 2017-07-14 ENCOUNTER — Inpatient Hospital Stay (HOSPITAL_COMMUNITY): Payer: Medicare Other | Attending: Internal Medicine | Admitting: Internal Medicine

## 2017-07-14 ENCOUNTER — Other Ambulatory Visit: Payer: Self-pay

## 2017-07-14 ENCOUNTER — Encounter (HOSPITAL_COMMUNITY): Payer: Self-pay | Admitting: Internal Medicine

## 2017-07-14 VITALS — BP 103/34 | HR 47 | Temp 97.4°F | Resp 16 | Ht 61.0 in | Wt 196.5 lb

## 2017-07-14 DIAGNOSIS — Z170 Estrogen receptor positive status [ER+]: Secondary | ICD-10-CM | POA: Diagnosis not present

## 2017-07-14 DIAGNOSIS — C50512 Malignant neoplasm of lower-outer quadrant of left female breast: Secondary | ICD-10-CM | POA: Insufficient documentation

## 2017-07-14 DIAGNOSIS — N644 Mastodynia: Secondary | ICD-10-CM

## 2017-07-14 NOTE — Patient Instructions (Addendum)
[name] at [facility] Discharge Instructions  RECOMMENDATIONS MADE BY THE CONSULTANT AND ANY TEST RESULTS WILL BE SENT TO YOUR REFERRING PHYSICIAN.  Mammogram shows small mass in the left breast.   Nothing showed under your arm.   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   First step is going to be surgery and surgeon with go over options with you .  We will have to check certain markers on the breast to see what medicaton or treatment you will need.   Breast MRI to see if the right breast is showing anything due to so much pain in the right breast.   Follow up in 2 weeks after the MRI is done.   Thank you for choosing [place] at [facility] to provide your oncology and hematology care.  To afford each patient quality time with our provider, please arrive at least 15 minutes before your scheduled appointment time.    If you have a lab appointment with the [facility] please come in thru the  Main Entrance and check in at the main information desk  You need to re-schedule your appointment should you arrive 10 or more minutes late.  We strive to give you quality time with our providers, and arriving late affects you and other patients whose appointments are after yours.  Also, if you no show three or more times for appointments you may be dismissed from the clinic at the providers discretion.     Again, thank you for choosing [facility].  Our hope is that these requests will decrease the amount of time that you wait before being seen by our physicians.       _____________________________________________________________  Should you have questions after your visit to [facility], please contact our office at [phone] between the hours of 8:30 a.m. and 4:30 p.m.  Voicemails left after 4:30 p.m. will not be returned until the following business day.  For prescription refill requests, have your pharmacy  contact our office.       Resources For Cancer Patients and their Caregivers ? American Cancer Society: Can assist with transportation, wigs, general needs, runs Look Good Feel Better.        (770)061-4860 ? Cancer Care: Provides financial assistance, online support groups, medication/co-pay assistance.  1-800-813-HOPE (520) 551-6592) ? [facility] Assists [place] cancer patients and their families through emotional , educational and financial support.  607-404-5630 ? [place] DSS Where to apply for food stamps, Medicaid and utility assistance. 321-682-7843 ? [facility]: Transportation to medical appointments. 304-325-2925 ? Social Security Administration: May apply for disability if have a Stage IV cancer. 770-530-8060 925-449-2077 ? [employer], Disability and Transit Services: Assists with nutrition, care and transit needs. [facility] Support Programs: @10RELATIVEDAYS @ > Cancer Support Group  2nd Tuesday of the month 1pm-2pm, Journey Room  > Creative Journey  3rd Tuesday of the month 1130am-1pm, Journey Room  > Look Good Feel Better  1st Wednesday of the month 10am-12 noon, Journey Room (Call [facility] to register [phone])

## 2017-07-15 ENCOUNTER — Telehealth: Payer: Self-pay

## 2017-07-15 NOTE — Telephone Encounter (Signed)
-----   Message from Arnoldo Lenis, MD sent at 07/06/2017  1:48 PM EST ----- Stress test shows some old damage to the bottom of the heart, and a possible small current blockage. This area is fairly small and is not considered high risk, and not something that would prevent her surgery. Recommend proceeding with her surgery as planned, please send my addended clinic note to surgeon.    Zandra Abts MD

## 2017-07-15 NOTE — Telephone Encounter (Signed)
Called pt., no answer. Left message for pt to return call.  

## 2017-07-21 ENCOUNTER — Ambulatory Visit: Payer: Medicare Other | Admitting: General Surgery

## 2017-07-21 ENCOUNTER — Telehealth: Payer: Self-pay | Admitting: General Surgery

## 2017-07-21 NOTE — Telephone Encounter (Addendum)
[facility]  Patient left clinic prior to being seen. Has a breast cancer on the left and needs surgery.   Has undergone risk stratification with Dr. Harl Bowie and a stress test and is not considered high risk and should proceed with surgery.   I spoke with them on 07/22/17. They said they went to the office and made another appt. She had gotten sick and the room was hot so they left.  07/26/17 is her new appt.   Curlene Labrum, MD [facility] [address] [place], [place] [address] [phone] (office)

## 2017-07-22 ENCOUNTER — Inpatient Hospital Stay (HOSPITAL_COMMUNITY): Payer: Medicare Other

## 2017-07-22 ENCOUNTER — Ambulatory Visit (HOSPITAL_COMMUNITY): Admission: RE | Admit: 2017-07-22 | Payer: Medicare Other | Source: Ambulatory Visit

## 2017-07-22 DIAGNOSIS — C50512 Malignant neoplasm of lower-outer quadrant of left female breast: Secondary | ICD-10-CM | POA: Diagnosis not present

## 2017-07-22 DIAGNOSIS — Z170 Estrogen receptor positive status [ER+]: Principal | ICD-10-CM

## 2017-07-22 LAB — COMPREHENSIVE METABOLIC PANEL
ALT: 46 U/L (ref 14–54)
AST: 69 U/L — ABNORMAL HIGH (ref 15–41)
Albumin: 3.4 g/dL — ABNORMAL LOW (ref 3.5–5.0)
Alkaline Phosphatase: 82 U/L (ref 38–126)
Anion gap: 8 (ref 5–15)
BUN: 23 mg/dL — ABNORMAL HIGH (ref 6–20)
CHLORIDE: 103 mmol/L (ref 101–111)
CO2: 24 mmol/L (ref 22–32)
CREATININE: 1.15 mg/dL — AB (ref 0.44–1.00)
Calcium: 9.1 mg/dL (ref 8.9–10.3)
GFR calc Af Amer: 55 mL/min — ABNORMAL LOW (ref >60)
GFR, EST NON AFRICAN AMERICAN: 47 mL/min — AB (ref >60)
Glucose, Bld: 86 mg/dL (ref 65–99)
POTASSIUM: 3.7 mmol/L (ref 3.5–5.1)
Sodium: 135 mmol/L (ref 135–145)
Total Bilirubin: 0.2 mg/dL — ABNORMAL LOW (ref 0.3–1.2)
Total Protein: 10.4 g/dL — ABNORMAL HIGH (ref 6.5–8.1)

## 2017-07-22 LAB — CBC WITH DIFFERENTIAL/PLATELET
Basophils Absolute: 0 10*3/uL (ref 0.0–0.1)
Basophils Relative: 0 %
EOS ABS: 0.3 10*3/uL (ref 0.0–0.7)
EOS PCT: 6 %
HCT: 39.9 % (ref 36.0–46.0)
Hemoglobin: 12.6 g/dL (ref 12.0–15.0)
LYMPHS ABS: 2.4 10*3/uL (ref 0.7–4.0)
LYMPHS PCT: 39 %
MCH: 29.8 pg (ref 26.0–34.0)
MCHC: 31.6 g/dL (ref 30.0–36.0)
MCV: 94.3 fL (ref 78.0–100.0)
MONO ABS: 0.4 10*3/uL (ref 0.1–1.0)
MONOS PCT: 7 %
Neutro Abs: 3 10*3/uL (ref 1.7–7.7)
Neutrophils Relative %: 48 %
PLATELETS: 196 10*3/uL (ref 150–400)
RBC: 4.23 MIL/uL (ref 3.87–5.11)
RDW: 15.5 % (ref 11.5–15.5)
WBC: 6.2 10*3/uL (ref 4.0–10.5)

## 2017-07-22 LAB — LACTATE DEHYDROGENASE: LDH: 160 U/L (ref 98–192)

## 2017-07-26 ENCOUNTER — Other Ambulatory Visit: Payer: Self-pay | Admitting: General Surgery

## 2017-07-26 ENCOUNTER — Ambulatory Visit (INDEPENDENT_AMBULATORY_CARE_PROVIDER_SITE_OTHER): Payer: Medicare Other | Admitting: General Surgery

## 2017-07-26 ENCOUNTER — Encounter: Payer: Self-pay | Admitting: General Surgery

## 2017-07-26 ENCOUNTER — Telehealth: Payer: Self-pay

## 2017-07-26 VITALS — BP 103/51 | HR 53 | Temp 97.8°F | Resp 18 | Ht 61.0 in | Wt 198.0 lb

## 2017-07-26 DIAGNOSIS — Z853 Personal history of malignant neoplasm of breast: Secondary | ICD-10-CM

## 2017-07-26 DIAGNOSIS — R229 Localized swelling, mass and lump, unspecified: Principal | ICD-10-CM

## 2017-07-26 DIAGNOSIS — C50512 Malignant neoplasm of lower-outer quadrant of left female breast: Secondary | ICD-10-CM | POA: Diagnosis not present

## 2017-07-26 DIAGNOSIS — IMO0002 Reserved for concepts with insufficient information to code with codable children: Secondary | ICD-10-CM

## 2017-07-26 NOTE — Telephone Encounter (Signed)
-----   Message from Arnoldo Lenis, MD sent at 07/06/2017  1:48 PM EST ----- Stress test shows some old damage to the bottom of the heart, and a possible small current blockage. This area is fairly small and is not considered high risk, and not something that would prevent her surgery. Recommend proceeding with her surgery as planned, please send my addended clinic note to surgeon.    Zandra Abts MD

## 2017-07-26 NOTE — Telephone Encounter (Signed)
Mailed pt letter to inform her to call office.
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
## 2017-07-26 NOTE — Patient Instructions (Addendum)
Partial Mastectomy With or Without Axillary Lymph Node Removal Partial mastectomy with or without axillary lymph node removal is a surgery to remove breast cancer. It is a type of breast-conserving surgery. This means that the cancerous tissue is removed but the breast remains intact. During this procedure, the tumor and a small rim of healthy tissue surrounding it will be removed. Lymph nodes under your arm may also be removed and tested to find out if the cancer has spread. Let your health care provider know about:  Any allergies you have.  All medicines you are taking, including vitamins, herbs, eye drops, creams, and over-the-counter medicines.  Previous problems you or members of your family have had with the use of anesthetics.  Any blood disorders you have.  Any surgeries you have had.  Any medical conditions you have. What are the risks? Generally, this is a safe procedure. However, problems may occur, including:  A change in the way your breast looks and feels.  Breast pain.  Infection.  Bleeding.  Pain, swelling, weakness, or numbness in the arm on the side of your surgery.  What happens before the procedure?  Ask your health care provider about: ? Changing or stopping your regular medicines. This is especially important if you are taking diabetes medicines or blood thinners. ? Taking medicines such as aspirin and ibuprofen. These medicines can thin your blood. Do not take these medicines before your procedure if your health care provider instructs you not to.  Follow your health care provider's instructions about eating or drinking restrictions.  You may be checked for extra fluid around your lymph nodes (lymphedema). What happens during the procedure?  An IV tube will be inserted into one of your veins.  You will be given a medicine that makes you fall asleep (general anesthetic).  Your surgeon may mark your breast to indicate the location of your tumor and to  plan the incision.  Your breast will be cleaned with a germ-killing solution (antiseptic).  Your surgeon will make an incision over the area of your breast where the tumor is located. This will usually be a curved incision that follows the normal shape of your breast.  The tumor will be removed along with a portion of the tissue that surrounds it.  If the tumor is close to the muscles over your chest, some muscle tissue may also be removed.  The incision may extend to the lymph nodes under your arm, or a second incision may be made under your arm.  Lymph nodes under your arm may be removed.  You may have a drainage tube inserted into your incision to collect fluid that builds up after surgery. This tube will be connected to a suction bulb.  Your incision or incisions will be closed with stitches (sutures).  A bandage (dressing) will be placed over your breast and under your arm. The procedure may vary among health care providers and hospitals. What happens after the procedure?  Your blood pressure, heart rate, breathing rate, and blood oxygen level will be monitored often until the medicines you were given have worn off.  You will be given pain medicine as needed.  You will be encouraged to get up and walk as soon as you can.  Your IV tube will be removed when you are able to eat and drink.  Your drain may be removed before you go home from the hospital, or you may be sent home with your drain and suction bulb. This information is   not intended to replace advice given to you by your health care provider. Make sure you discuss any questions you have with your health care provider. Document Released: 10/01/2014 Document Revised: 01/22/2016 Document Reviewed: 01/30/2014 Elsevier Interactive Patient Education  2018 Elsevier Inc. Breast Cancer, Female Breast cancer is an abnormal growth of tissue (tumor) in the breast that is cancerous (malignant). Unlike noncancerous (benign) tumors,  malignant tumors can spread to other parts of your body. The most common type of female breast cancer begins in the milk ducts (ductal carcinoma). Breast cancer is one of the most common types of cancer in women. What are the causes? The exact cause of female breast cancer is unknown. What increases the risk?  Age older than 55 years.  Family history of breast cancer.  Having the BRCA1 and BRCA2 genes.  Personal history of radiation exposure.  Obesity.  Menstrual periods that begin before age 12 years.  Menopause that begins after age 55 years.  Pregnant for the first time at the age of 35 years or older.  Using hormone therapy.  Drinking more than one alcoholic drink per day. What are the signs or symptoms?  A painless lump in your breast.  Changes in the size or shape of your breast.  Breast skin changes, such as puckering or dimpling.  Nipple abnormalities, such as scaling, crustiness, redness, or pulling in (retraction).  Nipple discharge that is bloody or clear. How is this diagnosed? Your health care provider will ask about your medical history. He or she may also perform a number of procedures, such as:  A physical exam. This will involve feeling the tissue around the breast and under the arms.  Taking a sample of nipple discharge. The sample will be examined under a microscope.  Breast X-rays (mammogram), breast ultrasound exams, or an MRI.  Taking a tissue sample (biopsy) from the breast. The sample will be examined under a microscope to look for cancer cells.  Your cancer will be staged to determine its severity and extent. Staging is a careful attempt to find out the size of the tumor, whether the cancer has spread, and if so, to what parts of the body. You may need to have more tests to determine the stage of your cancer:  Stage 0-The tumor has not spread to other breast tissue.  Stage I-The cancer is only found in the breast. The tumor may be up to  in  (2 cm) wide.  Stage II-The cancer has spread to nearby lymph nodes. The tumor may be up to 2 in (5 cm) wide.  Stage III-The cancer has spread to more distant lymph nodes. The tumor may be larger than 2 in (5 cm) wide.  Stage IV-The cancer has spread to other parts of the body, such as the bones, brain, liver, or lungs.  How is this treated? Depending on the type and stage, female breast cancer may be treated with one or more of the following therapies:  Surgery to remove just the tumor (lumpectomy) or the entire breast (mastectomy). Lymph nodes may also be removed.  Radiation therapy, which uses high-energy rays to kill cancer cells.  Chemotherapy, which is the use of drugs to kill cancer cells.  Hormone therapy, which involves taking medicine to adjust the hormone levels in your body. You may take medicine to decrease your estrogen levels. This can help stop cancer cells from growing.  Follow these instructions at home:  Take medicines only as directed by your health care provider.    Maintain a healthy diet.  Consider joining a support group. This may help you learn to cope with the stress of having breast cancer.  Keep all follow-up appointments as directed by your health care provider. Contact a health care provider if:  You have a sudden increase in pain.  You notice a new lump in either breast or under your arm.  You develop swelling in either arm or hand.  You lose weight without trying.  You have a fever.  You notice new fatigue or weakness. Get help right away if:  You have chest pain or trouble breathing.  You faint. This information is not intended to replace advice given to you by your health care provider. Make sure you discuss any questions you have with your health care provider. Document Released: 08/25/2005 Document Revised: 09/25/2015 Document Reviewed: 07/11/2013 Elsevier Interactive Patient Education  2017 Elsevier Inc.  
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
## 2017-08-01 ENCOUNTER — Telehealth: Payer: Self-pay | Admitting: Cardiology

## 2017-08-01 MED ORDER — METOPROLOL SUCCINATE ER 50 MG PO TB24: ORAL_TABLET | ORAL | 3 refills | Status: DC

## 2017-08-01 NOTE — Telephone Encounter (Signed)
Pt is needing her metoprolol succinate (TOPROL-XL) 50 MG 24 hr tablet [[id number]]  Sent to [facility] on [address]

## 2017-08-05 ENCOUNTER — Ambulatory Visit (HOSPITAL_COMMUNITY): Payer: Medicare Other | Admitting: Internal Medicine

## 2017-08-12 ENCOUNTER — Encounter (HOSPITAL_COMMUNITY): Payer: Self-pay

## 2017-08-12 ENCOUNTER — Encounter (HOSPITAL_COMMUNITY)
Admission: RE | Admit: 2017-08-12 | Discharge: 2017-08-12 | Disposition: A | Discharge status: Home / Self Care | Payer: Medicare Other | Source: Ambulatory Visit | Attending: General Surgery | Admitting: General Surgery

## 2017-08-12 ENCOUNTER — Other Ambulatory Visit (HOSPITAL_COMMUNITY): Payer: Medicare Other

## 2017-08-17 ENCOUNTER — Ambulatory Visit (HOSPITAL_COMMUNITY): Payer: Medicare Other | Admitting: Anesthesiology

## 2017-08-17 ENCOUNTER — Ambulatory Visit (HOSPITAL_COMMUNITY): Admission: RE | Admit: 2017-08-17 | Payer: Medicare Other | Source: Ambulatory Visit

## 2017-08-17 ENCOUNTER — Ambulatory Visit (HOSPITAL_COMMUNITY)
Admission: RE | Admit: 2017-08-17 | Discharge: 2017-08-17 | Disposition: A | Discharge status: Home / Self Care | Payer: Medicare Other | Source: Ambulatory Visit | Attending: General Surgery | Admitting: General Surgery

## 2017-08-17 ENCOUNTER — Ambulatory Visit (HOSPITAL_COMMUNITY): Payer: Medicare Other

## 2017-08-17 ENCOUNTER — Encounter (HOSPITAL_COMMUNITY): Payer: Self-pay | Admitting: Emergency Medicine

## 2017-08-17 ENCOUNTER — Encounter (HOSPITAL_COMMUNITY)
Admission: RE | Disposition: A | Discharge status: Home / Self Care | Payer: Self-pay | Source: Ambulatory Visit | Attending: General Surgery

## 2017-08-17 DIAGNOSIS — E669 Obesity, unspecified: Secondary | ICD-10-CM | POA: Diagnosis not present

## 2017-08-17 DIAGNOSIS — Z7984 Long term (current) use of oral hypoglycemic drugs: Secondary | ICD-10-CM | POA: Insufficient documentation

## 2017-08-17 DIAGNOSIS — C50912 Malignant neoplasm of unspecified site of left female breast: Secondary | ICD-10-CM

## 2017-08-17 DIAGNOSIS — J449 Chronic obstructive pulmonary disease, unspecified: Secondary | ICD-10-CM | POA: Diagnosis not present

## 2017-08-17 DIAGNOSIS — H548 Legal blindness, as defined in USA: Secondary | ICD-10-CM | POA: Diagnosis not present

## 2017-08-17 DIAGNOSIS — Z87891 Personal history of nicotine dependence: Secondary | ICD-10-CM | POA: Diagnosis not present

## 2017-08-17 DIAGNOSIS — K589 Irritable bowel syndrome without diarrhea: Secondary | ICD-10-CM | POA: Diagnosis not present

## 2017-08-17 DIAGNOSIS — C50812 Malignant neoplasm of overlapping sites of left female breast: Secondary | ICD-10-CM | POA: Insufficient documentation

## 2017-08-17 DIAGNOSIS — I10 Essential (primary) hypertension: Secondary | ICD-10-CM | POA: Diagnosis not present

## 2017-08-17 DIAGNOSIS — Z90710 Acquired absence of both cervix and uterus: Secondary | ICD-10-CM | POA: Insufficient documentation

## 2017-08-17 DIAGNOSIS — E785 Hyperlipidemia, unspecified: Secondary | ICD-10-CM | POA: Insufficient documentation

## 2017-08-17 DIAGNOSIS — E119 Type 2 diabetes mellitus without complications: Secondary | ICD-10-CM | POA: Diagnosis not present

## 2017-08-17 DIAGNOSIS — K219 Gastro-esophageal reflux disease without esophagitis: Secondary | ICD-10-CM | POA: Diagnosis not present

## 2017-08-17 DIAGNOSIS — C50512 Malignant neoplasm of lower-outer quadrant of left female breast: Secondary | ICD-10-CM | POA: Diagnosis not present

## 2017-08-17 DIAGNOSIS — F419 Anxiety disorder, unspecified: Secondary | ICD-10-CM | POA: Insufficient documentation

## 2017-08-17 DIAGNOSIS — Z7982 Long term (current) use of aspirin: Secondary | ICD-10-CM | POA: Diagnosis not present

## 2017-08-17 DIAGNOSIS — Z79890 Hormone replacement therapy (postmenopausal): Secondary | ICD-10-CM | POA: Insufficient documentation

## 2017-08-17 DIAGNOSIS — Z853 Personal history of malignant neoplasm of breast: Secondary | ICD-10-CM

## 2017-08-17 DIAGNOSIS — N6320 Unspecified lump in the left breast, unspecified quadrant: Secondary | ICD-10-CM

## 2017-08-17 DIAGNOSIS — Z79899 Other long term (current) drug therapy: Secondary | ICD-10-CM | POA: Diagnosis not present

## 2017-08-17 DIAGNOSIS — E039 Hypothyroidism, unspecified: Secondary | ICD-10-CM | POA: Insufficient documentation

## 2017-08-17 DIAGNOSIS — R229 Localized swelling, mass and lump, unspecified: Principal | ICD-10-CM

## 2017-08-17 DIAGNOSIS — IMO0002 Reserved for concepts with insufficient information to code with codable children: Secondary | ICD-10-CM

## 2017-08-17 DIAGNOSIS — I2510 Atherosclerotic heart disease of native coronary artery without angina pectoris: Secondary | ICD-10-CM | POA: Insufficient documentation

## 2017-08-17 DIAGNOSIS — N6012 Diffuse cystic mastopathy of left breast: Secondary | ICD-10-CM | POA: Diagnosis not present

## 2017-08-17 HISTORY — PX: PARTIAL MASTECTOMY WITH NEEDLE LOCALIZATION AND AXILLARY SENTINEL LYMPH NODE BX: SHX6009

## 2017-08-17 LAB — GLUCOSE, CAPILLARY
GLUCOSE-CAPILLARY: 123 mg/dL — AB (ref 65–99)
Glucose-Capillary: 128 mg/dL — ABNORMAL HIGH (ref 65–99)

## 2017-08-17 SURGERY — PARTIAL MASTECTOMY WITH NEEDLE LOCALIZATION AND AXILLARY SENTINEL LYMPH NODE BX
Anesthesia: General | Laterality: Left

## 2017-08-17 MED ORDER — SUCCINYLCHOLINE CHLORIDE 20 MG/ML IJ SOLN
INTRAMUSCULAR | Status: AC
  Filled 2017-08-17: qty 1

## 2017-08-17 MED ORDER — BUPIVACAINE HCL (PF) 0.5 % IJ SOLN
INTRAMUSCULAR | Status: AC
  Filled 2017-08-17: qty 30

## 2017-08-17 MED ORDER — FENTANYL CITRATE (PF) 250 MCG/5ML IJ SOLN
INTRAMUSCULAR | Status: AC
Start: 2017-08-17 — End: ?
  Filled 2017-08-17: qty 5

## 2017-08-17 MED ORDER — SODIUM CHLORIDE 0.9 % IJ SOLN
INTRAMUSCULAR | Status: AC
  Filled 2017-08-17: qty 30

## 2017-08-17 MED ORDER — FENTANYL CITRATE (PF) 100 MCG/2ML IJ SOLN: 25.0000 ug | INTRAMUSCULAR | Status: DC | PRN

## 2017-08-17 MED ORDER — PROPOFOL 10 MG/ML IV BOLUS
INTRAVENOUS | Status: AC
  Filled 2017-08-17: qty 20

## 2017-08-17 MED ORDER — LIDOCAINE HCL (PF) 2 % IJ SOLN
INTRAMUSCULAR | Status: AC
  Filled 2017-08-17: qty 10

## 2017-08-17 MED ORDER — ROCURONIUM BROMIDE 50 MG/5ML IV SOLN
INTRAVENOUS | Status: AC
  Filled 2017-08-17: qty 1

## 2017-08-17 MED ORDER — ONDANSETRON HCL 4 MG/2ML IJ SOLN
INTRAMUSCULAR | Status: AC
  Filled 2017-08-17: qty 2

## 2017-08-17 MED ORDER — BUPIVACAINE LIPOSOME 1.3 % IJ SUSP
INTRAMUSCULAR | Status: AC
  Filled 2017-08-17: qty 20

## 2017-08-17 MED ORDER — PROPOFOL 10 MG/ML IV BOLUS
INTRAVENOUS | Status: AC
  Filled 2017-08-17: qty 40

## 2017-08-17 MED ORDER — CEFAZOLIN SODIUM-DEXTROSE 2-4 GM/100ML-% IV SOLN
2.0000 g | INTRAVENOUS | Status: AC
  Administered 2017-08-17: 2 g via INTRAVENOUS
  Filled 2017-08-17: qty 100

## 2017-08-17 MED ORDER — TECHNETIUM TC 99M SULFUR COLLOID FILTERED
0.5000 | Freq: Once | INTRAVENOUS | Status: AC | PRN
  Administered 2017-08-17: 0.538 via INTRADERMAL

## 2017-08-17 MED ORDER — CHLORHEXIDINE GLUCONATE CLOTH 2 % EX PADS
6.0000 | MEDICATED_PAD | Freq: Once | CUTANEOUS | Status: AC
  Administered 2017-08-17: 6 via TOPICAL

## 2017-08-17 MED ORDER — EPHEDRINE SULFATE 50 MG/ML IJ SOLN
INTRAMUSCULAR | Status: DC | PRN
  Administered 2017-08-17 (×3): 10 mg via INTRAVENOUS

## 2017-08-17 MED ORDER — GLYCOPYRROLATE 0.2 MG/ML IJ SOLN
INTRAMUSCULAR | Status: AC
  Filled 2017-08-17: qty 1

## 2017-08-17 MED ORDER — MIDAZOLAM HCL 2 MG/2ML IJ SOLN
1.0000 mg | INTRAMUSCULAR | Status: AC
  Administered 2017-08-17: 2 mg via INTRAVENOUS

## 2017-08-17 MED ORDER — 0.9 % SODIUM CHLORIDE (POUR BTL) OPTIME
TOPICAL | Status: DC | PRN
  Administered 2017-08-17: 1000 mL

## 2017-08-17 MED ORDER — PENTAFLUOROPROP-TETRAFLUOROETH EX AERO
INHALATION_SPRAY | CUTANEOUS | Status: AC
  Filled 2017-08-17: qty 103.5

## 2017-08-17 MED ORDER — FENTANYL CITRATE (PF) 100 MCG/2ML IJ SOLN
INTRAMUSCULAR | Status: DC | PRN
  Administered 2017-08-17 (×3): 50 ug via INTRAVENOUS

## 2017-08-17 MED ORDER — EPHEDRINE SULFATE 50 MG/ML IJ SOLN
INTRAMUSCULAR | Status: AC
  Filled 2017-08-17: qty 1

## 2017-08-17 MED ORDER — METHYLENE BLUE 0.5 % INJ SOLN
INTRAVENOUS | Status: AC
  Filled 2017-08-17: qty 10

## 2017-08-17 MED ORDER — CHLORHEXIDINE GLUCONATE CLOTH 2 % EX PADS: 6.0000 | MEDICATED_PAD | Freq: Once | CUTANEOUS | Status: DC

## 2017-08-17 MED ORDER — SODIUM CHLORIDE 0.9 % IJ SOLN
INTRAMUSCULAR | Status: AC
  Filled 2017-08-17: qty 10

## 2017-08-17 MED ORDER — LACTATED RINGERS IV SOLN
INTRAVENOUS | Status: DC
  Administered 2017-08-17: 08:00:00 via INTRAVENOUS

## 2017-08-17 MED ORDER — GLYCOPYRROLATE 0.2 MG/ML IJ SOLN
INTRAMUSCULAR | Status: DC | PRN
  Administered 2017-08-17: 0.2 mg via INTRAVENOUS

## 2017-08-17 MED ORDER — MIDAZOLAM HCL 2 MG/2ML IJ SOLN
INTRAMUSCULAR | Status: AC
Start: 2017-08-17 — End: ?
  Filled 2017-08-17: qty 2

## 2017-08-17 MED ORDER — DEXAMETHASONE SODIUM PHOSPHATE 4 MG/ML IJ SOLN
4.0000 mg | Freq: Once | INTRAMUSCULAR | Status: AC
  Administered 2017-08-17: 4 mg via INTRAVENOUS
  Filled 2017-08-17: qty 1

## 2017-08-17 MED ORDER — TECHNETIUM TC 99M SULFUR COLLOID FILTERED
0.5000 | Freq: Once | INTRAVENOUS | Status: AC | PRN
  Administered 2017-08-17: 0.513 via INTRADERMAL

## 2017-08-17 MED ORDER — BUPIVACAINE HCL (PF) 0.5 % IJ SOLN
INTRAMUSCULAR | Status: DC | PRN
  Administered 2017-08-17: 20 mL

## 2017-08-17 MED ORDER — SUGAMMADEX SODIUM 200 MG/2ML IV SOLN
INTRAVENOUS | Status: AC
Start: 2017-08-17 — End: ?
  Filled 2017-08-17: qty 2

## 2017-08-17 MED ORDER — ROCURONIUM BROMIDE 100 MG/10ML IV SOLN
INTRAVENOUS | Status: DC | PRN
  Administered 2017-08-17: 30 mg via INTRAVENOUS

## 2017-08-17 MED ORDER — OXYCODONE HCL 5 MG PO TABS: 5.0000 mg | ORAL_TABLET | ORAL | 0 refills | Status: DC | PRN

## 2017-08-17 MED ORDER — ONDANSETRON HCL 4 MG/2ML IJ SOLN
4.0000 mg | Freq: Once | INTRAMUSCULAR | Status: AC
  Administered 2017-08-17: 4 mg via INTRAVENOUS

## 2017-08-17 MED ORDER — SUGAMMADEX SODIUM 200 MG/2ML IV SOLN
INTRAVENOUS | Status: DC | PRN
  Administered 2017-08-17: 200 mg via INTRAVENOUS

## 2017-08-17 MED ORDER — SODIUM CHLORIDE 0.9 % IJ SOLN
INTRAVENOUS | Status: DC | PRN
  Administered 2017-08-17: 11:00:00

## 2017-08-17 SURGICAL SUPPLY — 41 items
ADH SKN CLS APL DERMABOND .7 (GAUZE/BANDAGES/DRESSINGS) ×1
APPLIER CLIP 9.375 SM OPEN (CLIP) ×3
APR CLP SM 9.3 20 MLT OPN (CLIP) ×1
BAG HAMPER (MISCELLANEOUS) ×3 IMPLANT
CHLORAPREP W/TINT 26ML (MISCELLANEOUS) ×3 IMPLANT
CLIP APPLIE 9.375 SM OPEN (CLIP) ×1 IMPLANT
CLOTH BEACON ORANGE TIMEOUT ST (SAFETY) ×3 IMPLANT
CONT SPECI 4OZ STER CLIK (MISCELLANEOUS) ×3 IMPLANT
COVER LIGHT HANDLE STERIS (MISCELLANEOUS) ×6 IMPLANT
COVER PROBE W GEL 5X96 (DRAPES) ×3 IMPLANT
DECANTER SPIKE VIAL GLASS SM (MISCELLANEOUS) ×3 IMPLANT
DERMABOND ADVANCED (GAUZE/BANDAGES/DRESSINGS) ×2
DERMABOND ADVANCED .7 DNX12 (GAUZE/BANDAGES/DRESSINGS) ×1 IMPLANT
ELECT REM PT RETURN 9FT ADLT (ELECTROSURGICAL) ×3
ELECTRODE REM PT RTRN 9FT ADLT (ELECTROSURGICAL) ×1 IMPLANT
GLOVE BIO SURGEON STRL SZ 6.5 (GLOVE) ×4 IMPLANT
GLOVE BIO SURGEONS STRL SZ 6.5 (GLOVE) ×3
GLOVE BIOGEL M 6.5 STRL (GLOVE) ×2 IMPLANT
GLOVE BIOGEL PI IND STRL 6.5 (GLOVE) ×1 IMPLANT
GLOVE BIOGEL PI IND STRL 7.0 (GLOVE) ×1 IMPLANT
GLOVE BIOGEL PI IND STRL 7.5 (GLOVE) IMPLANT
GLOVE BIOGEL PI INDICATOR 6.5 (GLOVE) ×6
GLOVE BIOGEL PI INDICATOR 7.0 (GLOVE) ×2
GLOVE BIOGEL PI INDICATOR 7.5 (GLOVE) ×2
GLOVE SURG SS PI 7.5 STRL IVOR (GLOVE) ×6 IMPLANT
GOWN STRL REUS W/TWL LRG LVL3 (GOWN DISPOSABLE) ×9 IMPLANT
KIT TURNOVER KIT A (KITS) ×3 IMPLANT
MANIFOLD NEPTUNE II (INSTRUMENTS) ×3 IMPLANT
NDL HYPO 25X1 1.5 SAFETY (NEEDLE) ×2 IMPLANT
NEEDLE HYPO 25X1 1.5 SAFETY (NEEDLE) ×6 IMPLANT
NS IRRIG 1000ML POUR BTL (IV SOLUTION) ×3 IMPLANT
PACK MINOR (CUSTOM PROCEDURE TRAY) ×3 IMPLANT
PAD ARMBOARD 7.5X6 YLW CONV (MISCELLANEOUS) ×3 IMPLANT
SET BASIN LINEN APH (SET/KITS/TRAYS/PACK) ×3 IMPLANT
SPONGE LAP 18X18 X RAY DECT (DISPOSABLE) ×3 IMPLANT
SUT MNCRL AB 4-0 PS2 18 (SUTURE) ×3 IMPLANT
SUT SILK 2 0 SH (SUTURE) ×2 IMPLANT
SUT VIC AB 3-0 SH 27 (SUTURE) ×3
SUT VIC AB 3-0 SH 27X BRD (SUTURE) ×1 IMPLANT
SYR BULB IRRIGATION 50ML (SYRINGE) ×3 IMPLANT
SYR CONTROL 10ML LL (SYRINGE) ×6 IMPLANT

## 2017-08-17 NOTE — Progress Notes (Signed)
Pt returned from mammo.nad. Has been off 02 and sats 95% RA

## 2017-08-17 NOTE — Progress Notes (Signed)
[facility]  Patient with oxycodone Rx. Looked up in PMP Aware [place] database. Last filled Rx for oxycodone 04/2017, was for 40 pills.   Will get post operative Rx.  Curlene Labrum, MD [facility] [address] [place], [place] [address] [phone] (office)
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
## 2017-08-17 NOTE — Discharge Instructions (Signed)
Discharge Instructions: Shower per your regular routine. Take tylenol and ibuprofen as needed for pain control, alternating every 4-6 hours.  Take Roxicodone for breakthrough pain. Take colace for constipation related to narcotic pain medication. Do not pick at the dermabond glue on your incision sites.   Partial Mastectomy With or Without Axillary Lymph Node Removal, Care After Refer to this sheet in the next few weeks. These instructions provide you with information about caring for yourself after your procedure. Your health care provider may also give you more specific instructions. Your treatment has been planned according to current medical practices, but problems sometimes occur. Call your health care provider if you have any problems or questions after your procedure. What can I expect after the procedure? After your procedure, it is common to have:  Breast swelling.  Breast tenderness.  Stiffness in your arm or shoulder.  A change in the shape and feel of your breast.  Follow these instructions at home: Bathing  Take sponge baths until your health care provider says that you can start showering or bathing.  Do not take baths, swim, or use a hot tub until your health care provider approves. Incision care  There are many different ways to close and cover an incision, including stitches, skin glue, and adhesive strips. Follow your health care provider's instructions about: ? Incision care. ? Bandage (dressing) changes and removal. ? Incision closure removal.  Check your incision area every day for signs of infection. Watch for: ? Redness, swelling, or pain. ? Fluid, blood, or pus.  If you were sent home with a surgical drain in place, follow your health care provider's instructions for emptying it. Activity  Return to your normal activities as directed by your health care provider.  Avoid strenuous exercise.  Be careful to avoid any activities that could cause an  injury to your arm on the side of your surgery.  Do not lift anything that is heavier than 10 lb (4.5 kg). Avoid lifting with the arm that is on the side of your surgery.  Do not carry heavy objects on your shoulder.  After your drain is removed, you should perform exercises to keep your arm from getting stiff and swollen. Talk with your health care provider about which exercises are safe for you. General instructions  Take medicines only as directed by your health care provider.  Keep your dressing clean and dry.  You may eat what you usually do.  Wear a supportive bra as directed by your health care provider.  Keep your arm elevated when at rest.  Do not wear tight jewelry on your arm, wrist, or fingers on the side of your surgery.  Get checked for extra fluid around your lymph nodes (lymphedema) as often as told by your health care provider.  If you had any lymph nodes removed during your procedure, be sure to tell all of your health care providers. This is important information to share before you are involved in certain procedures, such as giving blood or having your blood pressure taken. Contact a health care provider if:  You have a fever.  Your pain medicine is not working.  Your swelling, weakness, or numbness in your arm has not improved after a few weeks.  You have new swelling in your breast or arm.  You have redness, swelling, or pain in your incision area.  You have fluid, blood, or pus coming from your incision. Get help right away if:  You have very bad pain  in your breast or arm.  You have chest pain.  You have difficulty breathing. This information is not intended to replace advice given to you by your health care provider. Make sure you discuss any questions you have with your health care provider. Document Released: 12/30/2003 Document Revised: 01/22/2016 Document Reviewed: 01/30/2014 Elsevier Interactive Patient Education  2018 [employer].

## 2017-08-17 NOTE — Progress Notes (Signed)
Nuclear med in to complete the sentinal node injection started 0955 completed @ 1000. Tolerated well.

## 2017-08-17 NOTE — Progress Notes (Signed)
Pt taken to radiology for breast loc

## 2017-08-17 NOTE — Anesthesia Postprocedure Evaluation (Signed)
Anesthesia Post Note  Patient: [name]  Procedure(s) Performed: PARTIAL MASTECTOMY WITH NEEDLE LOCALIZATION AND AXILLARY SENTINEL LYMPH NODE BX (Left )  Patient location during evaluation: PACU Anesthesia Type: General Level of consciousness: awake and alert, oriented and patient cooperative Pain management: pain level controlled Vital Signs Assessment: post-procedure vital signs reviewed and stable Respiratory status: spontaneous breathing and respiratory function stable Cardiovascular status: stable Postop Assessment: no apparent nausea or vomiting Anesthetic complications: no     Last Vitals:  Vitals:   08/17/17 1015 08/17/17 1215  BP: (!) 119/58 119/88  Pulse:  (!) 59  Resp: 18 16  Temp:  36.8 C  SpO2: 97% 97%    Last Pain:  Vitals:   08/17/17 0720  TempSrc: Oral                 Nelma Phagan A

## 2017-08-17 NOTE — OR Nursing (Signed)
Patient returned to preop room 2 , via w/c accompanied with preop staff. Nuc med notified they are waiting on medication to arrive from [place] to be able to inject patient.

## 2017-08-17 NOTE — Op Note (Signed)
[facility] Operative Note  08/17/17  Preoperative Diagnosis:  Left breast cancer    Postoperative Diagnosis: Same   Procedure(s) Performed:  Partial mastectomy after needle localization and sentinel lymph node biopsy    Surgeon: Lanell Matar. Constance Haw, MD   Assistants:  Patsey Berthold, MD   Anesthesia: General endotracheal   Anesthesiologist: Lerry Liner, MD    Specimens:   Left breast mass (short superior and long lateral)  Superficial; deep, medial, lateral, superior and inferior margins Sentinel Nodes    Estimated Blood Loss: Minimal   Blood Replacement: None    Complications: None   Wound Class: Clean    Operative Indications: Ms. Quesenberry is a 72 yo with a history of left breast cancer that was identified on mammogram that was obtained for her right breast pain. She had no reported masses or breast changes, and her mass was not palpable on exam.  After a discussion of the risk and benefits of partial mastectomy and sentinel lymph node including bleeding, infection, seroma formation, positive margins, positive lymph nodes requiring more surgery, and need for radiation and possibly chemotherapy, she opted to proceed.  She was sent for needle localization and radiotracer injection prior to the operation.    Findings: Large breast with fibrotic capsule likely from the biopsy    Procedure: The patient was taken to the operating room and placed supine. General endotracheal anesthesia was induced.  Prior to prepping and draping diluted blue dye was injected into the 4 quadrants subdermally around the areola complex and massaged for five minutes.  Intravenous antibiotics were  administered per protocol.  The left breast and arm pit was prepared and draped in the usual sterile fashion.   A handheld gamma probe was used to identify the hottest spot in the axilla, The counts before the incision were 200-300. An incision was made and carried down through the subcutaneous  tissue and the clavipectoral fascia was incised. The probe was placed and counts in the 200-300s were again noted.  A slightly blue lymph node was identified and dissected out in its entirety, and ex vivo it measured 2865.  The probe was then placed back into the field and readings of 400-500 were obtained.  A second slightly blue node was identified and excised, and ex vivo it only read 200.  The probe was replaced and again the background read 200-300s, and a lymph node was visualized but was not blue. This was dissected out and ex vivo measured 250.  No additional hot spots were identified and no additional blue lymphatics were seen. No nodes were palpated.  A lap pad was placed into the axilla.    Attention was then turned to the breast mass. The localization studies were reviewed and the trajectory of the needle was determined.  A circumareolar incision was made and the needle was brought into the field.  An Alis clamp was used to place traction on the tissue.  Skin flaps were developed and the mass of tissue around the wire was removed. There was some fibrotic scarring noted, likely from the prior biopsy, but no obvious hard, palpable masses.  The specimen was marked short superior and long lateral. The specimen was removed and sent to radiology, which confirmed the presence of the clip in the specimen.  Representative margins were taken in the superficial, deep, inferior, superior, medial, and lateral margins.  The lymph nodes and margins were sent to pathology.    Final inspection revealed acceptable hemostasis in both cavities.  The axillary incision was closed with interrupted 3-0 Vicryl in the clavipectoral fascia and deep dermal layer. The skin was closed with a 4-0 Monocryl and Dermabond.  The breast incision was closed with a deep dermal 3-0 Vicryl and the skin was closed with a 4-0 Monocryl and dermabond. All counts were correct at the end of the case. The patient was awakened from anesthesia and  extubate without complication.  The patient went to the PACU in stable condition.   Curlene Labrum, MD [facility] [address] [place], [place] [address] [phone] (office)

## 2017-08-17 NOTE — Interval H&P Note (Signed)
History and Physical Interval Note:  08/17/2017 9:21 AM  [name]  has presented today for surgery, with the diagnosis of left breast cancer  The various methods of treatment have been discussed with the patient and family. After consideration of risks, benefits and other options for treatment, the patient has consented to  Procedure(s): PARTIAL MASTECTOMY WITH NEEDLE LOCALIZATION AND AXILLARY SENTINEL LYMPH NODE BX (Left) as a surgical intervention .  The patient's history has been reviewed, patient examined, no change in status, stable for surgery.  I have reviewed the patient's chart and labs.  Questions were answered to the patient's satisfaction.    Needle localization performed. Awaiting radiotracer. Marked on the left.   Virl Cagey

## 2017-08-17 NOTE — Transfer of Care (Signed)
Immediate Anesthesia Transfer of Care Note  Patient: [name]  Procedure(s) Performed: PARTIAL MASTECTOMY WITH NEEDLE LOCALIZATION AND AXILLARY SENTINEL LYMPH NODE BX (Left )  Patient Location: PACU  Anesthesia Type:General  Level of Consciousness: awake, alert  and oriented  Airway & Oxygen Therapy: Patient Spontanous Breathing and Patient connected to nasal cannula oxygen  Post-op Assessment: Report given to RN and Post -op Vital signs reviewed and stable  Post vital signs: Reviewed stable  Last Vitals:  Vitals:   08/17/17 1010 08/17/17 1015  BP: 120/62 (!) 119/58  Pulse:    Resp: (!) 22 18  Temp:    SpO2: 96% 97%    Last Pain:  Vitals:   08/17/17 0720  TempSrc: Oral         Complications: No apparent anesthesia complications

## 2017-08-17 NOTE — Anesthesia Procedure Notes (Signed)
Procedure Name: Intubation Date/Time: 08/17/2017 10:31 AM Performed by: Jonna Munro, CRNA Pre-anesthesia Checklist: Patient identified, Emergency Drugs available, Suction available, Patient being monitored and Timeout performed Patient Re-evaluated:Patient Re-evaluated prior to induction Oxygen Delivery Method: Circle system utilized Preoxygenation: Pre-oxygenation with 100% oxygen Induction Type: IV induction, Cricoid Pressure applied and Rapid sequence Laryngoscope Size: Mac and Robertshaw Grade View: Grade I Tube type: Oral Tube size: 7.0 mm Number of attempts: 1 Airway Equipment and Method: Stylet Placement Confirmation: ETT inserted through vocal cords under direct vision,  positive ETCO2 and breath sounds checked- equal and bilateral Secured at: 21 cm Tube secured with: Tape Dental Injury: Teeth and Oropharynx as per pre-operative assessment

## 2017-08-18 ENCOUNTER — Encounter (HOSPITAL_COMMUNITY): Payer: Self-pay | Admitting: General Surgery

## 2017-08-18 NOTE — Addendum Note (Signed)
Addendum  created 08/18/17 0705 by Vista Deck, CRNA   Charge Capture section accepted

## 2017-08-31 ENCOUNTER — Other Ambulatory Visit: Payer: Self-pay | Admitting: Nurse Practitioner

## 2017-09-01 ENCOUNTER — Ambulatory Visit (INDEPENDENT_AMBULATORY_CARE_PROVIDER_SITE_OTHER): Payer: Self-pay | Admitting: General Surgery

## 2017-09-01 ENCOUNTER — Encounter: Payer: Self-pay | Admitting: General Surgery

## 2017-09-01 VITALS — BP 141/84 | HR 49 | Temp 97.8°F | Resp 18 | Ht 61.0 in | Wt 196.0 lb

## 2017-09-01 DIAGNOSIS — C50512 Malignant neoplasm of lower-outer quadrant of left female breast: Secondary | ICD-10-CM

## 2017-09-01 NOTE — Progress Notes (Signed)
[facility] Note   HPI:  72 y.o. Female presents to clinic for post-op follow-up evaluation after a left partial mastectomy and sentinel lymph node dissection. She is doing well. Feeling ok. Some pain.   Review of Systems:  No fevers or chills All other review of systems: otherwise negative   Pathology: Diagnosis 1. Breast, lumpectomy, left - INFILTRATING DUCTAL CARCINOMA, 0.6 CM, GRADE I. - MEDIAL RESECTION EDGE IS LESS THAN 0.1 CM FROM TUMOR (FINAL MEDIAL MARGIN IS REPRESENTED BY PART 9, WHICH IS NEGATIVE FOR CARCINOMA). - NO EVIDENCE OF LYMPHOVASCULAR OR PERINEURAL INVASION. - BIOPSY SITE CHANGES. - SEE ONCOLOGY TABLE. 2. Lymph node, sentinel, biopsy - LYMPH NODE, NEGATIVE FOR CARCINOMA (0/1). 3. Lymph node, sentinel, biopsy - LYMPH NODE, NEGATIVE FOR CARCINOMA (0/1). 4. Lymph node, sentinel, biopsy - LYMPH NODE, NEGATIVE FOR CARCINOMA (0/1). 5. Lymph node, sentinel, biopsy - LYMPH NODE, NEGATIVE FOR CARCINOMA (0/1). 6. Breast, excision, left lateral - BENIGN BREAST TISSUE, NEGATIVE FOR CARCINOMA. 7. Breast, excision, left superior - BENIGN BREAST TISSUE WITH FIBROCYSTIC CHANGE. - NEGATIVE FOR CARCINOMA. 8. Breast, excision, left inferior - BENIGN BREAST TISSUE, NEGATIVE FOR CARCINOMA. 9. Breast, excision, left medial - BENIGN BREAST TISSUE, NEGATIVE FOR CARCINOMA. 10. Breast, excision, left deep - BENIGN BREAST TISSUE WITH FIBROCYSTIC CHANGE. - NEGATIVE FOR CARCINOMA. 11. Breast, excision, left superficial - BENIGN BREAST TISSUE, NEGATIVE FOR CARCINOMA.  Vital Signs:  BP (!) 141/84   Pulse (!) 49   Temp 97.8 F (36.6 C)   Resp 18   Ht 5\' 1"  (1.549 m)   Wt 196 lb (88.9 kg)   BMI 37.03 kg/m    Physical Exam:  Physical Exam  Constitutional: She is well-developed, well-nourished, and in no distress.  Neck: Normal range of motion.  Cardiovascular: Normal rate.  Pulmonary/Chest: Effort normal.  Left breast incision c/d/i with some peeling  dermabond, left axilla incision c/d/i with peeling dermabond, no erythema or drainage, some bruising on the lower outer quadrant of breast   Musculoskeletal: Normal range of motion.  Vitals reviewed.   Laboratory studies: None    Imaging: None  Assessment:  72 y.o. yo Female with a Stage I left breast cancer. Doing well.   Plan:  - Will need radiation  - Has follow up at the [facility] in the upcoming weeks   Future Appointments  Date Time Provider [place]  09/20/2017  1:40 PM Higgs, Mathis Dad, MD [facility] None    All of the above recommendations were discussed with the patient and patient's family, and all of patient's and family's questions were answered to their expressed satisfaction.  Curlene Labrum, MD [facility] [address] [place], [place] [address] [phone] (office)

## 2017-09-13 ENCOUNTER — Ambulatory Visit (HOSPITAL_COMMUNITY): Payer: Medicare Other | Admitting: Internal Medicine

## 2017-09-20 ENCOUNTER — Ambulatory Visit (HOSPITAL_COMMUNITY): Payer: Medicare Other | Admitting: Internal Medicine

## 2017-09-22 ENCOUNTER — Encounter: Payer: Self-pay | Admitting: General Surgery

## 2017-09-22 ENCOUNTER — Ambulatory Visit (INDEPENDENT_AMBULATORY_CARE_PROVIDER_SITE_OTHER): Payer: Self-pay | Admitting: General Surgery

## 2017-09-22 VITALS — BP 168/57 | HR 49 | Temp 97.8°F | Resp 18 | Ht 61.0 in | Wt 193.0 lb

## 2017-09-22 DIAGNOSIS — T8131XA Disruption of external operation (surgical) wound, not elsewhere classified, initial encounter: Secondary | ICD-10-CM

## 2017-09-22 NOTE — Patient Instructions (Signed)
Keep a dry gauze over the area daily with tape to secure the gauze in place.  Try to keep your breast from hanging/ moving excessively.   Wound Dehiscence Wound dehiscence is when a cut from surgery (an incision) opens up and does not heal like it should. This problem usually happens 7-10 days after surgery. You may have bleeding from the cut. You may also have pain or a fever. This condition should be treated early. Follow these instructions at home: Medicines  Take over-the-counter and prescription medicines only as told by your doctor.  If you were prescribed an antibiotic medicine, take it as told by your doctor. Do not stop taking it even if you start to feel better.  Use medicines that help to stop itching as told by your doctor. The wound may feel itchy as it heals. Wound care  Follow instructions from your doctor about how to take care of your wound. Make sure you: ? Wash your hands with soap and water before you change your bandage (dressing) or wash the wound area. If you cannot use soap and water, use hand sanitizer. ? Wash your wound with mild soap and water 2 times a day, or as told. Rinse off the soap. Pat the area dry with a clean towel. Do not rub the wound. ? Change bandages as told by your doctor.  Do not pick or scratch at the wound.  Check your wound area every day for signs of infection. Check for: ? More redness, swelling, or pain. ? More fluid or blood. ? Warmth. ? Pus or a bad smell. Activity  Avoid exercises that make you sweat or could stretch your wound.  Do not lift anything that is heavier than 10 lb (4.5 kg) until the wound is healed or until your doctor says that it is safe. General instructions  Do not take baths, swim, or use a hot tub until your doctor approves. You may take showers.  Keep all follow-up visits as told by your doctor. This is important. Contact a doctor if:  Your wound does not seem to be healing right.  You have a fever. Get  help right away if:  You have more redness, swelling, or pain around your wound.  You have more fluid coming from your wound.  Your wound feels warm to the touch.  You have pus or a bad smell coming from your wound.  More of the wound breaks open.  You have red streaks spreading from your wound.  You have a lot of bleeding from your wound. Summary  Wound dehiscence is when a cut from surgery (an incision) opens up and does not heal like it should.  Follow instructions from your doctor about how to take care of your wound.  Check your wound area every day for signs of infection. These signs can be redness, swelling, pain, fluid, blood, warmth, pus, or a bad smell.  If you were prescribed an antibiotic medicine, take it as told by your doctor. Do not stop taking it even if you start to feel better.  Contact a doctor if your wound does not seem to be healing right. This information is not intended to replace advice given to you by your health care provider. Make sure you discuss any questions you have with your health care provider. Document Released: 05/05/2009 Document Revised: 04/21/2016 Document Reviewed: 04/21/2016 Elsevier Interactive Patient Education  2017 [employer].

## 2017-09-22 NOTE — Progress Notes (Signed)
[facility] Note   HPI:  72 y.o. Female presents to clinic for follow-up evaluation of the left breast incision. Patient reports that the incision split open and was bleeding. Her husband called the clinic this am for an appointment.   Review of Systems:  No fevers or chills Some soreness  Not started radiation yet  All other review of systems: otherwise negative   Vital Signs:  BP (!) 168/57   Pulse (!) 49   Temp 97.8 F (36.6 C)   Resp 18   Ht 5\' 1"  (1.549 m)   Wt 193 lb (87.5 kg)   BMI 36.47 kg/m    Physical Exam:  Physical Exam  Constitutional: She appears well-developed.  HENT:  Head: Normocephalic.  Neck: Normal range of motion.  Cardiovascular: Normal rate.  Pulmonary/Chest: Effort normal.  Left breast lower outer incision with small area about 1cm of superficial dehiscence of the skin, no erythema or drainage; axilla incision healed  Vitals reviewed.     Laboratory studies: None   Imaging:  None    Assessment:  72 y.o. yo Female with a small superficial skin dehiscence on the breast. Her axilla is healed well. I imagine some of the issues with the breast is from strain/ tension from the weight of the breast hanging and no bra.  Plan:  - Dry gauze to the area  - Should scab over/ close up in the next week or so  - Will see back  - No antibiotics necessary at this time - Has not seen Oncology since my Surgery, will make sure she is following up with them   Future Appointments  Date Time Provider [place]  10/04/2017 11:45 AM Virl Cagey, MD [facility] None    All of the above recommendations were discussed with the patient and patient's family, and all of patient's and family's questions were answered to their expressed satisfaction.  Curlene Labrum, MD [facility] [address] [place], [place] [address] [phone] (office)

## 2017-10-04 ENCOUNTER — Encounter: Payer: Self-pay | Admitting: General Surgery

## 2017-10-04 ENCOUNTER — Ambulatory Visit: Payer: Self-pay | Admitting: General Surgery

## 2017-10-04 ENCOUNTER — Ambulatory Visit (INDEPENDENT_AMBULATORY_CARE_PROVIDER_SITE_OTHER): Payer: Self-pay | Admitting: General Surgery

## 2017-10-04 VITALS — BP 126/50 | HR 49 | Temp 98.2°F | Resp 18 | Ht 61.0 in | Wt 195.0 lb

## 2017-10-04 DIAGNOSIS — C50512 Malignant neoplasm of lower-outer quadrant of left female breast: Secondary | ICD-10-CM

## 2017-10-04 DIAGNOSIS — T8131XA Disruption of external operation (surgical) wound, not elsewhere classified, initial encounter: Secondary | ICD-10-CM

## 2017-10-04 NOTE — Patient Instructions (Signed)
Continue dry dressing as you are doing.  Follow up with Oncology.

## 2017-10-04 NOTE — Progress Notes (Signed)
[facility] Note   HPI:  72 y.o. Female presents to clinic for follow-up evaluation of her left breast wound dehiscence. She is doing well. The wound is looking better but is bruised. She is now wearing a bra. She has follow up with oncology 5/14, and understands that she needs to get more information about radiation. I attempted to talk to her today about the need for radiation and need for additional treatments, she reports she is "scared".    Review of Systems:  No fevers or chills  All other review of systems: otherwise negative   Vital Signs:  BP (!) 126/50 (BP Location: Left Arm, Patient Position: Sitting, Cuff Size: Normal)   Pulse (!) 49   Temp 98.2 F (36.8 C) (Temporal)   Resp 18   Ht 5\' 1"  (1.549 m)   Wt 195 lb (88.5 kg)   BMI 36.84 kg/m    Physical Exam:  Physical Exam  Constitutional: She appears well-developed.  HENT:  Head: Normocephalic.  Cardiovascular: Normal rate.  Pulmonary/Chest: Effort normal.  Left breast wound, bruising, minimal serous drainage, superficial dehiscence <1cm   Vitals reviewed.     Laboratory studies: None   Imaging:  None   Assessment:  72 y.o. yo Female with left breast cancer and a superficial wound dehiscence, doing better. Healing.   Plan:  - Will see oncology again, and I reiterated the need for radiation and multi modality treatment for breast cancer, patient just needs more information and has false information about radiation which is preventing her from wanting to do it despite [place] discussing that it was a must post operatively after a lumpectomy  - Will see back for the wound in a few weeks, dry dressing to continue   Future Appointments  Date Time Provider [place]  10/10/2017  1:20 PM Higgs, Mathis Dad, MD [facility] None  10/25/2017 11:30 AM Virl Cagey, MD [facility] None     All of the above recommendations were discussed with the patient and patient's family, and all of patient's and  family's questions were answered to their expressed satisfaction.  Curlene Labrum, MD [facility] [address] [place], [place] [address] [phone] (office)

## 2017-10-10 ENCOUNTER — Encounter (HOSPITAL_COMMUNITY): Payer: Self-pay | Admitting: Lab

## 2017-10-10 ENCOUNTER — Inpatient Hospital Stay (HOSPITAL_COMMUNITY): Payer: Medicare Other | Attending: Internal Medicine | Admitting: Internal Medicine

## 2017-10-10 ENCOUNTER — Encounter (HOSPITAL_COMMUNITY): Payer: Self-pay | Admitting: Internal Medicine

## 2017-10-10 DIAGNOSIS — N183 Chronic kidney disease, stage 3 (moderate): Secondary | ICD-10-CM | POA: Diagnosis not present

## 2017-10-10 DIAGNOSIS — C50512 Malignant neoplasm of lower-outer quadrant of left female breast: Secondary | ICD-10-CM | POA: Insufficient documentation

## 2017-10-10 DIAGNOSIS — N644 Mastodynia: Secondary | ICD-10-CM

## 2017-10-10 DIAGNOSIS — I129 Hypertensive chronic kidney disease with stage 1 through stage 4 chronic kidney disease, or unspecified chronic kidney disease: Secondary | ICD-10-CM | POA: Insufficient documentation

## 2017-10-10 DIAGNOSIS — E1122 Type 2 diabetes mellitus with diabetic chronic kidney disease: Secondary | ICD-10-CM | POA: Diagnosis not present

## 2017-10-10 DIAGNOSIS — Z170 Estrogen receptor positive status [ER+]: Secondary | ICD-10-CM | POA: Insufficient documentation

## 2017-10-10 DIAGNOSIS — Z780 Asymptomatic menopausal state: Secondary | ICD-10-CM

## 2017-10-10 NOTE — Progress Notes (Unsigned)
Referral sent to [facility]. Records faxed on 5/13
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
## 2017-10-10 NOTE — Patient Instructions (Signed)
[facility]  Discharge Instructions:  You were seen by Dr. Higgs today _______________________________________________________________  Thank you for choosing [facility] at [facility] to provide your oncology and hematology care.  To afford each patient quality time with our providers, please arrive at least 15 minutes before your scheduled appointment.  You need to re-schedule your appointment if you arrive 10 or more minutes late.  We strive to give you quality time with our providers, and arriving late affects you and other patients whose appointments are after yours.  Also, if you no show three or more times for appointments you may be dismissed from the clinic.  Again, thank you for choosing [facility] at [facility]. Our hope is that these requests will allow you access to exceptional care and in a timely manner. _______________________________________________________________  If you have questions after your visit, please contact our office at [phone] between the hours of 8:30 a.m. and 5:00 p.m. Voicemails left after 4:30 p.m. will not be returned until the following business day. _______________________________________________________________  For prescription refill requests, have your pharmacy contact our office. _______________________________________________________________  Recommendations made by the consultant and any test results will be sent to your referring physician. _______________________________________________________________ 

## 2017-10-10 NOTE — Progress Notes (Signed)
Oncotype sent.  Fax conformed to pathology requesting tissue be sent to oncotype for testing. Fax confirmed that order was placed online and faxed as well.

## 2017-10-17 ENCOUNTER — Encounter: Payer: Self-pay | Admitting: Internal Medicine

## 2017-10-18 DIAGNOSIS — I110 Hypertensive heart disease with heart failure: Secondary | ICD-10-CM | POA: Diagnosis not present

## 2017-10-18 DIAGNOSIS — Z170 Estrogen receptor positive status [ER+]: Secondary | ICD-10-CM | POA: Diagnosis not present

## 2017-10-18 DIAGNOSIS — E079 Disorder of thyroid, unspecified: Secondary | ICD-10-CM | POA: Diagnosis not present

## 2017-10-18 DIAGNOSIS — Z7984 Long term (current) use of oral hypoglycemic drugs: Secondary | ICD-10-CM | POA: Diagnosis not present

## 2017-10-18 DIAGNOSIS — C50512 Malignant neoplasm of lower-outer quadrant of left female breast: Secondary | ICD-10-CM | POA: Diagnosis not present

## 2017-10-18 DIAGNOSIS — Z79899 Other long term (current) drug therapy: Secondary | ICD-10-CM | POA: Diagnosis not present

## 2017-10-18 DIAGNOSIS — I509 Heart failure, unspecified: Secondary | ICD-10-CM | POA: Diagnosis not present

## 2017-10-18 DIAGNOSIS — K219 Gastro-esophageal reflux disease without esophagitis: Secondary | ICD-10-CM | POA: Diagnosis not present

## 2017-10-18 DIAGNOSIS — Z87891 Personal history of nicotine dependence: Secondary | ICD-10-CM | POA: Diagnosis not present

## 2017-10-18 DIAGNOSIS — Z885 Allergy status to narcotic agent status: Secondary | ICD-10-CM | POA: Diagnosis not present

## 2017-10-18 DIAGNOSIS — C50412 Malignant neoplasm of upper-outer quadrant of left female breast: Secondary | ICD-10-CM | POA: Diagnosis not present

## 2017-10-19 ENCOUNTER — Ambulatory Visit (HOSPITAL_COMMUNITY)
Admission: RE | Admit: 2017-10-19 | Discharge: 2017-10-19 | Disposition: A | Discharge status: Home / Self Care | Payer: Medicare Other | Source: Ambulatory Visit | Attending: Internal Medicine | Admitting: Internal Medicine

## 2017-10-19 DIAGNOSIS — M85852 Other specified disorders of bone density and structure, left thigh: Secondary | ICD-10-CM | POA: Diagnosis not present

## 2017-10-19 DIAGNOSIS — C50512 Malignant neoplasm of lower-outer quadrant of left female breast: Secondary | ICD-10-CM

## 2017-10-19 DIAGNOSIS — Z170 Estrogen receptor positive status [ER+]: Secondary | ICD-10-CM | POA: Diagnosis not present

## 2017-10-19 DIAGNOSIS — Z780 Asymptomatic menopausal state: Secondary | ICD-10-CM

## 2017-10-19 DIAGNOSIS — C50212 Malignant neoplasm of upper-inner quadrant of left female breast: Secondary | ICD-10-CM | POA: Insufficient documentation

## 2017-10-20 ENCOUNTER — Encounter (HOSPITAL_COMMUNITY): Payer: Self-pay | Admitting: Internal Medicine

## 2017-10-25 ENCOUNTER — Ambulatory Visit: Payer: Medicare Other | Admitting: General Surgery

## 2017-11-10 ENCOUNTER — Inpatient Hospital Stay (HOSPITAL_COMMUNITY): Payer: Medicare Other | Attending: Internal Medicine | Admitting: Internal Medicine

## 2017-11-22 DIAGNOSIS — E876 Hypokalemia: Secondary | ICD-10-CM | POA: Diagnosis not present

## 2017-11-22 DIAGNOSIS — B029 Zoster without complications: Secondary | ICD-10-CM | POA: Diagnosis not present

## 2017-11-22 DIAGNOSIS — M792 Neuralgia and neuritis, unspecified: Secondary | ICD-10-CM | POA: Diagnosis not present

## 2017-12-05 ENCOUNTER — Other Ambulatory Visit: Payer: Self-pay | Admitting: Nurse Practitioner

## 2018-01-20 ENCOUNTER — Other Ambulatory Visit: Payer: Self-pay | Admitting: Nurse Practitioner

## 2018-01-25 DIAGNOSIS — B0223 Postherpetic polyneuropathy: Secondary | ICD-10-CM | POA: Diagnosis not present

## 2018-01-25 DIAGNOSIS — M79601 Pain in right arm: Secondary | ICD-10-CM | POA: Diagnosis not present

## 2018-02-26 ENCOUNTER — Encounter (HOSPITAL_COMMUNITY): Payer: Self-pay | Admitting: Emergency Medicine

## 2018-02-26 ENCOUNTER — Other Ambulatory Visit: Payer: Self-pay

## 2018-02-26 ENCOUNTER — Emergency Department (HOSPITAL_COMMUNITY): Payer: Medicare Other

## 2018-02-26 ENCOUNTER — Emergency Department (HOSPITAL_COMMUNITY)
Admission: EM | Admit: 2018-02-26 | Discharge: 2018-02-26 | Disposition: A | Discharge status: Home / Self Care | Payer: Medicare Other | Attending: Emergency Medicine | Admitting: Emergency Medicine

## 2018-02-26 DIAGNOSIS — I5030 Unspecified diastolic (congestive) heart failure: Secondary | ICD-10-CM | POA: Insufficient documentation

## 2018-02-26 DIAGNOSIS — E1122 Type 2 diabetes mellitus with diabetic chronic kidney disease: Secondary | ICD-10-CM | POA: Diagnosis not present

## 2018-02-26 DIAGNOSIS — J449 Chronic obstructive pulmonary disease, unspecified: Secondary | ICD-10-CM | POA: Insufficient documentation

## 2018-02-26 DIAGNOSIS — Z87891 Personal history of nicotine dependence: Secondary | ICD-10-CM | POA: Diagnosis not present

## 2018-02-26 DIAGNOSIS — N183 Chronic kidney disease, stage 3 (moderate): Secondary | ICD-10-CM | POA: Diagnosis not present

## 2018-02-26 DIAGNOSIS — B0229 Other postherpetic nervous system involvement: Secondary | ICD-10-CM | POA: Insufficient documentation

## 2018-02-26 DIAGNOSIS — M79601 Pain in right arm: Secondary | ICD-10-CM | POA: Diagnosis present

## 2018-02-26 DIAGNOSIS — E039 Hypothyroidism, unspecified: Secondary | ICD-10-CM | POA: Insufficient documentation

## 2018-02-26 DIAGNOSIS — R05 Cough: Secondary | ICD-10-CM | POA: Insufficient documentation

## 2018-02-26 DIAGNOSIS — R059 Cough, unspecified: Secondary | ICD-10-CM

## 2018-02-26 DIAGNOSIS — Z7982 Long term (current) use of aspirin: Secondary | ICD-10-CM | POA: Insufficient documentation

## 2018-02-26 DIAGNOSIS — I130 Hypertensive heart and chronic kidney disease with heart failure and stage 1 through stage 4 chronic kidney disease, or unspecified chronic kidney disease: Secondary | ICD-10-CM | POA: Diagnosis not present

## 2018-02-26 DIAGNOSIS — I2510 Atherosclerotic heart disease of native coronary artery without angina pectoris: Secondary | ICD-10-CM | POA: Insufficient documentation

## 2018-02-26 DIAGNOSIS — Z79899 Other long term (current) drug therapy: Secondary | ICD-10-CM | POA: Insufficient documentation

## 2018-02-26 HISTORY — DX: Malignant (primary) neoplasm, unspecified: C80.1

## 2018-02-26 LAB — CBC
HCT: 34.2 % — ABNORMAL LOW (ref 36.0–46.0)
Hemoglobin: 10.1 g/dL — ABNORMAL LOW (ref 12.0–15.0)
MCH: 29.9 pg (ref 26.0–34.0)
MCHC: 29.5 g/dL — ABNORMAL LOW (ref 30.0–36.0)
MCV: 101.2 fL — ABNORMAL HIGH (ref 78.0–100.0)
Platelets: 268 10*3/uL (ref 150–400)
RBC: 3.38 MIL/uL — ABNORMAL LOW (ref 3.87–5.11)
RDW: 17.5 % — ABNORMAL HIGH (ref 11.5–15.5)
WBC: 4.2 10*3/uL (ref 4.0–10.5)

## 2018-02-26 LAB — URINALYSIS, ROUTINE W REFLEX MICROSCOPIC
Bilirubin Urine: NEGATIVE
Glucose, UA: NEGATIVE mg/dL
Hgb urine dipstick: NEGATIVE
Ketones, ur: NEGATIVE mg/dL
Nitrite: NEGATIVE
Protein, ur: 100 mg/dL — AB
Specific Gravity, Urine: 1.021 (ref 1.005–1.030)
WBC, UA: >50 WBC/hpf — ABNORMAL HIGH (ref 0–5)
pH: 5 (ref 5.0–8.0)

## 2018-02-26 LAB — COMPREHENSIVE METABOLIC PANEL
ALT: 13 U/L (ref 0–44)
AST: 28 U/L (ref 15–41)
Albumin: 3.1 g/dL — ABNORMAL LOW (ref 3.5–5.0)
Alkaline Phosphatase: 59 U/L (ref 38–126)
Anion gap: 6 (ref 5–15)
BUN: 13 mg/dL (ref 8–23)
CO2: 24 mmol/L (ref 22–32)
Calcium: 8.2 mg/dL — ABNORMAL LOW (ref 8.9–10.3)
Chloride: 109 mmol/L (ref 98–111)
Creatinine, Ser: 0.84 mg/dL (ref 0.44–1.00)
GFR calc Af Amer: >60 mL/min (ref >60)
GFR calc non Af Amer: >60 mL/min (ref >60)
Glucose, Bld: 110 mg/dL — ABNORMAL HIGH (ref 70–99)
Potassium: 3.8 mmol/L (ref 3.5–5.1)
Sodium: 139 mmol/L (ref 135–145)
Total Bilirubin: 1.1 mg/dL (ref 0.3–1.2)
Total Protein: 9.5 g/dL — ABNORMAL HIGH (ref 6.5–8.1)

## 2018-02-26 LAB — LIPASE, BLOOD: Lipase: 30 U/L (ref 11–51)

## 2018-02-26 MED ORDER — PROMETHAZINE HCL 12.5 MG PO TABS
12.5000 mg | ORAL_TABLET | Freq: Once | ORAL | Status: AC
  Administered 2018-02-26: 12.5 mg via ORAL
  Filled 2018-02-26: qty 1

## 2018-02-26 MED ORDER — LIDOCAINE 5 % EX PTCH: 1.0000 | MEDICATED_PATCH | Freq: Every day | CUTANEOUS | 0 refills | Status: DC | PRN

## 2018-02-26 MED ORDER — POTASSIUM CHLORIDE CRYS ER 20 MEQ PO TBCR: 20.0000 meq | EXTENDED_RELEASE_TABLET | Freq: Every day | ORAL | 0 refills | Status: DC

## 2018-02-26 MED ORDER — FUROSEMIDE 40 MG PO TABS
40.0000 mg | ORAL_TABLET | Freq: Once | ORAL | Status: AC
  Administered 2018-02-26: 40 mg via ORAL
  Filled 2018-02-26: qty 1

## 2018-02-26 MED ORDER — LORAZEPAM 1 MG PO TABS
1.0000 mg | ORAL_TABLET | Freq: Once | ORAL | Status: AC
  Administered 2018-02-26: 1 mg via ORAL
  Filled 2018-02-26: qty 1

## 2018-02-26 MED ORDER — ONDANSETRON HCL 4 MG/2ML IJ SOLN
4.0000 mg | Freq: Once | INTRAMUSCULAR | Status: DC | PRN
  Filled 2018-02-26: qty 2

## 2018-02-26 MED ORDER — POTASSIUM CHLORIDE CRYS ER 20 MEQ PO TBCR
40.0000 meq | EXTENDED_RELEASE_TABLET | Freq: Once | ORAL | Status: AC
  Administered 2018-02-26: 40 meq via ORAL
  Filled 2018-02-26: qty 2

## 2018-02-26 MED ORDER — FUROSEMIDE 20 MG PO TABS: 20.0000 mg | ORAL_TABLET | Freq: Every day | ORAL | 0 refills | Status: DC
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
## 2018-02-26 NOTE — ED Triage Notes (Addendum)
Patient c/o nausea, projectile vomiting, and frequent urination. Denies any diarrhea. Per patient fevers. Patient states that she has pain in back and right arm that she states has been intermittent since June related to shingles. No active blistering noted. Patient confused to date and unorganized train of thought. Patient's O2 sat 85-89% on room air and HR 55. Patient denies shortness of breath at this time but states she has a cough and when she starts coughing she becomes short of breath.

## 2018-03-02 DIAGNOSIS — K219 Gastro-esophageal reflux disease without esophagitis: Secondary | ICD-10-CM | POA: Diagnosis not present

## 2018-03-02 DIAGNOSIS — I10 Essential (primary) hypertension: Secondary | ICD-10-CM | POA: Diagnosis not present

## 2018-03-02 DIAGNOSIS — K589 Irritable bowel syndrome without diarrhea: Secondary | ICD-10-CM | POA: Diagnosis not present

## 2018-03-02 DIAGNOSIS — Z0000 Encounter for general adult medical examination without abnormal findings: Secondary | ICD-10-CM | POA: Diagnosis not present

## 2018-03-02 DIAGNOSIS — J302 Other seasonal allergic rhinitis: Secondary | ICD-10-CM | POA: Diagnosis not present

## 2018-03-02 LAB — URINE CULTURE: Culture: >=100000 — AB

## 2018-03-03 ENCOUNTER — Telehealth: Payer: Self-pay | Admitting: Emergency Medicine

## 2018-03-03 NOTE — Telephone Encounter (Signed)
Post ED Visit - Positive Culture Follow-up: Unsuccessful Patient Follow-up  Culture assessed and recommendations reviewed by:  []  Elenor Quinones, Pharm.D. []  Heide Guile, Pharm.D., BCPS AQ-ID []  Parks Neptune, Pharm.D., BCPS []  Alycia Rossetti, Pharm.D., BCPS []  [place], [place].D., BCPS, AAHIVP []  Legrand Como, Pharm.D., BCPS, AAHIVP []  Wynell Balloon, PharmD [x]  Vertis Kelch, PharmD, BCPS  Positive Urine culture  [x]  Patient discharged without antimicrobial prescription and treatment is now indicated []  Organism is resistant to prescribed ED discharge antimicrobial []  Patient with positive blood cultures    Unable to contact patient, letter will be sent to address on file  Georgina Peer 03/03/2018, 4:15 PM

## 2018-03-03 NOTE — Progress Notes (Signed)
ED Antimicrobial Stewardship Positive Culture Follow Up   [name] is an 72 y.o. female who presented to [facility] on 02/26/2018 with a chief complaint of  Chief Complaint  Patient presents with  . Emesis    Recent Results (from the past 720 hour(s))  Urine culture     Status: Abnormal   Collection Time: 02/26/18  3:34 PM  Result Value Ref Range Status   Specimen Description   Final    URINE, CLEAN CATCH Performed at [facility], [address] [place], [place] [address]    Special Requests   Final    NONE Performed at [facility], [address], [place], [place] [address]    Culture (A)  Final    >=100,000 COLONIES/mL KLEBSIELLA PNEUMONIAE >=100,000 COLONIES/mL STREPTOCOCCUS GALLOLYTICUS    Report Status 03/02/2018 FINAL  Final   Organism ID, Bacteria KLEBSIELLA PNEUMONIAE (A)  Final   Organism ID, Bacteria STREPTOCOCCUS GALLOLYTICUS (A)  Final      Susceptibility   Klebsiella pneumoniae - MIC*    AMPICILLIN >=32 RESISTANT Resistant     CEFAZOLIN <=4 SENSITIVE Sensitive     CEFTRIAXONE <=1 SENSITIVE Sensitive     CIPROFLOXACIN <=0.25 SENSITIVE Sensitive     GENTAMICIN <=1 SENSITIVE Sensitive     IMIPENEM <=0.25 SENSITIVE Sensitive     NITROFURANTOIN <=16 SENSITIVE Sensitive     TRIMETH/SULFA <=20 SENSITIVE Sensitive     AMPICILLIN/SULBACTAM 4 SENSITIVE Sensitive     PIP/TAZO <=4 SENSITIVE Sensitive     Extended ESBL NEGATIVE Sensitive     * >=100,000 COLONIES/mL KLEBSIELLA PNEUMONIAE   Streptococcus gallolyticus - MIC*    PENICILLIN 0.12 SENSITIVE Sensitive     CEFTRIAXONE 0.25 SENSITIVE Sensitive     ERYTHROMYCIN >=8 RESISTANT Resistant     LEVOFLOXACIN 4 INTERMEDIATE Intermediate     VANCOMYCIN 0.25 SENSITIVE Sensitive     * >=100,000 COLONIES/mL STREPTOCOCCUS GALLOLYTICUS    Recommend to call patient for symptom check (nausea, vomiting, frequency, fevers). If improved, no treatment necessary. If worsened or the same, recommend treatment with Augmentin  875mg  PO BID x 14 days for early detected pyelonephritis.   ED Provider: Shelby Dubin   Cleotis Lema 03/03/2018, 9:46 AM PharmD Candidate Monday - Friday phone -  [phone] Saturday - Sunday phone - [phone]

## 2018-03-17 DIAGNOSIS — E039 Hypothyroidism, unspecified: Secondary | ICD-10-CM | POA: Diagnosis not present

## 2018-03-17 DIAGNOSIS — I10 Essential (primary) hypertension: Secondary | ICD-10-CM | POA: Diagnosis not present

## 2018-03-17 DIAGNOSIS — E1142 Type 2 diabetes mellitus with diabetic polyneuropathy: Secondary | ICD-10-CM | POA: Diagnosis not present

## 2018-03-17 DIAGNOSIS — M069 Rheumatoid arthritis, unspecified: Secondary | ICD-10-CM | POA: Diagnosis not present

## 2018-03-17 DIAGNOSIS — E782 Mixed hyperlipidemia: Secondary | ICD-10-CM | POA: Diagnosis not present

## 2018-03-20 DIAGNOSIS — E782 Mixed hyperlipidemia: Secondary | ICD-10-CM | POA: Diagnosis not present

## 2018-03-20 DIAGNOSIS — K219 Gastro-esophageal reflux disease without esophagitis: Secondary | ICD-10-CM | POA: Diagnosis not present

## 2018-03-20 DIAGNOSIS — I10 Essential (primary) hypertension: Secondary | ICD-10-CM | POA: Diagnosis not present

## 2018-03-20 DIAGNOSIS — E1169 Type 2 diabetes mellitus with other specified complication: Secondary | ICD-10-CM | POA: Diagnosis not present

## 2018-03-20 DIAGNOSIS — K580 Irritable bowel syndrome with diarrhea: Secondary | ICD-10-CM | POA: Diagnosis not present

## 2018-03-21 ENCOUNTER — Other Ambulatory Visit: Payer: Self-pay

## 2018-03-21 NOTE — Patient Outreach (Signed)
[facility]) Care Management  03/21/2018  [name] 22-Feb-1946 [id number]   Medication Adherence call to [name] left a message for patient to call back patient is due on Atorvastatin 40 mg. [name] is showing past due under [facility].   [facility] Management Direct Dial [phone]  Fax [phone] [email or web address]

## 2018-04-03 ENCOUNTER — Telehealth: Payer: Self-pay | Admitting: Emergency Medicine

## 2018-04-03 NOTE — Telephone Encounter (Signed)
Lost to followup 

## 2018-04-26 ENCOUNTER — Other Ambulatory Visit: Payer: Self-pay | Admitting: Gastroenterology

## 2018-05-09 DIAGNOSIS — E039 Hypothyroidism, unspecified: Secondary | ICD-10-CM | POA: Diagnosis not present

## 2018-06-18 ENCOUNTER — Observation Stay (HOSPITAL_COMMUNITY)
Admission: EM | Admit: 2018-06-18 | Discharge: 2018-06-19 | Disposition: A | Discharge status: Home / Self Care | Payer: Medicare Other | Attending: Internal Medicine | Admitting: Internal Medicine

## 2018-06-18 ENCOUNTER — Encounter (HOSPITAL_COMMUNITY): Payer: Self-pay | Admitting: Emergency Medicine

## 2018-06-18 ENCOUNTER — Emergency Department (HOSPITAL_COMMUNITY): Payer: Medicare Other

## 2018-06-18 ENCOUNTER — Other Ambulatory Visit: Payer: Self-pay

## 2018-06-18 DIAGNOSIS — E669 Obesity, unspecified: Secondary | ICD-10-CM | POA: Insufficient documentation

## 2018-06-18 DIAGNOSIS — Z853 Personal history of malignant neoplasm of breast: Secondary | ICD-10-CM | POA: Insufficient documentation

## 2018-06-18 DIAGNOSIS — R5381 Other malaise: Secondary | ICD-10-CM | POA: Diagnosis not present

## 2018-06-18 DIAGNOSIS — E66811 Obesity, class 1: Secondary | ICD-10-CM | POA: Diagnosis present

## 2018-06-18 DIAGNOSIS — Z87891 Personal history of nicotine dependence: Secondary | ICD-10-CM | POA: Insufficient documentation

## 2018-06-18 DIAGNOSIS — N390 Urinary tract infection, site not specified: Secondary | ICD-10-CM | POA: Diagnosis not present

## 2018-06-18 DIAGNOSIS — J209 Acute bronchitis, unspecified: Secondary | ICD-10-CM | POA: Diagnosis present

## 2018-06-18 DIAGNOSIS — E039 Hypothyroidism, unspecified: Secondary | ICD-10-CM | POA: Diagnosis present

## 2018-06-18 DIAGNOSIS — R0902 Hypoxemia: Secondary | ICD-10-CM | POA: Insufficient documentation

## 2018-06-18 DIAGNOSIS — R1010 Upper abdominal pain, unspecified: Secondary | ICD-10-CM | POA: Diagnosis present

## 2018-06-18 DIAGNOSIS — R05 Cough: Secondary | ICD-10-CM | POA: Diagnosis not present

## 2018-06-18 DIAGNOSIS — N183 Chronic kidney disease, stage 3 unspecified: Secondary | ICD-10-CM | POA: Diagnosis present

## 2018-06-18 DIAGNOSIS — E1122 Type 2 diabetes mellitus with diabetic chronic kidney disease: Secondary | ICD-10-CM | POA: Insufficient documentation

## 2018-06-18 DIAGNOSIS — Z7982 Long term (current) use of aspirin: Secondary | ICD-10-CM | POA: Insufficient documentation

## 2018-06-18 DIAGNOSIS — R7989 Other specified abnormal findings of blood chemistry: Secondary | ICD-10-CM

## 2018-06-18 DIAGNOSIS — Z79899 Other long term (current) drug therapy: Secondary | ICD-10-CM | POA: Diagnosis not present

## 2018-06-18 DIAGNOSIS — J449 Chronic obstructive pulmonary disease, unspecified: Secondary | ICD-10-CM | POA: Insufficient documentation

## 2018-06-18 DIAGNOSIS — I10 Essential (primary) hypertension: Secondary | ICD-10-CM | POA: Diagnosis present

## 2018-06-18 DIAGNOSIS — R509 Fever, unspecified: Secondary | ICD-10-CM | POA: Diagnosis not present

## 2018-06-18 DIAGNOSIS — I129 Hypertensive chronic kidney disease with stage 1 through stage 4 chronic kidney disease, or unspecified chronic kidney disease: Secondary | ICD-10-CM | POA: Insufficient documentation

## 2018-06-18 DIAGNOSIS — K219 Gastro-esophageal reflux disease without esophagitis: Secondary | ICD-10-CM | POA: Diagnosis not present

## 2018-06-18 DIAGNOSIS — R778 Other specified abnormalities of plasma proteins: Secondary | ICD-10-CM

## 2018-06-18 DIAGNOSIS — J40 Bronchitis, not specified as acute or chronic: Secondary | ICD-10-CM | POA: Diagnosis not present

## 2018-06-18 DIAGNOSIS — E119 Type 2 diabetes mellitus without complications: Secondary | ICD-10-CM

## 2018-06-18 LAB — CBC WITH DIFFERENTIAL/PLATELET
Abs Immature Granulocytes: 0.02 10*3/uL (ref 0.00–0.07)
BASOS PCT: 0 %
Basophils Absolute: 0 10*3/uL (ref 0.0–0.1)
Eosinophils Absolute: 0.1 10*3/uL (ref 0.0–0.5)
Eosinophils Relative: 1 %
HCT: 39.2 % (ref 36.0–46.0)
Hemoglobin: 11.9 g/dL — ABNORMAL LOW (ref 12.0–15.0)
Immature Granulocytes: 0 %
Lymphocytes Relative: 18 %
Lymphs Abs: 1.5 10*3/uL (ref 0.7–4.0)
MCH: 29.5 pg (ref 26.0–34.0)
MCHC: 30.4 g/dL (ref 30.0–36.0)
MCV: 97.3 fL (ref 80.0–100.0)
Monocytes Absolute: 0.7 10*3/uL (ref 0.1–1.0)
Monocytes Relative: 9 %
Neutro Abs: 6.1 10*3/uL (ref 1.7–7.7)
Neutrophils Relative %: 72 %
PLATELETS: 158 10*3/uL (ref 150–400)
RBC: 4.03 MIL/uL (ref 3.87–5.11)
RDW: 19.9 % — ABNORMAL HIGH (ref 11.5–15.5)
WBC: 8.5 10*3/uL (ref 4.0–10.5)
nRBC: 0 % (ref 0.0–0.2)

## 2018-06-18 LAB — GLUCOSE, CAPILLARY: Glucose-Capillary: 93 mg/dL (ref 70–99)

## 2018-06-18 LAB — COMPREHENSIVE METABOLIC PANEL
ALT: 18 U/L (ref 0–44)
ANION GAP: 8 (ref 5–15)
AST: 32 U/L (ref 15–41)
Albumin: 3.7 g/dL (ref 3.5–5.0)
Alkaline Phosphatase: 56 U/L (ref 38–126)
BUN: 13 mg/dL (ref 8–23)
CHLORIDE: 107 mmol/L (ref 98–111)
CO2: 19 mmol/L — ABNORMAL LOW (ref 22–32)
Calcium: 8.6 mg/dL — ABNORMAL LOW (ref 8.9–10.3)
Creatinine, Ser: 1.01 mg/dL — ABNORMAL HIGH (ref 0.44–1.00)
GFR calc Af Amer: >60 mL/min (ref >60)
GFR calc non Af Amer: 56 mL/min — ABNORMAL LOW (ref >60)
Glucose, Bld: 130 mg/dL — ABNORMAL HIGH (ref 70–99)
Potassium: 3.9 mmol/L (ref 3.5–5.1)
Sodium: 134 mmol/L — ABNORMAL LOW (ref 135–145)
Total Bilirubin: 1 mg/dL (ref 0.3–1.2)
Total Protein: 9.9 g/dL — ABNORMAL HIGH (ref 6.5–8.1)

## 2018-06-18 LAB — URINALYSIS, ROUTINE W REFLEX MICROSCOPIC
Bilirubin Urine: NEGATIVE
Glucose, UA: NEGATIVE mg/dL
Ketones, ur: NEGATIVE mg/dL
NITRITE: POSITIVE — AB
PH: 6 (ref 5.0–8.0)
Protein, ur: 30 mg/dL — AB
RBC / HPF: >50 RBC/hpf — ABNORMAL HIGH (ref 0–5)
Specific Gravity, Urine: 1.019 (ref 1.005–1.030)

## 2018-06-18 LAB — INFLUENZA PANEL BY PCR (TYPE A & B)
Influenza A By PCR: NEGATIVE
Influenza B By PCR: NEGATIVE

## 2018-06-18 LAB — CBG MONITORING, ED: Glucose-Capillary: 102 mg/dL — ABNORMAL HIGH (ref 70–99)

## 2018-06-18 LAB — TROPONIN I
Troponin I: 0.04 ng/mL (ref <0.03)
Troponin I: 0.04 ng/mL (ref <0.03)
Troponin I: 0.04 ng/mL (ref <0.03)

## 2018-06-18 LAB — GROUP A STREP BY PCR: Group A Strep by PCR: NOT DETECTED

## 2018-06-18 LAB — LACTIC ACID, PLASMA: Lactic Acid, Venous: 1.3 mmol/L (ref 0.5–1.9)

## 2018-06-18 LAB — LIPASE, BLOOD: Lipase: 30 U/L (ref 11–51)

## 2018-06-18 MED ORDER — BUDESONIDE 0.25 MG/2ML IN SUSP
0.2500 mg | Freq: Two times a day (BID) | RESPIRATORY_TRACT | Status: DC
  Administered 2018-06-18 – 2018-06-19 (×2): 0.25 mg via RESPIRATORY_TRACT
  Filled 2018-06-18 (×4): qty 2

## 2018-06-18 MED ORDER — ONDANSETRON HCL 4 MG PO TABS: 4.0000 mg | ORAL_TABLET | Freq: Four times a day (QID) | ORAL | Status: DC | PRN

## 2018-06-18 MED ORDER — AMITRIPTYLINE HCL 25 MG PO TABS
50.0000 mg | ORAL_TABLET | Freq: Every day | ORAL | Status: DC
  Administered 2018-06-18: 50 mg via ORAL
  Filled 2018-06-18: qty 2
  Filled 2018-06-18: qty 1

## 2018-06-18 MED ORDER — GLIPIZIDE ER 5 MG PO TB24
5.0000 mg | ORAL_TABLET | Freq: Every day | ORAL | Status: DC
  Filled 2018-06-18: qty 1

## 2018-06-18 MED ORDER — SODIUM CHLORIDE 0.9 % IV SOLN
1.0000 g | INTRAVENOUS | Status: DC
  Filled 2018-06-18 (×2): qty 10

## 2018-06-18 MED ORDER — LOSARTAN POTASSIUM 25 MG PO TABS: 100.0000 mg | ORAL_TABLET | Freq: Every day | ORAL | Status: DC

## 2018-06-18 MED ORDER — ENOXAPARIN SODIUM 40 MG/0.4ML ~~LOC~~ SOLN: 40.0000 mg | SUBCUTANEOUS | Status: DC

## 2018-06-18 MED ORDER — ONDANSETRON HCL 4 MG/2ML IJ SOLN: 4.0000 mg | INTRAMUSCULAR | Status: DC | PRN

## 2018-06-18 MED ORDER — DICYCLOMINE HCL 10 MG PO CAPS
10.0000 mg | ORAL_CAPSULE | Freq: Three times a day (TID) | ORAL | Status: DC
  Administered 2018-06-18 – 2018-06-19 (×3): 10 mg via ORAL
  Filled 2018-06-18 (×3): qty 1

## 2018-06-18 MED ORDER — SODIUM CHLORIDE 0.9% FLUSH: 3.0000 mL | INTRAVENOUS | Status: DC | PRN

## 2018-06-18 MED ORDER — GABAPENTIN 300 MG PO CAPS
600.0000 mg | ORAL_CAPSULE | Freq: Every day | ORAL | Status: DC
  Administered 2018-06-18: 600 mg via ORAL
  Filled 2018-06-18: qty 2

## 2018-06-18 MED ORDER — SODIUM CHLORIDE 0.9 % IV SOLN
1.0000 g | Freq: Once | INTRAVENOUS | Status: AC
  Administered 2018-06-18: 1 g via INTRAVENOUS
  Filled 2018-06-18: qty 10

## 2018-06-18 MED ORDER — DIAZEPAM 5 MG PO TABS: 5.0000 mg | ORAL_TABLET | Freq: Two times a day (BID) | ORAL | Status: DC | PRN

## 2018-06-18 MED ORDER — LIDOCAINE 5 % EX PTCH
1.0000 | MEDICATED_PATCH | Freq: Every day | CUTANEOUS | Status: DC | PRN
  Filled 2018-06-18: qty 1

## 2018-06-18 MED ORDER — LOSARTAN POTASSIUM-HCTZ 100-12.5 MG PO TABS: 1.0000 | ORAL_TABLET | Freq: Every day | ORAL | Status: DC

## 2018-06-18 MED ORDER — ACETAMINOPHEN 325 MG PO TABS
650.0000 mg | ORAL_TABLET | Freq: Four times a day (QID) | ORAL | Status: DC | PRN
  Administered 2018-06-18: 650 mg via ORAL
  Filled 2018-06-18: qty 2

## 2018-06-18 MED ORDER — ONDANSETRON HCL 4 MG/2ML IJ SOLN: 4.0000 mg | Freq: Four times a day (QID) | INTRAMUSCULAR | Status: DC | PRN

## 2018-06-18 MED ORDER — ALBUTEROL SULFATE (2.5 MG/3ML) 0.083% IN NEBU
2.5000 mg | INHALATION_SOLUTION | Freq: Once | RESPIRATORY_TRACT | Status: AC
  Administered 2018-06-18: 2.5 mg via RESPIRATORY_TRACT
  Filled 2018-06-18: qty 3

## 2018-06-18 MED ORDER — IPRATROPIUM-ALBUTEROL 0.5-2.5 (3) MG/3ML IN SOLN
3.0000 mL | Freq: Once | RESPIRATORY_TRACT | Status: AC
  Administered 2018-06-18: 3 mL via RESPIRATORY_TRACT
  Filled 2018-06-18: qty 3

## 2018-06-18 MED ORDER — METOPROLOL SUCCINATE ER 50 MG PO TB24
50.0000 mg | ORAL_TABLET | Freq: Every day | ORAL | Status: DC
  Administered 2018-06-19: 50 mg via ORAL
  Filled 2018-06-18: qty 1

## 2018-06-18 MED ORDER — INSULIN ASPART 100 UNIT/ML ~~LOC~~ SOLN: 0.0000 [IU] | Freq: Every day | SUBCUTANEOUS | Status: DC

## 2018-06-18 MED ORDER — AMLODIPINE BESYLATE 5 MG PO TABS
10.0000 mg | ORAL_TABLET | Freq: Every morning | ORAL | Status: DC
  Administered 2018-06-19: 10 mg via ORAL
  Filled 2018-06-18: qty 2

## 2018-06-18 MED ORDER — HYDROCHLOROTHIAZIDE 12.5 MG PO CAPS
12.5000 mg | ORAL_CAPSULE | Freq: Every day | ORAL | Status: DC
  Administered 2018-06-18 – 2018-06-19 (×2): 12.5 mg via ORAL
  Filled 2018-06-18 (×3): qty 1

## 2018-06-18 MED ORDER — LEVOTHYROXINE SODIUM 100 MCG PO TABS
100.0000 ug | ORAL_TABLET | Freq: Every day | ORAL | Status: DC
  Administered 2018-06-19: 100 ug via ORAL
  Filled 2018-06-18: qty 1

## 2018-06-18 MED ORDER — SODIUM CHLORIDE 0.9 % IV SOLN: 250.0000 mL | INTRAVENOUS | Status: DC | PRN

## 2018-06-18 MED ORDER — ACETAMINOPHEN 650 MG RE SUPP: 650.0000 mg | Freq: Four times a day (QID) | RECTAL | Status: DC | PRN

## 2018-06-18 MED ORDER — BUDESONIDE 0.25 MG/2ML IN SUSP
0.2500 mg | Freq: Two times a day (BID) | RESPIRATORY_TRACT | Status: DC
  Filled 2018-06-18 (×2): qty 2

## 2018-06-18 MED ORDER — LOSARTAN POTASSIUM 50 MG PO TABS
100.0000 mg | ORAL_TABLET | Freq: Every day | ORAL | Status: DC
  Administered 2018-06-19: 100 mg via ORAL
  Filled 2018-06-18: qty 2

## 2018-06-18 MED ORDER — PREDNISONE 20 MG PO TABS
40.0000 mg | ORAL_TABLET | Freq: Every day | ORAL | Status: DC
  Administered 2018-06-19: 40 mg via ORAL
  Filled 2018-06-18: qty 2

## 2018-06-18 MED ORDER — DM-GUAIFENESIN ER 30-600 MG PO TB12
1.0000 | ORAL_TABLET | Freq: Two times a day (BID) | ORAL | Status: DC
  Administered 2018-06-18 – 2018-06-19 (×2): 1 via ORAL
  Filled 2018-06-18 (×2): qty 1

## 2018-06-18 MED ORDER — HYDROCHLOROTHIAZIDE 12.5 MG PO CAPS: 12.5000 mg | ORAL_CAPSULE | Freq: Every day | ORAL | Status: DC

## 2018-06-18 MED ORDER — ASPIRIN 81 MG PO CHEW
81.0000 mg | CHEWABLE_TABLET | Freq: Every day | ORAL | Status: DC
  Administered 2018-06-18: 81 mg via ORAL
  Filled 2018-06-18: qty 1

## 2018-06-18 MED ORDER — INSULIN ASPART 100 UNIT/ML ~~LOC~~ SOLN: 0.0000 [IU] | Freq: Three times a day (TID) | SUBCUTANEOUS | Status: DC

## 2018-06-18 MED ORDER — IPRATROPIUM-ALBUTEROL 0.5-2.5 (3) MG/3ML IN SOLN: 3.0000 mL | Freq: Four times a day (QID) | RESPIRATORY_TRACT | Status: DC | PRN

## 2018-06-18 MED ORDER — SODIUM CHLORIDE 0.9% FLUSH
3.0000 mL | Freq: Two times a day (BID) | INTRAVENOUS | Status: DC
  Administered 2018-06-18 – 2018-06-19 (×2): 3 mL via INTRAVENOUS

## 2018-06-18 MED ORDER — ATORVASTATIN CALCIUM 40 MG PO TABS: 40.0000 mg | ORAL_TABLET | Freq: Every evening | ORAL | Status: DC

## 2018-06-18 MED ORDER — PANTOPRAZOLE SODIUM 40 MG PO TBEC
40.0000 mg | DELAYED_RELEASE_TABLET | Freq: Two times a day (BID) | ORAL | Status: DC
  Administered 2018-06-18 – 2018-06-19 (×2): 40 mg via ORAL
  Filled 2018-06-18 (×2): qty 1

## 2018-06-18 MED ORDER — VITAMIN B-12 1000 MCG PO TABS
500.0000 ug | ORAL_TABLET | Freq: Every day | ORAL | Status: DC
  Administered 2018-06-18 – 2018-06-19 (×2): 500 ug via ORAL
  Filled 2018-06-18 (×2): qty 0.5
  Filled 2018-06-18 (×2): qty 1

## 2018-06-18 NOTE — ED Notes (Signed)
RT notified for neb tx. 

## 2018-06-18 NOTE — ED Notes (Signed)
Pt returned from xray

## 2018-06-18 NOTE — Progress Notes (Signed)
Pt to room 316.

## 2018-06-18 NOTE — ED Notes (Signed)
Date and time results received: 06/18/18 8:41 AM  (use smartphrase ".now" to insert current time)  Test: trop Critical Value: 0.04  Name of Provider Notified: Thurnell Garbe  Orders Received? Or Actions Taken?: Orders Received - See Orders for details

## 2018-06-18 NOTE — ED Notes (Signed)
Phlebotomy at bedside.

## 2018-06-18 NOTE — ED Triage Notes (Signed)
Per EMS, pt reports fever,runny/nose, cough, abd pain, nausea x2 days. Pt alert and oriented. nad noted. Pt reports frequent voiding last night. Pt denies diarrhea.
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
## 2018-06-18 NOTE — ED Notes (Signed)
EDP at bedside  

## 2018-06-19 DIAGNOSIS — J449 Chronic obstructive pulmonary disease, unspecified: Secondary | ICD-10-CM | POA: Diagnosis not present

## 2018-06-19 DIAGNOSIS — N390 Urinary tract infection, site not specified: Secondary | ICD-10-CM | POA: Diagnosis not present

## 2018-06-19 LAB — BASIC METABOLIC PANEL
Anion gap: 8 (ref 5–15)
BUN: 13 mg/dL (ref 8–23)
CO2: 24 mmol/L (ref 22–32)
Calcium: 8.5 mg/dL — ABNORMAL LOW (ref 8.9–10.3)
Chloride: 101 mmol/L (ref 98–111)
Creatinine, Ser: 0.88 mg/dL (ref 0.44–1.00)
GFR calc Af Amer: >60 mL/min (ref >60)
Glucose, Bld: 96 mg/dL (ref 70–99)
Potassium: 2.8 mmol/L — ABNORMAL LOW (ref 3.5–5.1)
SODIUM: 133 mmol/L — AB (ref 135–145)

## 2018-06-19 LAB — CBC
HCT: 36.6 % (ref 36.0–46.0)
Hemoglobin: 11.4 g/dL — ABNORMAL LOW (ref 12.0–15.0)
MCH: 30.3 pg (ref 26.0–34.0)
MCHC: 31.1 g/dL (ref 30.0–36.0)
MCV: 97.3 fL (ref 80.0–100.0)
Platelets: 154 10*3/uL (ref 150–400)
RBC: 3.76 MIL/uL — ABNORMAL LOW (ref 3.87–5.11)
RDW: 19.5 % — AB (ref 11.5–15.5)
WBC: 6.2 10*3/uL (ref 4.0–10.5)
nRBC: 0 % (ref 0.0–0.2)

## 2018-06-19 LAB — GLUCOSE, CAPILLARY: Glucose-Capillary: 83 mg/dL (ref 70–99)

## 2018-06-19 LAB — HIV ANTIBODY (ROUTINE TESTING W REFLEX): HIV SCREEN 4TH GENERATION: NONREACTIVE

## 2018-06-19 MED ORDER — IPRATROPIUM-ALBUTEROL 20-100 MCG/ACT IN AERS: 1.0000 | INHALATION_SPRAY | Freq: Four times a day (QID) | RESPIRATORY_TRACT | 0 refills | Status: DC | PRN

## 2018-06-19 MED ORDER — CEFDINIR 300 MG PO CAPS: 300.0000 mg | ORAL_CAPSULE | Freq: Two times a day (BID) | ORAL | 0 refills | Status: AC

## 2018-06-19 MED ORDER — DM-GUAIFENESIN ER 30-600 MG PO TB12: 1.0000 | ORAL_TABLET | Freq: Two times a day (BID) | ORAL | 0 refills | Status: AC

## 2018-06-19 MED ORDER — PREDNISONE 20 MG PO TABS: 40.0000 mg | ORAL_TABLET | Freq: Every day | ORAL | 0 refills | Status: AC

## 2018-06-19 NOTE — Care Management Note (Signed)
Case Management Note  Patient Details  Name: [name] MRN: [id number] Date of Birth: 12-18-1945  Subjective/Objective:  Observation for UTI. Pt from home, lives with husband. Pt has insurance and PCP. Pt needs supplemental oxgyen. Pt and husband have chosen [facility] from [employer] provider list.                  Action/Plan: DC home today with home O2. Referral given to [place], [facility] rep.   Expected Discharge Date:  06/19/18               Expected Discharge Plan:  Home/Self Care  In-House Referral:  NA  Discharge planning Services  CM Consult  Post Acute Care Choice:  Durable Medical Equipment Choice offered to:  Patient  DME Arranged:  Oxygen DME Agency:  [facility].  Status of Service:  Completed, signed off  Sherald Barge, RN 06/19/2018, 1:21 PM

## 2018-06-19 NOTE — Progress Notes (Signed)
SATURATION QUALIFICATIONS: (This note is used to comply with regulatory documentation for home oxygen)  Patient Saturations on Room Air at Rest = 99%  Patient Saturations on Room Air while Ambulating = 88%  Patient Saturations on 2 Liters of oxygen while Ambulating = 95%  Please briefly explain why patient needs home oxygen:  Patient's oxygen saturation drops while ambulating without oxygen.

## 2018-06-19 NOTE — Progress Notes (Signed)
Ambulated pt. Around unit. Tolerated very well. Patient's oxygen sat was 99% while lying in bed. 87-88% while ambulating.
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
## 2018-06-19 NOTE — Care Management Obs Status (Signed)
[facility] NOTIFICATION   Patient Details  Name: [name] MRN: [id number] Date of Birth: 12-Oct-1945   Medicare Observation Status Notification Given:  Yes    Sherald Barge, RN 06/19/2018, 11:57 AM

## 2018-06-20 DIAGNOSIS — Z0000 Encounter for general adult medical examination without abnormal findings: Secondary | ICD-10-CM | POA: Diagnosis not present

## 2018-06-20 LAB — URINE CULTURE

## 2018-06-23 DIAGNOSIS — J449 Chronic obstructive pulmonary disease, unspecified: Secondary | ICD-10-CM | POA: Diagnosis not present
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
## 2018-07-03 IMAGING — CR DG CHEST 1V PORT
1 series · 1 of 1 positions shown · non-contrast
Comparison: 12/14/2016

CLINICAL DATA: Acute respiratory failure

EXAM:
PORTABLE CHEST 1 VIEW

[portable]
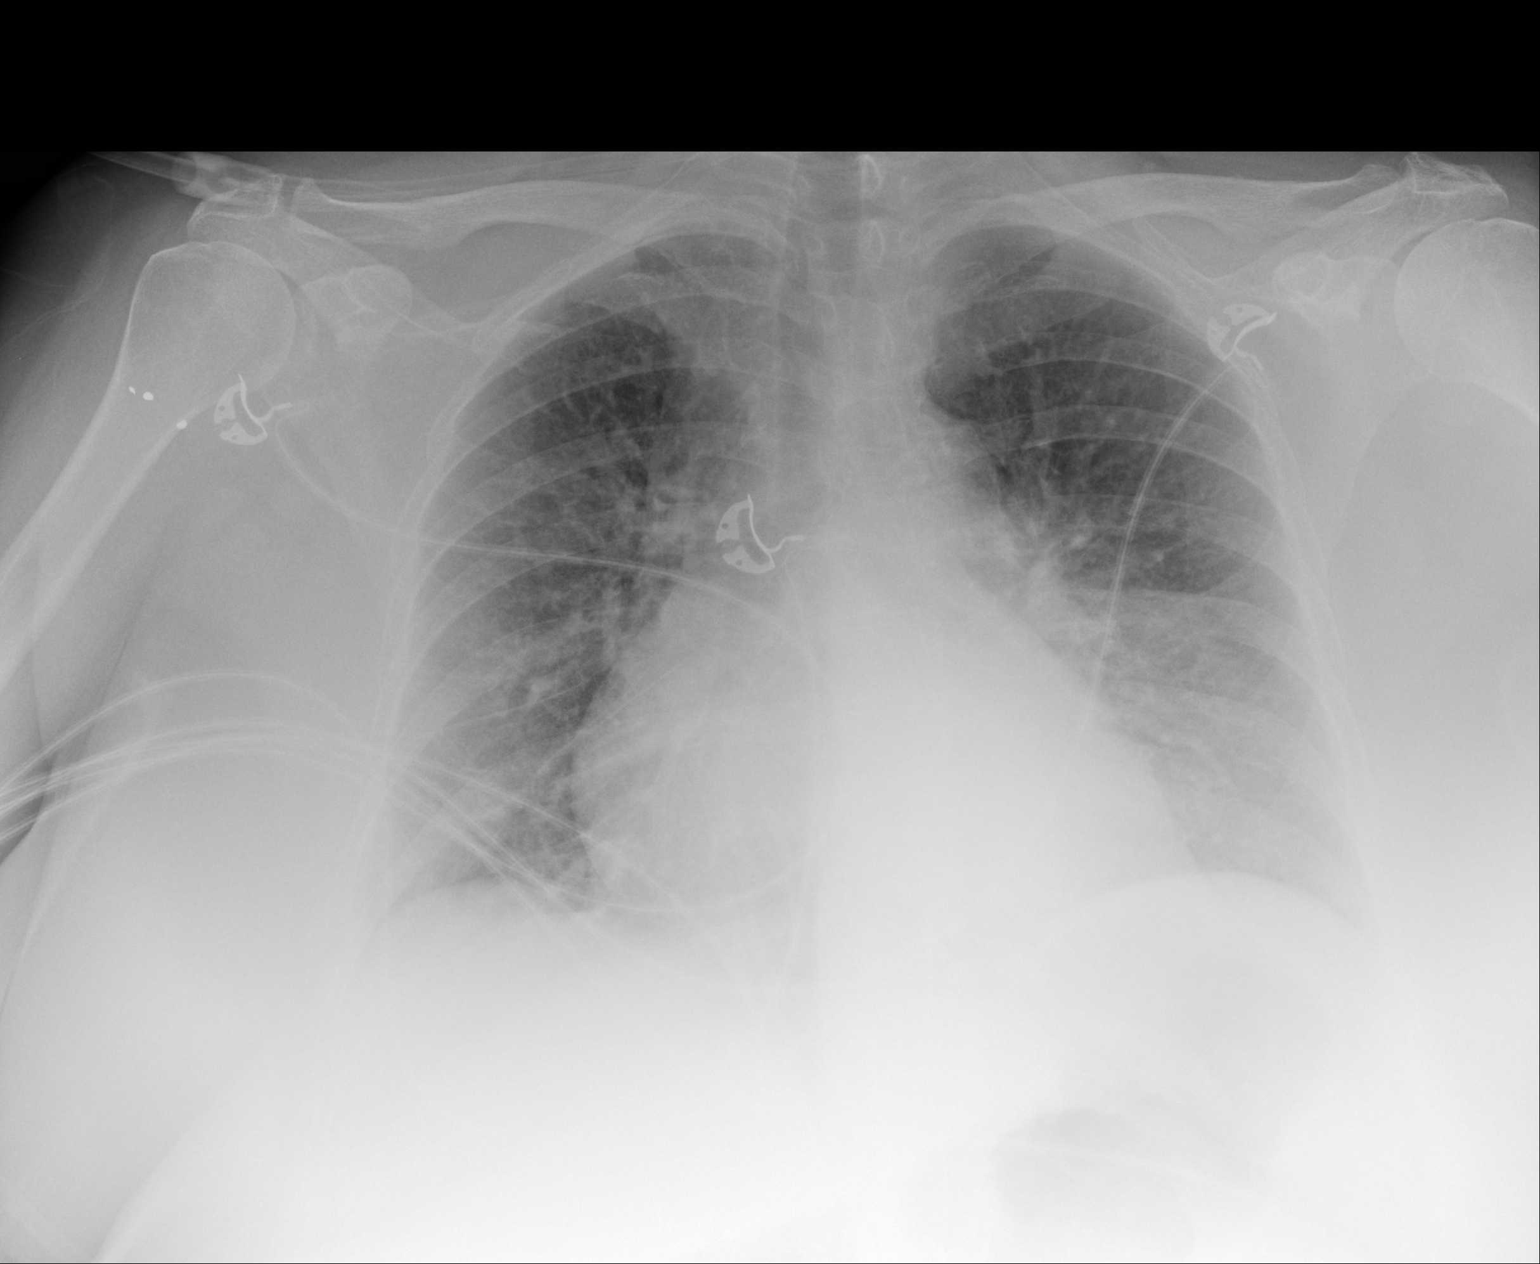

[1 of 1 positions shown; findings below may reference images not displayed]

FINDINGS: Cardiomegaly with vascular congestion and diffuse interstitial
opacities. There is asymmetric hazy density on the left without air
bronchogram, also seen on the 2 prior chest x-rays this year. No
effusion or pneumothorax.
IMPRESSION: 1. Mild CHF.
2. Asymmetric hazy density on the left could reflect pneumonia, but
the same appearance was also seen on 07/25/2016 radiograph. Followup
PA and lateral chest X-ray is recommended in 3-4 weeks to ensure
resolution. If a persisting opacity, outpatient chest CT may be
needed.

## 2018-07-05 LAB — GLUCOSE, CAPILLARY: Glucose-Capillary: 105 mg/dL — ABNORMAL HIGH (ref 70–99)

## 2018-07-07 DIAGNOSIS — J441 Chronic obstructive pulmonary disease with (acute) exacerbation: Secondary | ICD-10-CM | POA: Diagnosis not present

## 2018-07-07 DIAGNOSIS — R112 Nausea with vomiting, unspecified: Secondary | ICD-10-CM | POA: Diagnosis not present

## 2018-07-07 DIAGNOSIS — N390 Urinary tract infection, site not specified: Secondary | ICD-10-CM | POA: Diagnosis not present

## 2018-07-07 DIAGNOSIS — J060 Acute laryngopharyngitis: Secondary | ICD-10-CM | POA: Diagnosis not present

## 2018-07-07 DIAGNOSIS — J40 Bronchitis, not specified as acute or chronic: Secondary | ICD-10-CM | POA: Diagnosis not present

## 2018-07-21 ENCOUNTER — Other Ambulatory Visit: Payer: Self-pay | Admitting: Cardiology

## 2018-07-24 DIAGNOSIS — J449 Chronic obstructive pulmonary disease, unspecified: Secondary | ICD-10-CM | POA: Diagnosis not present

## 2018-07-26 DIAGNOSIS — K589 Irritable bowel syndrome without diarrhea: Secondary | ICD-10-CM | POA: Diagnosis not present

## 2018-07-26 DIAGNOSIS — I10 Essential (primary) hypertension: Secondary | ICD-10-CM | POA: Diagnosis not present

## 2018-07-26 DIAGNOSIS — J441 Chronic obstructive pulmonary disease with (acute) exacerbation: Secondary | ICD-10-CM | POA: Diagnosis not present

## 2018-07-26 DIAGNOSIS — D649 Anemia, unspecified: Secondary | ICD-10-CM | POA: Diagnosis not present

## 2018-07-28 ENCOUNTER — Telehealth: Payer: Self-pay | Admitting: Cardiology

## 2018-07-28 NOTE — Telephone Encounter (Signed)
Pt is changing pharmacies to [facility] - please update in the system

## 2018-07-28 NOTE — Telephone Encounter (Signed)
done

## 2018-07-31 ENCOUNTER — Telehealth: Payer: Self-pay

## 2018-07-31 DIAGNOSIS — K589 Irritable bowel syndrome without diarrhea: Secondary | ICD-10-CM | POA: Diagnosis not present

## 2018-07-31 DIAGNOSIS — J441 Chronic obstructive pulmonary disease with (acute) exacerbation: Secondary | ICD-10-CM | POA: Diagnosis not present

## 2018-07-31 DIAGNOSIS — D649 Anemia, unspecified: Secondary | ICD-10-CM | POA: Diagnosis not present

## 2018-07-31 DIAGNOSIS — I10 Essential (primary) hypertension: Secondary | ICD-10-CM | POA: Diagnosis not present

## 2018-07-31 NOTE — Telephone Encounter (Signed)
Received a fax from [facility] asking our office to send a copy of pts current medications. [facility] at [phone] and let them know that the pt was last seen at our office 07/2012 by AB and we wouldn't have any updated medication list for pt. They will let Dr. Nevada Crane know that the pt was last seen in 2014.

## 2018-08-10 DIAGNOSIS — B029 Zoster without complications: Secondary | ICD-10-CM | POA: Diagnosis not present

## 2018-08-10 DIAGNOSIS — E1169 Type 2 diabetes mellitus with other specified complication: Secondary | ICD-10-CM | POA: Diagnosis not present

## 2018-08-10 DIAGNOSIS — R319 Hematuria, unspecified: Secondary | ICD-10-CM | POA: Diagnosis not present

## 2018-08-10 DIAGNOSIS — B0223 Postherpetic polyneuropathy: Secondary | ICD-10-CM | POA: Diagnosis not present

## 2018-08-10 DIAGNOSIS — N390 Urinary tract infection, site not specified: Secondary | ICD-10-CM | POA: Diagnosis not present

## 2018-08-10 DIAGNOSIS — E039 Hypothyroidism, unspecified: Secondary | ICD-10-CM | POA: Diagnosis not present

## 2018-08-10 DIAGNOSIS — E1142 Type 2 diabetes mellitus with diabetic polyneuropathy: Secondary | ICD-10-CM | POA: Diagnosis not present

## 2018-08-22 DIAGNOSIS — J449 Chronic obstructive pulmonary disease, unspecified: Secondary | ICD-10-CM | POA: Diagnosis not present

## 2018-08-22 DIAGNOSIS — J95821 Acute postprocedural respiratory failure: Secondary | ICD-10-CM | POA: Diagnosis not present

## 2018-08-22 DIAGNOSIS — I2510 Atherosclerotic heart disease of native coronary artery without angina pectoris: Secondary | ICD-10-CM | POA: Diagnosis not present

## 2018-08-22 DIAGNOSIS — I5032 Chronic diastolic (congestive) heart failure: Secondary | ICD-10-CM | POA: Diagnosis not present

## 2018-09-06 DIAGNOSIS — J441 Chronic obstructive pulmonary disease with (acute) exacerbation: Secondary | ICD-10-CM | POA: Diagnosis not present

## 2018-09-06 DIAGNOSIS — I10 Essential (primary) hypertension: Secondary | ICD-10-CM | POA: Diagnosis not present

## 2018-09-06 DIAGNOSIS — K589 Irritable bowel syndrome without diarrhea: Secondary | ICD-10-CM | POA: Diagnosis not present

## 2018-09-06 DIAGNOSIS — D649 Anemia, unspecified: Secondary | ICD-10-CM | POA: Diagnosis not present

## 2018-09-22 DIAGNOSIS — J95821 Acute postprocedural respiratory failure: Secondary | ICD-10-CM | POA: Diagnosis not present

## 2018-09-22 DIAGNOSIS — I2510 Atherosclerotic heart disease of native coronary artery without angina pectoris: Secondary | ICD-10-CM | POA: Diagnosis not present

## 2018-09-22 DIAGNOSIS — J449 Chronic obstructive pulmonary disease, unspecified: Secondary | ICD-10-CM | POA: Diagnosis not present

## 2018-09-22 DIAGNOSIS — I5032 Chronic diastolic (congestive) heart failure: Secondary | ICD-10-CM | POA: Diagnosis not present

## 2018-10-03 ENCOUNTER — Other Ambulatory Visit: Payer: Self-pay

## 2018-10-03 NOTE — Patient Outreach (Signed)
[facility]) Care Management  10/03/2018  [name] Aug 21, 1945 [id number]   Medication Adherence call to [name] spoke with patient she explain she is taking what the pharmacy send patient is going to a new pharmacy which they are doing pill pack for her and  works better for her patient is blind .[name] is showing past due under [facility].   [facility] Management Direct Dial [phone]  Fax [phone] Mayes [email or web address]

## 2018-10-06 DIAGNOSIS — Z0000 Encounter for general adult medical examination without abnormal findings: Secondary | ICD-10-CM | POA: Diagnosis not present

## 2018-10-22 DIAGNOSIS — I5032 Chronic diastolic (congestive) heart failure: Secondary | ICD-10-CM | POA: Diagnosis not present

## 2018-10-22 DIAGNOSIS — J449 Chronic obstructive pulmonary disease, unspecified: Secondary | ICD-10-CM | POA: Diagnosis not present

## 2018-10-22 DIAGNOSIS — J95821 Acute postprocedural respiratory failure: Secondary | ICD-10-CM | POA: Diagnosis not present

## 2018-10-22 DIAGNOSIS — I2510 Atherosclerotic heart disease of native coronary artery without angina pectoris: Secondary | ICD-10-CM | POA: Diagnosis not present

## 2018-10-24 DIAGNOSIS — I10 Essential (primary) hypertension: Secondary | ICD-10-CM | POA: Diagnosis not present

## 2018-10-24 DIAGNOSIS — E1169 Type 2 diabetes mellitus with other specified complication: Secondary | ICD-10-CM | POA: Diagnosis not present

## 2018-10-24 DIAGNOSIS — E782 Mixed hyperlipidemia: Secondary | ICD-10-CM | POA: Diagnosis not present

## 2018-10-24 DIAGNOSIS — J441 Chronic obstructive pulmonary disease with (acute) exacerbation: Secondary | ICD-10-CM | POA: Diagnosis not present

## 2018-10-24 DIAGNOSIS — K219 Gastro-esophageal reflux disease without esophagitis: Secondary | ICD-10-CM | POA: Diagnosis not present

## 2018-10-25 ENCOUNTER — Telehealth: Payer: Self-pay | Admitting: Cardiology

## 2018-10-25 DIAGNOSIS — D649 Anemia, unspecified: Secondary | ICD-10-CM | POA: Diagnosis not present

## 2018-10-25 DIAGNOSIS — E039 Hypothyroidism, unspecified: Secondary | ICD-10-CM | POA: Diagnosis not present

## 2018-10-25 DIAGNOSIS — E1169 Type 2 diabetes mellitus with other specified complication: Secondary | ICD-10-CM | POA: Diagnosis not present

## 2018-10-25 DIAGNOSIS — I10 Essential (primary) hypertension: Secondary | ICD-10-CM | POA: Diagnosis not present

## 2018-10-25 NOTE — Telephone Encounter (Signed)
Virtual Visit Pre-Appointment Phone Call  "(Name), I am calling you today to discuss your upcoming appointment. We are currently trying to limit exposure to the virus that causes COVID-19 by seeing patients at home rather than in the office."  1. "What is the BEST phone number to call the day of the visit?" - include this in appointment notes  2. Do you have or have access to (through a family member/friend) a smartphone with video capability that we can use for your visit?" a. If yes - list this number in appt notes as cell (if different from BEST phone #) and list the appointment type as a VIDEO visit in appointment notes b. If no - list the appointment type as a PHONE visit in appointment notes  3. Confirm consent - "In the setting of the current Covid19 crisis, you are scheduled for a (phone or video) visit with your provider on (date) at (time).  Just as we do with many in-office visits, in order for you to participate in this visit, we must obtain consent.  If you'd like, I can send this to your mychart (if signed up) or email for you to review.  Otherwise, I can obtain your verbal consent now.  All virtual visits are billed to your insurance company just like a normal visit would be.  By agreeing to a virtual visit, we'd like you to understand that the technology does not allow for your provider to perform an examination, and thus may limit your provider's ability to fully assess your condition. If your provider identifies any concerns that need to be evaluated in person, we will make arrangements to do so.  Finally, though the technology is pretty good, we cannot assure that it will always work on either your or our end, and in the setting of a video visit, we may have to convert it to a phone-only visit.  In either situation, we cannot ensure that we have a secure connection.  Are you willing to proceed?" STAFF: Did the patient verbally acknowledge consent to telehealth visit? Document  YES/NO here: Yes  4. Advise patient to be prepared - "Two hours prior to your appointment, go ahead and check your blood pressure, pulse, oxygen saturation, and your weight (if you have the equipment to check those) and write them all down. When your visit starts, your provider will ask you for this information. If you have an Apple Watch or Kardia device, please plan to have heart rate information ready on the day of your appointment. Please have a pen and paper handy nearby the day of the visit as well."  5. Give patient instructions for MyChart download to smartphone OR [email or web address] as below if video visit (depending on what platform provider is using)  6. Inform patient they will receive a phone call 15 minutes prior to their appointment time (may be from unknown caller ID) so they should be prepared to answer    TELEPHONE CALL NOTE  [name] has been deemed a candidate for a follow-up tele-health visit to limit community exposure during the Covid-19 pandemic. I spoke with the patient via phone to ensure availability of phone/video source, confirm preferred email & phone number, and discuss instructions and expectations.  I reminded [name] to be prepared with any vital sign and/or heart rhythm information that could potentially be obtained via home monitoring, at the time of her visit. I reminded [name] to expect a phone call prior to  her visit.  Orinda Kenner 10/25/2018 3:49 PM

## 2018-10-30 ENCOUNTER — Encounter: Payer: Self-pay | Admitting: *Deleted

## 2018-10-30 ENCOUNTER — Other Ambulatory Visit: Payer: Self-pay

## 2018-10-30 ENCOUNTER — Telehealth (INDEPENDENT_AMBULATORY_CARE_PROVIDER_SITE_OTHER): Payer: Medicare Other | Admitting: Cardiology

## 2018-10-30 VITALS — Ht 61.0 in

## 2018-10-30 DIAGNOSIS — I10 Essential (primary) hypertension: Secondary | ICD-10-CM

## 2018-10-30 DIAGNOSIS — E782 Mixed hyperlipidemia: Secondary | ICD-10-CM

## 2018-10-30 DIAGNOSIS — I2510 Atherosclerotic heart disease of native coronary artery without angina pectoris: Secondary | ICD-10-CM | POA: Diagnosis not present

## 2018-10-30 NOTE — Patient Instructions (Signed)
Medication Instructions:  Your physician recommends that you continue on your current medications as directed. Please refer to the Current Medication list given to you today.   Labwork: NONE  Testing/Procedures: NONE  Follow-Up: Your physician wants you to follow-up in: 6 Months with Dr. Harl Bowie. You will receive a reminder letter in the mail two months in advance. If you don't receive a letter, please call our office to schedule the follow-up appointment.   Any Other Special Instructions Will Be Listed Below (If Applicable).  I will request labs from your PCP.   If you need a refill on your cardiac medications before your next appointment, please call your pharmacy.  Thank you for choosing [facility]!
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
## 2018-10-31 DIAGNOSIS — E1140 Type 2 diabetes mellitus with diabetic neuropathy, unspecified: Secondary | ICD-10-CM | POA: Diagnosis not present

## 2018-10-31 DIAGNOSIS — E782 Mixed hyperlipidemia: Secondary | ICD-10-CM | POA: Diagnosis not present

## 2018-10-31 DIAGNOSIS — E1169 Type 2 diabetes mellitus with other specified complication: Secondary | ICD-10-CM | POA: Diagnosis not present

## 2018-10-31 DIAGNOSIS — I10 Essential (primary) hypertension: Secondary | ICD-10-CM | POA: Diagnosis not present

## 2018-10-31 DIAGNOSIS — K219 Gastro-esophageal reflux disease without esophagitis: Secondary | ICD-10-CM | POA: Diagnosis not present

## 2018-11-07 DIAGNOSIS — D649 Anemia, unspecified: Secondary | ICD-10-CM | POA: Diagnosis not present

## 2018-11-07 DIAGNOSIS — J441 Chronic obstructive pulmonary disease with (acute) exacerbation: Secondary | ICD-10-CM | POA: Diagnosis not present

## 2018-11-07 DIAGNOSIS — I10 Essential (primary) hypertension: Secondary | ICD-10-CM | POA: Diagnosis not present

## 2018-11-07 DIAGNOSIS — K589 Irritable bowel syndrome without diarrhea: Secondary | ICD-10-CM | POA: Diagnosis not present

## 2018-11-22 DIAGNOSIS — J449 Chronic obstructive pulmonary disease, unspecified: Secondary | ICD-10-CM | POA: Diagnosis not present

## 2018-11-22 DIAGNOSIS — J95821 Acute postprocedural respiratory failure: Secondary | ICD-10-CM | POA: Diagnosis not present

## 2018-11-22 DIAGNOSIS — I2510 Atherosclerotic heart disease of native coronary artery without angina pectoris: Secondary | ICD-10-CM | POA: Diagnosis not present

## 2018-11-22 DIAGNOSIS — I5032 Chronic diastolic (congestive) heart failure: Secondary | ICD-10-CM | POA: Diagnosis not present

## 2018-11-30 DIAGNOSIS — D649 Anemia, unspecified: Secondary | ICD-10-CM | POA: Diagnosis not present

## 2018-11-30 DIAGNOSIS — I700 Atherosclerosis of aorta: Secondary | ICD-10-CM | POA: Diagnosis not present

## 2018-11-30 DIAGNOSIS — J441 Chronic obstructive pulmonary disease with (acute) exacerbation: Secondary | ICD-10-CM | POA: Diagnosis not present

## 2018-11-30 DIAGNOSIS — K589 Irritable bowel syndrome without diarrhea: Secondary | ICD-10-CM | POA: Diagnosis not present

## 2018-12-05 ENCOUNTER — Other Ambulatory Visit: Payer: Self-pay

## 2018-12-05 NOTE — Patient Outreach (Signed)
Outreach attempt for EMMI Prevent. Mailbox full.

## 2018-12-22 DIAGNOSIS — J449 Chronic obstructive pulmonary disease, unspecified: Secondary | ICD-10-CM | POA: Diagnosis not present

## 2018-12-22 DIAGNOSIS — J95821 Acute postprocedural respiratory failure: Secondary | ICD-10-CM | POA: Diagnosis not present

## 2018-12-22 DIAGNOSIS — I2510 Atherosclerotic heart disease of native coronary artery without angina pectoris: Secondary | ICD-10-CM | POA: Diagnosis not present

## 2018-12-22 DIAGNOSIS — I5032 Chronic diastolic (congestive) heart failure: Secondary | ICD-10-CM | POA: Diagnosis not present

## 2019-01-03 DIAGNOSIS — J441 Chronic obstructive pulmonary disease with (acute) exacerbation: Secondary | ICD-10-CM | POA: Diagnosis not present

## 2019-01-03 DIAGNOSIS — I10 Essential (primary) hypertension: Secondary | ICD-10-CM | POA: Diagnosis not present

## 2019-01-03 DIAGNOSIS — D649 Anemia, unspecified: Secondary | ICD-10-CM | POA: Diagnosis not present

## 2019-01-03 DIAGNOSIS — K589 Irritable bowel syndrome without diarrhea: Secondary | ICD-10-CM | POA: Diagnosis not present

## 2019-01-22 DIAGNOSIS — I5032 Chronic diastolic (congestive) heart failure: Secondary | ICD-10-CM | POA: Diagnosis not present

## 2019-01-22 DIAGNOSIS — J95821 Acute postprocedural respiratory failure: Secondary | ICD-10-CM | POA: Diagnosis not present

## 2019-01-22 DIAGNOSIS — J449 Chronic obstructive pulmonary disease, unspecified: Secondary | ICD-10-CM | POA: Diagnosis not present

## 2019-01-22 DIAGNOSIS — I2510 Atherosclerotic heart disease of native coronary artery without angina pectoris: Secondary | ICD-10-CM | POA: Diagnosis not present

## 2019-02-02 DIAGNOSIS — D649 Anemia, unspecified: Secondary | ICD-10-CM | POA: Diagnosis not present

## 2019-02-02 DIAGNOSIS — J441 Chronic obstructive pulmonary disease with (acute) exacerbation: Secondary | ICD-10-CM | POA: Diagnosis not present

## 2019-02-02 DIAGNOSIS — I700 Atherosclerosis of aorta: Secondary | ICD-10-CM | POA: Diagnosis not present

## 2019-02-02 DIAGNOSIS — I10 Essential (primary) hypertension: Secondary | ICD-10-CM | POA: Diagnosis not present

## 2019-02-02 DIAGNOSIS — K589 Irritable bowel syndrome without diarrhea: Secondary | ICD-10-CM | POA: Diagnosis not present

## 2019-02-03 ENCOUNTER — Emergency Department (HOSPITAL_COMMUNITY)
Admission: EM | Admit: 2019-02-03 | Discharge: 2019-02-03 | Disposition: A | Discharge status: Home / Self Care | Payer: Medicare Other | Attending: Emergency Medicine | Admitting: Emergency Medicine

## 2019-02-03 ENCOUNTER — Other Ambulatory Visit: Payer: Self-pay

## 2019-02-03 ENCOUNTER — Encounter (HOSPITAL_COMMUNITY): Payer: Self-pay | Admitting: Emergency Medicine

## 2019-02-03 ENCOUNTER — Emergency Department (HOSPITAL_COMMUNITY): Payer: Medicare Other

## 2019-02-03 DIAGNOSIS — S0990XA Unspecified injury of head, initial encounter: Secondary | ICD-10-CM | POA: Diagnosis not present

## 2019-02-03 DIAGNOSIS — Y92009 Unspecified place in unspecified non-institutional (private) residence as the place of occurrence of the external cause: Secondary | ICD-10-CM | POA: Diagnosis not present

## 2019-02-03 DIAGNOSIS — Z87891 Personal history of nicotine dependence: Secondary | ICD-10-CM | POA: Insufficient documentation

## 2019-02-03 DIAGNOSIS — M545 Low back pain, unspecified: Secondary | ICD-10-CM

## 2019-02-03 DIAGNOSIS — M546 Pain in thoracic spine: Secondary | ICD-10-CM | POA: Diagnosis not present

## 2019-02-03 DIAGNOSIS — S199XXA Unspecified injury of neck, initial encounter: Secondary | ICD-10-CM | POA: Diagnosis not present

## 2019-02-03 DIAGNOSIS — J449 Chronic obstructive pulmonary disease, unspecified: Secondary | ICD-10-CM | POA: Insufficient documentation

## 2019-02-03 DIAGNOSIS — W19XXXA Unspecified fall, initial encounter: Secondary | ICD-10-CM

## 2019-02-03 DIAGNOSIS — Y999 Unspecified external cause status: Secondary | ICD-10-CM | POA: Insufficient documentation

## 2019-02-03 DIAGNOSIS — S299XXA Unspecified injury of thorax, initial encounter: Secondary | ICD-10-CM | POA: Diagnosis not present

## 2019-02-03 DIAGNOSIS — I10 Essential (primary) hypertension: Secondary | ICD-10-CM | POA: Insufficient documentation

## 2019-02-03 DIAGNOSIS — E1165 Type 2 diabetes mellitus with hyperglycemia: Secondary | ICD-10-CM | POA: Insufficient documentation

## 2019-02-03 DIAGNOSIS — W1830XA Fall on same level, unspecified, initial encounter: Secondary | ICD-10-CM | POA: Diagnosis not present

## 2019-02-03 DIAGNOSIS — E039 Hypothyroidism, unspecified: Secondary | ICD-10-CM | POA: Diagnosis not present

## 2019-02-03 DIAGNOSIS — Y939 Activity, unspecified: Secondary | ICD-10-CM | POA: Diagnosis not present

## 2019-02-03 DIAGNOSIS — R0789 Other chest pain: Secondary | ICD-10-CM | POA: Insufficient documentation

## 2019-02-03 DIAGNOSIS — R410 Disorientation, unspecified: Secondary | ICD-10-CM | POA: Diagnosis not present

## 2019-02-03 DIAGNOSIS — R51 Headache: Secondary | ICD-10-CM | POA: Insufficient documentation

## 2019-02-03 LAB — CBG MONITORING, ED: Glucose-Capillary: 266 mg/dL — ABNORMAL HIGH (ref 70–99)

## 2019-02-03 MED ORDER — METHOCARBAMOL 500 MG PO TABS
1000.0000 mg | ORAL_TABLET | Freq: Once | ORAL | Status: AC
  Administered 2019-02-03: 1000 mg via ORAL
  Filled 2019-02-03: qty 2

## 2019-02-03 NOTE — ED Notes (Signed)
Pt 's husband went home to get pt some pants
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
## 2019-02-03 NOTE — Discharge Instructions (Addendum)
You were evaluated in the Emergency Department and after careful evaluation, we did not find any emergent condition requiring admission or further testing in the hospital.  Your exam/testing today was overall reassuring.  No broken bones.  We encourage you to rest and use Tylenol and ibuprofen at home for discomfort.  Please return to the Emergency Department if you experience any worsening of your condition.  We encourage you to follow up with a primary care provider.  Thank you for allowing [place] to be a part of your care.

## 2019-02-03 NOTE — ED Notes (Signed)
Pts husband phone number [phone]

## 2019-02-03 NOTE — ED Triage Notes (Signed)
Pt brought in from home after a fall around 3am. Her family was home, but pt did not want them to call. Pt states her back hurts from her fall.

## 2019-02-22 DIAGNOSIS — I2510 Atherosclerotic heart disease of native coronary artery without angina pectoris: Secondary | ICD-10-CM | POA: Diagnosis not present

## 2019-02-22 DIAGNOSIS — J449 Chronic obstructive pulmonary disease, unspecified: Secondary | ICD-10-CM | POA: Diagnosis not present

## 2019-02-22 DIAGNOSIS — J95821 Acute postprocedural respiratory failure: Secondary | ICD-10-CM | POA: Diagnosis not present

## 2019-02-22 DIAGNOSIS — I5032 Chronic diastolic (congestive) heart failure: Secondary | ICD-10-CM | POA: Diagnosis not present

## 2019-03-05 DIAGNOSIS — D649 Anemia, unspecified: Secondary | ICD-10-CM | POA: Diagnosis not present

## 2019-03-05 DIAGNOSIS — I10 Essential (primary) hypertension: Secondary | ICD-10-CM | POA: Diagnosis not present

## 2019-03-05 DIAGNOSIS — I700 Atherosclerosis of aorta: Secondary | ICD-10-CM | POA: Diagnosis not present

## 2019-03-05 DIAGNOSIS — J441 Chronic obstructive pulmonary disease with (acute) exacerbation: Secondary | ICD-10-CM | POA: Diagnosis not present

## 2019-03-05 DIAGNOSIS — K589 Irritable bowel syndrome without diarrhea: Secondary | ICD-10-CM | POA: Diagnosis not present

## 2019-03-14 ENCOUNTER — Other Ambulatory Visit: Payer: Self-pay

## 2019-03-14 NOTE — Patient Outreach (Signed)
[facility] [facility]) Care Management  03/14/2019  JAILEIGH TRACHTMAN 07-19-1945 [id number]   Medication Adherence call to Mrs. Chriss Driver [facility] Identifiers Verify spoke with patient she is past due on Glipizide Er 5 mg she explain she has stop taking this medication because she was having side effects,patient did said her blood sugar keeps going up,patient is blind,patient was a bit concern she has not received her pill pack for the month,patient ask to call the pharmacy to see why she has not received it,left a message at [employer].Mrs. [place] is showing past due under [place].   [place] Management Direct Dial [phone]  Fax [phone] [email or web address]

## 2019-03-24 DIAGNOSIS — J449 Chronic obstructive pulmonary disease, unspecified: Secondary | ICD-10-CM | POA: Diagnosis not present

## 2019-03-24 DIAGNOSIS — I2510 Atherosclerotic heart disease of native coronary artery without angina pectoris: Secondary | ICD-10-CM | POA: Diagnosis not present

## 2019-03-24 DIAGNOSIS — J95821 Acute postprocedural respiratory failure: Secondary | ICD-10-CM | POA: Diagnosis not present

## 2019-03-24 DIAGNOSIS — I5032 Chronic diastolic (congestive) heart failure: Secondary | ICD-10-CM | POA: Diagnosis not present

## 2019-04-20 DIAGNOSIS — I10 Essential (primary) hypertension: Secondary | ICD-10-CM | POA: Diagnosis not present

## 2019-04-20 DIAGNOSIS — J441 Chronic obstructive pulmonary disease with (acute) exacerbation: Secondary | ICD-10-CM | POA: Diagnosis not present

## 2019-04-20 DIAGNOSIS — K589 Irritable bowel syndrome without diarrhea: Secondary | ICD-10-CM | POA: Diagnosis not present

## 2019-04-24 DIAGNOSIS — I2510 Atherosclerotic heart disease of native coronary artery without angina pectoris: Secondary | ICD-10-CM | POA: Diagnosis not present

## 2019-04-24 DIAGNOSIS — J95821 Acute postprocedural respiratory failure: Secondary | ICD-10-CM | POA: Diagnosis not present

## 2019-04-24 DIAGNOSIS — J449 Chronic obstructive pulmonary disease, unspecified: Secondary | ICD-10-CM | POA: Diagnosis not present

## 2019-04-24 DIAGNOSIS — I5032 Chronic diastolic (congestive) heart failure: Secondary | ICD-10-CM | POA: Diagnosis not present

## 2019-05-02 ENCOUNTER — Other Ambulatory Visit: Payer: Self-pay

## 2019-05-02 NOTE — Patient Outreach (Signed)
[facility]) Care Management  05/02/2019  XOEY PEGLOW Jun 07, 1945 [id number]   Medication Adherence call to Mrs. [place] Compliant Voice message left with a call back number. Mrs. Lappin is showing past due on Glipizide 5 mg under [place].   [place] Management Direct Dial [phone]  Fax [phone] [email or web address]

## 2019-05-14 DIAGNOSIS — J441 Chronic obstructive pulmonary disease with (acute) exacerbation: Secondary | ICD-10-CM | POA: Diagnosis not present

## 2019-05-14 DIAGNOSIS — K589 Irritable bowel syndrome without diarrhea: Secondary | ICD-10-CM | POA: Diagnosis not present

## 2019-05-14 DIAGNOSIS — I10 Essential (primary) hypertension: Secondary | ICD-10-CM | POA: Diagnosis not present

## 2019-05-24 DIAGNOSIS — J449 Chronic obstructive pulmonary disease, unspecified: Secondary | ICD-10-CM | POA: Diagnosis not present

## 2019-05-24 DIAGNOSIS — J95821 Acute postprocedural respiratory failure: Secondary | ICD-10-CM | POA: Diagnosis not present

## 2019-05-24 DIAGNOSIS — I5032 Chronic diastolic (congestive) heart failure: Secondary | ICD-10-CM | POA: Diagnosis not present

## 2019-05-24 DIAGNOSIS — I2510 Atherosclerotic heart disease of native coronary artery without angina pectoris: Secondary | ICD-10-CM | POA: Diagnosis not present

## 2019-06-05 DIAGNOSIS — E1169 Type 2 diabetes mellitus with other specified complication: Secondary | ICD-10-CM | POA: Diagnosis not present

## 2019-06-05 DIAGNOSIS — I10 Essential (primary) hypertension: Secondary | ICD-10-CM | POA: Diagnosis not present

## 2019-06-05 DIAGNOSIS — J441 Chronic obstructive pulmonary disease with (acute) exacerbation: Secondary | ICD-10-CM | POA: Diagnosis not present

## 2019-06-05 DIAGNOSIS — D649 Anemia, unspecified: Secondary | ICD-10-CM | POA: Diagnosis not present

## 2019-06-05 DIAGNOSIS — E7849 Other hyperlipidemia: Secondary | ICD-10-CM | POA: Diagnosis not present

## 2019-06-24 DIAGNOSIS — I5032 Chronic diastolic (congestive) heart failure: Secondary | ICD-10-CM | POA: Diagnosis not present

## 2019-06-24 DIAGNOSIS — I2510 Atherosclerotic heart disease of native coronary artery without angina pectoris: Secondary | ICD-10-CM | POA: Diagnosis not present

## 2019-06-24 DIAGNOSIS — J95821 Acute postprocedural respiratory failure: Secondary | ICD-10-CM | POA: Diagnosis not present

## 2019-06-24 DIAGNOSIS — J449 Chronic obstructive pulmonary disease, unspecified: Secondary | ICD-10-CM | POA: Diagnosis not present

## 2019-07-04 DIAGNOSIS — E782 Mixed hyperlipidemia: Secondary | ICD-10-CM | POA: Diagnosis not present

## 2019-07-04 DIAGNOSIS — J441 Chronic obstructive pulmonary disease with (acute) exacerbation: Secondary | ICD-10-CM | POA: Diagnosis not present

## 2019-07-04 DIAGNOSIS — E7849 Other hyperlipidemia: Secondary | ICD-10-CM | POA: Diagnosis not present

## 2019-07-04 DIAGNOSIS — E1169 Type 2 diabetes mellitus with other specified complication: Secondary | ICD-10-CM | POA: Diagnosis not present

## 2019-07-04 DIAGNOSIS — I10 Essential (primary) hypertension: Secondary | ICD-10-CM | POA: Diagnosis not present

## 2019-07-25 DIAGNOSIS — J95821 Acute postprocedural respiratory failure: Secondary | ICD-10-CM | POA: Diagnosis not present

## 2019-07-25 DIAGNOSIS — J449 Chronic obstructive pulmonary disease, unspecified: Secondary | ICD-10-CM | POA: Diagnosis not present

## 2019-07-25 DIAGNOSIS — I2510 Atherosclerotic heart disease of native coronary artery without angina pectoris: Secondary | ICD-10-CM | POA: Diagnosis not present

## 2019-07-25 DIAGNOSIS — I5032 Chronic diastolic (congestive) heart failure: Secondary | ICD-10-CM | POA: Diagnosis not present

## 2019-08-03 DIAGNOSIS — J441 Chronic obstructive pulmonary disease with (acute) exacerbation: Secondary | ICD-10-CM | POA: Diagnosis not present

## 2019-08-03 DIAGNOSIS — E782 Mixed hyperlipidemia: Secondary | ICD-10-CM | POA: Diagnosis not present

## 2019-08-03 DIAGNOSIS — D649 Anemia, unspecified: Secondary | ICD-10-CM | POA: Diagnosis not present

## 2019-08-03 DIAGNOSIS — E1169 Type 2 diabetes mellitus with other specified complication: Secondary | ICD-10-CM | POA: Diagnosis not present

## 2019-08-03 DIAGNOSIS — I10 Essential (primary) hypertension: Secondary | ICD-10-CM | POA: Diagnosis not present

## 2019-08-22 DIAGNOSIS — J449 Chronic obstructive pulmonary disease, unspecified: Secondary | ICD-10-CM | POA: Diagnosis not present

## 2019-08-22 DIAGNOSIS — I2510 Atherosclerotic heart disease of native coronary artery without angina pectoris: Secondary | ICD-10-CM | POA: Diagnosis not present

## 2019-08-22 DIAGNOSIS — I5032 Chronic diastolic (congestive) heart failure: Secondary | ICD-10-CM | POA: Diagnosis not present

## 2019-08-22 DIAGNOSIS — J95821 Acute postprocedural respiratory failure: Secondary | ICD-10-CM | POA: Diagnosis not present

## 2019-09-05 DIAGNOSIS — E1169 Type 2 diabetes mellitus with other specified complication: Secondary | ICD-10-CM | POA: Diagnosis not present

## 2019-09-05 DIAGNOSIS — K219 Gastro-esophageal reflux disease without esophagitis: Secondary | ICD-10-CM | POA: Diagnosis not present

## 2019-09-05 DIAGNOSIS — I10 Essential (primary) hypertension: Secondary | ICD-10-CM | POA: Diagnosis not present

## 2019-09-05 DIAGNOSIS — D649 Anemia, unspecified: Secondary | ICD-10-CM | POA: Diagnosis not present

## 2019-09-05 DIAGNOSIS — J441 Chronic obstructive pulmonary disease with (acute) exacerbation: Secondary | ICD-10-CM | POA: Diagnosis not present

## 2019-09-22 DIAGNOSIS — J449 Chronic obstructive pulmonary disease, unspecified: Secondary | ICD-10-CM | POA: Diagnosis not present

## 2019-09-22 DIAGNOSIS — I5032 Chronic diastolic (congestive) heart failure: Secondary | ICD-10-CM | POA: Diagnosis not present

## 2019-09-22 DIAGNOSIS — I2510 Atherosclerotic heart disease of native coronary artery without angina pectoris: Secondary | ICD-10-CM | POA: Diagnosis not present

## 2019-09-22 DIAGNOSIS — J95821 Acute postprocedural respiratory failure: Secondary | ICD-10-CM | POA: Diagnosis not present

## 2019-10-01 DIAGNOSIS — B029 Zoster without complications: Secondary | ICD-10-CM | POA: Diagnosis not present

## 2019-10-01 DIAGNOSIS — E782 Mixed hyperlipidemia: Secondary | ICD-10-CM | POA: Diagnosis not present

## 2019-10-01 DIAGNOSIS — R269 Unspecified abnormalities of gait and mobility: Secondary | ICD-10-CM | POA: Diagnosis not present

## 2019-10-01 DIAGNOSIS — J441 Chronic obstructive pulmonary disease with (acute) exacerbation: Secondary | ICD-10-CM | POA: Diagnosis not present

## 2019-10-01 DIAGNOSIS — E039 Hypothyroidism, unspecified: Secondary | ICD-10-CM | POA: Diagnosis not present

## 2019-10-01 DIAGNOSIS — E1140 Type 2 diabetes mellitus with diabetic neuropathy, unspecified: Secondary | ICD-10-CM | POA: Diagnosis not present

## 2019-10-01 DIAGNOSIS — Z23 Encounter for immunization: Secondary | ICD-10-CM | POA: Diagnosis not present

## 2019-10-01 DIAGNOSIS — E1169 Type 2 diabetes mellitus with other specified complication: Secondary | ICD-10-CM | POA: Diagnosis not present

## 2019-10-01 DIAGNOSIS — I10 Essential (primary) hypertension: Secondary | ICD-10-CM | POA: Diagnosis not present

## 2019-10-01 DIAGNOSIS — B0223 Postherpetic polyneuropathy: Secondary | ICD-10-CM | POA: Diagnosis not present

## 2019-10-01 DIAGNOSIS — D649 Anemia, unspecified: Secondary | ICD-10-CM | POA: Diagnosis not present

## 2019-10-08 DIAGNOSIS — U071 COVID-19: Secondary | ICD-10-CM | POA: Diagnosis not present

## 2019-10-19 DIAGNOSIS — E119 Type 2 diabetes mellitus without complications: Secondary | ICD-10-CM | POA: Diagnosis not present

## 2019-10-19 DIAGNOSIS — K219 Gastro-esophageal reflux disease without esophagitis: Secondary | ICD-10-CM | POA: Diagnosis not present

## 2019-10-19 DIAGNOSIS — I10 Essential (primary) hypertension: Secondary | ICD-10-CM | POA: Diagnosis not present

## 2019-10-19 DIAGNOSIS — E039 Hypothyroidism, unspecified: Secondary | ICD-10-CM | POA: Diagnosis not present

## 2019-10-22 DIAGNOSIS — J95821 Acute postprocedural respiratory failure: Secondary | ICD-10-CM | POA: Diagnosis not present

## 2019-10-22 DIAGNOSIS — I2510 Atherosclerotic heart disease of native coronary artery without angina pectoris: Secondary | ICD-10-CM | POA: Diagnosis not present

## 2019-10-22 DIAGNOSIS — I5032 Chronic diastolic (congestive) heart failure: Secondary | ICD-10-CM | POA: Diagnosis not present

## 2019-10-22 DIAGNOSIS — J449 Chronic obstructive pulmonary disease, unspecified: Secondary | ICD-10-CM | POA: Diagnosis not present

## 2019-10-24 DIAGNOSIS — E039 Hypothyroidism, unspecified: Secondary | ICD-10-CM | POA: Diagnosis not present

## 2019-10-24 DIAGNOSIS — Z87891 Personal history of nicotine dependence: Secondary | ICD-10-CM | POA: Diagnosis not present

## 2019-10-24 DIAGNOSIS — K649 Unspecified hemorrhoids: Secondary | ICD-10-CM | POA: Diagnosis not present

## 2019-10-24 DIAGNOSIS — H548 Legal blindness, as defined in USA: Secondary | ICD-10-CM | POA: Diagnosis not present

## 2019-10-24 DIAGNOSIS — R531 Weakness: Secondary | ICD-10-CM | POA: Diagnosis not present

## 2019-10-24 DIAGNOSIS — E119 Type 2 diabetes mellitus without complications: Secondary | ICD-10-CM | POA: Diagnosis not present

## 2019-10-24 DIAGNOSIS — I10 Essential (primary) hypertension: Secondary | ICD-10-CM | POA: Diagnosis not present

## 2019-10-24 DIAGNOSIS — I2510 Atherosclerotic heart disease of native coronary artery without angina pectoris: Secondary | ICD-10-CM | POA: Diagnosis not present

## 2019-10-24 DIAGNOSIS — K581 Irritable bowel syndrome with constipation: Secondary | ICD-10-CM | POA: Diagnosis not present

## 2019-10-24 DIAGNOSIS — I700 Atherosclerosis of aorta: Secondary | ICD-10-CM | POA: Diagnosis not present

## 2019-10-24 DIAGNOSIS — H44522 Atrophy of globe, left eye: Secondary | ICD-10-CM | POA: Diagnosis not present

## 2019-10-24 DIAGNOSIS — D649 Anemia, unspecified: Secondary | ICD-10-CM | POA: Diagnosis not present

## 2019-10-24 DIAGNOSIS — W3400XS Accidental discharge from unspecified firearms or gun, sequela: Secondary | ICD-10-CM | POA: Diagnosis not present

## 2019-10-24 DIAGNOSIS — Z7984 Long term (current) use of oral hypoglycemic drugs: Secondary | ICD-10-CM | POA: Diagnosis not present

## 2019-10-24 DIAGNOSIS — S0571XS Avulsion of right eye, sequela: Secondary | ICD-10-CM | POA: Diagnosis not present

## 2019-10-24 DIAGNOSIS — J449 Chronic obstructive pulmonary disease, unspecified: Secondary | ICD-10-CM | POA: Diagnosis not present

## 2019-10-25 ENCOUNTER — Encounter: Payer: Self-pay | Admitting: Cardiology

## 2019-10-25 DIAGNOSIS — K581 Irritable bowel syndrome with constipation: Secondary | ICD-10-CM | POA: Diagnosis not present

## 2019-10-25 DIAGNOSIS — I2510 Atherosclerotic heart disease of native coronary artery without angina pectoris: Secondary | ICD-10-CM | POA: Diagnosis not present

## 2019-10-25 DIAGNOSIS — Z87891 Personal history of nicotine dependence: Secondary | ICD-10-CM | POA: Diagnosis not present

## 2019-10-25 DIAGNOSIS — H44522 Atrophy of globe, left eye: Secondary | ICD-10-CM | POA: Diagnosis not present

## 2019-10-25 DIAGNOSIS — D649 Anemia, unspecified: Secondary | ICD-10-CM | POA: Diagnosis not present

## 2019-10-25 DIAGNOSIS — E119 Type 2 diabetes mellitus without complications: Secondary | ICD-10-CM | POA: Diagnosis not present

## 2019-10-25 DIAGNOSIS — J449 Chronic obstructive pulmonary disease, unspecified: Secondary | ICD-10-CM | POA: Diagnosis not present

## 2019-10-25 DIAGNOSIS — Z7984 Long term (current) use of oral hypoglycemic drugs: Secondary | ICD-10-CM | POA: Diagnosis not present

## 2019-10-25 DIAGNOSIS — K649 Unspecified hemorrhoids: Secondary | ICD-10-CM | POA: Diagnosis not present

## 2019-10-25 DIAGNOSIS — I10 Essential (primary) hypertension: Secondary | ICD-10-CM | POA: Diagnosis not present

## 2019-10-25 DIAGNOSIS — S0571XS Avulsion of right eye, sequela: Secondary | ICD-10-CM | POA: Diagnosis not present

## 2019-10-25 DIAGNOSIS — I700 Atherosclerosis of aorta: Secondary | ICD-10-CM | POA: Diagnosis not present

## 2019-10-25 DIAGNOSIS — E039 Hypothyroidism, unspecified: Secondary | ICD-10-CM | POA: Diagnosis not present

## 2019-10-25 DIAGNOSIS — R531 Weakness: Secondary | ICD-10-CM | POA: Diagnosis not present

## 2019-10-25 DIAGNOSIS — H548 Legal blindness, as defined in USA: Secondary | ICD-10-CM | POA: Diagnosis not present

## 2019-10-25 DIAGNOSIS — W3400XS Accidental discharge from unspecified firearms or gun, sequela: Secondary | ICD-10-CM | POA: Diagnosis not present

## 2019-10-30 DIAGNOSIS — E039 Hypothyroidism, unspecified: Secondary | ICD-10-CM | POA: Diagnosis not present

## 2019-10-30 DIAGNOSIS — W3400XS Accidental discharge from unspecified firearms or gun, sequela: Secondary | ICD-10-CM | POA: Diagnosis not present

## 2019-10-30 DIAGNOSIS — D649 Anemia, unspecified: Secondary | ICD-10-CM | POA: Diagnosis not present

## 2019-10-30 DIAGNOSIS — Z7984 Long term (current) use of oral hypoglycemic drugs: Secondary | ICD-10-CM | POA: Diagnosis not present

## 2019-10-30 DIAGNOSIS — H44522 Atrophy of globe, left eye: Secondary | ICD-10-CM | POA: Diagnosis not present

## 2019-10-30 DIAGNOSIS — I2510 Atherosclerotic heart disease of native coronary artery without angina pectoris: Secondary | ICD-10-CM | POA: Diagnosis not present

## 2019-10-30 DIAGNOSIS — K649 Unspecified hemorrhoids: Secondary | ICD-10-CM | POA: Diagnosis not present

## 2019-10-30 DIAGNOSIS — H548 Legal blindness, as defined in USA: Secondary | ICD-10-CM | POA: Diagnosis not present

## 2019-10-30 DIAGNOSIS — S0571XS Avulsion of right eye, sequela: Secondary | ICD-10-CM | POA: Diagnosis not present

## 2019-10-30 DIAGNOSIS — R531 Weakness: Secondary | ICD-10-CM | POA: Diagnosis not present

## 2019-10-30 DIAGNOSIS — K581 Irritable bowel syndrome with constipation: Secondary | ICD-10-CM | POA: Diagnosis not present

## 2019-10-30 DIAGNOSIS — I10 Essential (primary) hypertension: Secondary | ICD-10-CM | POA: Diagnosis not present

## 2019-10-30 DIAGNOSIS — Z87891 Personal history of nicotine dependence: Secondary | ICD-10-CM | POA: Diagnosis not present

## 2019-10-30 DIAGNOSIS — I700 Atherosclerosis of aorta: Secondary | ICD-10-CM | POA: Diagnosis not present

## 2019-10-30 DIAGNOSIS — E119 Type 2 diabetes mellitus without complications: Secondary | ICD-10-CM | POA: Diagnosis not present

## 2019-10-30 DIAGNOSIS — J449 Chronic obstructive pulmonary disease, unspecified: Secondary | ICD-10-CM | POA: Diagnosis not present

## 2019-11-01 DIAGNOSIS — I2510 Atherosclerotic heart disease of native coronary artery without angina pectoris: Secondary | ICD-10-CM | POA: Diagnosis not present

## 2019-11-01 DIAGNOSIS — H548 Legal blindness, as defined in USA: Secondary | ICD-10-CM | POA: Diagnosis not present

## 2019-11-01 DIAGNOSIS — K581 Irritable bowel syndrome with constipation: Secondary | ICD-10-CM | POA: Diagnosis not present

## 2019-11-01 DIAGNOSIS — I700 Atherosclerosis of aorta: Secondary | ICD-10-CM | POA: Diagnosis not present

## 2019-11-01 DIAGNOSIS — S0571XS Avulsion of right eye, sequela: Secondary | ICD-10-CM | POA: Diagnosis not present

## 2019-11-01 DIAGNOSIS — I10 Essential (primary) hypertension: Secondary | ICD-10-CM | POA: Diagnosis not present

## 2019-11-01 DIAGNOSIS — E039 Hypothyroidism, unspecified: Secondary | ICD-10-CM | POA: Diagnosis not present

## 2019-11-01 DIAGNOSIS — K649 Unspecified hemorrhoids: Secondary | ICD-10-CM | POA: Diagnosis not present

## 2019-11-01 DIAGNOSIS — D649 Anemia, unspecified: Secondary | ICD-10-CM | POA: Diagnosis not present

## 2019-11-01 DIAGNOSIS — J449 Chronic obstructive pulmonary disease, unspecified: Secondary | ICD-10-CM | POA: Diagnosis not present

## 2019-11-01 DIAGNOSIS — E119 Type 2 diabetes mellitus without complications: Secondary | ICD-10-CM | POA: Diagnosis not present

## 2019-11-01 DIAGNOSIS — Z7984 Long term (current) use of oral hypoglycemic drugs: Secondary | ICD-10-CM | POA: Diagnosis not present

## 2019-11-01 DIAGNOSIS — Z87891 Personal history of nicotine dependence: Secondary | ICD-10-CM | POA: Diagnosis not present

## 2019-11-01 DIAGNOSIS — W3400XS Accidental discharge from unspecified firearms or gun, sequela: Secondary | ICD-10-CM | POA: Diagnosis not present

## 2019-11-01 DIAGNOSIS — R531 Weakness: Secondary | ICD-10-CM | POA: Diagnosis not present

## 2019-11-01 DIAGNOSIS — H44522 Atrophy of globe, left eye: Secondary | ICD-10-CM | POA: Diagnosis not present

## 2019-11-05 ENCOUNTER — Telehealth: Payer: Self-pay | Admitting: Cardiology

## 2019-11-05 DIAGNOSIS — K649 Unspecified hemorrhoids: Secondary | ICD-10-CM | POA: Diagnosis not present

## 2019-11-05 DIAGNOSIS — K581 Irritable bowel syndrome with constipation: Secondary | ICD-10-CM | POA: Diagnosis not present

## 2019-11-05 DIAGNOSIS — J449 Chronic obstructive pulmonary disease, unspecified: Secondary | ICD-10-CM | POA: Diagnosis not present

## 2019-11-05 DIAGNOSIS — R531 Weakness: Secondary | ICD-10-CM | POA: Diagnosis not present

## 2019-11-05 DIAGNOSIS — Z7984 Long term (current) use of oral hypoglycemic drugs: Secondary | ICD-10-CM | POA: Diagnosis not present

## 2019-11-05 DIAGNOSIS — D649 Anemia, unspecified: Secondary | ICD-10-CM | POA: Diagnosis not present

## 2019-11-05 DIAGNOSIS — S0571XS Avulsion of right eye, sequela: Secondary | ICD-10-CM | POA: Diagnosis not present

## 2019-11-05 DIAGNOSIS — Z87891 Personal history of nicotine dependence: Secondary | ICD-10-CM | POA: Diagnosis not present

## 2019-11-05 DIAGNOSIS — H44522 Atrophy of globe, left eye: Secondary | ICD-10-CM | POA: Diagnosis not present

## 2019-11-05 DIAGNOSIS — E119 Type 2 diabetes mellitus without complications: Secondary | ICD-10-CM | POA: Diagnosis not present

## 2019-11-05 DIAGNOSIS — I10 Essential (primary) hypertension: Secondary | ICD-10-CM | POA: Diagnosis not present

## 2019-11-05 DIAGNOSIS — I2510 Atherosclerotic heart disease of native coronary artery without angina pectoris: Secondary | ICD-10-CM | POA: Diagnosis not present

## 2019-11-05 DIAGNOSIS — W3400XS Accidental discharge from unspecified firearms or gun, sequela: Secondary | ICD-10-CM | POA: Diagnosis not present

## 2019-11-05 DIAGNOSIS — I700 Atherosclerosis of aorta: Secondary | ICD-10-CM | POA: Diagnosis not present

## 2019-11-05 DIAGNOSIS — H548 Legal blindness, as defined in USA: Secondary | ICD-10-CM | POA: Diagnosis not present

## 2019-11-05 DIAGNOSIS — E039 Hypothyroidism, unspecified: Secondary | ICD-10-CM | POA: Diagnosis not present

## 2019-11-05 NOTE — Telephone Encounter (Signed)
  Patient Consent for Virtual Visit         [name] has provided verbal consent on 11/05/2019 for a virtual visit (video or telephone).   CONSENT FOR VIRTUAL VISIT FOR:  [name]  By participating in this virtual visit I agree to the following:  I hereby voluntarily request, consent and authorize [facility] and its employed or contracted physicians, physician assistants, nurse practitioners or other licensed health care professionals (the Practitioner), to provide me with telemedicine health care services (the "Services") as deemed necessary by the treating Practitioner. I acknowledge and consent to receive the Services by the Practitioner via telemedicine. I understand that the telemedicine visit will involve communicating with the Practitioner through live audiovisual communication technology and the disclosure of certain medical information by electronic transmission. I acknowledge that I have been given the opportunity to request an in-person assessment or other available alternative prior to the telemedicine visit and am voluntarily participating in the telemedicine visit.  I understand that I have the right to withhold or withdraw my consent to the use of telemedicine in the course of my care at any time, without affecting my right to future care or treatment, and that the Practitioner or I may terminate the telemedicine visit at any time. I understand that I have the right to inspect all information obtained and/or recorded in the course of the telemedicine visit and may receive copies of available information for a reasonable fee.  I understand that some of the potential risks of receiving the Services via telemedicine include:  Marland Kitchen Delay or interruption in medical evaluation due to technological equipment failure or disruption; . Information transmitted may not be sufficient (e.g. poor resolution of images) to allow for appropriate medical decision making by the Practitioner;  and/or  . In rare instances, security protocols could fail, causing a breach of personal health information.  Furthermore, I acknowledge that it is my responsibility to provide information about my medical history, conditions and care that is complete and accurate to the best of my ability. I acknowledge that Practitioner's advice, recommendations, and/or decision may be based on factors not within their control, such as incomplete or inaccurate data provided by me or distortions of diagnostic images or specimens that may result from electronic transmissions. I understand that the practice of medicine is not an exact science and that Practitioner makes no warranties or guarantees regarding treatment outcomes. I acknowledge that a copy of this consent can be made available to me via my patient portal ([facility]), or I can request a printed copy by calling the office of [facility].    I understand that my insurance will be billed for this visit.   I have read or had this consent read to me. . I understand the contents of this consent, which adequately explains the benefits and risks of the Services being provided via telemedicine.  . I have been provided ample opportunity to ask questions regarding this consent and the Services and have had my questions answered to my satisfaction. . I give my informed consent for the services to be provided through the use of telemedicine in my medical care

## 2019-11-06 ENCOUNTER — Telehealth (INDEPENDENT_AMBULATORY_CARE_PROVIDER_SITE_OTHER): Payer: Medicare Other | Admitting: Cardiology

## 2019-11-06 ENCOUNTER — Encounter: Payer: Self-pay | Admitting: Cardiology

## 2019-11-06 ENCOUNTER — Encounter: Payer: Self-pay | Admitting: *Deleted

## 2019-11-06 VITALS — BP 141/68 | HR 56 | Temp 98.1°F | Ht 60.0 in | Wt 204.0 lb

## 2019-11-06 DIAGNOSIS — I10 Essential (primary) hypertension: Secondary | ICD-10-CM

## 2019-11-06 DIAGNOSIS — E782 Mixed hyperlipidemia: Secondary | ICD-10-CM

## 2019-11-06 DIAGNOSIS — I2510 Atherosclerotic heart disease of native coronary artery without angina pectoris: Secondary | ICD-10-CM
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
## 2019-11-06 NOTE — Patient Instructions (Signed)
Medication Instructions:  Continue all current medications.   Labwork: none  Testing/Procedures: none  Follow-Up: 6 months   Any Other Special Instructions Will Be Listed Below (If Applicable).   If you need a refill on your cardiac medications before your next appointment, please call your pharmacy.  

## 2019-11-07 DIAGNOSIS — H548 Legal blindness, as defined in USA: Secondary | ICD-10-CM | POA: Diagnosis not present

## 2019-11-07 DIAGNOSIS — E7849 Other hyperlipidemia: Secondary | ICD-10-CM | POA: Diagnosis not present

## 2019-11-07 DIAGNOSIS — E038 Other specified hypothyroidism: Secondary | ICD-10-CM | POA: Diagnosis not present

## 2019-11-07 DIAGNOSIS — K581 Irritable bowel syndrome with constipation: Secondary | ICD-10-CM | POA: Diagnosis not present

## 2019-11-07 DIAGNOSIS — K649 Unspecified hemorrhoids: Secondary | ICD-10-CM | POA: Diagnosis not present

## 2019-11-07 DIAGNOSIS — W3400XS Accidental discharge from unspecified firearms or gun, sequela: Secondary | ICD-10-CM | POA: Diagnosis not present

## 2019-11-07 DIAGNOSIS — I700 Atherosclerosis of aorta: Secondary | ICD-10-CM | POA: Diagnosis not present

## 2019-11-07 DIAGNOSIS — Z87891 Personal history of nicotine dependence: Secondary | ICD-10-CM | POA: Diagnosis not present

## 2019-11-07 DIAGNOSIS — E039 Hypothyroidism, unspecified: Secondary | ICD-10-CM | POA: Diagnosis not present

## 2019-11-07 DIAGNOSIS — J449 Chronic obstructive pulmonary disease, unspecified: Secondary | ICD-10-CM | POA: Diagnosis not present

## 2019-11-07 DIAGNOSIS — E119 Type 2 diabetes mellitus without complications: Secondary | ICD-10-CM | POA: Diagnosis not present

## 2019-11-07 DIAGNOSIS — Z7984 Long term (current) use of oral hypoglycemic drugs: Secondary | ICD-10-CM | POA: Diagnosis not present

## 2019-11-07 DIAGNOSIS — Z79899 Other long term (current) drug therapy: Secondary | ICD-10-CM | POA: Diagnosis not present

## 2019-11-07 DIAGNOSIS — I10 Essential (primary) hypertension: Secondary | ICD-10-CM | POA: Diagnosis not present

## 2019-11-07 DIAGNOSIS — D649 Anemia, unspecified: Secondary | ICD-10-CM | POA: Diagnosis not present

## 2019-11-07 DIAGNOSIS — I2510 Atherosclerotic heart disease of native coronary artery without angina pectoris: Secondary | ICD-10-CM | POA: Diagnosis not present

## 2019-11-07 DIAGNOSIS — E559 Vitamin D deficiency, unspecified: Secondary | ICD-10-CM | POA: Diagnosis not present

## 2019-11-07 DIAGNOSIS — H44522 Atrophy of globe, left eye: Secondary | ICD-10-CM | POA: Diagnosis not present

## 2019-11-07 DIAGNOSIS — S0571XS Avulsion of right eye, sequela: Secondary | ICD-10-CM | POA: Diagnosis not present

## 2019-11-07 DIAGNOSIS — R531 Weakness: Secondary | ICD-10-CM | POA: Diagnosis not present

## 2019-11-07 DIAGNOSIS — D518 Other vitamin B12 deficiency anemias: Secondary | ICD-10-CM | POA: Diagnosis not present

## 2019-11-12 DIAGNOSIS — I10 Essential (primary) hypertension: Secondary | ICD-10-CM | POA: Diagnosis not present

## 2019-11-12 DIAGNOSIS — K581 Irritable bowel syndrome with constipation: Secondary | ICD-10-CM | POA: Diagnosis not present

## 2019-11-12 DIAGNOSIS — S0571XS Avulsion of right eye, sequela: Secondary | ICD-10-CM | POA: Diagnosis not present

## 2019-11-12 DIAGNOSIS — R531 Weakness: Secondary | ICD-10-CM | POA: Diagnosis not present

## 2019-11-12 DIAGNOSIS — Z7984 Long term (current) use of oral hypoglycemic drugs: Secondary | ICD-10-CM | POA: Diagnosis not present

## 2019-11-12 DIAGNOSIS — I700 Atherosclerosis of aorta: Secondary | ICD-10-CM | POA: Diagnosis not present

## 2019-11-12 DIAGNOSIS — H44522 Atrophy of globe, left eye: Secondary | ICD-10-CM | POA: Diagnosis not present

## 2019-11-12 DIAGNOSIS — K649 Unspecified hemorrhoids: Secondary | ICD-10-CM | POA: Diagnosis not present

## 2019-11-12 DIAGNOSIS — I2510 Atherosclerotic heart disease of native coronary artery without angina pectoris: Secondary | ICD-10-CM | POA: Diagnosis not present

## 2019-11-12 DIAGNOSIS — Z87891 Personal history of nicotine dependence: Secondary | ICD-10-CM | POA: Diagnosis not present

## 2019-11-12 DIAGNOSIS — H548 Legal blindness, as defined in USA: Secondary | ICD-10-CM | POA: Diagnosis not present

## 2019-11-12 DIAGNOSIS — E039 Hypothyroidism, unspecified: Secondary | ICD-10-CM | POA: Diagnosis not present

## 2019-11-12 DIAGNOSIS — E119 Type 2 diabetes mellitus without complications: Secondary | ICD-10-CM | POA: Diagnosis not present

## 2019-11-12 DIAGNOSIS — D649 Anemia, unspecified: Secondary | ICD-10-CM | POA: Diagnosis not present

## 2019-11-12 DIAGNOSIS — J449 Chronic obstructive pulmonary disease, unspecified: Secondary | ICD-10-CM | POA: Diagnosis not present

## 2019-11-12 DIAGNOSIS — W3400XS Accidental discharge from unspecified firearms or gun, sequela: Secondary | ICD-10-CM | POA: Diagnosis not present

## 2019-11-20 DIAGNOSIS — D649 Anemia, unspecified: Secondary | ICD-10-CM | POA: Diagnosis not present

## 2019-11-20 DIAGNOSIS — Z87891 Personal history of nicotine dependence: Secondary | ICD-10-CM | POA: Diagnosis not present

## 2019-11-20 DIAGNOSIS — E039 Hypothyroidism, unspecified: Secondary | ICD-10-CM | POA: Diagnosis not present

## 2019-11-20 DIAGNOSIS — J449 Chronic obstructive pulmonary disease, unspecified: Secondary | ICD-10-CM | POA: Diagnosis not present

## 2019-11-20 DIAGNOSIS — R531 Weakness: Secondary | ICD-10-CM | POA: Diagnosis not present

## 2019-11-20 DIAGNOSIS — I2510 Atherosclerotic heart disease of native coronary artery without angina pectoris: Secondary | ICD-10-CM | POA: Diagnosis not present

## 2019-11-20 DIAGNOSIS — Z7984 Long term (current) use of oral hypoglycemic drugs: Secondary | ICD-10-CM | POA: Diagnosis not present

## 2019-11-20 DIAGNOSIS — K581 Irritable bowel syndrome with constipation: Secondary | ICD-10-CM | POA: Diagnosis not present

## 2019-11-20 DIAGNOSIS — S0571XS Avulsion of right eye, sequela: Secondary | ICD-10-CM | POA: Diagnosis not present

## 2019-11-20 DIAGNOSIS — E119 Type 2 diabetes mellitus without complications: Secondary | ICD-10-CM | POA: Diagnosis not present

## 2019-11-20 DIAGNOSIS — H548 Legal blindness, as defined in USA: Secondary | ICD-10-CM | POA: Diagnosis not present

## 2019-11-20 DIAGNOSIS — I10 Essential (primary) hypertension: Secondary | ICD-10-CM | POA: Diagnosis not present

## 2019-11-20 DIAGNOSIS — H44522 Atrophy of globe, left eye: Secondary | ICD-10-CM | POA: Diagnosis not present

## 2019-11-20 DIAGNOSIS — I700 Atherosclerosis of aorta: Secondary | ICD-10-CM | POA: Diagnosis not present

## 2019-11-20 DIAGNOSIS — K649 Unspecified hemorrhoids: Secondary | ICD-10-CM | POA: Diagnosis not present

## 2019-11-20 DIAGNOSIS — W3400XS Accidental discharge from unspecified firearms or gun, sequela: Secondary | ICD-10-CM | POA: Diagnosis not present

## 2019-11-22 DIAGNOSIS — J95821 Acute postprocedural respiratory failure: Secondary | ICD-10-CM | POA: Diagnosis not present

## 2019-11-22 DIAGNOSIS — I5032 Chronic diastolic (congestive) heart failure: Secondary | ICD-10-CM | POA: Diagnosis not present

## 2019-11-22 DIAGNOSIS — I2510 Atherosclerotic heart disease of native coronary artery without angina pectoris: Secondary | ICD-10-CM | POA: Diagnosis not present

## 2019-11-22 DIAGNOSIS — J449 Chronic obstructive pulmonary disease, unspecified: Secondary | ICD-10-CM | POA: Diagnosis not present

## 2019-11-23 DIAGNOSIS — I10 Essential (primary) hypertension: Secondary | ICD-10-CM | POA: Diagnosis not present

## 2019-11-23 DIAGNOSIS — E039 Hypothyroidism, unspecified: Secondary | ICD-10-CM | POA: Diagnosis not present

## 2019-11-23 DIAGNOSIS — K219 Gastro-esophageal reflux disease without esophagitis: Secondary | ICD-10-CM | POA: Diagnosis not present

## 2019-11-27 DIAGNOSIS — E1169 Type 2 diabetes mellitus with other specified complication: Secondary | ICD-10-CM | POA: Diagnosis not present

## 2019-11-27 DIAGNOSIS — K219 Gastro-esophageal reflux disease without esophagitis: Secondary | ICD-10-CM | POA: Diagnosis not present

## 2019-11-27 DIAGNOSIS — I10 Essential (primary) hypertension: Secondary | ICD-10-CM | POA: Diagnosis not present

## 2019-11-27 DIAGNOSIS — D649 Anemia, unspecified: Secondary | ICD-10-CM | POA: Diagnosis not present

## 2019-11-27 DIAGNOSIS — U071 COVID-19: Secondary | ICD-10-CM | POA: Diagnosis not present

## 2019-11-27 DIAGNOSIS — E785 Hyperlipidemia, unspecified: Secondary | ICD-10-CM | POA: Diagnosis not present

## 2019-12-18 DIAGNOSIS — R011 Cardiac murmur, unspecified: Secondary | ICD-10-CM | POA: Diagnosis not present

## 2019-12-19 DIAGNOSIS — I6523 Occlusion and stenosis of bilateral carotid arteries: Secondary | ICD-10-CM | POA: Diagnosis not present

## 2019-12-19 DIAGNOSIS — R590 Localized enlarged lymph nodes: Secondary | ICD-10-CM | POA: Diagnosis not present

## 2019-12-19 DIAGNOSIS — R0989 Other specified symptoms and signs involving the circulatory and respiratory systems: Secondary | ICD-10-CM | POA: Diagnosis not present

## 2019-12-21 DIAGNOSIS — K219 Gastro-esophageal reflux disease without esophagitis: Secondary | ICD-10-CM | POA: Diagnosis not present

## 2019-12-21 DIAGNOSIS — I5030 Unspecified diastolic (congestive) heart failure: Secondary | ICD-10-CM | POA: Diagnosis not present

## 2019-12-21 DIAGNOSIS — I2510 Atherosclerotic heart disease of native coronary artery without angina pectoris: Secondary | ICD-10-CM | POA: Diagnosis not present

## 2019-12-21 DIAGNOSIS — I10 Essential (primary) hypertension: Secondary | ICD-10-CM | POA: Diagnosis not present

## 2019-12-22 DIAGNOSIS — J449 Chronic obstructive pulmonary disease, unspecified: Secondary | ICD-10-CM | POA: Diagnosis not present

## 2019-12-22 DIAGNOSIS — I5032 Chronic diastolic (congestive) heart failure: Secondary | ICD-10-CM | POA: Diagnosis not present

## 2019-12-22 DIAGNOSIS — J95821 Acute postprocedural respiratory failure: Secondary | ICD-10-CM | POA: Diagnosis not present

## 2019-12-22 DIAGNOSIS — I2510 Atherosclerotic heart disease of native coronary artery without angina pectoris: Secondary | ICD-10-CM | POA: Diagnosis not present

## 2019-12-24 DIAGNOSIS — E042 Nontoxic multinodular goiter: Secondary | ICD-10-CM | POA: Diagnosis not present

## 2019-12-24 DIAGNOSIS — I714 Abdominal aortic aneurysm, without rupture: Secondary | ICD-10-CM | POA: Diagnosis not present

## 2019-12-24 DIAGNOSIS — R229 Localized swelling, mass and lump, unspecified: Secondary | ICD-10-CM | POA: Diagnosis not present

## 2019-12-24 DIAGNOSIS — D492 Neoplasm of unspecified behavior of bone, soft tissue, and skin: Secondary | ICD-10-CM | POA: Diagnosis not present

## 2019-12-24 DIAGNOSIS — I700 Atherosclerosis of aorta: Secondary | ICD-10-CM | POA: Diagnosis not present

## 2019-12-24 DIAGNOSIS — R221 Localized swelling, mass and lump, neck: Secondary | ICD-10-CM | POA: Diagnosis not present

## 2019-12-28 DIAGNOSIS — I10 Essential (primary) hypertension: Secondary | ICD-10-CM | POA: Diagnosis not present

## 2019-12-28 DIAGNOSIS — K219 Gastro-esophageal reflux disease without esophagitis: Secondary | ICD-10-CM | POA: Diagnosis not present

## 2019-12-28 DIAGNOSIS — D649 Anemia, unspecified: Secondary | ICD-10-CM | POA: Diagnosis not present

## 2019-12-28 DIAGNOSIS — E785 Hyperlipidemia, unspecified: Secondary | ICD-10-CM | POA: Diagnosis not present

## 2019-12-28 DIAGNOSIS — E1169 Type 2 diabetes mellitus with other specified complication: Secondary | ICD-10-CM | POA: Diagnosis not present

## 2020-01-18 DIAGNOSIS — K219 Gastro-esophageal reflux disease without esophagitis: Secondary | ICD-10-CM | POA: Diagnosis not present

## 2020-01-18 DIAGNOSIS — I5030 Unspecified diastolic (congestive) heart failure: Secondary | ICD-10-CM | POA: Diagnosis not present

## 2020-01-18 DIAGNOSIS — I10 Essential (primary) hypertension: Secondary | ICD-10-CM | POA: Diagnosis not present

## 2020-01-18 DIAGNOSIS — I2510 Atherosclerotic heart disease of native coronary artery without angina pectoris: Secondary | ICD-10-CM | POA: Diagnosis not present

## 2020-01-22 DIAGNOSIS — I5032 Chronic diastolic (congestive) heart failure: Secondary | ICD-10-CM | POA: Diagnosis not present

## 2020-01-22 DIAGNOSIS — J95821 Acute postprocedural respiratory failure: Secondary | ICD-10-CM | POA: Diagnosis not present

## 2020-01-22 DIAGNOSIS — J449 Chronic obstructive pulmonary disease, unspecified: Secondary | ICD-10-CM | POA: Diagnosis not present

## 2020-01-22 DIAGNOSIS — I2510 Atherosclerotic heart disease of native coronary artery without angina pectoris: Secondary | ICD-10-CM | POA: Diagnosis not present

## 2020-02-06 DIAGNOSIS — E7849 Other hyperlipidemia: Secondary | ICD-10-CM | POA: Diagnosis not present

## 2020-02-06 DIAGNOSIS — E038 Other specified hypothyroidism: Secondary | ICD-10-CM | POA: Diagnosis not present

## 2020-02-06 DIAGNOSIS — E119 Type 2 diabetes mellitus without complications: Secondary | ICD-10-CM | POA: Diagnosis not present

## 2020-02-06 DIAGNOSIS — D518 Other vitamin B12 deficiency anemias: Secondary | ICD-10-CM | POA: Diagnosis not present

## 2020-02-06 DIAGNOSIS — Z79899 Other long term (current) drug therapy: Secondary | ICD-10-CM | POA: Diagnosis not present

## 2020-02-22 DIAGNOSIS — J449 Chronic obstructive pulmonary disease, unspecified: Secondary | ICD-10-CM | POA: Diagnosis not present

## 2020-02-22 DIAGNOSIS — I2510 Atherosclerotic heart disease of native coronary artery without angina pectoris: Secondary | ICD-10-CM | POA: Diagnosis not present

## 2020-02-22 DIAGNOSIS — J95821 Acute postprocedural respiratory failure: Secondary | ICD-10-CM | POA: Diagnosis not present

## 2020-02-22 DIAGNOSIS — I5032 Chronic diastolic (congestive) heart failure: Secondary | ICD-10-CM | POA: Diagnosis not present

## 2020-02-27 DIAGNOSIS — K219 Gastro-esophageal reflux disease without esophagitis: Secondary | ICD-10-CM | POA: Diagnosis not present

## 2020-02-27 DIAGNOSIS — I10 Essential (primary) hypertension: Secondary | ICD-10-CM | POA: Diagnosis not present

## 2020-02-27 DIAGNOSIS — R7989 Other specified abnormal findings of blood chemistry: Secondary | ICD-10-CM | POA: Diagnosis not present

## 2020-02-27 DIAGNOSIS — G629 Polyneuropathy, unspecified: Secondary | ICD-10-CM | POA: Diagnosis not present

## 2020-02-27 DIAGNOSIS — I5030 Unspecified diastolic (congestive) heart failure: Secondary | ICD-10-CM | POA: Diagnosis not present

## 2020-02-28 DIAGNOSIS — E119 Type 2 diabetes mellitus without complications: Secondary | ICD-10-CM | POA: Diagnosis not present

## 2020-02-28 DIAGNOSIS — I10 Essential (primary) hypertension: Secondary | ICD-10-CM | POA: Diagnosis not present

## 2020-02-28 DIAGNOSIS — E039 Hypothyroidism, unspecified: Secondary | ICD-10-CM | POA: Diagnosis not present

## 2020-02-28 DIAGNOSIS — E038 Other specified hypothyroidism: Secondary | ICD-10-CM | POA: Diagnosis not present

## 2020-02-28 DIAGNOSIS — D518 Other vitamin B12 deficiency anemias: Secondary | ICD-10-CM | POA: Diagnosis not present

## 2020-03-04 DIAGNOSIS — Z79899 Other long term (current) drug therapy: Secondary | ICD-10-CM | POA: Diagnosis not present

## 2020-03-04 DIAGNOSIS — R7989 Other specified abnormal findings of blood chemistry: Secondary | ICD-10-CM | POA: Diagnosis not present

## 2020-03-17 DIAGNOSIS — E038 Other specified hypothyroidism: Secondary | ICD-10-CM | POA: Diagnosis not present

## 2020-03-17 DIAGNOSIS — E039 Hypothyroidism, unspecified: Secondary | ICD-10-CM | POA: Diagnosis not present

## 2020-03-17 DIAGNOSIS — D518 Other vitamin B12 deficiency anemias: Secondary | ICD-10-CM | POA: Diagnosis not present

## 2020-03-17 DIAGNOSIS — I10 Essential (primary) hypertension: Secondary | ICD-10-CM | POA: Diagnosis not present

## 2020-03-17 DIAGNOSIS — E119 Type 2 diabetes mellitus without complications: Secondary | ICD-10-CM | POA: Diagnosis not present

## 2020-03-21 DIAGNOSIS — M2042 Other hammer toe(s) (acquired), left foot: Secondary | ICD-10-CM | POA: Diagnosis not present

## 2020-03-21 DIAGNOSIS — E119 Type 2 diabetes mellitus without complications: Secondary | ICD-10-CM | POA: Diagnosis not present

## 2020-03-21 DIAGNOSIS — B351 Tinea unguium: Secondary | ICD-10-CM | POA: Diagnosis not present

## 2020-03-23 DIAGNOSIS — I2510 Atherosclerotic heart disease of native coronary artery without angina pectoris: Secondary | ICD-10-CM | POA: Diagnosis not present

## 2020-03-23 DIAGNOSIS — J95821 Acute postprocedural respiratory failure: Secondary | ICD-10-CM | POA: Diagnosis not present

## 2020-03-23 DIAGNOSIS — I5032 Chronic diastolic (congestive) heart failure: Secondary | ICD-10-CM | POA: Diagnosis not present

## 2020-03-23 DIAGNOSIS — J449 Chronic obstructive pulmonary disease, unspecified: Secondary | ICD-10-CM | POA: Diagnosis not present

## 2020-03-25 DIAGNOSIS — K219 Gastro-esophageal reflux disease without esophagitis: Secondary | ICD-10-CM | POA: Diagnosis not present

## 2020-03-25 DIAGNOSIS — E119 Type 2 diabetes mellitus without complications: Secondary | ICD-10-CM | POA: Diagnosis not present

## 2020-03-25 DIAGNOSIS — I10 Essential (primary) hypertension: Secondary | ICD-10-CM | POA: Diagnosis not present

## 2020-03-25 DIAGNOSIS — E785 Hyperlipidemia, unspecified: Secondary | ICD-10-CM | POA: Diagnosis not present

## 2020-04-15 DIAGNOSIS — E038 Other specified hypothyroidism: Secondary | ICD-10-CM | POA: Diagnosis not present

## 2020-04-15 DIAGNOSIS — E119 Type 2 diabetes mellitus without complications: Secondary | ICD-10-CM | POA: Diagnosis not present

## 2020-04-15 DIAGNOSIS — I10 Essential (primary) hypertension: Secondary | ICD-10-CM | POA: Diagnosis not present

## 2020-04-15 DIAGNOSIS — D518 Other vitamin B12 deficiency anemias: Secondary | ICD-10-CM | POA: Diagnosis not present

## 2020-04-15 DIAGNOSIS — E039 Hypothyroidism, unspecified: Secondary | ICD-10-CM | POA: Diagnosis not present

## 2020-04-22 DIAGNOSIS — I2510 Atherosclerotic heart disease of native coronary artery without angina pectoris: Secondary | ICD-10-CM | POA: Diagnosis not present

## 2020-04-22 DIAGNOSIS — E119 Type 2 diabetes mellitus without complications: Secondary | ICD-10-CM | POA: Diagnosis not present

## 2020-04-22 DIAGNOSIS — H547 Unspecified visual loss: Secondary | ICD-10-CM | POA: Diagnosis not present

## 2020-04-22 DIAGNOSIS — I5030 Unspecified diastolic (congestive) heart failure: Secondary | ICD-10-CM | POA: Diagnosis not present

## 2020-04-23 DIAGNOSIS — J449 Chronic obstructive pulmonary disease, unspecified: Secondary | ICD-10-CM | POA: Diagnosis not present

## 2020-04-23 DIAGNOSIS — J95821 Acute postprocedural respiratory failure: Secondary | ICD-10-CM | POA: Diagnosis not present

## 2020-04-23 DIAGNOSIS — I5032 Chronic diastolic (congestive) heart failure: Secondary | ICD-10-CM | POA: Diagnosis not present

## 2020-04-23 DIAGNOSIS — I2510 Atherosclerotic heart disease of native coronary artery without angina pectoris: Secondary | ICD-10-CM | POA: Diagnosis not present

## 2020-05-13 DIAGNOSIS — J449 Chronic obstructive pulmonary disease, unspecified: Secondary | ICD-10-CM | POA: Diagnosis not present

## 2020-05-14 DIAGNOSIS — E038 Other specified hypothyroidism: Secondary | ICD-10-CM | POA: Diagnosis not present

## 2020-05-14 DIAGNOSIS — E119 Type 2 diabetes mellitus without complications: Secondary | ICD-10-CM | POA: Diagnosis not present

## 2020-05-14 DIAGNOSIS — R059 Cough, unspecified: Secondary | ICD-10-CM | POA: Diagnosis not present

## 2020-05-14 DIAGNOSIS — E559 Vitamin D deficiency, unspecified: Secondary | ICD-10-CM | POA: Diagnosis not present

## 2020-05-14 DIAGNOSIS — D518 Other vitamin B12 deficiency anemias: Secondary | ICD-10-CM | POA: Diagnosis not present

## 2020-05-14 DIAGNOSIS — Z79899 Other long term (current) drug therapy: Secondary | ICD-10-CM | POA: Diagnosis not present

## 2020-05-15 DIAGNOSIS — E119 Type 2 diabetes mellitus without complications: Secondary | ICD-10-CM | POA: Diagnosis not present

## 2020-05-15 DIAGNOSIS — D518 Other vitamin B12 deficiency anemias: Secondary | ICD-10-CM | POA: Diagnosis not present

## 2020-05-15 DIAGNOSIS — I10 Essential (primary) hypertension: Secondary | ICD-10-CM | POA: Diagnosis not present

## 2020-05-15 DIAGNOSIS — U071 COVID-19: Secondary | ICD-10-CM | POA: Diagnosis not present

## 2020-05-15 DIAGNOSIS — E038 Other specified hypothyroidism: Secondary | ICD-10-CM | POA: Diagnosis not present

## 2020-05-15 DIAGNOSIS — E039 Hypothyroidism, unspecified: Secondary | ICD-10-CM | POA: Diagnosis not present

## 2020-05-20 ENCOUNTER — Ambulatory Visit: Payer: Medicare Other | Admitting: Cardiology

## 2020-05-23 DIAGNOSIS — I2510 Atherosclerotic heart disease of native coronary artery without angina pectoris: Secondary | ICD-10-CM | POA: Diagnosis not present

## 2020-05-23 DIAGNOSIS — J449 Chronic obstructive pulmonary disease, unspecified: Secondary | ICD-10-CM | POA: Diagnosis not present

## 2020-05-23 DIAGNOSIS — I5032 Chronic diastolic (congestive) heart failure: Secondary | ICD-10-CM | POA: Diagnosis not present

## 2020-05-23 DIAGNOSIS — J95821 Acute postprocedural respiratory failure: Secondary | ICD-10-CM | POA: Diagnosis not present

## 2020-06-02 DIAGNOSIS — H548 Legal blindness, as defined in USA: Secondary | ICD-10-CM | POA: Diagnosis not present

## 2020-06-02 DIAGNOSIS — M6281 Muscle weakness (generalized): Secondary | ICD-10-CM | POA: Diagnosis not present

## 2020-06-02 DIAGNOSIS — R278 Other lack of coordination: Secondary | ICD-10-CM | POA: Diagnosis not present

## 2020-06-02 DIAGNOSIS — R262 Difficulty in walking, not elsewhere classified: Secondary | ICD-10-CM | POA: Diagnosis not present

## 2020-06-02 DIAGNOSIS — I10 Essential (primary) hypertension: Secondary | ICD-10-CM | POA: Diagnosis not present

## 2020-06-03 DIAGNOSIS — H547 Unspecified visual loss: Secondary | ICD-10-CM | POA: Diagnosis not present

## 2020-06-03 DIAGNOSIS — I5030 Unspecified diastolic (congestive) heart failure: Secondary | ICD-10-CM | POA: Diagnosis not present

## 2020-06-03 DIAGNOSIS — E119 Type 2 diabetes mellitus without complications: Secondary | ICD-10-CM | POA: Diagnosis not present

## 2020-06-03 DIAGNOSIS — E039 Hypothyroidism, unspecified: Secondary | ICD-10-CM | POA: Diagnosis not present

## 2020-06-03 DIAGNOSIS — E785 Hyperlipidemia, unspecified: Secondary | ICD-10-CM | POA: Diagnosis not present

## 2020-06-03 DIAGNOSIS — I10 Essential (primary) hypertension: Secondary | ICD-10-CM | POA: Diagnosis not present

## 2020-06-03 DIAGNOSIS — J449 Chronic obstructive pulmonary disease, unspecified: Secondary | ICD-10-CM | POA: Diagnosis not present

## 2020-06-07 DIAGNOSIS — M6281 Muscle weakness (generalized): Secondary | ICD-10-CM | POA: Diagnosis not present

## 2020-06-07 DIAGNOSIS — R278 Other lack of coordination: Secondary | ICD-10-CM | POA: Diagnosis not present

## 2020-06-07 DIAGNOSIS — H548 Legal blindness, as defined in USA: Secondary | ICD-10-CM | POA: Diagnosis not present

## 2020-06-07 DIAGNOSIS — I10 Essential (primary) hypertension: Secondary | ICD-10-CM | POA: Diagnosis not present

## 2020-06-07 DIAGNOSIS — R262 Difficulty in walking, not elsewhere classified: Secondary | ICD-10-CM | POA: Diagnosis not present

## 2020-06-09 DIAGNOSIS — M6281 Muscle weakness (generalized): Secondary | ICD-10-CM | POA: Diagnosis not present

## 2020-06-09 DIAGNOSIS — R262 Difficulty in walking, not elsewhere classified: Secondary | ICD-10-CM | POA: Diagnosis not present

## 2020-06-09 DIAGNOSIS — H548 Legal blindness, as defined in USA: Secondary | ICD-10-CM | POA: Diagnosis not present

## 2020-06-09 DIAGNOSIS — R278 Other lack of coordination: Secondary | ICD-10-CM | POA: Diagnosis not present

## 2020-06-09 DIAGNOSIS — I10 Essential (primary) hypertension: Secondary | ICD-10-CM | POA: Diagnosis not present

## 2020-06-11 DIAGNOSIS — M6281 Muscle weakness (generalized): Secondary | ICD-10-CM | POA: Diagnosis not present

## 2020-06-11 DIAGNOSIS — H548 Legal blindness, as defined in USA: Secondary | ICD-10-CM | POA: Diagnosis not present

## 2020-06-11 DIAGNOSIS — I10 Essential (primary) hypertension: Secondary | ICD-10-CM | POA: Diagnosis not present

## 2020-06-11 DIAGNOSIS — R262 Difficulty in walking, not elsewhere classified: Secondary | ICD-10-CM | POA: Diagnosis not present

## 2020-06-11 DIAGNOSIS — R278 Other lack of coordination: Secondary | ICD-10-CM | POA: Diagnosis not present

## 2020-06-12 DIAGNOSIS — E119 Type 2 diabetes mellitus without complications: Secondary | ICD-10-CM | POA: Diagnosis not present

## 2020-06-12 DIAGNOSIS — E039 Hypothyroidism, unspecified: Secondary | ICD-10-CM | POA: Diagnosis not present

## 2020-06-12 DIAGNOSIS — I2510 Atherosclerotic heart disease of native coronary artery without angina pectoris: Secondary | ICD-10-CM | POA: Diagnosis not present

## 2020-06-12 DIAGNOSIS — I10 Essential (primary) hypertension: Secondary | ICD-10-CM | POA: Diagnosis not present

## 2020-06-12 DIAGNOSIS — D518 Other vitamin B12 deficiency anemias: Secondary | ICD-10-CM | POA: Diagnosis not present

## 2020-06-12 DIAGNOSIS — E038 Other specified hypothyroidism: Secondary | ICD-10-CM | POA: Diagnosis not present

## 2020-06-12 DIAGNOSIS — I5030 Unspecified diastolic (congestive) heart failure: Secondary | ICD-10-CM | POA: Diagnosis not present

## 2020-06-12 DIAGNOSIS — J449 Chronic obstructive pulmonary disease, unspecified: Secondary | ICD-10-CM | POA: Diagnosis not present

## 2020-06-12 DIAGNOSIS — E559 Vitamin D deficiency, unspecified: Secondary | ICD-10-CM | POA: Diagnosis not present

## 2020-06-12 DIAGNOSIS — E785 Hyperlipidemia, unspecified: Secondary | ICD-10-CM | POA: Diagnosis not present

## 2020-06-14 DIAGNOSIS — H548 Legal blindness, as defined in USA: Secondary | ICD-10-CM | POA: Diagnosis not present

## 2020-06-14 DIAGNOSIS — M6281 Muscle weakness (generalized): Secondary | ICD-10-CM | POA: Diagnosis not present

## 2020-06-14 DIAGNOSIS — R262 Difficulty in walking, not elsewhere classified: Secondary | ICD-10-CM | POA: Diagnosis not present

## 2020-06-14 DIAGNOSIS — R278 Other lack of coordination: Secondary | ICD-10-CM | POA: Diagnosis not present

## 2020-06-14 DIAGNOSIS — I10 Essential (primary) hypertension: Secondary | ICD-10-CM | POA: Diagnosis not present

## 2020-06-18 DIAGNOSIS — I10 Essential (primary) hypertension: Secondary | ICD-10-CM | POA: Diagnosis not present

## 2020-06-18 DIAGNOSIS — R278 Other lack of coordination: Secondary | ICD-10-CM | POA: Diagnosis not present

## 2020-06-18 DIAGNOSIS — M6281 Muscle weakness (generalized): Secondary | ICD-10-CM | POA: Diagnosis not present

## 2020-06-18 DIAGNOSIS — R262 Difficulty in walking, not elsewhere classified: Secondary | ICD-10-CM | POA: Diagnosis not present

## 2020-06-18 DIAGNOSIS — H548 Legal blindness, as defined in USA: Secondary | ICD-10-CM | POA: Diagnosis not present

## 2020-06-20 DIAGNOSIS — R262 Difficulty in walking, not elsewhere classified: Secondary | ICD-10-CM | POA: Diagnosis not present

## 2020-06-20 DIAGNOSIS — M6281 Muscle weakness (generalized): Secondary | ICD-10-CM | POA: Diagnosis not present

## 2020-06-20 DIAGNOSIS — H548 Legal blindness, as defined in USA: Secondary | ICD-10-CM | POA: Diagnosis not present

## 2020-06-20 DIAGNOSIS — R278 Other lack of coordination: Secondary | ICD-10-CM | POA: Diagnosis not present

## 2020-06-20 DIAGNOSIS — I10 Essential (primary) hypertension: Secondary | ICD-10-CM | POA: Diagnosis not present

## 2020-06-23 DIAGNOSIS — J95821 Acute postprocedural respiratory failure: Secondary | ICD-10-CM | POA: Diagnosis not present

## 2020-06-23 DIAGNOSIS — R262 Difficulty in walking, not elsewhere classified: Secondary | ICD-10-CM | POA: Diagnosis not present

## 2020-06-23 DIAGNOSIS — H548 Legal blindness, as defined in USA: Secondary | ICD-10-CM | POA: Diagnosis not present

## 2020-06-23 DIAGNOSIS — I10 Essential (primary) hypertension: Secondary | ICD-10-CM | POA: Diagnosis not present

## 2020-06-23 DIAGNOSIS — I2510 Atherosclerotic heart disease of native coronary artery without angina pectoris: Secondary | ICD-10-CM | POA: Diagnosis not present

## 2020-06-23 DIAGNOSIS — J449 Chronic obstructive pulmonary disease, unspecified: Secondary | ICD-10-CM | POA: Diagnosis not present

## 2020-06-23 DIAGNOSIS — I5032 Chronic diastolic (congestive) heart failure: Secondary | ICD-10-CM | POA: Diagnosis not present

## 2020-06-23 DIAGNOSIS — M6281 Muscle weakness (generalized): Secondary | ICD-10-CM | POA: Diagnosis not present

## 2020-06-23 DIAGNOSIS — R278 Other lack of coordination: Secondary | ICD-10-CM | POA: Diagnosis not present

## 2020-06-25 DIAGNOSIS — H548 Legal blindness, as defined in USA: Secondary | ICD-10-CM | POA: Diagnosis not present

## 2020-06-25 DIAGNOSIS — M6281 Muscle weakness (generalized): Secondary | ICD-10-CM | POA: Diagnosis not present

## 2020-06-25 DIAGNOSIS — R262 Difficulty in walking, not elsewhere classified: Secondary | ICD-10-CM | POA: Diagnosis not present

## 2020-06-25 DIAGNOSIS — R278 Other lack of coordination: Secondary | ICD-10-CM | POA: Diagnosis not present

## 2020-06-25 DIAGNOSIS — I10 Essential (primary) hypertension: Secondary | ICD-10-CM | POA: Diagnosis not present

## 2020-06-27 DIAGNOSIS — I10 Essential (primary) hypertension: Secondary | ICD-10-CM | POA: Diagnosis not present

## 2020-06-27 DIAGNOSIS — M6281 Muscle weakness (generalized): Secondary | ICD-10-CM | POA: Diagnosis not present

## 2020-06-27 DIAGNOSIS — R262 Difficulty in walking, not elsewhere classified: Secondary | ICD-10-CM | POA: Diagnosis not present

## 2020-06-27 DIAGNOSIS — H548 Legal blindness, as defined in USA: Secondary | ICD-10-CM | POA: Diagnosis not present

## 2020-06-27 DIAGNOSIS — R278 Other lack of coordination: Secondary | ICD-10-CM | POA: Diagnosis not present

## 2020-06-30 DIAGNOSIS — M6281 Muscle weakness (generalized): Secondary | ICD-10-CM | POA: Diagnosis not present

## 2020-06-30 DIAGNOSIS — I10 Essential (primary) hypertension: Secondary | ICD-10-CM | POA: Diagnosis not present

## 2020-06-30 DIAGNOSIS — R262 Difficulty in walking, not elsewhere classified: Secondary | ICD-10-CM | POA: Diagnosis not present

## 2020-06-30 DIAGNOSIS — R278 Other lack of coordination: Secondary | ICD-10-CM | POA: Diagnosis not present

## 2020-06-30 DIAGNOSIS — H548 Legal blindness, as defined in USA: Secondary | ICD-10-CM | POA: Diagnosis not present

## 2020-07-01 DIAGNOSIS — H547 Unspecified visual loss: Secondary | ICD-10-CM | POA: Diagnosis not present

## 2020-07-01 DIAGNOSIS — E119 Type 2 diabetes mellitus without complications: Secondary | ICD-10-CM | POA: Diagnosis not present

## 2020-07-01 DIAGNOSIS — I10 Essential (primary) hypertension: Secondary | ICD-10-CM | POA: Diagnosis not present

## 2020-07-01 DIAGNOSIS — J449 Chronic obstructive pulmonary disease, unspecified: Secondary | ICD-10-CM | POA: Diagnosis not present

## 2020-07-01 DIAGNOSIS — E039 Hypothyroidism, unspecified: Secondary | ICD-10-CM | POA: Diagnosis not present

## 2020-07-01 DIAGNOSIS — E785 Hyperlipidemia, unspecified: Secondary | ICD-10-CM | POA: Diagnosis not present

## 2020-07-01 DIAGNOSIS — I5030 Unspecified diastolic (congestive) heart failure: Secondary | ICD-10-CM | POA: Diagnosis not present

## 2020-07-02 DIAGNOSIS — R262 Difficulty in walking, not elsewhere classified: Secondary | ICD-10-CM | POA: Diagnosis not present

## 2020-07-02 DIAGNOSIS — H548 Legal blindness, as defined in USA: Secondary | ICD-10-CM | POA: Diagnosis not present

## 2020-07-02 DIAGNOSIS — R278 Other lack of coordination: Secondary | ICD-10-CM | POA: Diagnosis not present

## 2020-07-02 DIAGNOSIS — I10 Essential (primary) hypertension: Secondary | ICD-10-CM | POA: Diagnosis not present

## 2020-07-02 DIAGNOSIS — M6281 Muscle weakness (generalized): Secondary | ICD-10-CM | POA: Diagnosis not present

## 2020-07-03 ENCOUNTER — Encounter: Payer: Self-pay | Admitting: Cardiology

## 2020-07-03 ENCOUNTER — Ambulatory Visit (INDEPENDENT_AMBULATORY_CARE_PROVIDER_SITE_OTHER): Payer: Medicare Other | Admitting: Cardiology

## 2020-07-03 VITALS — BP 120/64 | HR 48 | Ht 60.0 in | Wt 210.4 lb

## 2020-07-03 DIAGNOSIS — M6281 Muscle weakness (generalized): Secondary | ICD-10-CM | POA: Diagnosis not present

## 2020-07-03 DIAGNOSIS — I10 Essential (primary) hypertension: Secondary | ICD-10-CM | POA: Diagnosis not present

## 2020-07-03 DIAGNOSIS — H548 Legal blindness, as defined in USA: Secondary | ICD-10-CM | POA: Diagnosis not present

## 2020-07-03 DIAGNOSIS — I2510 Atherosclerotic heart disease of native coronary artery without angina pectoris: Secondary | ICD-10-CM

## 2020-07-03 DIAGNOSIS — E782 Mixed hyperlipidemia: Secondary | ICD-10-CM | POA: Diagnosis not present

## 2020-07-03 DIAGNOSIS — R278 Other lack of coordination: Secondary | ICD-10-CM | POA: Diagnosis not present

## 2020-07-03 DIAGNOSIS — R262 Difficulty in walking, not elsewhere classified: Secondary | ICD-10-CM | POA: Diagnosis not present

## 2020-07-03 MED ORDER — METOPROLOL SUCCINATE ER 25 MG PO TB24: 25.0000 mg | ORAL_TABLET | Freq: Every day | ORAL | 3 refills | Status: DC

## 2020-07-03 NOTE — Patient Instructions (Signed)
Medication Instructions:   Your physician has recommended you make the following change in your medication:  Decrease metoprolol succinate (toprol xl) to 25 mg daily  Continue other medications the same  Labwork:  none  Testing/Procedures:  none  Follow-Up:  Your physician recommends that you schedule a follow-up appointment in: 6 months.  Any Other Special Instructions Will Be Listed Below (If Applicable).  If you need a refill on your cardiac medications before your next appointment, please call your pharmacy.
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
## 2020-07-07 DIAGNOSIS — Z79899 Other long term (current) drug therapy: Secondary | ICD-10-CM | POA: Diagnosis not present

## 2020-07-07 DIAGNOSIS — R7989 Other specified abnormal findings of blood chemistry: Secondary | ICD-10-CM | POA: Diagnosis not present

## 2020-07-07 DIAGNOSIS — R262 Difficulty in walking, not elsewhere classified: Secondary | ICD-10-CM | POA: Diagnosis not present

## 2020-07-07 DIAGNOSIS — M6281 Muscle weakness (generalized): Secondary | ICD-10-CM | POA: Diagnosis not present

## 2020-07-07 DIAGNOSIS — R278 Other lack of coordination: Secondary | ICD-10-CM | POA: Diagnosis not present

## 2020-07-07 DIAGNOSIS — H548 Legal blindness, as defined in USA: Secondary | ICD-10-CM | POA: Diagnosis not present

## 2020-07-07 DIAGNOSIS — I10 Essential (primary) hypertension: Secondary | ICD-10-CM | POA: Diagnosis not present

## 2020-07-09 DIAGNOSIS — R945 Abnormal results of liver function studies: Secondary | ICD-10-CM | POA: Diagnosis not present

## 2020-07-09 DIAGNOSIS — R262 Difficulty in walking, not elsewhere classified: Secondary | ICD-10-CM | POA: Diagnosis not present

## 2020-07-09 DIAGNOSIS — M6281 Muscle weakness (generalized): Secondary | ICD-10-CM | POA: Diagnosis not present

## 2020-07-09 DIAGNOSIS — I10 Essential (primary) hypertension: Secondary | ICD-10-CM | POA: Diagnosis not present

## 2020-07-09 DIAGNOSIS — R278 Other lack of coordination: Secondary | ICD-10-CM | POA: Diagnosis not present

## 2020-07-09 DIAGNOSIS — H548 Legal blindness, as defined in USA: Secondary | ICD-10-CM | POA: Diagnosis not present

## 2020-07-11 DIAGNOSIS — M6281 Muscle weakness (generalized): Secondary | ICD-10-CM | POA: Diagnosis not present

## 2020-07-11 DIAGNOSIS — I10 Essential (primary) hypertension: Secondary | ICD-10-CM | POA: Diagnosis not present

## 2020-07-11 DIAGNOSIS — R278 Other lack of coordination: Secondary | ICD-10-CM | POA: Diagnosis not present

## 2020-07-11 DIAGNOSIS — H548 Legal blindness, as defined in USA: Secondary | ICD-10-CM | POA: Diagnosis not present

## 2020-07-11 DIAGNOSIS — R262 Difficulty in walking, not elsewhere classified: Secondary | ICD-10-CM | POA: Diagnosis not present

## 2020-07-14 DIAGNOSIS — H548 Legal blindness, as defined in USA: Secondary | ICD-10-CM | POA: Diagnosis not present

## 2020-07-14 DIAGNOSIS — R262 Difficulty in walking, not elsewhere classified: Secondary | ICD-10-CM | POA: Diagnosis not present

## 2020-07-14 DIAGNOSIS — R278 Other lack of coordination: Secondary | ICD-10-CM | POA: Diagnosis not present

## 2020-07-14 DIAGNOSIS — M6281 Muscle weakness (generalized): Secondary | ICD-10-CM | POA: Diagnosis not present

## 2020-07-14 DIAGNOSIS — I10 Essential (primary) hypertension: Secondary | ICD-10-CM | POA: Diagnosis not present

## 2020-07-16 DIAGNOSIS — M6281 Muscle weakness (generalized): Secondary | ICD-10-CM | POA: Diagnosis not present

## 2020-07-16 DIAGNOSIS — I10 Essential (primary) hypertension: Secondary | ICD-10-CM | POA: Diagnosis not present

## 2020-07-16 DIAGNOSIS — R262 Difficulty in walking, not elsewhere classified: Secondary | ICD-10-CM | POA: Diagnosis not present

## 2020-07-16 DIAGNOSIS — H548 Legal blindness, as defined in USA: Secondary | ICD-10-CM | POA: Diagnosis not present

## 2020-07-16 DIAGNOSIS — R278 Other lack of coordination: Secondary | ICD-10-CM | POA: Diagnosis not present

## 2020-07-17 DIAGNOSIS — D518 Other vitamin B12 deficiency anemias: Secondary | ICD-10-CM | POA: Diagnosis not present

## 2020-07-17 DIAGNOSIS — E039 Hypothyroidism, unspecified: Secondary | ICD-10-CM | POA: Diagnosis not present

## 2020-07-17 DIAGNOSIS — E785 Hyperlipidemia, unspecified: Secondary | ICD-10-CM | POA: Diagnosis not present

## 2020-07-17 DIAGNOSIS — E119 Type 2 diabetes mellitus without complications: Secondary | ICD-10-CM | POA: Diagnosis not present

## 2020-07-17 DIAGNOSIS — I10 Essential (primary) hypertension: Secondary | ICD-10-CM | POA: Diagnosis not present

## 2020-07-17 DIAGNOSIS — I2510 Atherosclerotic heart disease of native coronary artery without angina pectoris: Secondary | ICD-10-CM | POA: Diagnosis not present

## 2020-07-17 DIAGNOSIS — E038 Other specified hypothyroidism: Secondary | ICD-10-CM | POA: Diagnosis not present

## 2020-07-17 DIAGNOSIS — I5030 Unspecified diastolic (congestive) heart failure: Secondary | ICD-10-CM | POA: Diagnosis not present

## 2020-07-17 DIAGNOSIS — J449 Chronic obstructive pulmonary disease, unspecified: Secondary | ICD-10-CM | POA: Diagnosis not present

## 2020-07-17 DIAGNOSIS — E559 Vitamin D deficiency, unspecified: Secondary | ICD-10-CM | POA: Diagnosis not present

## 2020-07-18 DIAGNOSIS — R278 Other lack of coordination: Secondary | ICD-10-CM | POA: Diagnosis not present

## 2020-07-18 DIAGNOSIS — R262 Difficulty in walking, not elsewhere classified: Secondary | ICD-10-CM | POA: Diagnosis not present

## 2020-07-18 DIAGNOSIS — I10 Essential (primary) hypertension: Secondary | ICD-10-CM | POA: Diagnosis not present

## 2020-07-18 DIAGNOSIS — H548 Legal blindness, as defined in USA: Secondary | ICD-10-CM | POA: Diagnosis not present

## 2020-07-18 DIAGNOSIS — M6281 Muscle weakness (generalized): Secondary | ICD-10-CM | POA: Diagnosis not present

## 2020-07-21 DIAGNOSIS — H548 Legal blindness, as defined in USA: Secondary | ICD-10-CM | POA: Diagnosis not present

## 2020-07-21 DIAGNOSIS — I10 Essential (primary) hypertension: Secondary | ICD-10-CM | POA: Diagnosis not present

## 2020-07-21 DIAGNOSIS — R262 Difficulty in walking, not elsewhere classified: Secondary | ICD-10-CM | POA: Diagnosis not present

## 2020-07-21 DIAGNOSIS — M6281 Muscle weakness (generalized): Secondary | ICD-10-CM | POA: Diagnosis not present

## 2020-07-21 DIAGNOSIS — R278 Other lack of coordination: Secondary | ICD-10-CM | POA: Diagnosis not present

## 2020-07-23 DIAGNOSIS — R262 Difficulty in walking, not elsewhere classified: Secondary | ICD-10-CM | POA: Diagnosis not present

## 2020-07-23 DIAGNOSIS — H548 Legal blindness, as defined in USA: Secondary | ICD-10-CM | POA: Diagnosis not present

## 2020-07-23 DIAGNOSIS — I10 Essential (primary) hypertension: Secondary | ICD-10-CM | POA: Diagnosis not present

## 2020-07-23 DIAGNOSIS — R278 Other lack of coordination: Secondary | ICD-10-CM | POA: Diagnosis not present

## 2020-07-23 DIAGNOSIS — M6281 Muscle weakness (generalized): Secondary | ICD-10-CM | POA: Diagnosis not present

## 2020-07-24 DIAGNOSIS — J95821 Acute postprocedural respiratory failure: Secondary | ICD-10-CM | POA: Diagnosis not present

## 2020-07-24 DIAGNOSIS — I2510 Atherosclerotic heart disease of native coronary artery without angina pectoris: Secondary | ICD-10-CM | POA: Diagnosis not present

## 2020-07-24 DIAGNOSIS — J449 Chronic obstructive pulmonary disease, unspecified: Secondary | ICD-10-CM | POA: Diagnosis not present

## 2020-07-24 DIAGNOSIS — I5032 Chronic diastolic (congestive) heart failure: Secondary | ICD-10-CM | POA: Diagnosis not present

## 2020-07-25 DIAGNOSIS — H548 Legal blindness, as defined in USA: Secondary | ICD-10-CM | POA: Diagnosis not present

## 2020-07-25 DIAGNOSIS — M6281 Muscle weakness (generalized): Secondary | ICD-10-CM | POA: Diagnosis not present

## 2020-07-25 DIAGNOSIS — R262 Difficulty in walking, not elsewhere classified: Secondary | ICD-10-CM | POA: Diagnosis not present

## 2020-07-25 DIAGNOSIS — R278 Other lack of coordination: Secondary | ICD-10-CM | POA: Diagnosis not present

## 2020-07-25 DIAGNOSIS — I10 Essential (primary) hypertension: Secondary | ICD-10-CM | POA: Diagnosis not present

## 2020-07-28 DIAGNOSIS — I10 Essential (primary) hypertension: Secondary | ICD-10-CM | POA: Diagnosis not present

## 2020-07-28 DIAGNOSIS — M6281 Muscle weakness (generalized): Secondary | ICD-10-CM | POA: Diagnosis not present

## 2020-07-28 DIAGNOSIS — R262 Difficulty in walking, not elsewhere classified: Secondary | ICD-10-CM | POA: Diagnosis not present

## 2020-07-28 DIAGNOSIS — R278 Other lack of coordination: Secondary | ICD-10-CM | POA: Diagnosis not present

## 2020-07-28 DIAGNOSIS — H548 Legal blindness, as defined in USA: Secondary | ICD-10-CM | POA: Diagnosis not present

## 2020-08-05 DIAGNOSIS — Z79899 Other long term (current) drug therapy: Secondary | ICD-10-CM | POA: Diagnosis not present

## 2020-08-05 DIAGNOSIS — R7989 Other specified abnormal findings of blood chemistry: Secondary | ICD-10-CM | POA: Diagnosis not present

## 2020-08-08 DIAGNOSIS — E119 Type 2 diabetes mellitus without complications: Secondary | ICD-10-CM | POA: Diagnosis not present

## 2020-08-08 DIAGNOSIS — U071 COVID-19: Secondary | ICD-10-CM | POA: Diagnosis not present

## 2020-08-08 DIAGNOSIS — B351 Tinea unguium: Secondary | ICD-10-CM | POA: Diagnosis not present

## 2020-08-13 DIAGNOSIS — H547 Unspecified visual loss: Secondary | ICD-10-CM | POA: Diagnosis not present

## 2020-08-13 DIAGNOSIS — E039 Hypothyroidism, unspecified: Secondary | ICD-10-CM | POA: Diagnosis not present

## 2020-08-13 DIAGNOSIS — E785 Hyperlipidemia, unspecified: Secondary | ICD-10-CM | POA: Diagnosis not present

## 2020-08-13 DIAGNOSIS — K219 Gastro-esophageal reflux disease without esophagitis: Secondary | ICD-10-CM | POA: Diagnosis not present

## 2020-08-13 DIAGNOSIS — E119 Type 2 diabetes mellitus without complications: Secondary | ICD-10-CM | POA: Diagnosis not present

## 2020-08-13 DIAGNOSIS — I2510 Atherosclerotic heart disease of native coronary artery without angina pectoris: Secondary | ICD-10-CM | POA: Diagnosis not present

## 2020-08-13 DIAGNOSIS — E559 Vitamin D deficiency, unspecified: Secondary | ICD-10-CM | POA: Diagnosis not present

## 2020-08-13 DIAGNOSIS — I5030 Unspecified diastolic (congestive) heart failure: Secondary | ICD-10-CM | POA: Diagnosis not present

## 2020-08-17 DIAGNOSIS — E119 Type 2 diabetes mellitus without complications: Secondary | ICD-10-CM | POA: Diagnosis not present

## 2020-08-20 DIAGNOSIS — E559 Vitamin D deficiency, unspecified: Secondary | ICD-10-CM | POA: Diagnosis not present

## 2020-08-20 DIAGNOSIS — I10 Essential (primary) hypertension: Secondary | ICD-10-CM | POA: Diagnosis not present

## 2020-08-20 DIAGNOSIS — D518 Other vitamin B12 deficiency anemias: Secondary | ICD-10-CM | POA: Diagnosis not present

## 2020-08-20 DIAGNOSIS — I2510 Atherosclerotic heart disease of native coronary artery without angina pectoris: Secondary | ICD-10-CM | POA: Diagnosis not present

## 2020-08-20 DIAGNOSIS — E039 Hypothyroidism, unspecified: Secondary | ICD-10-CM | POA: Diagnosis not present

## 2020-08-20 DIAGNOSIS — E038 Other specified hypothyroidism: Secondary | ICD-10-CM | POA: Diagnosis not present

## 2020-08-20 DIAGNOSIS — J449 Chronic obstructive pulmonary disease, unspecified: Secondary | ICD-10-CM | POA: Diagnosis not present

## 2020-08-20 DIAGNOSIS — E785 Hyperlipidemia, unspecified: Secondary | ICD-10-CM | POA: Diagnosis not present

## 2020-08-20 DIAGNOSIS — E119 Type 2 diabetes mellitus without complications: Secondary | ICD-10-CM | POA: Diagnosis not present

## 2020-08-20 DIAGNOSIS — I5030 Unspecified diastolic (congestive) heart failure: Secondary | ICD-10-CM | POA: Diagnosis not present
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
## 2020-08-21 DIAGNOSIS — F5102 Adjustment insomnia: Secondary | ICD-10-CM | POA: Diagnosis not present

## 2020-08-24 DIAGNOSIS — E119 Type 2 diabetes mellitus without complications: Secondary | ICD-10-CM | POA: Diagnosis not present

## 2020-08-30 DIAGNOSIS — E119 Type 2 diabetes mellitus without complications: Secondary | ICD-10-CM | POA: Diagnosis not present

## 2020-09-10 DIAGNOSIS — D649 Anemia, unspecified: Secondary | ICD-10-CM | POA: Diagnosis not present

## 2020-09-10 DIAGNOSIS — E785 Hyperlipidemia, unspecified: Secondary | ICD-10-CM | POA: Diagnosis not present

## 2020-09-10 DIAGNOSIS — E559 Vitamin D deficiency, unspecified: Secondary | ICD-10-CM | POA: Diagnosis not present

## 2020-09-10 DIAGNOSIS — K219 Gastro-esophageal reflux disease without esophagitis: Secondary | ICD-10-CM | POA: Diagnosis not present

## 2020-09-10 DIAGNOSIS — I10 Essential (primary) hypertension: Secondary | ICD-10-CM | POA: Diagnosis not present

## 2020-09-10 DIAGNOSIS — I2510 Atherosclerotic heart disease of native coronary artery without angina pectoris: Secondary | ICD-10-CM | POA: Diagnosis not present

## 2020-09-11 DIAGNOSIS — F5102 Adjustment insomnia: Secondary | ICD-10-CM | POA: Diagnosis not present

## 2020-09-22 DIAGNOSIS — J449 Chronic obstructive pulmonary disease, unspecified: Secondary | ICD-10-CM | POA: Diagnosis not present

## 2020-09-22 DIAGNOSIS — E038 Other specified hypothyroidism: Secondary | ICD-10-CM | POA: Diagnosis not present

## 2020-09-22 DIAGNOSIS — E039 Hypothyroidism, unspecified: Secondary | ICD-10-CM | POA: Diagnosis not present

## 2020-09-22 DIAGNOSIS — E559 Vitamin D deficiency, unspecified: Secondary | ICD-10-CM | POA: Diagnosis not present

## 2020-09-22 DIAGNOSIS — E785 Hyperlipidemia, unspecified: Secondary | ICD-10-CM | POA: Diagnosis not present

## 2020-09-22 DIAGNOSIS — I5030 Unspecified diastolic (congestive) heart failure: Secondary | ICD-10-CM | POA: Diagnosis not present

## 2020-09-22 DIAGNOSIS — I10 Essential (primary) hypertension: Secondary | ICD-10-CM | POA: Diagnosis not present

## 2020-09-22 DIAGNOSIS — E119 Type 2 diabetes mellitus without complications: Secondary | ICD-10-CM | POA: Diagnosis not present

## 2020-09-22 DIAGNOSIS — I2510 Atherosclerotic heart disease of native coronary artery without angina pectoris: Secondary | ICD-10-CM | POA: Diagnosis not present

## 2020-09-22 DIAGNOSIS — D518 Other vitamin B12 deficiency anemias: Secondary | ICD-10-CM | POA: Diagnosis not present

## 2020-10-08 DIAGNOSIS — I10 Essential (primary) hypertension: Secondary | ICD-10-CM | POA: Diagnosis not present

## 2020-10-08 DIAGNOSIS — I5030 Unspecified diastolic (congestive) heart failure: Secondary | ICD-10-CM | POA: Diagnosis not present

## 2020-10-08 DIAGNOSIS — E039 Hypothyroidism, unspecified: Secondary | ICD-10-CM | POA: Diagnosis not present

## 2020-10-08 DIAGNOSIS — G629 Polyneuropathy, unspecified: Secondary | ICD-10-CM | POA: Diagnosis not present

## 2020-10-08 DIAGNOSIS — K589 Irritable bowel syndrome without diarrhea: Secondary | ICD-10-CM | POA: Diagnosis not present

## 2020-10-08 DIAGNOSIS — D649 Anemia, unspecified: Secondary | ICD-10-CM | POA: Diagnosis not present

## 2020-10-08 DIAGNOSIS — E119 Type 2 diabetes mellitus without complications: Secondary | ICD-10-CM | POA: Diagnosis not present

## 2020-10-08 DIAGNOSIS — K219 Gastro-esophageal reflux disease without esophagitis: Secondary | ICD-10-CM | POA: Diagnosis not present

## 2020-10-08 DIAGNOSIS — E785 Hyperlipidemia, unspecified: Secondary | ICD-10-CM | POA: Diagnosis not present

## 2020-10-08 DIAGNOSIS — E559 Vitamin D deficiency, unspecified: Secondary | ICD-10-CM | POA: Diagnosis not present

## 2020-10-14 DIAGNOSIS — I5030 Unspecified diastolic (congestive) heart failure: Secondary | ICD-10-CM | POA: Diagnosis not present

## 2020-10-14 DIAGNOSIS — E785 Hyperlipidemia, unspecified: Secondary | ICD-10-CM | POA: Diagnosis not present

## 2020-10-14 DIAGNOSIS — I2510 Atherosclerotic heart disease of native coronary artery without angina pectoris: Secondary | ICD-10-CM | POA: Diagnosis not present

## 2020-10-14 DIAGNOSIS — E039 Hypothyroidism, unspecified: Secondary | ICD-10-CM | POA: Diagnosis not present

## 2020-10-14 DIAGNOSIS — E119 Type 2 diabetes mellitus without complications: Secondary | ICD-10-CM | POA: Diagnosis not present

## 2020-10-14 DIAGNOSIS — E038 Other specified hypothyroidism: Secondary | ICD-10-CM | POA: Diagnosis not present

## 2020-10-14 DIAGNOSIS — I10 Essential (primary) hypertension: Secondary | ICD-10-CM | POA: Diagnosis not present

## 2020-10-14 DIAGNOSIS — E559 Vitamin D deficiency, unspecified: Secondary | ICD-10-CM | POA: Diagnosis not present

## 2020-10-14 DIAGNOSIS — J449 Chronic obstructive pulmonary disease, unspecified: Secondary | ICD-10-CM | POA: Diagnosis not present

## 2020-10-14 DIAGNOSIS — D518 Other vitamin B12 deficiency anemias: Secondary | ICD-10-CM | POA: Diagnosis not present

## 2020-10-15 ENCOUNTER — Ambulatory Visit (INDEPENDENT_AMBULATORY_CARE_PROVIDER_SITE_OTHER): Payer: Medicare Other | Admitting: Gastroenterology

## 2020-11-05 DIAGNOSIS — E785 Hyperlipidemia, unspecified: Secondary | ICD-10-CM | POA: Diagnosis not present

## 2020-11-05 DIAGNOSIS — I10 Essential (primary) hypertension: Secondary | ICD-10-CM | POA: Diagnosis not present

## 2020-11-05 DIAGNOSIS — E039 Hypothyroidism, unspecified: Secondary | ICD-10-CM | POA: Diagnosis not present

## 2020-11-05 DIAGNOSIS — E119 Type 2 diabetes mellitus without complications: Secondary | ICD-10-CM | POA: Diagnosis not present

## 2020-11-05 DIAGNOSIS — K219 Gastro-esophageal reflux disease without esophagitis: Secondary | ICD-10-CM | POA: Diagnosis not present

## 2020-11-05 DIAGNOSIS — E559 Vitamin D deficiency, unspecified: Secondary | ICD-10-CM | POA: Diagnosis not present

## 2020-11-05 DIAGNOSIS — D649 Anemia, unspecified: Secondary | ICD-10-CM | POA: Diagnosis not present

## 2020-11-06 DIAGNOSIS — E119 Type 2 diabetes mellitus without complications: Secondary | ICD-10-CM | POA: Diagnosis not present

## 2020-11-06 DIAGNOSIS — J449 Chronic obstructive pulmonary disease, unspecified: Secondary | ICD-10-CM | POA: Diagnosis not present

## 2020-11-06 DIAGNOSIS — I10 Essential (primary) hypertension: Secondary | ICD-10-CM | POA: Diagnosis not present

## 2020-11-06 DIAGNOSIS — E559 Vitamin D deficiency, unspecified: Secondary | ICD-10-CM | POA: Diagnosis not present

## 2020-11-06 DIAGNOSIS — D518 Other vitamin B12 deficiency anemias: Secondary | ICD-10-CM | POA: Diagnosis not present

## 2020-11-06 DIAGNOSIS — E039 Hypothyroidism, unspecified: Secondary | ICD-10-CM | POA: Diagnosis not present

## 2020-11-06 DIAGNOSIS — E038 Other specified hypothyroidism: Secondary | ICD-10-CM | POA: Diagnosis not present

## 2020-11-06 DIAGNOSIS — I2510 Atherosclerotic heart disease of native coronary artery without angina pectoris: Secondary | ICD-10-CM | POA: Diagnosis not present

## 2020-11-06 DIAGNOSIS — E785 Hyperlipidemia, unspecified: Secondary | ICD-10-CM | POA: Diagnosis not present

## 2020-11-06 DIAGNOSIS — I5030 Unspecified diastolic (congestive) heart failure: Secondary | ICD-10-CM | POA: Diagnosis not present

## 2020-11-27 DIAGNOSIS — I10 Essential (primary) hypertension: Secondary | ICD-10-CM | POA: Diagnosis not present

## 2020-12-02 DIAGNOSIS — G629 Polyneuropathy, unspecified: Secondary | ICD-10-CM | POA: Diagnosis not present

## 2020-12-02 DIAGNOSIS — J449 Chronic obstructive pulmonary disease, unspecified: Secondary | ICD-10-CM | POA: Diagnosis not present

## 2020-12-02 DIAGNOSIS — E559 Vitamin D deficiency, unspecified: Secondary | ICD-10-CM | POA: Diagnosis not present

## 2020-12-02 DIAGNOSIS — E039 Hypothyroidism, unspecified: Secondary | ICD-10-CM | POA: Diagnosis not present

## 2020-12-02 DIAGNOSIS — E119 Type 2 diabetes mellitus without complications: Secondary | ICD-10-CM | POA: Diagnosis not present

## 2020-12-02 DIAGNOSIS — D649 Anemia, unspecified: Secondary | ICD-10-CM | POA: Diagnosis not present

## 2020-12-02 DIAGNOSIS — I5030 Unspecified diastolic (congestive) heart failure: Secondary | ICD-10-CM | POA: Diagnosis not present

## 2020-12-02 DIAGNOSIS — E785 Hyperlipidemia, unspecified: Secondary | ICD-10-CM | POA: Diagnosis not present

## 2020-12-02 DIAGNOSIS — K219 Gastro-esophageal reflux disease without esophagitis: Secondary | ICD-10-CM | POA: Diagnosis not present

## 2020-12-07 DIAGNOSIS — R739 Hyperglycemia, unspecified: Secondary | ICD-10-CM | POA: Diagnosis not present

## 2020-12-11 ENCOUNTER — Other Ambulatory Visit: Payer: Self-pay

## 2020-12-11 ENCOUNTER — Ambulatory Visit (INDEPENDENT_AMBULATORY_CARE_PROVIDER_SITE_OTHER): Payer: Medicare Other | Admitting: Gastroenterology

## 2020-12-11 ENCOUNTER — Encounter (INDEPENDENT_AMBULATORY_CARE_PROVIDER_SITE_OTHER): Payer: Self-pay | Admitting: Gastroenterology

## 2020-12-11 DIAGNOSIS — R7989 Other specified abnormal findings of blood chemistry: Secondary | ICD-10-CM

## 2020-12-11 DIAGNOSIS — K7581 Nonalcoholic steatohepatitis (NASH): Secondary | ICD-10-CM | POA: Diagnosis not present

## 2020-12-11 NOTE — Patient Instructions (Signed)
Perform blood workup  
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
## 2020-12-15 ENCOUNTER — Encounter (INDEPENDENT_AMBULATORY_CARE_PROVIDER_SITE_OTHER): Payer: Self-pay

## 2020-12-15 DIAGNOSIS — E038 Other specified hypothyroidism: Secondary | ICD-10-CM | POA: Diagnosis not present

## 2020-12-15 DIAGNOSIS — I10 Essential (primary) hypertension: Secondary | ICD-10-CM | POA: Diagnosis not present

## 2020-12-15 DIAGNOSIS — E119 Type 2 diabetes mellitus without complications: Secondary | ICD-10-CM | POA: Diagnosis not present

## 2020-12-15 DIAGNOSIS — I2510 Atherosclerotic heart disease of native coronary artery without angina pectoris: Secondary | ICD-10-CM | POA: Diagnosis not present

## 2020-12-15 DIAGNOSIS — E039 Hypothyroidism, unspecified: Secondary | ICD-10-CM | POA: Diagnosis not present

## 2020-12-15 DIAGNOSIS — D518 Other vitamin B12 deficiency anemias: Secondary | ICD-10-CM | POA: Diagnosis not present

## 2020-12-15 DIAGNOSIS — E559 Vitamin D deficiency, unspecified: Secondary | ICD-10-CM | POA: Diagnosis not present

## 2020-12-15 DIAGNOSIS — J449 Chronic obstructive pulmonary disease, unspecified: Secondary | ICD-10-CM | POA: Diagnosis not present

## 2020-12-15 DIAGNOSIS — E785 Hyperlipidemia, unspecified: Secondary | ICD-10-CM | POA: Diagnosis not present

## 2020-12-15 DIAGNOSIS — I5030 Unspecified diastolic (congestive) heart failure: Secondary | ICD-10-CM | POA: Diagnosis not present

## 2020-12-15 DIAGNOSIS — E1165 Type 2 diabetes mellitus with hyperglycemia: Secondary | ICD-10-CM | POA: Diagnosis not present

## 2020-12-19 LAB — COMPREHENSIVE METABOLIC PANEL
AG Ratio: 0.5 (calc) — ABNORMAL LOW (ref 1.0–2.5)
ALT: 53 U/L — ABNORMAL HIGH (ref 6–29)
AST: 88 U/L — ABNORMAL HIGH (ref 10–35)
Albumin: 3.3 g/dL — ABNORMAL LOW (ref 3.6–5.1)
Alkaline phosphatase (APISO): 120 U/L (ref 37–153)
BUN/Creatinine Ratio: 18 (calc) (ref 6–22)
BUN: 22 mg/dL (ref 7–25)
CO2: 27 mmol/L (ref 20–32)
Calcium: 9.4 mg/dL (ref 8.6–10.4)
Chloride: 99 mmol/L (ref 98–110)
Creat: 1.22 mg/dL — ABNORMAL HIGH (ref 0.60–1.00)
Globulin: 6.3 g/dL (calc) — ABNORMAL HIGH (ref 1.9–3.7)
Glucose, Bld: 240 mg/dL — ABNORMAL HIGH (ref 65–139)
Potassium: 4.1 mmol/L (ref 3.5–5.3)
Sodium: 133 mmol/L — ABNORMAL LOW (ref 135–146)
Total Bilirubin: 0.3 mg/dL (ref 0.2–1.2)
Total Protein: 9.6 g/dL — ABNORMAL HIGH (ref 6.1–8.1)

## 2020-12-19 LAB — CBC WITH DIFFERENTIAL/PLATELET
Absolute Monocytes: 378 cells/uL (ref 200–950)
Basophils Absolute: 38 cells/uL (ref 0–200)
Basophils Relative: 0.7 %
Eosinophils Absolute: 151 cells/uL (ref 15–500)
Eosinophils Relative: 2.8 %
HCT: 36.7 % (ref 35.0–45.0)
Hemoglobin: 11.9 g/dL (ref 11.7–15.5)
Lymphs Abs: 1787 cells/uL (ref 850–3900)
MCH: 30.8 pg (ref 27.0–33.0)
MCHC: 32.4 g/dL (ref 32.0–36.0)
MCV: 95.1 fL (ref 80.0–100.0)
MPV: 10.8 fL (ref 7.5–12.5)
Monocytes Relative: 7 %
Neutro Abs: 3046 cells/uL (ref 1500–7800)
Neutrophils Relative %: 56.4 %
Platelets: 205 10*3/uL (ref 140–400)
RBC: 3.86 10*6/uL (ref 3.80–5.10)
RDW: 14 % (ref 11.0–15.0)
Total Lymphocyte: 33.1 %
WBC: 5.4 10*3/uL (ref 3.8–10.8)

## 2020-12-19 LAB — ANTI-SMOOTH MUSCLE ANTIBODY, IGG: Actin (Smooth Muscle) Antibody (IGG): 27 U — ABNORMAL HIGH (ref <20)

## 2020-12-19 LAB — IGA: Immunoglobulin A: 517 mg/dL — ABNORMAL HIGH (ref 70–320)

## 2020-12-19 LAB — ANA: Anti Nuclear Antibody (ANA): POSITIVE — AB

## 2020-12-19 LAB — IRON, TOTAL/TOTAL IRON BINDING CAP
%SAT: 24 % (calc) (ref 16–45)
Iron: 78 ug/dL (ref 45–160)
TIBC: 319 mcg/dL (calc) (ref 250–450)

## 2020-12-19 LAB — IGG: IgG (Immunoglobin G), Serum: 4473 mg/dL — ABNORMAL HIGH (ref 600–1540)

## 2020-12-19 LAB — HEPATITIS B SURFACE ANTIBODY,QUALITATIVE: Hep B S Ab: NONREACTIVE

## 2020-12-19 LAB — FERRITIN: Ferritin: 108 ng/mL (ref 16–288)

## 2020-12-19 LAB — ANTI-NUCLEAR AB-TITER (ANA TITER): ANA Titer 1: 1:1280 {titer} — ABNORMAL HIGH

## 2020-12-19 LAB — TISSUE TRANSGLUTAMINASE, IGA: (tTG) Ab, IgA: <1 U/mL

## 2020-12-19 LAB — HEPATITIS A ANTIBODY, TOTAL: Hepatitis A AB,Total: NONREACTIVE

## 2020-12-20 DIAGNOSIS — R739 Hyperglycemia, unspecified: Secondary | ICD-10-CM | POA: Diagnosis not present

## 2020-12-22 DIAGNOSIS — E7849 Other hyperlipidemia: Secondary | ICD-10-CM | POA: Diagnosis not present

## 2020-12-22 DIAGNOSIS — D518 Other vitamin B12 deficiency anemias: Secondary | ICD-10-CM | POA: Diagnosis not present

## 2020-12-22 DIAGNOSIS — E119 Type 2 diabetes mellitus without complications: Secondary | ICD-10-CM | POA: Diagnosis not present

## 2020-12-22 DIAGNOSIS — E038 Other specified hypothyroidism: Secondary | ICD-10-CM | POA: Diagnosis not present

## 2020-12-22 DIAGNOSIS — Z79899 Other long term (current) drug therapy: Secondary | ICD-10-CM | POA: Diagnosis not present

## 2020-12-23 ENCOUNTER — Other Ambulatory Visit (INDEPENDENT_AMBULATORY_CARE_PROVIDER_SITE_OTHER): Payer: Self-pay

## 2020-12-23 DIAGNOSIS — R7989 Other specified abnormal findings of blood chemistry: Secondary | ICD-10-CM

## 2020-12-25 DIAGNOSIS — E785 Hyperlipidemia, unspecified: Secondary | ICD-10-CM | POA: Diagnosis not present

## 2020-12-25 DIAGNOSIS — E119 Type 2 diabetes mellitus without complications: Secondary | ICD-10-CM | POA: Diagnosis not present

## 2020-12-25 DIAGNOSIS — E039 Hypothyroidism, unspecified: Secondary | ICD-10-CM | POA: Diagnosis not present

## 2020-12-25 DIAGNOSIS — E1165 Type 2 diabetes mellitus with hyperglycemia: Secondary | ICD-10-CM | POA: Diagnosis not present

## 2020-12-25 DIAGNOSIS — R7401 Elevation of levels of liver transaminase levels: Secondary | ICD-10-CM | POA: Diagnosis not present

## 2020-12-25 DIAGNOSIS — Z794 Long term (current) use of insulin: Secondary | ICD-10-CM | POA: Diagnosis not present

## 2020-12-25 DIAGNOSIS — Z79899 Other long term (current) drug therapy: Secondary | ICD-10-CM | POA: Diagnosis not present

## 2020-12-25 DIAGNOSIS — E1100 Type 2 diabetes mellitus with hyperosmolarity without nonketotic hyperglycemic-hyperosmolar coma (NKHHC): Secondary | ICD-10-CM | POA: Diagnosis not present

## 2020-12-27 DIAGNOSIS — L03114 Cellulitis of left upper limb: Secondary | ICD-10-CM | POA: Diagnosis not present

## 2020-12-29 ENCOUNTER — Other Ambulatory Visit: Payer: Self-pay | Admitting: Radiology

## 2020-12-30 ENCOUNTER — Other Ambulatory Visit: Payer: Self-pay | Admitting: Radiology

## 2020-12-30 DIAGNOSIS — E785 Hyperlipidemia, unspecified: Secondary | ICD-10-CM | POA: Diagnosis not present

## 2020-12-30 DIAGNOSIS — K219 Gastro-esophageal reflux disease without esophagitis: Secondary | ICD-10-CM | POA: Diagnosis not present

## 2020-12-30 DIAGNOSIS — J449 Chronic obstructive pulmonary disease, unspecified: Secondary | ICD-10-CM | POA: Diagnosis not present

## 2020-12-30 DIAGNOSIS — I2510 Atherosclerotic heart disease of native coronary artery without angina pectoris: Secondary | ICD-10-CM | POA: Diagnosis not present

## 2020-12-30 DIAGNOSIS — I10 Essential (primary) hypertension: Secondary | ICD-10-CM | POA: Diagnosis not present

## 2020-12-30 DIAGNOSIS — E039 Hypothyroidism, unspecified: Secondary | ICD-10-CM | POA: Diagnosis not present

## 2020-12-30 DIAGNOSIS — H547 Unspecified visual loss: Secondary | ICD-10-CM | POA: Diagnosis not present

## 2020-12-30 DIAGNOSIS — E1100 Type 2 diabetes mellitus with hyperosmolarity without nonketotic hyperglycemic-hyperosmolar coma (NKHHC): Secondary | ICD-10-CM | POA: Diagnosis not present

## 2020-12-30 DIAGNOSIS — R011 Cardiac murmur, unspecified: Secondary | ICD-10-CM | POA: Diagnosis not present

## 2020-12-30 DIAGNOSIS — E119 Type 2 diabetes mellitus without complications: Secondary | ICD-10-CM | POA: Diagnosis not present

## 2020-12-31 ENCOUNTER — Encounter (HOSPITAL_COMMUNITY): Payer: Self-pay

## 2020-12-31 ENCOUNTER — Ambulatory Visit (HOSPITAL_COMMUNITY)
Admission: RE | Admit: 2020-12-31 | Discharge: 2020-12-31 | Disposition: A | Discharge status: Home / Self Care | Payer: Medicare Other | Source: Ambulatory Visit | Attending: Gastroenterology | Admitting: Gastroenterology

## 2020-12-31 ENCOUNTER — Other Ambulatory Visit: Payer: Self-pay

## 2020-12-31 DIAGNOSIS — Z955 Presence of coronary angioplasty implant and graft: Secondary | ICD-10-CM | POA: Insufficient documentation

## 2020-12-31 DIAGNOSIS — Z6839 Body mass index (BMI) 39.0-39.9, adult: Secondary | ICD-10-CM | POA: Insufficient documentation

## 2020-12-31 DIAGNOSIS — Z87891 Personal history of nicotine dependence: Secondary | ICD-10-CM | POA: Insufficient documentation

## 2020-12-31 DIAGNOSIS — K219 Gastro-esophageal reflux disease without esophagitis: Secondary | ICD-10-CM | POA: Insufficient documentation

## 2020-12-31 DIAGNOSIS — K754 Autoimmune hepatitis: Secondary | ICD-10-CM | POA: Diagnosis not present

## 2020-12-31 DIAGNOSIS — Z8719 Personal history of other diseases of the digestive system: Secondary | ICD-10-CM | POA: Insufficient documentation

## 2020-12-31 DIAGNOSIS — Z9049 Acquired absence of other specified parts of digestive tract: Secondary | ICD-10-CM | POA: Diagnosis not present

## 2020-12-31 DIAGNOSIS — E669 Obesity, unspecified: Secondary | ICD-10-CM | POA: Diagnosis not present

## 2020-12-31 DIAGNOSIS — F419 Anxiety disorder, unspecified: Secondary | ICD-10-CM | POA: Diagnosis not present

## 2020-12-31 DIAGNOSIS — R7401 Elevation of levels of liver transaminase levels: Secondary | ICD-10-CM | POA: Diagnosis not present

## 2020-12-31 DIAGNOSIS — K589 Irritable bowel syndrome without diarrhea: Secondary | ICD-10-CM | POA: Diagnosis not present

## 2020-12-31 DIAGNOSIS — R7989 Other specified abnormal findings of blood chemistry: Secondary | ICD-10-CM | POA: Diagnosis present

## 2020-12-31 DIAGNOSIS — I2510 Atherosclerotic heart disease of native coronary artery without angina pectoris: Secondary | ICD-10-CM | POA: Diagnosis not present

## 2020-12-31 DIAGNOSIS — E119 Type 2 diabetes mellitus without complications: Secondary | ICD-10-CM | POA: Diagnosis not present

## 2020-12-31 DIAGNOSIS — K760 Fatty (change of) liver, not elsewhere classified: Secondary | ICD-10-CM | POA: Diagnosis not present

## 2020-12-31 DIAGNOSIS — E039 Hypothyroidism, unspecified: Secondary | ICD-10-CM | POA: Insufficient documentation

## 2020-12-31 DIAGNOSIS — H547 Unspecified visual loss: Secondary | ICD-10-CM | POA: Insufficient documentation

## 2020-12-31 DIAGNOSIS — I10 Essential (primary) hypertension: Secondary | ICD-10-CM | POA: Insufficient documentation

## 2020-12-31 DIAGNOSIS — R945 Abnormal results of liver function studies: Secondary | ICD-10-CM | POA: Diagnosis not present

## 2020-12-31 DIAGNOSIS — J449 Chronic obstructive pulmonary disease, unspecified: Secondary | ICD-10-CM | POA: Insufficient documentation

## 2020-12-31 DIAGNOSIS — Z79890 Hormone replacement therapy (postmenopausal): Secondary | ICD-10-CM | POA: Diagnosis not present

## 2020-12-31 DIAGNOSIS — K7581 Nonalcoholic steatohepatitis (NASH): Secondary | ICD-10-CM | POA: Insufficient documentation

## 2020-12-31 DIAGNOSIS — Z794 Long term (current) use of insulin: Secondary | ICD-10-CM | POA: Diagnosis not present

## 2020-12-31 DIAGNOSIS — K5781 Diverticulitis of intestine, part unspecified, with perforation and abscess with bleeding: Secondary | ICD-10-CM | POA: Diagnosis not present

## 2020-12-31 DIAGNOSIS — Z79899 Other long term (current) drug therapy: Secondary | ICD-10-CM | POA: Diagnosis not present

## 2020-12-31 DIAGNOSIS — Z7982 Long term (current) use of aspirin: Secondary | ICD-10-CM | POA: Insufficient documentation

## 2020-12-31 DIAGNOSIS — Z7984 Long term (current) use of oral hypoglycemic drugs: Secondary | ICD-10-CM | POA: Diagnosis not present

## 2020-12-31 DIAGNOSIS — E785 Hyperlipidemia, unspecified: Secondary | ICD-10-CM | POA: Insufficient documentation

## 2020-12-31 DIAGNOSIS — Z885 Allergy status to narcotic agent status: Secondary | ICD-10-CM | POA: Insufficient documentation

## 2020-12-31 LAB — GLUCOSE, CAPILLARY: Glucose-Capillary: 244 mg/dL — ABNORMAL HIGH (ref 70–99)

## 2020-12-31 LAB — CBC
HCT: 35.1 % — ABNORMAL LOW (ref 36.0–46.0)
Hemoglobin: 11.4 g/dL — ABNORMAL LOW (ref 12.0–15.0)
MCH: 31.3 pg (ref 26.0–34.0)
MCHC: 32.5 g/dL (ref 30.0–36.0)
MCV: 96.4 fL (ref 80.0–100.0)
Platelets: 213 10*3/uL (ref 150–400)
RBC: 3.64 MIL/uL — ABNORMAL LOW (ref 3.87–5.11)
RDW: 15.5 % (ref 11.5–15.5)
WBC: 5.2 10*3/uL (ref 4.0–10.5)
nRBC: 0 % (ref 0.0–0.2)

## 2020-12-31 LAB — PROTIME-INR
INR: 1.1 (ref 0.8–1.2)
Prothrombin Time: 13.9 seconds (ref 11.4–15.2)

## 2020-12-31 MED ORDER — MIDAZOLAM HCL 2 MG/2ML IJ SOLN
INTRAMUSCULAR | Status: AC | PRN
  Administered 2020-12-31: 1 mg via INTRAVENOUS

## 2020-12-31 MED ORDER — MIDAZOLAM HCL 2 MG/2ML IJ SOLN
INTRAMUSCULAR | Status: AC
  Filled 2020-12-31: qty 2

## 2020-12-31 MED ORDER — LIDOCAINE HCL (PF) 1 % IJ SOLN
INTRAMUSCULAR | Status: AC
  Filled 2020-12-31: qty 30

## 2020-12-31 MED ORDER — GELATIN ABSORBABLE 12-7 MM EX MISC
CUTANEOUS | Status: AC
  Filled 2020-12-31: qty 1

## 2020-12-31 MED ORDER — FENTANYL CITRATE (PF) 100 MCG/2ML IJ SOLN
INTRAMUSCULAR | Status: AC | PRN
  Administered 2020-12-31: 25 ug via INTRAVENOUS

## 2020-12-31 MED ORDER — FENTANYL CITRATE (PF) 100 MCG/2ML IJ SOLN
INTRAMUSCULAR | Status: AC
  Filled 2020-12-31: qty 2

## 2020-12-31 MED ORDER — SODIUM CHLORIDE 0.9 % IV SOLN: INTRAVENOUS | Status: DC
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
## 2020-12-31 NOTE — Procedures (Signed)
Interventional Radiology Procedure Note  Procedure: Ultrasound guided non-focal liver biopsy  Findings: Please refer to procedural dictation for full description. 15 ga core x 2 from left lobe of liver.  Gelfoam needle track embolization.  Complications: None immediate  Estimated Blood Loss: < 5 mL  Recommendations: 3 hours bedrest.  Follow up Pathology results.   Ruthann Cancer, MD

## 2021-01-01 ENCOUNTER — Ambulatory Visit: Payer: Medicare Other | Admitting: Cardiology

## 2021-01-01 LAB — SURGICAL PATHOLOGY

## 2021-01-02 ENCOUNTER — Other Ambulatory Visit (INDEPENDENT_AMBULATORY_CARE_PROVIDER_SITE_OTHER): Payer: Self-pay | Admitting: Gastroenterology

## 2021-01-02 DIAGNOSIS — K754 Autoimmune hepatitis: Secondary | ICD-10-CM

## 2021-01-02 MED ORDER — PREDNISONE 5 MG PO TABS: 60.0000 mg | ORAL_TABLET | Freq: Every day | ORAL | 0 refills | Status: DC

## 2021-01-05 ENCOUNTER — Telehealth (INDEPENDENT_AMBULATORY_CARE_PROVIDER_SITE_OTHER): Payer: Self-pay | Admitting: *Deleted

## 2021-01-05 ENCOUNTER — Other Ambulatory Visit (INDEPENDENT_AMBULATORY_CARE_PROVIDER_SITE_OTHER): Payer: Self-pay | Admitting: *Deleted

## 2021-01-05 DIAGNOSIS — K754 Autoimmune hepatitis: Secondary | ICD-10-CM

## 2021-01-05 MED ORDER — PREDNISONE 5 MG PO TABS: 60.0000 mg | ORAL_TABLET | Freq: Every day | ORAL | 0 refills | Status: DC

## 2021-01-05 NOTE — Telephone Encounter (Signed)
[name] from [facility] in [place] called and left vm that they never received order for prednisone. I called back and [name] was not available but I spoke to [name] and told her rx was sent in on Friday 8/5 to [facility] for prednisone and she said they use [employer]. I resent rx to [place] and [name] was notified.and I called [facility] and had left on pharmacist vm to cancel rx for pred that was sent in on 8/5 to [facility].

## 2021-01-08 DIAGNOSIS — E559 Vitamin D deficiency, unspecified: Secondary | ICD-10-CM | POA: Diagnosis not present

## 2021-01-08 DIAGNOSIS — J449 Chronic obstructive pulmonary disease, unspecified: Secondary | ICD-10-CM | POA: Diagnosis not present

## 2021-01-08 DIAGNOSIS — E038 Other specified hypothyroidism: Secondary | ICD-10-CM | POA: Diagnosis not present

## 2021-01-08 DIAGNOSIS — E119 Type 2 diabetes mellitus without complications: Secondary | ICD-10-CM | POA: Diagnosis not present

## 2021-01-08 DIAGNOSIS — E785 Hyperlipidemia, unspecified: Secondary | ICD-10-CM | POA: Diagnosis not present

## 2021-01-08 DIAGNOSIS — E1165 Type 2 diabetes mellitus with hyperglycemia: Secondary | ICD-10-CM | POA: Diagnosis not present

## 2021-01-08 DIAGNOSIS — I5030 Unspecified diastolic (congestive) heart failure: Secondary | ICD-10-CM | POA: Diagnosis not present

## 2021-01-08 DIAGNOSIS — D518 Other vitamin B12 deficiency anemias: Secondary | ICD-10-CM | POA: Diagnosis not present

## 2021-01-08 DIAGNOSIS — I2510 Atherosclerotic heart disease of native coronary artery without angina pectoris: Secondary | ICD-10-CM | POA: Diagnosis not present

## 2021-01-08 DIAGNOSIS — E039 Hypothyroidism, unspecified: Secondary | ICD-10-CM | POA: Diagnosis not present

## 2021-01-08 DIAGNOSIS — I10 Essential (primary) hypertension: Secondary | ICD-10-CM | POA: Diagnosis not present

## 2021-01-11 DIAGNOSIS — E1165 Type 2 diabetes mellitus with hyperglycemia: Secondary | ICD-10-CM | POA: Diagnosis not present

## 2021-01-12 ENCOUNTER — Telehealth (INDEPENDENT_AMBULATORY_CARE_PROVIDER_SITE_OTHER): Payer: Self-pay

## 2021-01-12 ENCOUNTER — Other Ambulatory Visit (INDEPENDENT_AMBULATORY_CARE_PROVIDER_SITE_OTHER): Payer: Self-pay

## 2021-01-12 NOTE — Telephone Encounter (Signed)
Tried calling nursing facility no answer.

## 2021-01-12 NOTE — Telephone Encounter (Signed)
That diet is ok. Thanks

## 2021-01-12 NOTE — Telephone Encounter (Signed)
Patient nursing facility called [facility]) stating they had received an order for the patient to start a Mediterranean diet. They do not offer this diet. They only offer a low fat, low cholesterol diet. Per [employer] with other [phone]. Please advise if low fat, low cholesterol diet is ok and I will contact [place]. Thanks

## 2021-01-13 DIAGNOSIS — E119 Type 2 diabetes mellitus without complications: Secondary | ICD-10-CM | POA: Diagnosis not present

## 2021-01-13 DIAGNOSIS — K754 Autoimmune hepatitis: Secondary | ICD-10-CM | POA: Diagnosis not present

## 2021-01-13 DIAGNOSIS — D518 Other vitamin B12 deficiency anemias: Secondary | ICD-10-CM | POA: Diagnosis not present

## 2021-01-13 DIAGNOSIS — Z79899 Other long term (current) drug therapy: Secondary | ICD-10-CM | POA: Diagnosis not present

## 2021-01-13 DIAGNOSIS — E7849 Other hyperlipidemia: Secondary | ICD-10-CM | POA: Diagnosis not present

## 2021-01-14 NOTE — Telephone Encounter (Signed)
Tried calling no answer at the facility.

## 2021-01-16 DIAGNOSIS — I10 Essential (primary) hypertension: Secondary | ICD-10-CM | POA: Diagnosis not present

## 2021-01-19 ENCOUNTER — Other Ambulatory Visit (INDEPENDENT_AMBULATORY_CARE_PROVIDER_SITE_OTHER): Payer: Self-pay | Admitting: Gastroenterology

## 2021-01-19 DIAGNOSIS — K754 Autoimmune hepatitis: Secondary | ICD-10-CM

## 2021-01-19 LAB — COMPREHENSIVE METABOLIC PANEL
ALT: 151 IU/L — ABNORMAL HIGH (ref 0–32)
AST: 231 IU/L — ABNORMAL HIGH (ref 0–40)
Albumin/Globulin Ratio: 0.7 — ABNORMAL LOW (ref 1.2–2.2)
Albumin: 3.8 g/dL (ref 3.7–4.7)
Alkaline Phosphatase: 105 IU/L (ref 44–121)
BUN/Creatinine Ratio: 25 (ref 12–28)
BUN: 26 mg/dL (ref 8–27)
Bilirubin Total: 0.4 mg/dL (ref 0.0–1.2)
CO2: 24 mmol/L (ref 20–29)
Calcium: 9.1 mg/dL (ref 8.7–10.3)
Chloride: 96 mmol/L (ref 96–106)
Creatinine, Ser: 1.04 mg/dL — ABNORMAL HIGH (ref 0.57–1.00)
Globulin, Total: 5.5 g/dL — ABNORMAL HIGH (ref 1.5–4.5)
Glucose: 286 mg/dL — ABNORMAL HIGH (ref 65–99)
Potassium: 4.4 mmol/L (ref 3.5–5.2)
Sodium: 135 mmol/L (ref 134–144)
Total Protein: 9.3 g/dL — ABNORMAL HIGH (ref 6.0–8.5)
eGFR: 57 mL/min/{1.73_m2} — ABNORMAL LOW (ref >59)

## 2021-01-19 LAB — CBC WITH DIFFERENTIAL/PLATELET
Basophils Absolute: 0 10*3/uL (ref 0.0–0.2)
Basos: 0 %
EOS (ABSOLUTE): 0.1 10*3/uL (ref 0.0–0.4)
Eos: 1 %
Hematocrit: 39 % (ref 34.0–46.6)
Hemoglobin: 13 g/dL (ref 11.1–15.9)
Immature Grans (Abs): 0.2 10*3/uL — ABNORMAL HIGH (ref 0.0–0.1)
Immature Granulocytes: 2 %
Lymphocytes Absolute: 1.6 10*3/uL (ref 0.7–3.1)
Lymphs: 18 %
MCH: 31.4 pg (ref 26.6–33.0)
MCHC: 33.3 g/dL (ref 31.5–35.7)
MCV: 94 fL (ref 79–97)
Monocytes Absolute: 0.4 10*3/uL (ref 0.1–0.9)
Monocytes: 4 %
Neutrophils Absolute: 6.9 10*3/uL (ref 1.4–7.0)
Neutrophils: 75 %
Platelets: 267 10*3/uL (ref 150–450)
RBC: 4.14 x10E6/uL (ref 3.77–5.28)
RDW: 13.7 % (ref 11.7–15.4)
WBC: 9.2 10*3/uL (ref 3.4–10.8)

## 2021-01-19 LAB — THIOPURINE METHYLTRANSFERASE (TPMT), RBC: TPMT Activity:: 22.2 Units/mL RBC

## 2021-01-19 MED ORDER — AZATHIOPRINE 50 MG PO TABS: 50.0000 mg | ORAL_TABLET | Freq: Every day | ORAL | 3 refills | Status: DC

## 2021-01-19 NOTE — Telephone Encounter (Signed)
[name] aware patient may use the low fat diet they provide.

## 2021-01-20 ENCOUNTER — Ambulatory Visit (INDEPENDENT_AMBULATORY_CARE_PROVIDER_SITE_OTHER): Payer: Medicare Other | Admitting: Internal Medicine

## 2021-01-20 ENCOUNTER — Encounter (INDEPENDENT_AMBULATORY_CARE_PROVIDER_SITE_OTHER): Payer: Self-pay | Admitting: Internal Medicine

## 2021-01-20 ENCOUNTER — Other Ambulatory Visit: Payer: Self-pay

## 2021-01-20 VITALS — BP 126/55 | HR 56 | Temp 98.3°F | Ht 60.0 in | Wt 210.0 lb

## 2021-01-20 DIAGNOSIS — E1165 Type 2 diabetes mellitus with hyperglycemia: Secondary | ICD-10-CM | POA: Diagnosis not present

## 2021-01-20 DIAGNOSIS — K7581 Nonalcoholic steatohepatitis (NASH): Secondary | ICD-10-CM | POA: Diagnosis not present

## 2021-01-20 DIAGNOSIS — R739 Hyperglycemia, unspecified: Secondary | ICD-10-CM | POA: Diagnosis not present

## 2021-01-20 DIAGNOSIS — K754 Autoimmune hepatitis: Secondary | ICD-10-CM | POA: Insufficient documentation

## 2021-01-20 DIAGNOSIS — Z79899 Other long term (current) drug therapy: Secondary | ICD-10-CM | POA: Diagnosis not present

## 2021-01-20 LAB — CBC WITH DIFFERENTIAL/PLATELET
Absolute Monocytes: 337 cells/uL (ref 200–950)
Basophils Absolute: 31 cells/uL (ref 0–200)
Basophils Relative: 0.3 %
Eosinophils Absolute: 41 cells/uL (ref 15–500)
Eosinophils Relative: 0.4 %
HCT: 37.4 % (ref 35.0–45.0)
Hemoglobin: 12.4 g/dL (ref 11.7–15.5)
Lymphs Abs: 1581 cells/uL (ref 850–3900)
MCH: 31.3 pg (ref 27.0–33.0)
MCHC: 33.2 g/dL (ref 32.0–36.0)
MCV: 94.4 fL (ref 80.0–100.0)
MPV: 10.5 fL (ref 7.5–12.5)
Monocytes Relative: 3.3 %
Neutro Abs: 8211 cells/uL — ABNORMAL HIGH (ref 1500–7800)
Neutrophils Relative %: 80.5 %
Platelets: 197 10*3/uL (ref 140–400)
RBC: 3.96 10*6/uL (ref 3.80–5.10)
RDW: 13.2 % (ref 11.0–15.0)
Total Lymphocyte: 15.5 %
WBC: 10.2 10*3/uL (ref 3.8–10.8)

## 2021-01-20 LAB — HEPATIC FUNCTION PANEL
AG Ratio: 0.8 (calc) — ABNORMAL LOW (ref 1.0–2.5)
ALT: 95 U/L — ABNORMAL HIGH (ref 6–29)
AST: 79 U/L — ABNORMAL HIGH (ref 10–35)
Albumin: 3.5 g/dL — ABNORMAL LOW (ref 3.6–5.1)
Alkaline phosphatase (APISO): 89 U/L (ref 37–153)
Bilirubin, Direct: 0.1 mg/dL (ref 0.0–0.2)
Globulin: 4.3 g/dL (calc) — ABNORMAL HIGH (ref 1.9–3.7)
Indirect Bilirubin: 0.2 mg/dL (calc) (ref 0.2–1.2)
Total Bilirubin: 0.3 mg/dL (ref 0.2–1.2)
Total Protein: 7.8 g/dL (ref 6.1–8.1)

## 2021-01-20 NOTE — Progress Notes (Addendum)
Presenting complaint;  Follow-up for autoimmune hepatitis.  Database hand Subjective:  Patient is 75 year old Caucasian female with multiple medical problems who was seen in her office by Dr. Jenetta Downer on 12/11/2020 for elevated transaminases.  She she had no symptoms.  Her transaminases are normal in 2019 and 2020. AST and ALT were 99 and 79 on 03/04/2020. AST and ALT were 124 and 93 on 05/14/2020. AST and ALT were 101 and 76 respectively on 08/05/2020. Abdominal ultrasound on 07/09/2020 revealed hepatomegaly and steatosis and spleen measuring 10 cm. Acute hepatitis panel was reportedly negative by PCP.  She has the following lab studies on 12/11/2020 WBC was 5.4 with H&H of 11.9 and 36.7 and platelet count was 205K. Bilirubin 0.3, AP 140, AST 88, ALT 53.  Total protein was elevated at 9.6 with albumin of 3.3.  Serum globulin was 6.3. Serum iron was 78, TIBC 319 and saturation 24% Hepatitis A antibody total was non-reactive. Hepatitis B surface antibody was nonreactive. ANA was positive; ANA titer was 1:1280 with speckled pattern Smooth muscle antibody was elevated at 27(ULN 20) Serum IgG was markedly elevated at 4473(ULN 1540 mg/dL).  A serology was concerning for autoimmune hepatitis and she therefore underwent liver biopsy on 12/31/2020 revealing severe ballooning with degeneration and multiple well-formed Mallory bodies.  Hepatic lobules showed multiple foci of macro inflammation.  Portal tracts revealed moderate inflammation with mild to moderate interface hepatitis.  Inflammatory infiltrate consisting of lymphocytes and scattered plasma cells.  Trichrome and reticulin stains reveal portal and centrilobular fibrosis with bridging fibrous septa.  Iron studies are unremarkable.  PASD stain showed few ceroid laden macrophages but no globular inclusions. 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 High INR on 12/31/2020 was 1.1.  TPMT activity was normal at 22.2.  She was begun on prednisone 60 mg daily starting on 01/05/2021 and azathioprine 50 mg daily on 01/19/2021.  Patient is accompanied by [facility] who is transporting the patient from rest to come to the office. Patient has no complaints other than her blood glucose is over 500 mg between 3 and 4 PM every day.  She remains with very good appetite.  She has not experienced nausea vomiting abdominal pain joint or muscle pain.  She is not having any issues with fluid retention.  She says she is not having any heartburn/chest pain since she has been on double dose PPI.  She has not gained any weight since her last visit.  Patient is blind but able to ambulate ambulate without any difficulty at nursing facility.   Current Medications: Outpatient Encounter Medications as of 01/20/2021  Medication Sig   acetaminophen (TYLENOL) 325 MG tablet Take 650 mg by mouth every 6 (six) hours as needed for moderate pain.   amitriptyline (ELAVIL) 50 MG tablet Take 50 mg by mouth at bedtime.   amLODipine (NORVASC) 10 MG tablet Take 10 mg by mouth at bedtime.    aspirin 81 MG tablet Take 81 mg by mouth at bedtime.   azaTHIOprine (IMURAN) 50 MG tablet Take 1 tablet (50 mg total) by mouth daily.   calcium carbonate (TUMS - DOSED IN MG ELEMENTAL CALCIUM) 500 MG chewable tablet Chew 1,000 mg by mouth every 8 (eight) hours as needed for heartburn.   Cholecalciferol 50 MCG (2000 UT) TABS Take 2,000 Units by mouth daily at 6 (six) AM.   gabapentin (NEURONTIN)  100 MG capsule Take 500 mg by mouth 3 (three) times daily.    glipiZIDE (GLUCOTROL) 5 MG tablet Take 5 mg by mouth 2 (two) times daily.   insulin lispro (HUMALOG) 100 UNIT/ML KwikPen Inject 2-15 Units into the skin 3 (three) times daily before meals. 121-150=2 units, 151-200= 3 units, 201-250 = 5 units, 251-300= 8 units, 301-400 = 15 units   LANTUS SOLOSTAR 100 UNIT/ML  Solostar Pen Inject 20 Units into the skin at bedtime.   levothyroxine (SYNTHROID) 150 MCG tablet Take 150 mcg by mouth in the morning.   losartan (COZAAR) 100 MG tablet Take 100 mg by mouth at bedtime.    magnesium oxide (MAG-OX) 400 MG tablet Take 400 mg by mouth daily.   Melatonin 5 MG CAPS Take 5 mg by mouth at bedtime.   metoprolol succinate (TOPROL XL) 25 MG 24 hr tablet Take 1 tablet (25 mg total) by mouth daily.   pantoprazole (PROTONIX) 40 MG tablet Take 40 mg by mouth 2 (two) times daily.   Phenylephrine-Mineral Oil-Pet (HEMORRHOIDAL) 0.25-14-71.9 % OINT Place 1 application rectally every 6 (six) hours as needed (hemorrhoid discomfort).   predniSONE (DELTASONE) 5 MG tablet Take 12 tablets (60 mg total) by mouth daily with breakfast for 21 days.   [DISCONTINUED] calcium-vitamin D (OSCAL WITH D) 500-200 MG-UNIT tablet Take 1 tablet by mouth.   No facility-administered encounter medications on file as of 01/20/2021.     Objective: Blood pressure (!) 126/55, pulse (!) 56, temperature 98.3 F (36.8 C), temperature source Oral, height 5' (1.524 m), weight 210 lb (95.3 kg). Patient is alert and in no acute distress. She is blind in both eyes.  Eyeballs are atrophic with haziness to cornea. Conjunctiva is pink. Sclera is nonicteric Oropharyngeal mucosa is normal. She is edentulous. No neck masses or thyromegaly noted. Cardiac exam with regular rhythm normal S1 and S2. No murmur or gallop noted. Lungs are clear to auscultation. Abdomen is full but soft and nontender with organomegaly or masses. No LE edema or clubbing noted.  Labs/studies Results:   CBC Latest Ref Rng & Units 01/13/2021 12/31/2020 12/11/2020  WBC 3.4 - 10.8 x10E3/uL 9.2 5.2 5.4  Hemoglobin 11.1 - 15.9 g/dL 13.0 11.4(L) 11.9  Hematocrit 34.0 - 46.6 % 39.0 35.1(L) 36.7  Platelets 150 - 450 x10E3/uL 267 213 205    CMP Latest Ref Rng & Units 01/13/2021 12/11/2020 06/19/2018  Glucose 65 - 99 mg/dL 286(H) 240(H) 96  BUN 8  - 27 mg/dL '26 22 13  ' Creatinine 0.57 - 1.00 mg/dL 1.04(H) 1.22(H) 0.88  Sodium 134 - 144 mmol/L 135 133(L) 133(L)  Potassium 3.5 - 5.2 mmol/L 4.4 4.1 2.8(L)  Chloride 96 - 106 mmol/L 96 99 101  CO2 20 - 29 mmol/L '24 27 24  ' Calcium 8.7 - 10.3 mg/dL 9.1 9.4 8.5(L)  Total Protein 6.0 - 8.5 g/dL 9.3(H) 9.6(H) -  Total Bilirubin 0.0 - 1.2 mg/dL 0.4 0.3 -  Alkaline Phos 44 - 121 IU/L 105 - -  AST 0 - 40 IU/L 231(H) 88(H) -  ALT 0 - 32 IU/L 151(H) 53(H) -    Hepatic Function Latest Ref Rng & Units 01/13/2021 12/11/2020 06/18/2018  Total Protein 6.0 - 8.5 g/dL 9.3(H) 9.6(H) 9.9(H)  Albumin 3.7 - 4.7 g/dL 3.8 - 3.7  AST 0 - 40 IU/L 231(H) 88(H) 32  ALT 0 - 32 IU/L 151(H) 53(H) 18  Alk Phosphatase 44 - 121 IU/L 105 - 56  Total Bilirubin 0.0 - 1.2 mg/dL 0.4  0.3 1.0  Bilirubin, Direct 0.1 - 0.5 mg/dL - - -    Liver biopsy results as above.   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  Clinically autoimmune hepatitis appears to be clinically more relevant than NASH but histology revealed severe steatohepatitis.  She has all the hallmarks of autoimmune hepatitis i.e. elevated smooth muscle antibody, ANA with speckled pattern and serum IgG of greater than 4400 mg. She has not experienced any symptoms of autoimmune hepatitis.  She has developed significant hyperglycemia with 60 mg of prednisone daily.  Therefore dose could be decreased.  Eventually she will need to be on higher dose of azathioprine.   Plan:  Decrease prednisone to 40 mg p.o. every morning through 01/26/2021. Starting on 01/28/2019 to decrease prednisone dose to 30 mg daily. Continue azathioprine at a dose of 50 mg p.o. daily for now.  If ANC is within normal limits will increase dose to 100 mg daily. Patient will go to lab for CBC with differential and LFTs today and in 2 weeks. Serum IgG will be repeated after her next office visit in 1 month. Office visit in 1 month.

## 2021-01-20 NOTE — Patient Instructions (Signed)
Decrease prednisone dose to 40 mg every morning starting on 01/21/2021 Decrease prednisone dose to 30 mg every morning starting on 01/27/2021. Physician will call with results of blood work when completed.

## 2021-01-21 ENCOUNTER — Other Ambulatory Visit (INDEPENDENT_AMBULATORY_CARE_PROVIDER_SITE_OTHER): Payer: Self-pay | Admitting: *Deleted

## 2021-01-21 DIAGNOSIS — R7989 Other specified abnormal findings of blood chemistry: Secondary | ICD-10-CM

## 2021-01-26 DIAGNOSIS — K754 Autoimmune hepatitis: Secondary | ICD-10-CM | POA: Diagnosis not present

## 2021-01-26 DIAGNOSIS — I10 Essential (primary) hypertension: Secondary | ICD-10-CM | POA: Diagnosis not present

## 2021-01-26 DIAGNOSIS — J449 Chronic obstructive pulmonary disease, unspecified: Secondary | ICD-10-CM | POA: Diagnosis not present

## 2021-01-26 DIAGNOSIS — E1100 Type 2 diabetes mellitus with hyperosmolarity without nonketotic hyperglycemic-hyperosmolar coma (NKHHC): Secondary | ICD-10-CM | POA: Diagnosis not present

## 2021-01-26 DIAGNOSIS — H547 Unspecified visual loss: Secondary | ICD-10-CM | POA: Diagnosis not present

## 2021-01-26 DIAGNOSIS — E1165 Type 2 diabetes mellitus with hyperglycemia: Secondary | ICD-10-CM | POA: Diagnosis not present

## 2021-01-26 DIAGNOSIS — E039 Hypothyroidism, unspecified: Secondary | ICD-10-CM | POA: Diagnosis not present

## 2021-01-30 DIAGNOSIS — I10 Essential (primary) hypertension: Secondary | ICD-10-CM | POA: Diagnosis not present

## 2021-02-04 DIAGNOSIS — Z79899 Other long term (current) drug therapy: Secondary | ICD-10-CM | POA: Diagnosis not present

## 2021-02-16 DIAGNOSIS — E1165 Type 2 diabetes mellitus with hyperglycemia: Secondary | ICD-10-CM | POA: Diagnosis not present

## 2021-02-16 DIAGNOSIS — E559 Vitamin D deficiency, unspecified: Secondary | ICD-10-CM | POA: Diagnosis not present

## 2021-02-16 DIAGNOSIS — E1100 Type 2 diabetes mellitus with hyperosmolarity without nonketotic hyperglycemic-hyperosmolar coma (NKHHC): Secondary | ICD-10-CM | POA: Diagnosis not present

## 2021-02-16 DIAGNOSIS — E785 Hyperlipidemia, unspecified: Secondary | ICD-10-CM | POA: Diagnosis not present

## 2021-02-16 DIAGNOSIS — J449 Chronic obstructive pulmonary disease, unspecified: Secondary | ICD-10-CM | POA: Diagnosis not present

## 2021-02-16 DIAGNOSIS — I2510 Atherosclerotic heart disease of native coronary artery without angina pectoris: Secondary | ICD-10-CM | POA: Diagnosis not present

## 2021-02-16 DIAGNOSIS — E039 Hypothyroidism, unspecified: Secondary | ICD-10-CM | POA: Diagnosis not present

## 2021-02-16 DIAGNOSIS — E119 Type 2 diabetes mellitus without complications: Secondary | ICD-10-CM | POA: Diagnosis not present

## 2021-02-16 DIAGNOSIS — D518 Other vitamin B12 deficiency anemias: Secondary | ICD-10-CM | POA: Diagnosis not present

## 2021-02-16 DIAGNOSIS — I10 Essential (primary) hypertension: Secondary | ICD-10-CM | POA: Diagnosis not present

## 2021-02-16 DIAGNOSIS — I5030 Unspecified diastolic (congestive) heart failure: Secondary | ICD-10-CM | POA: Diagnosis not present

## 2021-02-17 ENCOUNTER — Ambulatory Visit (INDEPENDENT_AMBULATORY_CARE_PROVIDER_SITE_OTHER): Payer: Medicare Other | Admitting: Internal Medicine

## 2021-02-19 ENCOUNTER — Ambulatory Visit (INDEPENDENT_AMBULATORY_CARE_PROVIDER_SITE_OTHER): Payer: Medicare Other | Admitting: Internal Medicine

## 2021-02-22 DIAGNOSIS — E1165 Type 2 diabetes mellitus with hyperglycemia: Secondary | ICD-10-CM | POA: Diagnosis not present

## 2021-02-24 DIAGNOSIS — I2510 Atherosclerotic heart disease of native coronary artery without angina pectoris: Secondary | ICD-10-CM | POA: Diagnosis not present

## 2021-02-24 DIAGNOSIS — E559 Vitamin D deficiency, unspecified: Secondary | ICD-10-CM | POA: Diagnosis not present

## 2021-02-24 DIAGNOSIS — E119 Type 2 diabetes mellitus without complications: Secondary | ICD-10-CM | POA: Diagnosis not present

## 2021-02-24 DIAGNOSIS — D649 Anemia, unspecified: Secondary | ICD-10-CM | POA: Diagnosis not present

## 2021-02-24 DIAGNOSIS — E039 Hypothyroidism, unspecified: Secondary | ICD-10-CM | POA: Diagnosis not present

## 2021-02-24 DIAGNOSIS — J449 Chronic obstructive pulmonary disease, unspecified: Secondary | ICD-10-CM | POA: Diagnosis not present

## 2021-02-24 DIAGNOSIS — G629 Polyneuropathy, unspecified: Secondary | ICD-10-CM | POA: Diagnosis not present

## 2021-02-24 DIAGNOSIS — K219 Gastro-esophageal reflux disease without esophagitis: Secondary | ICD-10-CM | POA: Diagnosis not present

## 2021-02-24 DIAGNOSIS — E785 Hyperlipidemia, unspecified: Secondary | ICD-10-CM | POA: Diagnosis not present

## 2021-02-24 DIAGNOSIS — I10 Essential (primary) hypertension: Secondary | ICD-10-CM | POA: Diagnosis not present

## 2021-02-24 DIAGNOSIS — K589 Irritable bowel syndrome without diarrhea: Secondary | ICD-10-CM | POA: Diagnosis not present

## 2021-03-02 DIAGNOSIS — R309 Painful micturition, unspecified: Secondary | ICD-10-CM | POA: Diagnosis not present

## 2021-03-04 ENCOUNTER — Encounter: Payer: Self-pay | Admitting: Cardiology

## 2021-03-04 ENCOUNTER — Ambulatory Visit (INDEPENDENT_AMBULATORY_CARE_PROVIDER_SITE_OTHER): Payer: Medicare Other | Admitting: Cardiology

## 2021-03-04 VITALS — BP 90/60 | HR 52 | Ht 60.0 in | Wt 207.8 lb

## 2021-03-04 DIAGNOSIS — I2510 Atherosclerotic heart disease of native coronary artery without angina pectoris: Secondary | ICD-10-CM

## 2021-03-04 DIAGNOSIS — I10 Essential (primary) hypertension: Secondary | ICD-10-CM | POA: Diagnosis not present

## 2021-03-04 DIAGNOSIS — E782 Mixed hyperlipidemia: Secondary | ICD-10-CM | POA: Diagnosis not present

## 2021-03-04 MED ORDER — AMLODIPINE BESYLATE 5 MG PO TABS: 5.0000 mg | ORAL_TABLET | Freq: Every day | ORAL | 3 refills | Status: DC

## 2021-03-04 NOTE — Patient Instructions (Addendum)
Medication Instructions:  Your physician has recommended you make the following change in your medication:  Decrease amlodipine to 5 mg daily Continue other medications the same  Labwork: Your physician recommends that you have FASTING lipid profile done. Please do not eat or drink for at least 8 hours when you have this done. You may take your medications that morning with a sip of water.  Testing/Procedures: none  Follow-Up: Your physician recommends that you schedule a follow-up appointment in: 6 months  Any Other Special Instructions Will Be Listed Below (If Applicable).  If you need a refill on your cardiac medications before your next appointment, please call your pharmacy.
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
## 2021-03-09 DIAGNOSIS — E1149 Type 2 diabetes mellitus with other diabetic neurological complication: Secondary | ICD-10-CM | POA: Diagnosis not present

## 2021-03-09 DIAGNOSIS — E1159 Type 2 diabetes mellitus with other circulatory complications: Secondary | ICD-10-CM | POA: Diagnosis not present

## 2021-03-10 DIAGNOSIS — E785 Hyperlipidemia, unspecified: Secondary | ICD-10-CM | POA: Diagnosis not present

## 2021-03-10 DIAGNOSIS — E782 Mixed hyperlipidemia: Secondary | ICD-10-CM | POA: Diagnosis not present

## 2021-03-10 DIAGNOSIS — Z79899 Other long term (current) drug therapy: Secondary | ICD-10-CM | POA: Diagnosis not present

## 2021-03-10 DIAGNOSIS — E559 Vitamin D deficiency, unspecified: Secondary | ICD-10-CM | POA: Diagnosis not present

## 2021-03-10 DIAGNOSIS — D518 Other vitamin B12 deficiency anemias: Secondary | ICD-10-CM | POA: Diagnosis not present

## 2021-03-10 DIAGNOSIS — E119 Type 2 diabetes mellitus without complications: Secondary | ICD-10-CM | POA: Diagnosis not present

## 2021-03-11 DIAGNOSIS — E1100 Type 2 diabetes mellitus with hyperosmolarity without nonketotic hyperglycemic-hyperosmolar coma (NKHHC): Secondary | ICD-10-CM | POA: Diagnosis not present

## 2021-03-11 DIAGNOSIS — E1165 Type 2 diabetes mellitus with hyperglycemia: Secondary | ICD-10-CM | POA: Diagnosis not present

## 2021-03-11 DIAGNOSIS — J449 Chronic obstructive pulmonary disease, unspecified: Secondary | ICD-10-CM | POA: Diagnosis not present

## 2021-03-11 DIAGNOSIS — E038 Other specified hypothyroidism: Secondary | ICD-10-CM | POA: Diagnosis not present

## 2021-03-11 DIAGNOSIS — E785 Hyperlipidemia, unspecified: Secondary | ICD-10-CM | POA: Diagnosis not present

## 2021-03-11 DIAGNOSIS — E119 Type 2 diabetes mellitus without complications: Secondary | ICD-10-CM | POA: Diagnosis not present

## 2021-03-11 DIAGNOSIS — I5030 Unspecified diastolic (congestive) heart failure: Secondary | ICD-10-CM | POA: Diagnosis not present

## 2021-03-11 DIAGNOSIS — I10 Essential (primary) hypertension: Secondary | ICD-10-CM | POA: Diagnosis not present

## 2021-03-11 DIAGNOSIS — E559 Vitamin D deficiency, unspecified: Secondary | ICD-10-CM | POA: Diagnosis not present

## 2021-03-11 DIAGNOSIS — D518 Other vitamin B12 deficiency anemias: Secondary | ICD-10-CM | POA: Diagnosis not present

## 2021-03-11 DIAGNOSIS — I2510 Atherosclerotic heart disease of native coronary artery without angina pectoris: Secondary | ICD-10-CM | POA: Diagnosis not present

## 2021-03-12 DIAGNOSIS — R300 Dysuria: Secondary | ICD-10-CM | POA: Diagnosis not present

## 2021-03-19 DIAGNOSIS — Z79899 Other long term (current) drug therapy: Secondary | ICD-10-CM | POA: Diagnosis not present

## 2021-03-19 DIAGNOSIS — E1165 Type 2 diabetes mellitus with hyperglycemia: Secondary | ICD-10-CM | POA: Diagnosis not present

## 2021-03-19 DIAGNOSIS — E1100 Type 2 diabetes mellitus with hyperosmolarity without nonketotic hyperglycemic-hyperosmolar coma (NKHHC): Secondary | ICD-10-CM | POA: Diagnosis not present

## 2021-03-19 DIAGNOSIS — E785 Hyperlipidemia, unspecified: Secondary | ICD-10-CM | POA: Diagnosis not present

## 2021-03-19 DIAGNOSIS — E039 Hypothyroidism, unspecified: Secondary | ICD-10-CM | POA: Diagnosis not present

## 2021-03-20 DIAGNOSIS — M25552 Pain in left hip: Secondary | ICD-10-CM | POA: Diagnosis not present

## 2021-03-20 DIAGNOSIS — M25572 Pain in left ankle and joints of left foot: Secondary | ICD-10-CM | POA: Diagnosis not present

## 2021-03-24 DIAGNOSIS — M79605 Pain in left leg: Secondary | ICD-10-CM | POA: Diagnosis not present

## 2021-03-24 DIAGNOSIS — I10 Essential (primary) hypertension: Secondary | ICD-10-CM | POA: Diagnosis not present

## 2021-03-24 DIAGNOSIS — B029 Zoster without complications: Secondary | ICD-10-CM | POA: Diagnosis not present

## 2021-03-24 DIAGNOSIS — E785 Hyperlipidemia, unspecified: Secondary | ICD-10-CM | POA: Diagnosis not present

## 2021-03-24 DIAGNOSIS — R739 Hyperglycemia, unspecified: Secondary | ICD-10-CM | POA: Diagnosis not present

## 2021-03-24 DIAGNOSIS — E039 Hypothyroidism, unspecified: Secondary | ICD-10-CM | POA: Diagnosis not present

## 2021-03-24 DIAGNOSIS — E1100 Type 2 diabetes mellitus with hyperosmolarity without nonketotic hyperglycemic-hyperosmolar coma (NKHHC): Secondary | ICD-10-CM | POA: Diagnosis not present

## 2021-03-24 DIAGNOSIS — E1165 Type 2 diabetes mellitus with hyperglycemia: Secondary | ICD-10-CM | POA: Diagnosis not present

## 2021-03-24 DIAGNOSIS — J449 Chronic obstructive pulmonary disease, unspecified: Secondary | ICD-10-CM | POA: Diagnosis not present

## 2021-03-24 DIAGNOSIS — K219 Gastro-esophageal reflux disease without esophagitis: Secondary | ICD-10-CM | POA: Diagnosis not present

## 2021-03-25 DIAGNOSIS — I10 Essential (primary) hypertension: Secondary | ICD-10-CM | POA: Diagnosis not present

## 2021-03-26 ENCOUNTER — Encounter (INDEPENDENT_AMBULATORY_CARE_PROVIDER_SITE_OTHER): Payer: Self-pay | Admitting: Gastroenterology

## 2021-03-26 ENCOUNTER — Ambulatory Visit (INDEPENDENT_AMBULATORY_CARE_PROVIDER_SITE_OTHER): Payer: Medicare Other | Admitting: Gastroenterology

## 2021-03-26 ENCOUNTER — Other Ambulatory Visit: Payer: Self-pay

## 2021-03-26 VITALS — BP 103/59 | HR 66 | Temp 97.3°F | Resp 16 | Wt 212.0 lb

## 2021-03-26 DIAGNOSIS — K754 Autoimmune hepatitis: Secondary | ICD-10-CM
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
## 2021-03-26 NOTE — Patient Instructions (Signed)
We will check some labs to evaluate your autoimmune hepatitis.   We will decrease your prednisone to 20mg  daily and increase Azathioprine to 75mg  daily, we will repeat labs in 4 weeks and may adjust medication again at that time. Please keep track of your blood sugar/blood pressure so that we can monitor how the steroids are affecting these.  Try to eat low fat/low cholesterol diet if possible.  Follow up 3 months.

## 2021-04-01 DIAGNOSIS — Z79899 Other long term (current) drug therapy: Secondary | ICD-10-CM | POA: Diagnosis not present

## 2021-04-06 DIAGNOSIS — M5136 Other intervertebral disc degeneration, lumbar region: Secondary | ICD-10-CM | POA: Diagnosis not present

## 2021-04-06 DIAGNOSIS — M25552 Pain in left hip: Secondary | ICD-10-CM | POA: Diagnosis not present

## 2021-04-09 DIAGNOSIS — I5030 Unspecified diastolic (congestive) heart failure: Secondary | ICD-10-CM | POA: Diagnosis not present

## 2021-04-09 DIAGNOSIS — D518 Other vitamin B12 deficiency anemias: Secondary | ICD-10-CM | POA: Diagnosis not present

## 2021-04-09 DIAGNOSIS — I2510 Atherosclerotic heart disease of native coronary artery without angina pectoris: Secondary | ICD-10-CM | POA: Diagnosis not present

## 2021-04-09 DIAGNOSIS — E785 Hyperlipidemia, unspecified: Secondary | ICD-10-CM | POA: Diagnosis not present

## 2021-04-09 DIAGNOSIS — I10 Essential (primary) hypertension: Secondary | ICD-10-CM | POA: Diagnosis not present

## 2021-04-09 DIAGNOSIS — E119 Type 2 diabetes mellitus without complications: Secondary | ICD-10-CM | POA: Diagnosis not present

## 2021-04-09 DIAGNOSIS — E1165 Type 2 diabetes mellitus with hyperglycemia: Secondary | ICD-10-CM | POA: Diagnosis not present

## 2021-04-09 DIAGNOSIS — E038 Other specified hypothyroidism: Secondary | ICD-10-CM | POA: Diagnosis not present

## 2021-04-09 DIAGNOSIS — J449 Chronic obstructive pulmonary disease, unspecified: Secondary | ICD-10-CM | POA: Diagnosis not present

## 2021-04-09 DIAGNOSIS — E1100 Type 2 diabetes mellitus with hyperosmolarity without nonketotic hyperglycemic-hyperosmolar coma (NKHHC): Secondary | ICD-10-CM | POA: Diagnosis not present

## 2021-04-09 DIAGNOSIS — E559 Vitamin D deficiency, unspecified: Secondary | ICD-10-CM | POA: Diagnosis not present

## 2021-04-10 ENCOUNTER — Encounter (INDEPENDENT_AMBULATORY_CARE_PROVIDER_SITE_OTHER): Payer: Self-pay

## 2021-04-14 DIAGNOSIS — M47816 Spondylosis without myelopathy or radiculopathy, lumbar region: Secondary | ICD-10-CM | POA: Diagnosis not present

## 2021-04-14 DIAGNOSIS — M79605 Pain in left leg: Secondary | ICD-10-CM | POA: Diagnosis not present

## 2021-04-14 DIAGNOSIS — Z7984 Long term (current) use of oral hypoglycemic drugs: Secondary | ICD-10-CM | POA: Diagnosis not present

## 2021-04-14 DIAGNOSIS — Z7982 Long term (current) use of aspirin: Secondary | ICD-10-CM | POA: Diagnosis not present

## 2021-04-14 DIAGNOSIS — E1140 Type 2 diabetes mellitus with diabetic neuropathy, unspecified: Secondary | ICD-10-CM | POA: Diagnosis not present

## 2021-04-14 DIAGNOSIS — M5116 Intervertebral disc disorders with radiculopathy, lumbar region: Secondary | ICD-10-CM | POA: Diagnosis not present

## 2021-04-22 DIAGNOSIS — Z7984 Long term (current) use of oral hypoglycemic drugs: Secondary | ICD-10-CM | POA: Diagnosis not present

## 2021-04-22 DIAGNOSIS — M79605 Pain in left leg: Secondary | ICD-10-CM | POA: Diagnosis not present

## 2021-04-22 DIAGNOSIS — M47816 Spondylosis without myelopathy or radiculopathy, lumbar region: Secondary | ICD-10-CM | POA: Diagnosis not present

## 2021-04-22 DIAGNOSIS — M5116 Intervertebral disc disorders with radiculopathy, lumbar region: Secondary | ICD-10-CM | POA: Diagnosis not present

## 2021-04-22 DIAGNOSIS — Z7982 Long term (current) use of aspirin: Secondary | ICD-10-CM | POA: Diagnosis not present

## 2021-04-22 DIAGNOSIS — E1140 Type 2 diabetes mellitus with diabetic neuropathy, unspecified: Secondary | ICD-10-CM | POA: Diagnosis not present

## 2021-04-27 ENCOUNTER — Other Ambulatory Visit (INDEPENDENT_AMBULATORY_CARE_PROVIDER_SITE_OTHER): Payer: Self-pay | Admitting: *Deleted

## 2021-04-27 DIAGNOSIS — K754 Autoimmune hepatitis: Secondary | ICD-10-CM

## 2021-04-28 DIAGNOSIS — Z7982 Long term (current) use of aspirin: Secondary | ICD-10-CM | POA: Diagnosis not present

## 2021-04-28 DIAGNOSIS — E1140 Type 2 diabetes mellitus with diabetic neuropathy, unspecified: Secondary | ICD-10-CM | POA: Diagnosis not present

## 2021-04-28 DIAGNOSIS — Z7984 Long term (current) use of oral hypoglycemic drugs: Secondary | ICD-10-CM | POA: Diagnosis not present

## 2021-04-28 DIAGNOSIS — I10 Essential (primary) hypertension: Secondary | ICD-10-CM | POA: Diagnosis not present

## 2021-04-28 DIAGNOSIS — M47816 Spondylosis without myelopathy or radiculopathy, lumbar region: Secondary | ICD-10-CM | POA: Diagnosis not present

## 2021-04-28 DIAGNOSIS — M5116 Intervertebral disc disorders with radiculopathy, lumbar region: Secondary | ICD-10-CM | POA: Diagnosis not present

## 2021-04-28 DIAGNOSIS — M79605 Pain in left leg: Secondary | ICD-10-CM | POA: Diagnosis not present

## 2021-05-01 DIAGNOSIS — Z7984 Long term (current) use of oral hypoglycemic drugs: Secondary | ICD-10-CM | POA: Diagnosis not present

## 2021-05-01 DIAGNOSIS — M79605 Pain in left leg: Secondary | ICD-10-CM | POA: Diagnosis not present

## 2021-05-01 DIAGNOSIS — E1140 Type 2 diabetes mellitus with diabetic neuropathy, unspecified: Secondary | ICD-10-CM | POA: Diagnosis not present

## 2021-05-01 DIAGNOSIS — M47816 Spondylosis without myelopathy or radiculopathy, lumbar region: Secondary | ICD-10-CM | POA: Diagnosis not present

## 2021-05-01 DIAGNOSIS — M5116 Intervertebral disc disorders with radiculopathy, lumbar region: Secondary | ICD-10-CM | POA: Diagnosis not present

## 2021-05-01 DIAGNOSIS — Z7982 Long term (current) use of aspirin: Secondary | ICD-10-CM | POA: Diagnosis not present

## 2021-05-04 DIAGNOSIS — E119 Type 2 diabetes mellitus without complications: Secondary | ICD-10-CM | POA: Diagnosis not present

## 2021-05-04 DIAGNOSIS — M5136 Other intervertebral disc degeneration, lumbar region: Secondary | ICD-10-CM | POA: Diagnosis not present

## 2021-05-04 DIAGNOSIS — E785 Hyperlipidemia, unspecified: Secondary | ICD-10-CM | POA: Diagnosis not present

## 2021-05-04 DIAGNOSIS — D518 Other vitamin B12 deficiency anemias: Secondary | ICD-10-CM | POA: Diagnosis not present

## 2021-05-04 DIAGNOSIS — E7849 Other hyperlipidemia: Secondary | ICD-10-CM | POA: Diagnosis not present

## 2021-05-04 DIAGNOSIS — Z79899 Other long term (current) drug therapy: Secondary | ICD-10-CM | POA: Diagnosis not present

## 2021-05-05 ENCOUNTER — Other Ambulatory Visit (HOSPITAL_COMMUNITY): Payer: Self-pay | Admitting: Sports Medicine

## 2021-05-05 ENCOUNTER — Other Ambulatory Visit: Payer: Self-pay | Admitting: Sports Medicine

## 2021-05-05 DIAGNOSIS — E785 Hyperlipidemia, unspecified: Secondary | ICD-10-CM | POA: Diagnosis not present

## 2021-05-05 DIAGNOSIS — Z7984 Long term (current) use of oral hypoglycemic drugs: Secondary | ICD-10-CM | POA: Diagnosis not present

## 2021-05-05 DIAGNOSIS — E1140 Type 2 diabetes mellitus with diabetic neuropathy, unspecified: Secondary | ICD-10-CM | POA: Diagnosis not present

## 2021-05-05 DIAGNOSIS — M5116 Intervertebral disc disorders with radiculopathy, lumbar region: Secondary | ICD-10-CM | POA: Diagnosis not present

## 2021-05-05 DIAGNOSIS — Z7982 Long term (current) use of aspirin: Secondary | ICD-10-CM | POA: Diagnosis not present

## 2021-05-05 DIAGNOSIS — G2581 Restless legs syndrome: Secondary | ICD-10-CM | POA: Diagnosis not present

## 2021-05-05 DIAGNOSIS — E039 Hypothyroidism, unspecified: Secondary | ICD-10-CM | POA: Diagnosis not present

## 2021-05-05 DIAGNOSIS — M5431 Sciatica, right side: Secondary | ICD-10-CM | POA: Diagnosis not present

## 2021-05-05 DIAGNOSIS — E1165 Type 2 diabetes mellitus with hyperglycemia: Secondary | ICD-10-CM | POA: Diagnosis not present

## 2021-05-05 DIAGNOSIS — I5030 Unspecified diastolic (congestive) heart failure: Secondary | ICD-10-CM | POA: Diagnosis not present

## 2021-05-05 DIAGNOSIS — M5416 Radiculopathy, lumbar region: Secondary | ICD-10-CM

## 2021-05-05 DIAGNOSIS — M47816 Spondylosis without myelopathy or radiculopathy, lumbar region: Secondary | ICD-10-CM | POA: Diagnosis not present

## 2021-05-05 DIAGNOSIS — M79605 Pain in left leg: Secondary | ICD-10-CM | POA: Diagnosis not present

## 2021-05-05 DIAGNOSIS — K589 Irritable bowel syndrome without diarrhea: Secondary | ICD-10-CM | POA: Diagnosis not present

## 2021-05-05 DIAGNOSIS — K219 Gastro-esophageal reflux disease without esophagitis: Secondary | ICD-10-CM | POA: Diagnosis not present

## 2021-05-07 DIAGNOSIS — E1140 Type 2 diabetes mellitus with diabetic neuropathy, unspecified: Secondary | ICD-10-CM | POA: Diagnosis not present

## 2021-05-07 DIAGNOSIS — M79605 Pain in left leg: Secondary | ICD-10-CM | POA: Diagnosis not present

## 2021-05-07 DIAGNOSIS — M5116 Intervertebral disc disorders with radiculopathy, lumbar region: Secondary | ICD-10-CM | POA: Diagnosis not present

## 2021-05-07 DIAGNOSIS — Z7984 Long term (current) use of oral hypoglycemic drugs: Secondary | ICD-10-CM | POA: Diagnosis not present

## 2021-05-07 DIAGNOSIS — Z7982 Long term (current) use of aspirin: Secondary | ICD-10-CM | POA: Diagnosis not present

## 2021-05-07 DIAGNOSIS — M47816 Spondylosis without myelopathy or radiculopathy, lumbar region: Secondary | ICD-10-CM | POA: Diagnosis not present

## 2021-05-08 DIAGNOSIS — E1100 Type 2 diabetes mellitus with hyperosmolarity without nonketotic hyperglycemic-hyperosmolar coma (NKHHC): Secondary | ICD-10-CM | POA: Diagnosis not present

## 2021-05-08 DIAGNOSIS — E1165 Type 2 diabetes mellitus with hyperglycemia: Secondary | ICD-10-CM | POA: Diagnosis not present

## 2021-05-08 DIAGNOSIS — D518 Other vitamin B12 deficiency anemias: Secondary | ICD-10-CM | POA: Diagnosis not present

## 2021-05-08 DIAGNOSIS — E559 Vitamin D deficiency, unspecified: Secondary | ICD-10-CM | POA: Diagnosis not present

## 2021-05-08 DIAGNOSIS — E119 Type 2 diabetes mellitus without complications: Secondary | ICD-10-CM | POA: Diagnosis not present

## 2021-05-08 DIAGNOSIS — E785 Hyperlipidemia, unspecified: Secondary | ICD-10-CM | POA: Diagnosis not present

## 2021-05-08 DIAGNOSIS — I2510 Atherosclerotic heart disease of native coronary artery without angina pectoris: Secondary | ICD-10-CM | POA: Diagnosis not present

## 2021-05-08 DIAGNOSIS — Z79899 Other long term (current) drug therapy: Secondary | ICD-10-CM | POA: Diagnosis not present

## 2021-05-08 DIAGNOSIS — E038 Other specified hypothyroidism: Secondary | ICD-10-CM | POA: Diagnosis not present

## 2021-05-08 DIAGNOSIS — I10 Essential (primary) hypertension: Secondary | ICD-10-CM | POA: Diagnosis not present

## 2021-05-08 DIAGNOSIS — I5030 Unspecified diastolic (congestive) heart failure: Secondary | ICD-10-CM | POA: Diagnosis not present

## 2021-05-08 DIAGNOSIS — J449 Chronic obstructive pulmonary disease, unspecified: Secondary | ICD-10-CM | POA: Diagnosis not present

## 2021-05-13 DIAGNOSIS — M5116 Intervertebral disc disorders with radiculopathy, lumbar region: Secondary | ICD-10-CM | POA: Diagnosis not present

## 2021-05-13 DIAGNOSIS — M47816 Spondylosis without myelopathy or radiculopathy, lumbar region: Secondary | ICD-10-CM | POA: Diagnosis not present

## 2021-05-13 DIAGNOSIS — M79605 Pain in left leg: Secondary | ICD-10-CM | POA: Diagnosis not present

## 2021-05-13 DIAGNOSIS — Z7984 Long term (current) use of oral hypoglycemic drugs: Secondary | ICD-10-CM | POA: Diagnosis not present

## 2021-05-13 DIAGNOSIS — E1140 Type 2 diabetes mellitus with diabetic neuropathy, unspecified: Secondary | ICD-10-CM | POA: Diagnosis not present

## 2021-05-13 DIAGNOSIS — Z7982 Long term (current) use of aspirin: Secondary | ICD-10-CM | POA: Diagnosis not present

## 2021-05-15 ENCOUNTER — Ambulatory Visit (HOSPITAL_COMMUNITY)
Admission: RE | Admit: 2021-05-15 | Discharge: 2021-05-15 | Disposition: A | Discharge status: Home / Self Care | Payer: Medicare Other | Source: Ambulatory Visit | Attending: Sports Medicine | Admitting: Sports Medicine

## 2021-05-15 ENCOUNTER — Other Ambulatory Visit: Payer: Self-pay

## 2021-05-15 DIAGNOSIS — M5416 Radiculopathy, lumbar region: Secondary | ICD-10-CM

## 2021-05-15 DIAGNOSIS — E1140 Type 2 diabetes mellitus with diabetic neuropathy, unspecified: Secondary | ICD-10-CM | POA: Diagnosis not present

## 2021-05-15 DIAGNOSIS — M5116 Intervertebral disc disorders with radiculopathy, lumbar region: Secondary | ICD-10-CM | POA: Diagnosis not present

## 2021-05-15 DIAGNOSIS — M545 Low back pain, unspecified: Secondary | ICD-10-CM | POA: Diagnosis not present

## 2021-05-15 DIAGNOSIS — Z7982 Long term (current) use of aspirin: Secondary | ICD-10-CM | POA: Diagnosis not present

## 2021-05-15 DIAGNOSIS — M79605 Pain in left leg: Secondary | ICD-10-CM | POA: Diagnosis not present

## 2021-05-15 DIAGNOSIS — Z7984 Long term (current) use of oral hypoglycemic drugs: Secondary | ICD-10-CM | POA: Diagnosis not present

## 2021-05-15 DIAGNOSIS — M47816 Spondylosis without myelopathy or radiculopathy, lumbar region: Secondary | ICD-10-CM | POA: Diagnosis not present

## 2021-05-18 DIAGNOSIS — E1140 Type 2 diabetes mellitus with diabetic neuropathy, unspecified: Secondary | ICD-10-CM | POA: Diagnosis not present

## 2021-05-18 DIAGNOSIS — M47816 Spondylosis without myelopathy or radiculopathy, lumbar region: Secondary | ICD-10-CM | POA: Diagnosis not present

## 2021-05-18 DIAGNOSIS — Z7982 Long term (current) use of aspirin: Secondary | ICD-10-CM | POA: Diagnosis not present

## 2021-05-18 DIAGNOSIS — Z7984 Long term (current) use of oral hypoglycemic drugs: Secondary | ICD-10-CM | POA: Diagnosis not present

## 2021-05-18 DIAGNOSIS — M5116 Intervertebral disc disorders with radiculopathy, lumbar region: Secondary | ICD-10-CM | POA: Diagnosis not present

## 2021-05-18 DIAGNOSIS — M79605 Pain in left leg: Secondary | ICD-10-CM | POA: Diagnosis not present

## 2021-05-22 DIAGNOSIS — M79605 Pain in left leg: Secondary | ICD-10-CM | POA: Diagnosis not present

## 2021-05-22 DIAGNOSIS — Z7982 Long term (current) use of aspirin: Secondary | ICD-10-CM | POA: Diagnosis not present

## 2021-05-22 DIAGNOSIS — Z7984 Long term (current) use of oral hypoglycemic drugs: Secondary | ICD-10-CM | POA: Diagnosis not present

## 2021-05-22 DIAGNOSIS — M47816 Spondylosis without myelopathy or radiculopathy, lumbar region: Secondary | ICD-10-CM | POA: Diagnosis not present

## 2021-05-22 DIAGNOSIS — E1140 Type 2 diabetes mellitus with diabetic neuropathy, unspecified: Secondary | ICD-10-CM | POA: Diagnosis not present

## 2021-05-22 DIAGNOSIS — M5116 Intervertebral disc disorders with radiculopathy, lumbar region: Secondary | ICD-10-CM | POA: Diagnosis not present

## 2021-05-26 DIAGNOSIS — I10 Essential (primary) hypertension: Secondary | ICD-10-CM | POA: Diagnosis not present

## 2021-05-27 DIAGNOSIS — M47816 Spondylosis without myelopathy or radiculopathy, lumbar region: Secondary | ICD-10-CM | POA: Diagnosis not present

## 2021-05-27 DIAGNOSIS — M79605 Pain in left leg: Secondary | ICD-10-CM | POA: Diagnosis not present

## 2021-05-27 DIAGNOSIS — Z7984 Long term (current) use of oral hypoglycemic drugs: Secondary | ICD-10-CM | POA: Diagnosis not present

## 2021-05-27 DIAGNOSIS — M5116 Intervertebral disc disorders with radiculopathy, lumbar region: Secondary | ICD-10-CM | POA: Diagnosis not present

## 2021-05-27 DIAGNOSIS — Z7982 Long term (current) use of aspirin: Secondary | ICD-10-CM | POA: Diagnosis not present

## 2021-05-27 DIAGNOSIS — E1140 Type 2 diabetes mellitus with diabetic neuropathy, unspecified: Secondary | ICD-10-CM | POA: Diagnosis not present

## 2021-06-03 DIAGNOSIS — E038 Other specified hypothyroidism: Secondary | ICD-10-CM | POA: Diagnosis not present

## 2021-06-03 DIAGNOSIS — E1165 Type 2 diabetes mellitus with hyperglycemia: Secondary | ICD-10-CM | POA: Diagnosis not present

## 2021-06-03 DIAGNOSIS — I2510 Atherosclerotic heart disease of native coronary artery without angina pectoris: Secondary | ICD-10-CM | POA: Diagnosis not present

## 2021-06-03 DIAGNOSIS — I5030 Unspecified diastolic (congestive) heart failure: Secondary | ICD-10-CM | POA: Diagnosis not present

## 2021-06-03 DIAGNOSIS — D518 Other vitamin B12 deficiency anemias: Secondary | ICD-10-CM | POA: Diagnosis not present

## 2021-06-03 DIAGNOSIS — E1100 Type 2 diabetes mellitus with hyperosmolarity without nonketotic hyperglycemic-hyperosmolar coma (NKHHC): Secondary | ICD-10-CM | POA: Diagnosis not present

## 2021-06-03 DIAGNOSIS — J449 Chronic obstructive pulmonary disease, unspecified: Secondary | ICD-10-CM | POA: Diagnosis not present

## 2021-06-03 DIAGNOSIS — I10 Essential (primary) hypertension: Secondary | ICD-10-CM | POA: Diagnosis not present

## 2021-06-03 DIAGNOSIS — E559 Vitamin D deficiency, unspecified: Secondary | ICD-10-CM | POA: Diagnosis not present

## 2021-06-03 DIAGNOSIS — E119 Type 2 diabetes mellitus without complications: Secondary | ICD-10-CM | POA: Diagnosis not present

## 2021-06-03 DIAGNOSIS — E785 Hyperlipidemia, unspecified: Secondary | ICD-10-CM | POA: Diagnosis not present

## 2021-06-04 DIAGNOSIS — E1100 Type 2 diabetes mellitus with hyperosmolarity without nonketotic hyperglycemic-hyperosmolar coma (NKHHC): Secondary | ICD-10-CM | POA: Diagnosis not present

## 2021-06-04 DIAGNOSIS — M5431 Sciatica, right side: Secondary | ICD-10-CM | POA: Diagnosis not present

## 2021-06-04 DIAGNOSIS — Z794 Long term (current) use of insulin: Secondary | ICD-10-CM | POA: Diagnosis not present

## 2021-06-04 DIAGNOSIS — R5381 Other malaise: Secondary | ICD-10-CM | POA: Diagnosis not present

## 2021-06-04 DIAGNOSIS — E039 Hypothyroidism, unspecified: Secondary | ICD-10-CM | POA: Diagnosis not present

## 2021-06-04 DIAGNOSIS — R7401 Elevation of levels of liver transaminase levels: Secondary | ICD-10-CM | POA: Diagnosis not present

## 2021-06-04 DIAGNOSIS — E559 Vitamin D deficiency, unspecified: Secondary | ICD-10-CM | POA: Diagnosis not present

## 2021-06-04 DIAGNOSIS — E785 Hyperlipidemia, unspecified: Secondary | ICD-10-CM | POA: Diagnosis not present

## 2021-06-14 DIAGNOSIS — E1165 Type 2 diabetes mellitus with hyperglycemia: Secondary | ICD-10-CM | POA: Diagnosis not present

## 2021-06-15 DIAGNOSIS — M5136 Other intervertebral disc degeneration, lumbar region: Secondary | ICD-10-CM | POA: Diagnosis not present

## 2021-06-22 DIAGNOSIS — E119 Type 2 diabetes mellitus without complications: Secondary | ICD-10-CM | POA: Diagnosis not present

## 2021-06-22 DIAGNOSIS — D518 Other vitamin B12 deficiency anemias: Secondary | ICD-10-CM | POA: Diagnosis not present

## 2021-06-22 DIAGNOSIS — Z79899 Other long term (current) drug therapy: Secondary | ICD-10-CM | POA: Diagnosis not present

## 2021-06-22 DIAGNOSIS — E7849 Other hyperlipidemia: Secondary | ICD-10-CM | POA: Diagnosis not present

## 2021-06-22 DIAGNOSIS — E559 Vitamin D deficiency, unspecified: Secondary | ICD-10-CM | POA: Diagnosis not present

## 2021-06-26 DIAGNOSIS — I10 Essential (primary) hypertension: Secondary | ICD-10-CM | POA: Diagnosis not present

## 2021-06-28 ENCOUNTER — Encounter (HOSPITAL_COMMUNITY): Payer: Self-pay | Admitting: Emergency Medicine

## 2021-06-28 ENCOUNTER — Inpatient Hospital Stay (HOSPITAL_COMMUNITY)
Admission: EM | Admit: 2021-06-28 | Discharge: 2021-07-03 | DRG: 562 | Disposition: A | Discharge status: Skilled Nursing Facility | Payer: Medicare Other | Source: Skilled Nursing Facility | Attending: Internal Medicine | Admitting: Internal Medicine

## 2021-06-28 ENCOUNTER — Other Ambulatory Visit: Payer: Self-pay

## 2021-06-28 DIAGNOSIS — I700 Atherosclerosis of aorta: Secondary | ICD-10-CM | POA: Diagnosis present

## 2021-06-28 DIAGNOSIS — K219 Gastro-esophageal reflux disease without esophagitis: Secondary | ICD-10-CM | POA: Diagnosis present

## 2021-06-28 DIAGNOSIS — E0842 Diabetes mellitus due to underlying condition with diabetic polyneuropathy: Secondary | ICD-10-CM | POA: Diagnosis not present

## 2021-06-28 DIAGNOSIS — Z794 Long term (current) use of insulin: Secondary | ICD-10-CM

## 2021-06-28 DIAGNOSIS — K589 Irritable bowel syndrome without diarrhea: Secondary | ICD-10-CM | POA: Diagnosis present

## 2021-06-28 DIAGNOSIS — E039 Hypothyroidism, unspecified: Secondary | ICD-10-CM | POA: Diagnosis not present

## 2021-06-28 DIAGNOSIS — M25571 Pain in right ankle and joints of right foot: Secondary | ICD-10-CM | POA: Diagnosis not present

## 2021-06-28 DIAGNOSIS — S0990XA Unspecified injury of head, initial encounter: Secondary | ICD-10-CM | POA: Diagnosis not present

## 2021-06-28 DIAGNOSIS — Z7982 Long term (current) use of aspirin: Secondary | ICD-10-CM

## 2021-06-28 DIAGNOSIS — S82841A Displaced bimalleolar fracture of right lower leg, initial encounter for closed fracture: Secondary | ICD-10-CM | POA: Diagnosis not present

## 2021-06-28 DIAGNOSIS — E669 Obesity, unspecified: Secondary | ICD-10-CM | POA: Diagnosis present

## 2021-06-28 DIAGNOSIS — Z79899 Other long term (current) drug therapy: Secondary | ICD-10-CM

## 2021-06-28 DIAGNOSIS — G4700 Insomnia, unspecified: Secondary | ICD-10-CM

## 2021-06-28 DIAGNOSIS — D518 Other vitamin B12 deficiency anemias: Secondary | ICD-10-CM | POA: Diagnosis not present

## 2021-06-28 DIAGNOSIS — Z79890 Hormone replacement therapy (postmenopausal): Secondary | ICD-10-CM | POA: Diagnosis not present

## 2021-06-28 DIAGNOSIS — I2510 Atherosclerotic heart disease of native coronary artery without angina pectoris: Secondary | ICD-10-CM | POA: Diagnosis not present

## 2021-06-28 DIAGNOSIS — E038 Other specified hypothyroidism: Secondary | ICD-10-CM | POA: Diagnosis not present

## 2021-06-28 DIAGNOSIS — R296 Repeated falls: Secondary | ICD-10-CM | POA: Diagnosis not present

## 2021-06-28 DIAGNOSIS — S82831A Other fracture of upper and lower end of right fibula, initial encounter for closed fracture: Secondary | ICD-10-CM | POA: Diagnosis not present

## 2021-06-28 DIAGNOSIS — W19XXXA Unspecified fall, initial encounter: Secondary | ICD-10-CM

## 2021-06-28 DIAGNOSIS — E1140 Type 2 diabetes mellitus with diabetic neuropathy, unspecified: Secondary | ICD-10-CM

## 2021-06-28 DIAGNOSIS — Z7952 Long term (current) use of systemic steroids: Secondary | ICD-10-CM | POA: Diagnosis not present

## 2021-06-28 DIAGNOSIS — E876 Hypokalemia: Secondary | ICD-10-CM | POA: Diagnosis not present

## 2021-06-28 DIAGNOSIS — R519 Headache, unspecified: Secondary | ICD-10-CM | POA: Diagnosis not present

## 2021-06-28 DIAGNOSIS — Z7984 Long term (current) use of oral hypoglycemic drugs: Secondary | ICD-10-CM

## 2021-06-28 DIAGNOSIS — Z885 Allergy status to narcotic agent status: Secondary | ICD-10-CM

## 2021-06-28 DIAGNOSIS — E785 Hyperlipidemia, unspecified: Secondary | ICD-10-CM | POA: Diagnosis present

## 2021-06-28 DIAGNOSIS — H548 Legal blindness, as defined in USA: Secondary | ICD-10-CM | POA: Diagnosis not present

## 2021-06-28 DIAGNOSIS — K754 Autoimmune hepatitis: Secondary | ICD-10-CM | POA: Diagnosis present

## 2021-06-28 DIAGNOSIS — Z743 Need for continuous supervision: Secondary | ICD-10-CM | POA: Diagnosis not present

## 2021-06-28 DIAGNOSIS — E782 Mixed hyperlipidemia: Secondary | ICD-10-CM | POA: Diagnosis present

## 2021-06-28 DIAGNOSIS — Q738 Other reduction defects of unspecified limb(s): Secondary | ICD-10-CM

## 2021-06-28 DIAGNOSIS — E1142 Type 2 diabetes mellitus with diabetic polyneuropathy: Secondary | ICD-10-CM | POA: Diagnosis not present

## 2021-06-28 DIAGNOSIS — Z90710 Acquired absence of both cervix and uterus: Secondary | ICD-10-CM

## 2021-06-28 DIAGNOSIS — U071 COVID-19: Secondary | ICD-10-CM

## 2021-06-28 DIAGNOSIS — X501XXA Overexertion from prolonged static or awkward postures, initial encounter: Secondary | ICD-10-CM | POA: Diagnosis not present

## 2021-06-28 DIAGNOSIS — E1165 Type 2 diabetes mellitus with hyperglycemia: Secondary | ICD-10-CM | POA: Diagnosis not present

## 2021-06-28 DIAGNOSIS — F419 Anxiety disorder, unspecified: Secondary | ICD-10-CM | POA: Diagnosis present

## 2021-06-28 DIAGNOSIS — Z9049 Acquired absence of other specified parts of digestive tract: Secondary | ICD-10-CM

## 2021-06-28 DIAGNOSIS — S99911A Unspecified injury of right ankle, initial encounter: Secondary | ICD-10-CM | POA: Diagnosis not present

## 2021-06-28 DIAGNOSIS — E119 Type 2 diabetes mellitus without complications: Secondary | ICD-10-CM

## 2021-06-28 DIAGNOSIS — I10 Essential (primary) hypertension: Secondary | ICD-10-CM | POA: Diagnosis present

## 2021-06-28 DIAGNOSIS — Z8249 Family history of ischemic heart disease and other diseases of the circulatory system: Secondary | ICD-10-CM

## 2021-06-28 DIAGNOSIS — E1100 Type 2 diabetes mellitus with hyperosmolarity without nonketotic hyperglycemic-hyperosmolar coma (NKHHC): Secondary | ICD-10-CM | POA: Diagnosis not present

## 2021-06-28 DIAGNOSIS — I5030 Unspecified diastolic (congestive) heart failure: Secondary | ICD-10-CM | POA: Diagnosis not present

## 2021-06-28 DIAGNOSIS — R279 Unspecified lack of coordination: Secondary | ICD-10-CM | POA: Diagnosis not present

## 2021-06-28 DIAGNOSIS — E66811 Obesity, class 1: Secondary | ICD-10-CM

## 2021-06-28 DIAGNOSIS — Z9012 Acquired absence of left breast and nipple: Secondary | ICD-10-CM

## 2021-06-28 DIAGNOSIS — E559 Vitamin D deficiency, unspecified: Secondary | ICD-10-CM | POA: Diagnosis not present

## 2021-06-28 DIAGNOSIS — J449 Chronic obstructive pulmonary disease, unspecified: Secondary | ICD-10-CM | POA: Diagnosis present

## 2021-06-28 DIAGNOSIS — Z87891 Personal history of nicotine dependence: Secondary | ICD-10-CM

## 2021-06-28 DIAGNOSIS — M25572 Pain in left ankle and joints of left foot: Secondary | ICD-10-CM | POA: Diagnosis not present

## 2021-06-28 DIAGNOSIS — R531 Weakness: Secondary | ICD-10-CM | POA: Diagnosis not present

## 2021-06-28 DIAGNOSIS — S82899A Other fracture of unspecified lower leg, initial encounter for closed fracture: Secondary | ICD-10-CM | POA: Diagnosis present

## 2021-06-28 DIAGNOSIS — Z043 Encounter for examination and observation following other accident: Secondary | ICD-10-CM | POA: Diagnosis not present

## 2021-06-28 DIAGNOSIS — Z6838 Body mass index (BMI) 38.0-38.9, adult: Secondary | ICD-10-CM

## 2021-06-28 NOTE — ED Triage Notes (Addendum)
Pt fell going to restroom, c/o right ankle pain. EMS states there was obvious deformity. Pt states she hit her head on door as well.

## 2021-06-29 ENCOUNTER — Inpatient Hospital Stay (HOSPITAL_COMMUNITY): Payer: Medicare Other

## 2021-06-29 ENCOUNTER — Emergency Department (HOSPITAL_COMMUNITY): Payer: Medicare Other

## 2021-06-29 DIAGNOSIS — K219 Gastro-esophageal reflux disease without esophagitis: Secondary | ICD-10-CM

## 2021-06-29 DIAGNOSIS — S82899A Other fracture of unspecified lower leg, initial encounter for closed fracture: Secondary | ICD-10-CM | POA: Diagnosis present

## 2021-06-29 DIAGNOSIS — E785 Hyperlipidemia, unspecified: Secondary | ICD-10-CM

## 2021-06-29 DIAGNOSIS — S82841A Displaced bimalleolar fracture of right lower leg, initial encounter for closed fracture: Secondary | ICD-10-CM | POA: Diagnosis not present

## 2021-06-29 DIAGNOSIS — E1140 Type 2 diabetes mellitus with diabetic neuropathy, unspecified: Secondary | ICD-10-CM

## 2021-06-29 DIAGNOSIS — U071 COVID-19: Secondary | ICD-10-CM

## 2021-06-29 DIAGNOSIS — E0842 Diabetes mellitus due to underlying condition with diabetic polyneuropathy: Secondary | ICD-10-CM

## 2021-06-29 DIAGNOSIS — E876 Hypokalemia: Secondary | ICD-10-CM

## 2021-06-29 DIAGNOSIS — G4700 Insomnia, unspecified: Secondary | ICD-10-CM

## 2021-06-29 DIAGNOSIS — I10 Essential (primary) hypertension: Secondary | ICD-10-CM | POA: Diagnosis not present

## 2021-06-29 DIAGNOSIS — E039 Hypothyroidism, unspecified: Secondary | ICD-10-CM

## 2021-06-29 DIAGNOSIS — E1165 Type 2 diabetes mellitus with hyperglycemia: Secondary | ICD-10-CM

## 2021-06-29 HISTORY — DX: Displaced bimalleolar fracture of right lower leg, initial encounter for closed fracture: S82.841A

## 2021-06-29 LAB — CBC WITH DIFFERENTIAL/PLATELET
Abs Immature Granulocytes: 0.07 10*3/uL (ref 0.00–0.07)
Basophils Absolute: 0 10*3/uL (ref 0.0–0.1)
Basophils Relative: 0 %
Eosinophils Absolute: 0.1 10*3/uL (ref 0.0–0.5)
Eosinophils Relative: 1 %
HCT: 37.3 % (ref 36.0–46.0)
Hemoglobin: 12.2 g/dL (ref 12.0–15.0)
Immature Granulocytes: 1 %
Lymphocytes Relative: 29 %
Lymphs Abs: 1.7 10*3/uL (ref 0.7–4.0)
MCH: 32.2 pg (ref 26.0–34.0)
MCHC: 32.7 g/dL (ref 30.0–36.0)
MCV: 98.4 fL (ref 80.0–100.0)
Monocytes Absolute: 0.4 10*3/uL (ref 0.1–1.0)
Monocytes Relative: 6 %
Neutro Abs: 3.7 10*3/uL (ref 1.7–7.7)
Neutrophils Relative %: 63 %
Platelets: 193 10*3/uL (ref 150–400)
RBC: 3.79 MIL/uL — ABNORMAL LOW (ref 3.87–5.11)
RDW: 14.1 % (ref 11.5–15.5)
WBC: 5.8 10*3/uL (ref 4.0–10.5)
nRBC: 0 % (ref 0.0–0.2)

## 2021-06-29 LAB — SURGICAL PCR SCREEN
MRSA, PCR: NEGATIVE
Staphylococcus aureus: POSITIVE — AB

## 2021-06-29 LAB — HEMOGLOBIN A1C
Hgb A1c MFr Bld: 7.7 % — ABNORMAL HIGH (ref 4.8–5.6)
Mean Plasma Glucose: 174.29 mg/dL

## 2021-06-29 LAB — GLUCOSE, CAPILLARY
Glucose-Capillary: 90 mg/dL (ref 70–99)
Glucose-Capillary: 61 mg/dL — ABNORMAL LOW (ref 70–99)
Glucose-Capillary: 103 mg/dL — ABNORMAL HIGH (ref 70–99)
Glucose-Capillary: 105 mg/dL — ABNORMAL HIGH (ref 70–99)
Glucose-Capillary: 135 mg/dL — ABNORMAL HIGH (ref 70–99)
Glucose-Capillary: 123 mg/dL — ABNORMAL HIGH (ref 70–99)

## 2021-06-29 LAB — BASIC METABOLIC PANEL
Anion gap: 6 (ref 5–15)
BUN: 12 mg/dL (ref 8–23)
CO2: 27 mmol/L (ref 22–32)
Calcium: 8.5 mg/dL — ABNORMAL LOW (ref 8.9–10.3)
Chloride: 104 mmol/L (ref 98–111)
Creatinine, Ser: 0.75 mg/dL (ref 0.44–1.00)
GFR, Estimated: >60 mL/min (ref >60)
Glucose, Bld: 164 mg/dL — ABNORMAL HIGH (ref 70–99)
Potassium: 3 mmol/L — ABNORMAL LOW (ref 3.5–5.1)
Sodium: 137 mmol/L (ref 135–145)

## 2021-06-29 LAB — RESP PANEL BY RT-PCR (FLU A&B, COVID) ARPGX2
Influenza A by PCR: NEGATIVE
Influenza B by PCR: NEGATIVE
SARS Coronavirus 2 by RT PCR: POSITIVE — AB

## 2021-06-29 MED ORDER — LIDOCAINE 5 % EX PTCH
1.0000 | MEDICATED_PATCH | Freq: Every day | CUTANEOUS | Status: DC
  Administered 2021-06-29 – 2021-07-02 (×4): 1 via TRANSDERMAL
  Filled 2021-06-29 (×4): qty 1

## 2021-06-29 MED ORDER — PREDNISONE 20 MG PO TABS
20.0000 mg | ORAL_TABLET | Freq: Every day | ORAL | Status: DC
  Administered 2021-06-30 – 2021-07-02 (×3): 20 mg via ORAL
  Filled 2021-06-29 (×3): qty 1

## 2021-06-29 MED ORDER — INSULIN ASPART 100 UNIT/ML IJ SOLN: 0.0000 [IU] | Freq: Every day | INTRAMUSCULAR | Status: DC

## 2021-06-29 MED ORDER — MAGNESIUM OXIDE -MG SUPPLEMENT 400 (240 MG) MG PO TABS
400.0000 mg | ORAL_TABLET | Freq: Every day | ORAL | Status: DC
  Administered 2021-06-29 – 2021-07-02 (×4): 400 mg via ORAL
  Filled 2021-06-29 (×4): qty 1

## 2021-06-29 MED ORDER — POTASSIUM CHLORIDE CRYS ER 20 MEQ PO TBCR
40.0000 meq | EXTENDED_RELEASE_TABLET | Freq: Once | ORAL | Status: AC
  Administered 2021-06-29: 40 meq via ORAL
  Filled 2021-06-29: qty 2

## 2021-06-29 MED ORDER — INSULIN GLARGINE-YFGN 100 UNIT/ML ~~LOC~~ SOLN
5.0000 [IU] | Freq: Two times a day (BID) | SUBCUTANEOUS | Status: DC
  Filled 2021-06-29: qty 0.05

## 2021-06-29 MED ORDER — MELATONIN 3 MG PO TABS
6.0000 mg | ORAL_TABLET | Freq: Every day | ORAL | Status: DC
  Administered 2021-06-29 – 2021-07-02 (×4): 6 mg via ORAL
  Filled 2021-06-29 (×4): qty 2

## 2021-06-29 MED ORDER — AMLODIPINE BESYLATE 5 MG PO TABS
5.0000 mg | ORAL_TABLET | Freq: Every day | ORAL | Status: DC
  Administered 2021-06-29: 5 mg via ORAL
  Filled 2021-06-29: qty 1

## 2021-06-29 MED ORDER — INSULIN ASPART 100 UNIT/ML IJ SOLN
0.0000 [IU] | Freq: Three times a day (TID) | INTRAMUSCULAR | Status: DC
  Administered 2021-06-29: 2 [IU] via SUBCUTANEOUS
  Administered 2021-06-30: 8 [IU] via SUBCUTANEOUS
  Administered 2021-06-30 – 2021-07-01 (×2): 3 [IU] via SUBCUTANEOUS
  Administered 2021-07-01: 5 [IU] via SUBCUTANEOUS
  Administered 2021-07-02: 2 [IU] via SUBCUTANEOUS
  Administered 2021-07-02: 11 [IU] via SUBCUTANEOUS
  Administered 2021-07-02: 5 [IU] via SUBCUTANEOUS

## 2021-06-29 MED ORDER — ASCORBIC ACID 500 MG PO TABS
500.0000 mg | ORAL_TABLET | Freq: Every day | ORAL | Status: DC
  Administered 2021-06-29 – 2021-07-02 (×4): 500 mg via ORAL
  Filled 2021-06-29 (×4): qty 1

## 2021-06-29 MED ORDER — ACETAMINOPHEN 325 MG PO TABS
650.0000 mg | ORAL_TABLET | Freq: Four times a day (QID) | ORAL | Status: DC | PRN
  Administered 2021-06-30: 650 mg via ORAL
  Filled 2021-06-29: qty 2

## 2021-06-29 MED ORDER — INSULIN GLARGINE-YFGN 100 UNIT/ML ~~LOC~~ SOLN
6.0000 [IU] | Freq: Every day | SUBCUTANEOUS | Status: DC
  Administered 2021-06-29 – 2021-07-02 (×4): 6 [IU] via SUBCUTANEOUS
  Filled 2021-06-29 (×4): qty 0.06

## 2021-06-29 MED ORDER — SERTRALINE HCL 50 MG PO TABS
25.0000 mg | ORAL_TABLET | Freq: Every day | ORAL | Status: DC
  Administered 2021-06-29 – 2021-07-02 (×4): 25 mg via ORAL
  Filled 2021-06-29 (×4): qty 1

## 2021-06-29 MED ORDER — METHOCARBAMOL 1000 MG/10ML IJ SOLN
500.0000 mg | Freq: Four times a day (QID) | INTRAVENOUS | Status: DC | PRN
  Filled 2021-06-29: qty 5

## 2021-06-29 MED ORDER — DEXTROSE 50 % IV SOLN
12.5000 g | INTRAVENOUS | Status: AC
  Administered 2021-06-29: 12.5 g via INTRAVENOUS

## 2021-06-29 MED ORDER — POLYETHYLENE GLYCOL 3350 17 G PO PACK: 17.0000 g | PACK | Freq: Every day | ORAL | Status: DC | PRN

## 2021-06-29 MED ORDER — PHENYLEPHRINE-MINERAL OIL-PET 0.25-14-74.9 % RE OINT
1.0000 "application " | TOPICAL_OINTMENT | Freq: Four times a day (QID) | RECTAL | Status: DC | PRN
  Filled 2021-06-29: qty 57

## 2021-06-29 MED ORDER — POTASSIUM CHLORIDE 10 MEQ/100ML IV SOLN
10.0000 meq | INTRAVENOUS | Status: AC
  Administered 2021-06-29 (×2): 10 meq via INTRAVENOUS
  Filled 2021-06-29 (×2): qty 100

## 2021-06-29 MED ORDER — MORPHINE SULFATE (PF) 2 MG/ML IV SOLN
2.0000 mg | INTRAVENOUS | Status: DC | PRN
Start: 2021-06-29 — End: 2021-07-03
  Administered 2021-06-29 – 2021-07-02 (×11): 2 mg via INTRAVENOUS
  Filled 2021-06-29 (×11): qty 1

## 2021-06-29 MED ORDER — MORPHINE SULFATE (PF) 4 MG/ML IV SOLN
4.0000 mg | Freq: Once | INTRAVENOUS | Status: AC
  Administered 2021-06-29: 4 mg via INTRAVENOUS
  Filled 2021-06-29: qty 1

## 2021-06-29 MED ORDER — AMITRIPTYLINE HCL 25 MG PO TABS
50.0000 mg | ORAL_TABLET | Freq: Every day | ORAL | Status: DC
  Administered 2021-06-29 – 2021-07-02 (×4): 50 mg via ORAL
  Filled 2021-06-29 (×4): qty 2

## 2021-06-29 MED ORDER — PANTOPRAZOLE SODIUM 40 MG PO TBEC
40.0000 mg | DELAYED_RELEASE_TABLET | Freq: Two times a day (BID) | ORAL | Status: DC
  Administered 2021-06-29 – 2021-07-02 (×8): 40 mg via ORAL
  Filled 2021-06-29 (×8): qty 1

## 2021-06-29 MED ORDER — PROPOFOL 10 MG/ML IV BOLUS
0.5000 mg/kg | Freq: Once | INTRAVENOUS | Status: AC
  Administered 2021-06-29: 45.4 mg via INTRAVENOUS
  Filled 2021-06-29: qty 20

## 2021-06-29 MED ORDER — ONDANSETRON HCL 4 MG/2ML IJ SOLN
4.0000 mg | Freq: Once | INTRAMUSCULAR | Status: AC
  Administered 2021-06-29: 4 mg via INTRAVENOUS
  Filled 2021-06-29: qty 2

## 2021-06-29 MED ORDER — METHOCARBAMOL 500 MG PO TABS
500.0000 mg | ORAL_TABLET | Freq: Four times a day (QID) | ORAL | Status: DC | PRN
  Administered 2021-06-29 – 2021-07-02 (×4): 500 mg via ORAL
  Filled 2021-06-29 (×4): qty 1

## 2021-06-29 MED ORDER — ZINC SULFATE 220 (50 ZN) MG PO CAPS
220.0000 mg | ORAL_CAPSULE | Freq: Every day | ORAL | Status: DC
  Administered 2021-06-29 – 2021-07-02 (×4): 220 mg via ORAL
  Filled 2021-06-29 (×4): qty 1

## 2021-06-29 MED ORDER — MORPHINE SULFATE (PF) 2 MG/ML IV SOLN
2.0000 mg | INTRAVENOUS | Status: DC | PRN
Start: 2021-06-29 — End: 2021-06-29
  Administered 2021-06-29: 2 mg via INTRAVENOUS
  Filled 2021-06-29: qty 1

## 2021-06-29 MED ORDER — GABAPENTIN 400 MG PO CAPS
500.0000 mg | ORAL_CAPSULE | Freq: Three times a day (TID) | ORAL | Status: DC
  Administered 2021-06-29 – 2021-07-02 (×12): 500 mg via ORAL
  Filled 2021-06-29 (×12): qty 1

## 2021-06-29 MED ORDER — LEVOTHYROXINE SODIUM 75 MCG PO TABS
150.0000 ug | ORAL_TABLET | Freq: Every day | ORAL | Status: DC
  Administered 2021-06-29 – 2021-07-02 (×4): 150 ug via ORAL
  Filled 2021-06-29 (×4): qty 2

## 2021-06-29 MED ORDER — TRAMADOL HCL 50 MG PO TABS
50.0000 mg | ORAL_TABLET | Freq: Four times a day (QID) | ORAL | Status: DC | PRN
  Administered 2021-06-29 – 2021-07-02 (×5): 50 mg via ORAL
  Filled 2021-06-29 (×5): qty 1

## 2021-06-29 MED ORDER — ASPIRIN 81 MG PO CHEW
81.0000 mg | CHEWABLE_TABLET | Freq: Every day | ORAL | Status: DC
  Administered 2021-06-29 – 2021-07-02 (×4): 81 mg via ORAL
  Filled 2021-06-29 (×4): qty 1

## 2021-06-29 NOTE — TOC Initial Note (Signed)
Transition of Care [facility]) - Initial/Assessment Note    Patient Details  Name: [name] MRN: [id number] Date of Birth: [birth year]  Transition of Care [facility]) CM/SW Contact:    Shade Flood, LCSW Phone Number: 06/29/2021, 2:54 PM  Clinical Narrative:                  Pt admitted from [place]. Ortho consult indicates pt will have surgery for her ankle fracture on 2/9 and that she does not need to remain in the hospital until then. TOC spoke with Juliann Pulse at [facility] to update on pt's status. Juliann Pulse reports that pt can return to the ALF at dc. Pt may need some DME ordered prior to dc. Juliann Pulse at [facility] aware of pt's positive covid test.  PT/OT pending. Anticipating dc tomorrow. Will follow up in AM.  Expected Discharge Plan: Assisted Living Barriers to Discharge: Continued Medical Work up   Patient Goals and CMS Choice Patient states their goals for this hospitalization and ongoing recovery are:: get better      Expected Discharge Plan and Services Expected Discharge Plan: Assisted Living In-house Referral: Clinical Social Work     Living arrangements for the past 2 months: [place]                                      Prior Living Arrangements/Services Living arrangements for the past 2 months: [place] Lives with:: Facility Resident Patient language and need for interpreter reviewed:: Yes Do you feel safe going back to the place where you live?: Yes      Need for Family Participation in Patient Care: No (Comment) Care giver support system in place?: Yes (comment)   Criminal Activity/Legal Involvement Pertinent to Current Situation/Hospitalization: No - Comment as needed  Activities of Daily Living Home Assistive Devices/Equipment: CBG Meter, Shower chair with back, Wheelchair ADL Screening (condition at time of admission) Patient's cognitive ability adequate to safely complete daily activities?: Yes Is the  patient deaf or have difficulty hearing?: No Does the patient have difficulty seeing, even when wearing glasses/contacts?: Yes (blind) Does the patient have difficulty concentrating, remembering, or making decisions?: No Patient able to express need for assistance with ADLs?: Yes Does the patient have difficulty dressing or bathing?: Yes Independently performs ADLs?: No Communication: Independent Dressing (OT): Dependent Is this a change from baseline?: Pre-admission baseline Grooming: Dependent Is this a change from baseline?: Pre-admission baseline Feeding: Independent Bathing: Dependent Is this a change from baseline?: Pre-admission baseline Toileting: Needs assistance Is this a change from baseline?: Pre-admission baseline In/Out Bed: Needs assistance Is this a change from baseline?: Pre-admission baseline Walks in Home: Dependent (wheelchair bound at baseline) Is this a change from baseline?: Pre-admission baseline Does the patient have difficulty walking or climbing stairs?: Yes Weakness of Legs: Right Weakness of Arms/Hands: None  Permission Sought/Granted                  Emotional Assessment       Orientation: : Oriented to Self, Oriented to Place, Oriented to Situation Alcohol / Substance Use: Not Applicable Psych Involvement: No (comment)  Admission diagnosis:  Fall, initial encounter [W19.XXXA] Closed bimalleolar fracture of right ankle, initial encounter [S82.841A] Bimalleolar fracture of right ankle [S82.841A] Reduction defect of extremity [Q73.8] Patient Active Problem List   Diagnosis Date Noted   Bimalleolar fracture of right ankle 06/29/2021  Hypokalemia 06/29/2021   Hyperglycemia due to diabetes mellitus ([place]) 06/29/2021   Insomnia 06/29/2021   Diabetic neuropathy ([place]) 06/29/2021   COVID-19 virus infection 06/29/2021   Autoimmune hepatitis ([place]) 01/20/2021   Elevated LFTs 12/11/2020   NASH (nonalcoholic steatohepatitis) 12/11/2020   Acute  lower UTI 06/18/2018   Elevated troponin 06/18/2018   UTI (urinary tract infection) 06/18/2018   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 breast ([place]) 06/21/2017   Mastalgia 40/98/1191   Acute diastolic CHF (congestive heart failure) ([place]) 12/20/2016   Adrenal insufficiency ([place]) 12/18/2016   Lobar pneumonia ([place]) 47/82/9562   Acute metabolic encephalopathy 13/12/6576   Thrombocytopenia ([place]) 12/15/2016   CKD (chronic kidney disease) stage 3, GFR 30-59 ml/min (HCC) 12/15/2016   Sepsis ([place]) 12/14/2016   COPD exacerbation ([place]) 04/24/2016   DM type 2 (diabetes mellitus, type 2) ([place]) 04/24/2016   Aortic atherosclerosis ([place]) 04/24/2016   Transaminitis 10/20/2014   Acute bronchitis 10/20/2014   Acute respiratory failure ([place]) 10/17/2014   IBS (irritable bowel syndrome) 08/07/2012   RLQ abdominal pain 10/05/2011   Arteriosclerotic cardiovascular disease (ASCVD)    Tobacco abuse, in remission    Gastroesophageal reflux disease    OBESITY 04/01/2010   BLINDNESS 04/01/2010   Hypothyroidism 09/29/2009   Hyperlipidemia, unspecified 09/29/2009   Essential hypertension 09/29/2009   PCP:  Darrol Jump, NP Pharmacy:   [place], [place] - [address] [place] [address] Phone: [phone] Fax: [phone]     Social Determinants of Health (SDOH) Interventions    Readmission Risk Interventions No flowsheet data found.
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
## 2021-06-29 NOTE — ED Provider Notes (Signed)
[facility] EMERGENCY DEPARTMENT Provider Note   CSN: [id number] Arrival date & time: 06/28/21  2340     History  Chief Complaint  Patient presents with   [name] is a 76 y.o. female.  Patient is a 76 year old female with extensive past medical history including congestive heart failure, diabetes, hypertension, COPD, and blindness.  Patient lives in an assisted living facility where she fell this evening injuring her right ankle.  She was unable to ambulate, and brought here by EMS.  She denies other injury during the fall.  The history is provided by the patient.  Fall This is a new problem. The current episode started less than 1 hour ago. The problem occurs constantly. The problem has not changed since onset.Pertinent negatives include no chest pain and no headaches. Exacerbated by: Movement and palpation. Nothing relieves the symptoms. She has tried nothing for the symptoms.      Home Medications Prior to Admission medications   Medication Sig Start Date End Date Taking? Authorizing Provider  acetaminophen (TYLENOL) 325 MG tablet Take 650 mg by mouth every 6 (six) hours as needed for moderate pain.    [provider]  amitriptyline (ELAVIL) 50 MG tablet Take 50 mg by mouth at bedtime.    [provider]  amLODipine (NORVASC) 5 MG tablet Take 1 tablet (5 mg total) by mouth daily. 03/04/21   Arnoldo Lenis, MD  aspirin 81 MG tablet Take 81 mg by mouth at bedtime.    [provider]  azaTHIOprine (IMURAN) 50 MG tablet Take 1 tablet (50 mg total) by mouth daily. 01/19/21   Harvel Quale, MD  calcium carbonate (TUMS - DOSED IN MG ELEMENTAL CALCIUM) 500 MG chewable tablet Chew 1,000 mg by mouth every 8 (eight) hours as needed for heartburn.    [provider]  Cholecalciferol 50 MCG (2000 UT) TABS Take 2,000 Units by mouth daily at 6 (six) AM.    [provider]  ciprofloxacin (CIPRO) 500 MG tablet Take 500 mg  by mouth 2 (two) times daily. One tablet bid for 5 days for UTI    [provider]  gabapentin (NEURONTIN) 100 MG capsule Take 500 mg by mouth 3 (three) times daily.  11/03/19   [provider]  glipiZIDE (GLUCOTROL) 5 MG tablet Take 5 mg by mouth 2 (two) times daily. 10/02/19   [provider]  insulin lispro (HUMALOG) 100 UNIT/ML KwikPen Inject 2-15 Units into the skin 3 (three) times daily before meals. 121-150=2 units, 151-200= 3 units, 201-250 = 5 units, 251-300= 8 units, 301-400 = 15 units    [provider]  LANTUS SOLOSTAR 100 UNIT/ML Solostar Pen Inject 25 Units into the skin at bedtime. 06/22/20   [provider]  levothyroxine (SYNTHROID) 150 MCG tablet Take 150 mcg by mouth in the morning.    [provider]  losartan (COZAAR) 100 MG tablet Take 100 mg by mouth at bedtime.     [provider]  magnesium oxide (MAG-OX) 400 MG tablet Take 400 mg by mouth daily.    [provider]  Melatonin 5 MG CAPS Take 5 mg by mouth at bedtime.    [provider]  metoprolol succinate (TOPROL XL) 25 MG 24 hr tablet Take 1 tablet (25 mg total) by mouth daily. 07/03/20   Arnoldo Lenis, MD  pantoprazole (PROTONIX) 40 MG tablet Take 40 mg by mouth 2 (two) times daily.    [provider]  Phenylephrine-Mineral Oil-Pet (HEMORRHOIDAL) 0.25-14-71.9 % OINT Place 1 application rectally every 6 (six) hours as needed (hemorrhoid discomfort).    [provider]  predniSONE (DELTASONE) 5 MG tablet Take 8 tablets (40 mg total) by mouth daily with breakfast. 01/20/21   Rehman, Mechele Dawley, MD  valACYclovir (VALTREX) 1000 MG tablet Take 1,000 mg by mouth 3 (three) times daily. One tablet every 8 hours for 7 days for shingles.    [provider]      Allergies    Codeine    Review of Systems   Review of Systems  Cardiovascular:  Negative for chest pain.  Neurological:  Negative for headaches.  All other systems  reviewed and are negative.  Physical Exam Updated Vital Signs BP 107/87    Pulse (!) 55    Temp 98.3 F (36.8 C)    Resp 19    Ht 5\' 1"  (1.549 m)    Wt 90.7 kg    SpO2 92%    BMI 37.79 kg/m  Physical Exam Vitals and nursing note reviewed.  Constitutional:      General: She is not in acute distress.    Appearance: She is well-developed. She is not diaphoretic.  HENT:     Head: Normocephalic and atraumatic.  Cardiovascular:     Rate and Rhythm: Normal rate and regular rhythm.     Heart sounds: No murmur heard.   No friction rub. No gallop.  Pulmonary:     Effort: Pulmonary effort is normal. No respiratory distress.     Breath sounds: Normal breath sounds. No wheezing.  Abdominal:     General: Bowel sounds are normal. There is no distension.     Palpations: Abdomen is soft.     Tenderness: There is no abdominal tenderness.  Musculoskeletal:        General: Normal range of motion.     Cervical back: Normal range of motion and neck supple.     Comments: The right ankle has obvious deformity with tenting of the skin at the medial malleolus.  DP pulses are palpable and motor and sensation are intact throughout the entire foot.  Skin:    General: Skin is warm and dry.  Neurological:     General: No focal deficit present.     Mental Status: She is alert and oriented to person, place, and time.    ED Results / Procedures / Treatments   Labs (all labs ordered are listed, but only abnormal results are displayed) Labs Reviewed  BASIC METABOLIC PANEL - Abnormal; Notable for the following components:      Result Value   Potassium 3.0 (*)    Glucose, Bld 164 (*)    Calcium 8.5 (*)    All other components within normal limits  CBC WITH DIFFERENTIAL/PLATELET - Abnormal; Notable for the following components:   RBC 3.79 (*)    All other components within normal limits    EKG None  Radiology DG Ankle Complete Right  Result Date: 06/29/2021 CLINICAL DATA:  Fall, ankle  pain/deformity EXAM: RIGHT ANKLE - COMPLETE 3+ VIEW COMPARISON:  None. FINDINGS: Comminuted distal fibular fracture, mildly displaced with apex medial angulation. Mildly displaced medial malleolar fracture. Associated lateral displacement of the talus relative to the tibia. Mild soft tissue swelling with deformity. IMPRESSION: Bimalleolar fracture dislocation, as above. Electronically Signed   By: Julian Hy M.D.   On: 06/29/2021 01:14   DG Knee Complete 4 Views Right  Result Date: 06/29/2021 CLINICAL  DATA:  Fall EXAM: RIGHT KNEE - COMPLETE 4+ VIEW COMPARISON:  None. FINDINGS: No evidence of fracture, dislocation, or joint effusion. No evidence of arthropathy or other focal bone abnormality. Soft tissues are unremarkable. IMPRESSION: Negative. Electronically Signed   By: Ulyses Jarred M.D.   On: 06/29/2021 01:14    Procedures Reduction of fracture  Date/Time: 06/29/2021 2:59 AM Performed by: Veryl Speak, MD Authorized by: Veryl Speak, MD  Consent: Verbal consent obtained. Written consent obtained. Risks and benefits: risks, benefits and alternatives were discussed Consent given by: patient Patient understanding: patient states understanding of the procedure being performed Patient consent: the patient's understanding of the procedure matches consent given Procedure consent: procedure consent matches procedure scheduled Relevant documents: relevant documents present and verified Test results: test results available and properly labeled Patient identity confirmed: verbally with patient and arm band Time out: Immediately prior to procedure a "time out" was called to verify the correct patient, procedure, equipment, support staff and site/side marked as required. Local anesthesia used: no  Anesthesia: Local anesthesia used: no  Sedation: Patient sedated: yes Sedation type: moderate (conscious) sedation Sedatives: propofol Sedation start date/time: 06/29/2021 2:20 AM Sedation end  date/time: 06/29/2021 2:35 AM Vitals: Vital signs were monitored during sedation.  Patient tolerance: patient tolerated the procedure well with no immediate complications Comments: Fracture/dislocation was reduced and splinted.  Patient tolerated procedure well.   .Sedation  Date/Time: 06/29/2021 3:01 AM Performed by: Veryl Speak, MD Authorized by: Veryl Speak, MD   Consent:    Consent obtained:  Verbal and written   Consent given by:  Patient   Risks discussed:  Allergic reaction, prolonged sedation necessitating reversal, nausea, inadequate sedation and vomiting Universal protocol:    Procedure explained and questions answered to patient or proxy's satisfaction: yes     Relevant documents present and verified: yes     Test results available: yes     Immediately prior to procedure, a time out was called: yes   Indications:    Procedure performed:  Fracture reduction   Procedure necessitating sedation performed by:  Physician performing sedation Pre-sedation assessment:    Time since last food or drink:  8 hrs   ASA classification: class 3 - patient with severe systemic disease     Mallampati score:  I - soft palate, uvula, fauces, pillars visible   Neck mobility: normal     Pre-sedation assessments completed and reviewed: airway patency, cardiovascular function, mental status and respiratory function   Immediate pre-procedure details:    Reassessment: Patient reassessed immediately prior to procedure     Reviewed: vital signs, relevant labs/tests and NPO status     Verified: bag valve mask available, emergency equipment available, intubation equipment available, IV patency confirmed and oxygen available   Procedure details (see MAR for exact dosages):    Preoxygenation:  Nasal cannula   Sedation:  Propofol   Intended level of sedation: deep and moderate (conscious sedation)   Analgesia:  Morphine   Intra-procedure monitoring:  Blood pressure monitoring, continuous  capnometry, frequent LOC assessments, cardiac monitor, continuous pulse oximetry and frequent vital sign checks   Intra-procedure events: none     Total Provider sedation time (minutes):  15 Post-procedure details:    Post-sedation assessment completed:  06/29/2021 3:02 AM   Attendance: Constant attendance by certified staff until patient recovered     Recovery: Patient returned to pre-procedure baseline     Patient is stable for discharge or admission: yes     Procedure completion:  Tolerated well, no immediate complications  Conscious sedation for reduction of bimalleolar ankle fracture.  See procedure note.  Medications Ordered in ED Medications  propofol (DIPRIVAN) 10 mg/mL bolus/IV push 45.4 mg (has no administration in time range)  morphine 4 MG/ML injection 4 mg (4 mg Intravenous Given 06/29/21 0036)  ondansetron (ZOFRAN) injection 4 mg (4 mg Intravenous Given 06/29/21 0034)    ED Course/ Medical Decision Making/ A&P  This patient presents to the ED for concern of fall with right ankle injury, this involves an extensive number of treatment options, and is a complaint that carries with it a high risk of complications and morbidity.  The differential diagnosis includes ankle fracture/dislocation   Co morbidities that complicate the patient evaluation  None   Additional history obtained:  No additional history required or outside records required   Lab Tests:  I Ordered, and personally interpreted labs.  The pertinent results include: Unremarkable CBC and basic metabolic panel   Imaging Studies ordered:  I ordered imaging studies including x-rays of the right ankle I independently visualized and interpreted imaging which showed bimalleolar right ankle fracture/dislocation I agree with the radiologist interpretation   Cardiac Monitoring:  The patient was maintained on a cardiac monitor.  I personally viewed and interpreted the cardiac monitored which showed an  underlying rhythm of: Sinus rhythm   Medicines ordered and prescription drug management:  I ordered medication including morphine for pain and propofol for conscious sedation (please see procedure note). Reevaluation of the patient after these medicines showed that the patient improved I have reviewed the patients home medicines and have made adjustments as needed   Test Considered:  No other test considered   Critical Interventions:  Conscious sedation for reduction of fracture/dislocation   Consultations Obtained:  I requested consultation with the orthopedic surgeon, Dr. Amedeo Kinsman,  and discussed lab and imaging findings as well as pertinent plan - they recommend: Admission to the hospitalist service.  He will see the patient in the morning and determine the appropriate intervention. I have also discussed with Dr. Josephine Cables from the hospitalist service who will admit.   Problem List / ED Course:  Patient presenting with complaints of a fall at her assisted living facility.  She has extensive past medical history including CHF, COPD, diabetes, and blindness.  Patient already has somewhat limited mobility and today's fall has caused a bimalleolar fracture/dislocation for which I do not feel is safe for her to return home.  The fracture was discussed with the orthopedic surgeon, Dr. Amedeo Kinsman and patient will be admitted to the hospital for pain control and contemplation of surgical repair. The fracture was reduced under conscious sedation with postreduction films showing adequate positioning.   Reevaluation:  After the interventions noted above, I reevaluated the patient and found that they have :improved   Social Determinants of Health:  Patient is a resident of an assisted living facility and also has blindness and limited mobility   Dispostion:  After consideration of the diagnostic results and the patients response to treatment, I feel that the patent would benefit from  admission to the hospital for orthopedic intervention.    Final Clinical Impression(s) / ED Diagnoses Final diagnoses:  None    Rx / DC Orders ED Discharge Orders     None         Veryl Speak, MD 06/29/21 [phone]

## 2021-06-29 NOTE — Progress Notes (Addendum)
From an ALF.  Fell last PM and Right ankle x-ray showed bimalleolar fracture dislocation.  This was reduced in the ER by Dr. Stark Jock.  Ortho to see.  Will hold on PT/OT until weight bearing status determined by ortho.   Incidentally found to be COVID positive- no symptoms,  will not treat. 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

## 2021-06-29 NOTE — Care Management CC44 (Signed)
Condition Code 44 Documentation Completed  Patient Details  Name: [name] MRN: [id number] Date of Birth: 11-07-1945   Condition Code 44 given:  Yes Patient signature on Condition Code 44 notice:  Yes Documentation of 2 MD's agreement:  Yes Code 44 added to claim:  Yes    Iona Beard, [place] 06/29/2021, 3:43 PM
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
## 2021-06-29 NOTE — Progress Notes (Signed)
Hypoglycemic Event  CBG: 61   Treatment: D50 25 mL (12.5 gm)  Symptoms: None  Follow-up CBG: Time:0745 CBG Result:106  Possible Reasons for Event: Other: NPO  Comments/MD notified:    Laretta Bolster

## 2021-06-29 NOTE — Progress Notes (Signed)
Initial Nutrition Assessment  DOCUMENTATION CODES:   Obesity unspecified  INTERVENTION:  - Encourage PO intake - Recommend diet liberalization from heart healthy/carb modified to a carb modified diet to encourage adequate PO intake; spoke with MD who agrees - Chopped meats for ease of chewing foods  NUTRITION DIAGNOSIS:   Increased nutrient needs related to other (see comment) (R ankle fracture) as evidenced by estimated needs.   GOAL:   Patient will meet greater than or equal to 90% of their needs  MONITOR:   PO intake, Supplement acceptance, Labs, Weight trends  REASON FOR ASSESSMENT:   Consult Hip fracture protocol  ASSESSMENT:   Pt admitted from [facility] after a fall causing bimalleolar fracture of R ankle. Incidentally found to be COVID+. PMH includes COPD, HTN, HLD, hypothyroidism, CAD s/p stent placement, GERD, T2DM, legally blind and obesity.  Per ortho MD, pt will need ORIF of R ankle, tentatively planned for 07/09/21 as outpatient. However, pt may need to remain admitted d/t COVID+ if unable to return to ALF.  Pt in significant pain during assessment, reported this to her RN. Pt reports eating 3 meals per day at her ALF. She states that although the food is lacking flavor (salt and butter), she typically eats 100% of her meals. She has no teeth, so she states that she typically eats soft foods. Will place chopped meats modifier to help with ease of chewing foods.  Pt reports a usual weight of around 189 lbs but did not report whether she has had recent weight loss. Noted a 5% weight loss from 03/26/21-06/29/21.  Medications: vitamin C, SSI, semglee 6 units daily, mag-ox, protonix, prednisone, zinc, IV KCl  Labs: potassium 3.0 (L), HgbA1c 7.7%, CBG's 61-103 x6 hours  NUTRITION - FOCUSED PHYSICAL EXAM: Deferred to follow up  Diet Order:   Diet Order             Diet Carb Modified Fluid consistency: Thin; Room service appropriate? Yes  Diet effective now                    EDUCATION NEEDS:   Not appropriate for education at this time  Skin:  Skin Assessment: Reviewed RN Assessment  Last BM:  unknown  Height:   Ht Readings from Last 1 Encounters:  06/29/21 5\' 1"  (1.549 m)    Weight:   Wt Readings from Last 1 Encounters:  06/29/21 91.8 kg    Ideal Body Weight:  47.7 kg  BMI:  Body mass index is 38.24 kg/m.  Estimated Nutritional Needs:   Kcal:  4034-7425  Protein:  90-105g  Fluid:  >/=1.75L  Clayborne Dana, RDN, LDN Clinical Nutrition

## 2021-06-29 NOTE — Care Management Obs Status (Signed)
[facility] NOTIFICATION   Patient Details  Name: [name] MRN: [id number] Date of Birth: June 11, 1945   Medicare Observation Status Notification Given:  Yes    Iona Beard, Latanya Presser 06/29/2021, 3:43 PM
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
## 2021-06-30 DIAGNOSIS — I10 Essential (primary) hypertension: Secondary | ICD-10-CM | POA: Diagnosis not present

## 2021-06-30 DIAGNOSIS — S82841A Displaced bimalleolar fracture of right lower leg, initial encounter for closed fracture: Secondary | ICD-10-CM | POA: Diagnosis not present

## 2021-06-30 DIAGNOSIS — U071 COVID-19: Secondary | ICD-10-CM | POA: Diagnosis not present

## 2021-06-30 LAB — BASIC METABOLIC PANEL
Anion gap: 10 (ref 5–15)
BUN: 14 mg/dL (ref 8–23)
CO2: 23 mmol/L (ref 22–32)
Calcium: 8.4 mg/dL — ABNORMAL LOW (ref 8.9–10.3)
Chloride: 106 mmol/L (ref 98–111)
Creatinine, Ser: 0.74 mg/dL (ref 0.44–1.00)
GFR, Estimated: >60 mL/min (ref >60)
Glucose, Bld: 107 mg/dL — ABNORMAL HIGH (ref 70–99)
Potassium: 3.6 mmol/L (ref 3.5–5.1)
Sodium: 139 mmol/L (ref 135–145)

## 2021-06-30 LAB — CBC
HCT: 36.5 % (ref 36.0–46.0)
Hemoglobin: 11.1 g/dL — ABNORMAL LOW (ref 12.0–15.0)
MCH: 30.7 pg (ref 26.0–34.0)
MCHC: 30.4 g/dL (ref 30.0–36.0)
MCV: 100.8 fL — ABNORMAL HIGH (ref 80.0–100.0)
Platelets: 187 10*3/uL (ref 150–400)
RBC: 3.62 MIL/uL — ABNORMAL LOW (ref 3.87–5.11)
RDW: 14.7 % (ref 11.5–15.5)
WBC: 8.5 10*3/uL (ref 4.0–10.5)
nRBC: 0 % (ref 0.0–0.2)

## 2021-06-30 LAB — HEPATIC FUNCTION PANEL
ALT: 25 U/L (ref 0–44)
AST: 34 U/L (ref 15–41)
Albumin: 3.1 g/dL — ABNORMAL LOW (ref 3.5–5.0)
Alkaline Phosphatase: 47 U/L (ref 38–126)
Bilirubin, Direct: 0.3 mg/dL — ABNORMAL HIGH (ref 0.0–0.2)
Indirect Bilirubin: 0.4 mg/dL (ref 0.3–0.9)
Total Bilirubin: 0.7 mg/dL (ref 0.3–1.2)
Total Protein: 6.4 g/dL — ABNORMAL LOW (ref 6.5–8.1)

## 2021-06-30 LAB — GLUCOSE, CAPILLARY
Glucose-Capillary: 115 mg/dL — ABNORMAL HIGH (ref 70–99)
Glucose-Capillary: 160 mg/dL — ABNORMAL HIGH (ref 70–99)
Glucose-Capillary: 262 mg/dL — ABNORMAL HIGH (ref 70–99)
Glucose-Capillary: 173 mg/dL — ABNORMAL HIGH (ref 70–99)

## 2021-06-30 LAB — VITAMIN D 25 HYDROXY (VIT D DEFICIENCY, FRACTURES): Vit D, 25-Hydroxy: 45.8 ng/mL (ref 30–100)

## 2021-06-30 LAB — MAGNESIUM: Magnesium: 1.8 mg/dL (ref 1.7–2.4)

## 2021-06-30 MED ORDER — SODIUM CHLORIDE 0.9 % IV SOLN
100.0000 mg | INTRAVENOUS | Status: AC
  Administered 2021-06-30 (×2): 100 mg via INTRAVENOUS
  Filled 2021-06-30: qty 20

## 2021-06-30 MED ORDER — METOPROLOL SUCCINATE ER 25 MG PO TB24
25.0000 mg | ORAL_TABLET | Freq: Every day | ORAL | Status: DC
  Administered 2021-06-30 – 2021-07-02 (×3): 25 mg via ORAL
  Filled 2021-06-30 (×3): qty 1

## 2021-06-30 MED ORDER — SODIUM CHLORIDE 0.9 % IV SOLN
100.0000 mg | Freq: Every day | INTRAVENOUS | Status: DC
  Administered 2021-07-01 – 2021-07-02 (×2): 100 mg via INTRAVENOUS
  Filled 2021-06-30 (×3): qty 20

## 2021-06-30 MED ORDER — MUPIROCIN 2 % EX OINT
1.0000 "application " | TOPICAL_OINTMENT | Freq: Two times a day (BID) | CUTANEOUS | Status: DC
  Administered 2021-06-30 – 2021-07-02 (×6): 1 via NASAL
  Filled 2021-06-30: qty 22

## 2021-06-30 MED ORDER — CHLORHEXIDINE GLUCONATE CLOTH 2 % EX PADS
6.0000 | MEDICATED_PAD | Freq: Every day | CUTANEOUS | Status: DC
  Administered 2021-06-30 – 2021-07-02 (×3): 6 via TOPICAL

## 2021-06-30 NOTE — Plan of Care (Signed)
°  Problem: Acute Rehab OT Goals (only OT should resolve) Goal: Pt. Will Perform Grooming Flowsheets (Taken 06/30/2021 1047) Pt Will Perform Grooming:  sitting  with min assist Goal: Pt. Will Perform Upper Body Dressing Flowsheets (Taken 06/30/2021 1047) Pt Will Perform Upper Body Dressing:  with set-up  sitting Goal: Pt. Will Perform Lower Body Dressing Flowsheets (Taken 06/30/2021 1047) Pt Will Perform Lower Body Dressing:  with min assist  with mod assist  sitting/lateral leans  with adaptive equipment Goal: Pt. Will Transfer To Toilet Flowsheets (Taken 06/30/2021 1047) Pt Will Transfer to Toilet:  with max assist  with mod assist  stand pivot transfer Goal: Pt/Caregiver Will Perform Home Exercise Program Flowsheets (Taken 06/30/2021 1047) Pt/caregiver will Perform Home Exercise Program:  Increased strength  Both right and left upper extremity  With Supervision  Colsen Modi OT, MOT

## 2021-06-30 NOTE — Evaluation (Signed)
Physical Therapy Evaluation Patient Details Name: [name] MRN: [id number] DOB: Jun 21, 1945 Today's Date: 06/30/2021  History of Present Illness  [name] is a 76 y.o. female with medical history significant for COPD, hypertension, hyperlipidemia, hypothyroidism, CAD s/p stent placement, GERD, T2DM, legally blind and obesity who presents to the emergency department via EMS due to fall sustained in the evening yesterday prior to arrival to the ED.  Patient states that she was going to her bathroom when her legs suddenly gave up on her and she sustained a fall injuring her right ankle with difficulty in being able to bear weight on same leg, patient states that she did hit her head on the bathroom door when she fell.  However, she denies loss of consciousness, chest pain, shortness of breath, headache, nosebleed, cough, shortness of breath, nausea, vomiting, abdominal pain, numbness or tingling.   Clinical Impression  Patient c/o severe pain with any movement, pressure to RLE and limited for functional mobility and attempts at sitting up.  Patient is Mod assist for partially sitting up with LLE hanging off side with poor carryover for attempting to pull self to sitting due to c/o severe RLE pain with any movement or pressure.  Patient demonstrates fair return for repositioning self in bed with frequent verbal/tactile cueing and able pull self to [facility] using BUE and LLE.  Nurse notified that patient requesting more pain medication.  Patient will benefit from continued skilled physical therapy in hospital and recommended venue below to increase strength, balance, endurance for safe ADLs and gait.      Recommendations for follow up therapy are one component of a multi-disciplinary discharge planning process, led by the attending physician.  Recommendations may be updated based on patient status, additional functional criteria and insurance authorization.  Follow Up Recommendations Home health  PT    Assistance Recommended at Discharge Intermittent Supervision/Assistance  Patient can return home with the following  A lot of help with bathing/dressing/bathroom;A lot of help with walking and/or transfers;Assistance with cooking/housework;Help with stairs or ramp for entrance    Equipment Recommendations Hospital bed  Recommendations for Other Services       Functional Status Assessment Patient has had a recent decline in their functional status and demonstrates the ability to make significant improvements in function in a reasonable and predictable amount of time.     Precautions / Restrictions Precautions Precautions: Fall Restrictions Weight Bearing Restrictions: Yes RLE Weight Bearing: Non weight bearing      Mobility  Bed Mobility Overal bed mobility: Needs Assistance Bed Mobility: Rolling, Supine to Sit, Sit to Supine Rolling: Min assist, Mod assist   Supine to sit: Mod assist Sit to supine: Mod assist   General bed mobility comments: Mod assist for partially sitting up with LLE hanging off side, poor carryover for attempting to pull self to sitting due to c/o severe RLE pain with any movement or pressure    Transfers                        Ambulation/Gait                  Stairs            Wheelchair Mobility    Modified Rankin (Stroke Patients Only)       Balance Overall balance assessment: Needs assistance Sitting-balance support: Feet unsupported, Bilateral upper extremity supported Sitting balance-Leahy Scale: Poor Sitting balance - Comments: limited to partially sitting with  LLE hanging off side of bed supporting self with BUE Postural control: Posterior lean                                   Pertinent Vitals/Pain Pain Assessment Pain Assessment: Faces Faces Pain Scale: Hurts whole lot Pain Location: R LE with movement. Pain Descriptors / Indicators: Sore, Guarding, Grimacing, Moaning Pain  Intervention(s): Limited activity within patient's tolerance, Monitored during session, Repositioned, Patient requesting pain meds-RN notified    Home Living Family/patient expects to be discharged to:: Assisted living                 Home Equipment: Wheelchair - manual      Prior Function Prior Level of Function : Needs assist       Physical Assist : Mobility (physical);ADLs (physical) Mobility (physical): Bed mobility;Transfers;Gait ADLs (physical): IADLs Mobility Comments: Pt completes household mobility with w/c. Pt reports transfering to w/c without AD. ADLs Comments: Pt reports independence with ADL's with ALF staff assisting with IADL's.     Hand Dominance   Dominant Hand: Right    Extremity/Trunk Assessment   Upper Extremity Assessment Upper Extremity Assessment: Defer to OT evaluation    Lower Extremity Assessment Lower Extremity Assessment: Generalized weakness;RLE deficits/detail RLE Deficits / Details: grossly 3-/5 RLE: Unable to fully assess due to pain;Unable to fully assess due to immobilization RLE Sensation: WNL RLE Coordination: WNL    Cervical / Trunk Assessment Cervical / Trunk Assessment: Normal  Communication   Communication: No difficulties  Cognition Arousal/Alertness: Awake/alert Behavior During Therapy: WFL for tasks assessed/performed Overall Cognitive Status: Within Functional Limits for tasks assessed                                          General Comments      Exercises     Assessment/Plan    PT Assessment Patient needs continued PT services  PT Problem List Decreased strength;Decreased activity tolerance;Decreased balance;Decreased mobility       PT Treatment Interventions DME instruction;Functional mobility training;Therapeutic activities;Therapeutic exercise;Balance training;Wheelchair mobility training;Patient/family education    PT Goals (Current goals can be found in the Care Plan section)   Acute Rehab PT Goals Patient Stated Goal: return home with ALF staff to assist PT Goal Formulation: With patient Time For Goal Achievement: 07/07/21 Potential to Achieve Goals: Good    Frequency Min 3X/week     Co-evaluation PT/OT/SLP Co-Evaluation/Treatment: Yes Reason for Co-Treatment: Complexity of the patient's impairments (multi-system involvement);To address functional/ADL transfers PT goals addressed during session: Mobility/safety with mobility;Balance;Proper use of DME OT goals addressed during session: ADL's and self-care       AM-PAC PT "6 Clicks" Mobility  Outcome Measure Help needed turning from your back to your side while in a flat bed without using bedrails?: A Lot Help needed moving from lying on your back to sitting on the side of a flat bed without using bedrails?: A Lot Help needed moving to and from a bed to a chair (including a wheelchair)?: Total Help needed standing up from a chair using your arms (e.g., wheelchair or bedside chair)?: Total Help needed to walk in hospital room?: Total Help needed climbing 3-5 steps with a railing? : Total 6 Click Score: 8    End of Session   Activity Tolerance: Patient limited by fatigue;Patient limited  by pain Patient left: in bed;with call bell/phone within reach Nurse Communication: Mobility status PT Visit Diagnosis: Unsteadiness on feet (R26.81);Other abnormalities of gait and mobility (R26.89);Muscle weakness (generalized) (M62.81)    Time: 1916-6060 PT Time Calculation (min) (ACUTE ONLY): 30 min   Charges:   PT Evaluation $PT Eval Moderate Complexity: 1 Mod PT Treatments $Therapeutic Activity: 23-37 mins        11:59 AM, 06/30/21 Lonell Grandchild, MPT Physical Therapist with [facility] [phone] office [phone] mobile phone
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
## 2021-06-30 NOTE — Progress Notes (Signed)
Patient has been restless this shift and pulling off tele x5 and pulled out her IV. Patient states "I do not know why I keep pulling at stuff but I don't have anything to keep me busy." Patient was given wash cloth to play with but continues to pull at anything she can touch and trying to take the dressing off the right lower leg/ankle. PRN medication provided.

## 2021-06-30 NOTE — TOC Progression Note (Addendum)
Transition of Care [facility]) - Progression Note    Patient Details  Name: [name] MRN: [id number] Date of Birth: April 27, 1946  Transition of Care [facility]) CM/SW [place], [place] Phone Number: 06/30/2021, 1:31 PM  Clinical Narrative:    CSW spoke to [place] with [facility] who states she can accept pts referral for [facility] PT. CSW requested that MD place [facility] PT orders. TOC to follow.   Expected Discharge Plan: Assisted Living Barriers to Discharge: Continued Medical Work up  Expected Discharge Plan and Services Expected Discharge Plan: Assisted Living In-house Referral: Clinical Social Work     Living arrangements for the past 2 months: [place]                                       Social Determinants of Health (SDOH) Interventions    Readmission Risk Interventions No flowsheet data found.

## 2021-06-30 NOTE — Plan of Care (Signed)
°  Problem: Acute Rehab PT Goals(only PT should resolve) Goal: Pt Will Go Supine/Side To Sit Outcome: Progressing Flowsheets (Taken 06/30/2021 1201) Pt will go Supine/Side to Sit:  with supervision  with min guard assist Goal: Patient Will Perform Sitting Balance Outcome: Progressing Flowsheets (Taken 06/30/2021 1201) Patient will perform sitting balance:  with supervision  with modified independence Goal: Patient Will Transfer Sit To/From Stand Outcome: Progressing Flowsheets (Taken 06/30/2021 1201) Patient will transfer sit to/from stand:  with minimal assist  with moderate assist Goal: Pt Will Transfer Bed To Chair/Chair To Bed Outcome: Progressing Flowsheets (Taken 06/30/2021 1201) Pt will Transfer Bed to Chair/Chair to Bed:  with min assist  with mod assist   12:01 PM, 06/30/21 Lonell Grandchild, MPT Physical Therapist with [facility] [phone] office [phone] mobile phone

## 2021-06-30 NOTE — Evaluation (Signed)
Occupational Therapy Evaluation Patient Details Name: [name] MRN: [id number] DOB: Oct 22, 1945 Today's Date: 06/30/2021   History of Present Illness [name] is a 76 y.o. female with medical history significant for COPD, hypertension, hyperlipidemia, hypothyroidism, CAD s/p stent placement, GERD, T2DM, legally blind and obesity who presents to the emergency department via EMS due to fall sustained in the evening yesterday prior to arrival to the ED.  Patient states that she was going to her bathroom when her legs suddenly gave up on her and she sustained a fall injuring her right ankle with difficulty in being able to bear weight on same leg, patient states that she did hit her head on the bathroom door when she fell.  However, she denies loss of consciousness, chest pain, shortness of breath, headache, nosebleed, cough, shortness of breath, nausea, vomiting, abdominal pain, numbness or tingling.   Clinical Impression   Pt agreeable to PT and OT co-evaluation. Pt limited secondary to pain primarily in R LE. Pt very avoidant to movement of R LE. Pt demonstrates WFL B UE A/ROM but again, is limited by pain in R LE. Pt able to roll in bed and achieve partial sitting propping on elbows. Pt required total assist to don L sock today. Pt is blind at baseline. Pt left in bed with call bell within reach. Pt will benefit from continued OT in the hospital and recommended venue below to increase strength, balance, and endurance for safe ADL's.      Recommendations for follow up therapy are one component of a multi-disciplinary discharge planning process, led by the attending physician.  Recommendations may be updated based on patient status, additional functional criteria and insurance authorization.   Follow Up Recommendations  Home health OT (at assisted living facility)    Assistance Recommended at Discharge Intermittent Supervision/Assistance  Patient can return home with the following A lot  of help with walking and/or transfers;A lot of help with bathing/dressing/bathroom;Assistance with cooking/housework;Assist for transportation;Help with stairs or ramp for entrance    Functional Status Assessment  Patient has had a recent decline in their functional status and demonstrates the ability to make significant improvements in function in a reasonable and predictable amount of time.  Equipment Recommendations  Hospital bed          Precautions / Restrictions Precautions Precautions: Fall Restrictions Weight Bearing Restrictions: Yes RLE Weight Bearing: Non weight bearing      Mobility Bed Mobility Overal bed mobility: Needs Assistance Bed Mobility: Rolling Rolling: Min assist, Mod assist         General bed mobility comments: Attemtped supine to sit bed mobility but pt unable to achieve full upright sitting due to c/o pain in R LE. Pt able to roll and scoot in bed with use of rails and min to mod A.    Transfers                          Balance Overall balance assessment: Needs assistance     Sitting balance - Comments: Pt did not achieve full upright sitting. Pt leaning on R UE without full sit due to pain.                                   ADL either performed or assessed with clinical judgement   ADL Overall ADL's : Needs assistance/impaired     Grooming: Bed  level;Minimal assistance       Lower Body Bathing: Total assistance;Bed level   Upper Body Dressing : Minimal assistance;Sitting   Lower Body Dressing: Bed level;Total assistance Lower Body Dressing Details (indicate cue type and reason): total assist to don sock on L LE Toilet Transfer: Maximal assistance;Moderate assistance;Stand-pivot;Rolling walker (2 wheels) Toilet Transfer Details (indicate cue type and reason): Pt unable to attempt today due to pain. Likely mod to max A.           General ADL Comments: Pt very limited by pain today. Bed mobility completed  only.     Vision Baseline Vision/History: 2 Legally blind Ability to See in Adequate Light: 4 Severely impaired Patient Visual Report: No change from baseline Vision Assessment?:  (severely impaired at baseline)                Pertinent Vitals/Pain Pain Assessment Pain Assessment: Faces Faces Pain Scale: Hurts whole lot Pain Location: R LE with movement.     Hand Dominance Right   Extremity/Trunk Assessment Upper Extremity Assessment Upper Extremity Assessment: Generalized weakness   Lower Extremity Assessment Lower Extremity Assessment: Defer to PT evaluation       Communication Communication Communication: No difficulties   Cognition Arousal/Alertness: Awake/alert Behavior During Therapy: WFL for tasks assessed/performed Overall Cognitive Status: Within Functional Limits for tasks assessed                                                        Home Living Family/patient expects to be discharged to:: Assisted living                             Home Equipment: Wheelchair - manual          Prior Functioning/Environment Prior Level of Function : Needs assist       Physical Assist : Mobility (physical);ADLs (physical) Mobility (physical): Transfers;Gait ADLs (physical): IADLs Mobility Comments: Pt completes household mobility with w/c. Pt reports transfering to w/c without AD. ADLs Comments: Pt reports independence with ADL's with ALF staff assisting with IADL's.        OT Problem List: Decreased strength;Decreased activity tolerance;Impaired balance (sitting and/or standing);Pain;Obesity      OT Treatment/Interventions: Self-care/ADL training;Therapeutic exercise;Therapeutic activities;Patient/family education;Balance training    OT Goals(Current goals can be found in the care plan section) Acute Rehab OT Goals Patient Stated Goal: Have less pain. OT Goal Formulation: With patient Time For Goal Achievement:  07/14/21 Potential to Achieve Goals: Fair  OT Frequency: Min 2X/week    Co-evaluation PT/OT/SLP Co-Evaluation/Treatment: Yes Reason for Co-Treatment: Complexity of the patient's impairments (multi-system involvement)   OT goals addressed during session: ADL's and self-care      AM-PAC OT "6 Clicks" Daily Activity     Outcome Measure Help from another person eating meals?: A Little Help from another person taking care of personal grooming?: A Little Help from another person toileting, which includes using toliet, bedpan, or urinal?: A Lot Help from another person bathing (including washing, rinsing, drying)?: A Lot Help from another person to put on and taking off regular upper body clothing?: A Little Help from another person to put on and taking off regular lower body clothing?: A Lot 6 Click Score: 15   End of Session Nurse Communication: Other (  comment) (Nurse present during bed mobility)  Activity Tolerance: Patient limited by pain Patient left: in bed;with call bell/phone within reach  OT Visit Diagnosis: History of falling (Z91.81);Muscle weakness (generalized) (M62.81);Other abnormalities of gait and mobility (R26.89);Unsteadiness on feet (R26.81);Pain Pain - Right/Left: Right Pain - part of body: Leg;Ankle and joints of foot                Time: 3790-2409 OT Time Calculation (min): 36 min Charges:  OT General Charges $OT Visit: 1 Visit OT Evaluation $OT Eval Moderate Complexity: 1 Mod  Allanah Mcfarland OT, MOT  [employer] 06/30/2021, 10:45 AM

## 2021-07-01 DIAGNOSIS — F419 Anxiety disorder, unspecified: Secondary | ICD-10-CM | POA: Diagnosis present

## 2021-07-01 DIAGNOSIS — K754 Autoimmune hepatitis: Secondary | ICD-10-CM | POA: Diagnosis not present

## 2021-07-01 DIAGNOSIS — E669 Obesity, unspecified: Secondary | ICD-10-CM | POA: Diagnosis not present

## 2021-07-01 DIAGNOSIS — U071 COVID-19: Secondary | ICD-10-CM | POA: Diagnosis not present

## 2021-07-01 DIAGNOSIS — J449 Chronic obstructive pulmonary disease, unspecified: Secondary | ICD-10-CM | POA: Diagnosis not present

## 2021-07-01 DIAGNOSIS — I10 Essential (primary) hypertension: Secondary | ICD-10-CM | POA: Diagnosis not present

## 2021-07-01 DIAGNOSIS — E1165 Type 2 diabetes mellitus with hyperglycemia: Secondary | ICD-10-CM | POA: Diagnosis not present

## 2021-07-01 DIAGNOSIS — X501XXA Overexertion from prolonged static or awkward postures, initial encounter: Secondary | ICD-10-CM | POA: Diagnosis not present

## 2021-07-01 DIAGNOSIS — E1142 Type 2 diabetes mellitus with diabetic polyneuropathy: Secondary | ICD-10-CM | POA: Diagnosis not present

## 2021-07-01 DIAGNOSIS — Z794 Long term (current) use of insulin: Secondary | ICD-10-CM | POA: Diagnosis not present

## 2021-07-01 DIAGNOSIS — K219 Gastro-esophageal reflux disease without esophagitis: Secondary | ICD-10-CM | POA: Diagnosis present

## 2021-07-01 DIAGNOSIS — Z7952 Long term (current) use of systemic steroids: Secondary | ICD-10-CM | POA: Diagnosis not present

## 2021-07-01 DIAGNOSIS — I700 Atherosclerosis of aorta: Secondary | ICD-10-CM | POA: Diagnosis not present

## 2021-07-01 DIAGNOSIS — Z79899 Other long term (current) drug therapy: Secondary | ICD-10-CM | POA: Diagnosis not present

## 2021-07-01 DIAGNOSIS — K589 Irritable bowel syndrome without diarrhea: Secondary | ICD-10-CM | POA: Diagnosis present

## 2021-07-01 DIAGNOSIS — S82841A Displaced bimalleolar fracture of right lower leg, initial encounter for closed fracture: Secondary | ICD-10-CM | POA: Diagnosis not present

## 2021-07-01 DIAGNOSIS — E039 Hypothyroidism, unspecified: Secondary | ICD-10-CM | POA: Diagnosis not present

## 2021-07-01 DIAGNOSIS — Z7982 Long term (current) use of aspirin: Secondary | ICD-10-CM | POA: Diagnosis not present

## 2021-07-01 DIAGNOSIS — E785 Hyperlipidemia, unspecified: Secondary | ICD-10-CM | POA: Diagnosis present

## 2021-07-01 DIAGNOSIS — M25571 Pain in right ankle and joints of right foot: Secondary | ICD-10-CM | POA: Diagnosis present

## 2021-07-01 DIAGNOSIS — Z79890 Hormone replacement therapy (postmenopausal): Secondary | ICD-10-CM | POA: Diagnosis not present

## 2021-07-01 DIAGNOSIS — Z7984 Long term (current) use of oral hypoglycemic drugs: Secondary | ICD-10-CM | POA: Diagnosis not present

## 2021-07-01 DIAGNOSIS — I2510 Atherosclerotic heart disease of native coronary artery without angina pectoris: Secondary | ICD-10-CM

## 2021-07-01 DIAGNOSIS — H548 Legal blindness, as defined in USA: Secondary | ICD-10-CM

## 2021-07-01 DIAGNOSIS — G4700 Insomnia, unspecified: Secondary | ICD-10-CM | POA: Diagnosis present

## 2021-07-01 DIAGNOSIS — E876 Hypokalemia: Secondary | ICD-10-CM | POA: Diagnosis present

## 2021-07-01 DIAGNOSIS — E1140 Type 2 diabetes mellitus with diabetic neuropathy, unspecified: Secondary | ICD-10-CM | POA: Diagnosis not present

## 2021-07-01 LAB — COMPREHENSIVE METABOLIC PANEL
ALT: 23 U/L (ref 0–44)
AST: 28 U/L (ref 15–41)
Albumin: 3.2 g/dL — ABNORMAL LOW (ref 3.5–5.0)
Alkaline Phosphatase: 50 U/L (ref 38–126)
Anion gap: 11 (ref 5–15)
BUN: 10 mg/dL (ref 8–23)
CO2: 24 mmol/L (ref 22–32)
Calcium: 8.3 mg/dL — ABNORMAL LOW (ref 8.9–10.3)
Chloride: 103 mmol/L (ref 98–111)
Creatinine, Ser: 0.52 mg/dL (ref 0.44–1.00)
GFR, Estimated: >60 mL/min (ref >60)
Glucose, Bld: 91 mg/dL (ref 70–99)
Potassium: 3.3 mmol/L — ABNORMAL LOW (ref 3.5–5.1)
Sodium: 138 mmol/L (ref 135–145)
Total Bilirubin: 1 mg/dL (ref 0.3–1.2)
Total Protein: 7 g/dL (ref 6.5–8.1)

## 2021-07-01 LAB — GLUCOSE, CAPILLARY
Glucose-Capillary: 109 mg/dL — ABNORMAL HIGH (ref 70–99)
Glucose-Capillary: 167 mg/dL — ABNORMAL HIGH (ref 70–99)
Glucose-Capillary: 237 mg/dL — ABNORMAL HIGH (ref 70–99)
Glucose-Capillary: 199 mg/dL — ABNORMAL HIGH (ref 70–99)

## 2021-07-01 MED ORDER — POTASSIUM CHLORIDE CRYS ER 20 MEQ PO TBCR
40.0000 meq | EXTENDED_RELEASE_TABLET | ORAL | Status: AC
  Administered 2021-07-01 (×2): 40 meq via ORAL
  Filled 2021-07-01 (×2): qty 2

## 2021-07-01 NOTE — Assessment & Plan Note (Signed)
-  Continue treatment with Neurontin.

## 2021-07-01 NOTE — Assessment & Plan Note (Signed)
-  Continue continue metoprolol, ARB and aspirin -Patient denies chest pain. -Continue outpatient follow-up with cardiology service.

## 2021-07-01 NOTE — Assessment & Plan Note (Addendum)
-  Outpatient follow-up with orthopedic service for definitive treatment once swelling improved. -Currently anticipated surgical intervention on 07/09/21. -Nonweightbearing on her right foot -Physical therapy have seen patient with recommendations for home health; unfortunately patient will be discharge back to ALF with home health services. -TOC has been consulted.

## 2021-07-01 NOTE — Progress Notes (Signed)
Progress Note   Patient: [name] WSF:[id number] DOB: [birth year] DOA: 06/28/2021     1 DOS: the patient was seen and examined on 07/01/2021   Brief narrative course of admission: As per H&P written by Dr. Josephine Cables on 06/29/2021 [name] is a 76 y.o. female with medical history significant for COPD, hypertension, hyperlipidemia, hypothyroidism, CAD s/p stent placement, GERD, T2DM, legally blind and obesity who presents to the emergency department via EMS due to fall sustained in the evening yesterday prior to arrival to the ED.  Patient states that she was going to her bathroom when her legs suddenly gave up on her and she sustained a fall injuring her right ankle with difficulty in being able to bear weight on same leg, patient states that she did hit her head on the bathroom door when she fell.  However, she denies loss of consciousness, chest pain, shortness of breath, headache, nosebleed, cough, shortness of breath, nausea, vomiting, abdominal pain, numbness or tingling.   ED Course:  In the emergency department, she was bradycardic and BP was 115/58, but other vital signs were within normal range.  Work-up in the ED showed normal CBC, hypokalemia, hyperglycemia.  Influenza A, B was negative.  SARS coronavirus 2 was positive. Right ankle x-ray showed bimalleolar fracture dislocation Right knee x-ray was negative for fracture Right ankle x-ray showed interval tibiotalar reduction and bimalleolar fracture Patient was treated with morphine and Zofran.  Orthopedic surgeon (Dr. Amedeo Kinsman) was consulted and recommended admitting patient with plan to follow-up on patient in the morning.  Hospitalist was asked to admit patient for further evaluation and management.  Assessment and Plan: * Bimalleolar fracture of right ankle- (present on admission) - Outpatient follow-up with orthopedic service for definitive treatment once swelling improved. -Currently anticipated surgical intervention to  923. -Nonweightbearing on her right foot -Physical therapy have seen patient with recommendations for home health; unfortunately patient expressed lack of social support for safe discharge home. -TOC has been consulted.  COVID-19 virus infection -Currently afebrile -No infiltrates on chest x-ray or need for oxygen supplementation at this time. -Patient expressing intermittent coughing spells and short winded sensation. -Low-grade temperature on 06/29/2021; afebrile currently. -Short course of remdesivir along with supportive care/symptomatic management has been provided.  Day 2 out of 3 for remdesivir at this time.  Diabetic neuropathy ([place]) -Continue treatment with Neurontin.  Insomnia -Continue the use of amitriptyline and melatonin. -Discussed with patient importance of good sleep hygiene.  Hyperglycemia due to diabetes mellitus (HCC) -Continue the use of sliding scale insulin and Lantus -Modify carbohydrate diet discussed with patient -Follow CBGs and adjust hypoglycemic regimen as required.  Hypokalemia -Continue to follow electrolytes and replete as needed.  Autoimmune hepatitis ([place])- (present on admission) -Chronically receiving Imuran and prednisone -Continue outpatient follow-up with gastroenterology service. -Condition appears to be stable.  Gastroesophageal reflux disease- (present on admission) -Continue PPI.  Coronary artery disease -Continue continue metoprolol, ARB and aspirin -Patient denies chest pain. -Continue outpatient follow-up with cardiology service.  Essential hypertension- (present on admission) -Continue current antihypertensive regimen -Heart healthy diet discussed with patient.  BLINDNESS- (present on admission) - Continue supportive care.  Obesity, Class II, BMI 35-39.9 -Body mass index is 38.24 kg/m. -Low calorie diet, portion control and increase physical activity discussed with patient.  Hyperlipidemia, unspecified- (present on  admission) -Heart healthy diet discussed with patient. -Continue to follow lipid panel as an outpatient -Actively not using statins due to underlying history of autoimmune hepatitis.  Hypothyroidism- (present on  admission) -Continue Synthroid.    Subjective: Afebrile, no chest pain, no nausea, no vomiting.  Patient reports intermittent coughing spells and short winded sensation.  Also having intermittent right foot pain.  Physical Exam: Vitals:   06/30/21 2022 07/01/21 0440 07/01/21 1026 07/01/21 1532  BP: 129/71 (!) 139/57 (!) 142/62 (!) 154/69  Pulse: 83 84 86 87  Resp: 20 18 18 18   Temp: 98.2 F (36.8 C) 98.2 F (36.8 C) 99.5 F (37.5 C) 98.7 F (37.1 C)  TempSrc: Oral Oral Oral Oral  SpO2: 97% 96% 95% 94%  Weight:      Height:       General exam: Alert, awake, oriented x 3, blind At baseline, reports pain in her right foot.  Still short winded with activity but no requiring oxygen supplementation.  Reports intermittent coughing spells.  Afebrile Respiratory system: Good saturation on room air.  Respiratory effort normal.  No using accessory muscle.  Positive scattered rhonchi. Cardiovascular system:RRR. No murmurs, rubs, gallops.  Unable to properly assess JVD with body habitus. Gastrointestinal system: Abdomen is obese, nondistended, soft and nontender. No organomegaly or masses felt. Normal bowel sounds heard. Central nervous system: Alert and oriented. No focal neurological deficits. Extremities: No cyanosis or clubbing; right foot with ankle braces and dressing in place. Skin: No petechiae. Psychiatry: Judgement and insight appear normal. Mood & affect appropriate.    Data Reviewed: Stable renal function and electrolytes; no requiring oxygen supplementation currently.  Chest x-ray at the end of admission without acute infiltrates.  Family Communication: No family at bedside.  Disposition: Status is: Inpatient Remains inpatient appropriate because: Receiving  treatment for COVID infection and will require placement due to lack of safe discharge support.    Planned Discharge Destination: Skilled nursing facility  Author: Barton Dubois, MD 07/01/2021 6:08 PM  For on call review [email or web address].

## 2021-07-01 NOTE — Assessment & Plan Note (Signed)
-  Continue current antihypertensive regimen -Heart healthy diet discussed with patient.

## 2021-07-01 NOTE — Assessment & Plan Note (Signed)
Continue Synthroid °

## 2021-07-01 NOTE — Assessment & Plan Note (Addendum)
-  Appropriately repleted -Repeat basic metabolic panel to follow electrolytes trend and stability of follow-up visit. -Patient advised to maintain adequate nutrition/hydration.

## 2021-07-01 NOTE — Assessment & Plan Note (Signed)
-  Body mass index is 38.24 kg/m. -Low calorie diet, portion control and increase physical activity discussed with patient.

## 2021-07-01 NOTE — Assessment & Plan Note (Addendum)
-  Continue the use of amitriptyline and melatonin. -Discussed with patient importance of good sleep hygiene.

## 2021-07-01 NOTE — Assessment & Plan Note (Signed)
Continue supportive care

## 2021-07-01 NOTE — Assessment & Plan Note (Addendum)
-  Patient with type 2 diabetes mellitus chronically on insulin, with neuropathy and hyperglycemia.   -Resumed home insulin regimen and hypoglycemic therapy. -Modify carbohydrate diet discussed with patient. -Continue to follow CBGs and further adjust hypoglycemic regimen as required.

## 2021-07-01 NOTE — Assessment & Plan Note (Signed)
-  Chronically receiving Imuran and prednisone -Continue outpatient follow-up with gastroenterology service. -Condition appears to be stable.

## 2021-07-01 NOTE — Assessment & Plan Note (Addendum)
-  Currently afebrile, no nausea, no vomiting, no requiring oxygen supplementation and expressing just mild intermittent coughing spells. -No infiltrates on chest x-ray and not hypoxia. -Low-grade temperature on 06/29/2021; afebrile currently. -Short course of remdesivir along with supportive care/symptomatic management has been provided.  Patient completed a total of 3 days of remdesivir infusion. -At discharge continue as needed symptomatic management and follow the 3W's (wearing a mask, social distancing by waiting 6 feet apart and good frequent handwashing).

## 2021-07-01 NOTE — Assessment & Plan Note (Addendum)
-  Heart healthy diet discussed with patient. -Continue to follow lipid panel as an outpatient -Actively not using statins due to underlying history of autoimmune hepatitis.

## 2021-07-01 NOTE — Assessment & Plan Note (Signed)
Continue PPI ?

## 2021-07-01 NOTE — Progress Notes (Signed)
Inpatient Diabetes Program Recommendations  AACE/ADA: New Consensus Statement on Inpatient Glycemic Control (2015)  Target Ranges:  Prepandial:   less than 140 mg/dL      Peak postprandial:   less than 180 mg/dL (1-2 hours)      Critically ill patients:  140 - 180 mg/dL   Lab Results  Component Value Date   GLUCAP 109 (H) 07/01/2021   HGBA1C 7.7 (H) 06/29/2021    Review of Glycemic Control  Latest Reference Range & Units 06/29/21 16:21 06/29/21 22:47 06/30/21 07:34 06/30/21 11:03 06/30/21 16:05 06/30/21 20:23 07/01/21 07:53  Glucose-Capillary 70 - 99 mg/dL 135 (H) 123 (H) 115 (H) 160 (H) 262 (H) 173 (H) 109 (H)   Diabetes history: DM 2 Outpatient Diabetes medications:  Glucotrol 5 mg bid, Humalog 2-15 units tid with meals, Lantus 20 units bid Prednisone 20 mg daily Current orders for Inpatient glycemic control:  Prednisone 20 mg daily with breakfast Semglee 6 units q HS Novolog moderate tid with meals and HS  Inpatient Diabetes Program Recommendations:    While on steroids, consider adding Novolog meal coverage 3 units tid with meals (hold if patient eats less than 50% or NPO).   Thanks,  Adah Perl, RN, BC-ADM Inpatient Diabetes Coordinator Pager [phone]  (8a-5p)

## 2021-07-02 ENCOUNTER — Encounter (HOSPITAL_COMMUNITY): Payer: Self-pay | Admitting: Internal Medicine

## 2021-07-02 DIAGNOSIS — E119 Type 2 diabetes mellitus without complications: Secondary | ICD-10-CM

## 2021-07-02 DIAGNOSIS — Z794 Long term (current) use of insulin: Secondary | ICD-10-CM

## 2021-07-02 DIAGNOSIS — E1142 Type 2 diabetes mellitus with diabetic polyneuropathy: Secondary | ICD-10-CM

## 2021-07-02 LAB — GLUCOSE, CAPILLARY
Glucose-Capillary: 123 mg/dL — ABNORMAL HIGH (ref 70–99)
Glucose-Capillary: 208 mg/dL — ABNORMAL HIGH (ref 70–99)
Glucose-Capillary: 337 mg/dL — ABNORMAL HIGH (ref 70–99)
Glucose-Capillary: 137 mg/dL — ABNORMAL HIGH (ref 70–99)

## 2021-07-02 MED ORDER — GUAIFENESIN-DM 100-10 MG/5ML PO SYRP: 5.0000 mL | ORAL_SOLUTION | Freq: Four times a day (QID) | ORAL | 0 refills | Status: DC | PRN

## 2021-07-02 MED ORDER — METHOCARBAMOL 500 MG PO TABS: 500.0000 mg | ORAL_TABLET | Freq: Three times a day (TID) | ORAL | 0 refills | Status: DC | PRN

## 2021-07-02 MED ORDER — ALBUTEROL SULFATE HFA 108 (90 BASE) MCG/ACT IN AERS: 2.0000 | INHALATION_SPRAY | Freq: Four times a day (QID) | RESPIRATORY_TRACT | 2 refills | Status: DC | PRN

## 2021-07-02 MED ORDER — ZINC SULFATE 220 (50 ZN) MG PO CAPS: 220.0000 mg | ORAL_CAPSULE | Freq: Every day | ORAL | 1 refills | Status: DC

## 2021-07-02 MED ORDER — ASCORBIC ACID 500 MG PO TABS: 500.0000 mg | ORAL_TABLET | Freq: Every day | ORAL | 1 refills | Status: DC

## 2021-07-02 MED ORDER — TRAMADOL HCL 50 MG PO TABS: 50.0000 mg | ORAL_TABLET | Freq: Three times a day (TID) | ORAL | 0 refills | Status: DC | PRN

## 2021-07-02 NOTE — NC FL2 (Signed)
[place] LEVEL OF CARE SCREENING TOOL     IDENTIFICATION  Patient Name: [name] Birthdate: [birth year] Sex: female Admission Date (Current Location): 06/28/2021  [facility] and [place] Number:  [employer] and Address:  [place] [address] [place]      Provider Number: [id number]  Attending Physician Name and Address:  Barton Dubois, MD  Relative Name and Phone Number:       Current Level of Care: Hospital Recommended Level of Care: Assisted Living Facility Prior Approval Number:    Date Approved/Denied:   PASRR Number:    Discharge Plan: Other (Comment) (ALF)    Current Diagnoses: Patient Active Problem List   Diagnosis Date Noted   Bimalleolar fracture of right ankle 06/29/2021   Hypokalemia 06/29/2021   Hyperglycemia due to diabetes mellitus ([place]) 06/29/2021   Insomnia 06/29/2021   Diabetic neuropathy ([place]) 06/29/2021   COVID-19 virus infection 06/29/2021   Autoimmune hepatitis ([place]) 01/20/2021   Elevated LFTs 12/11/2020   NASH (nonalcoholic steatohepatitis) 12/11/2020   Acute lower UTI 06/18/2018   Elevated troponin 06/18/2018   UTI (urinary tract infection) 06/18/2018   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 breast ([place]) 06/21/2017   Mastalgia 34/74/2595   Acute diastolic CHF (congestive heart failure) ([place]) 12/20/2016   Adrenal insufficiency ([place]) 12/18/2016   Lobar pneumonia ([place]) 63/87/5643   Acute metabolic encephalopathy 32/95/1884   Thrombocytopenia ([place]) 12/15/2016   CKD (chronic kidney disease) stage 3, GFR 30-59 ml/min (HCC) 12/15/2016   Sepsis ([place]) 12/14/2016   COPD exacerbation ([place]) 04/24/2016   DM type 2 (diabetes mellitus, type 2) ([place]) 04/24/2016   Aortic atherosclerosis ([place]) 04/24/2016   Transaminitis 10/20/2014   Acute bronchitis 10/20/2014   Acute respiratory failure ([place]) 10/17/2014   IBS (irritable bowel syndrome) 08/07/2012   RLQ abdominal pain 10/05/2011   Coronary  artery disease    Tobacco abuse, in remission    Gastroesophageal reflux disease    Obesity, Class II, BMI 35-39.9 04/01/2010   BLINDNESS 04/01/2010   Hypothyroidism 09/29/2009   Hyperlipidemia, unspecified 09/29/2009   Essential hypertension 09/29/2009    Orientation RESPIRATION BLADDER Height & Weight     Self, Time, Situation, Place  Normal Continent Weight: 202 lb 6.1 oz (91.8 kg) Height:  5\' 1"  (154.9 cm)  BEHAVIORAL SYMPTOMS/MOOD NEUROLOGICAL BOWEL NUTRITION STATUS      Continent Diet (no concentrated sweets)  AMBULATORY STATUS COMMUNICATION OF NEEDS Skin   Extensive Assist Verbally Normal                       Personal Care Assistance Level of Assistance  Bathing, Feeding, Dressing Bathing Assistance: Maximum assistance Feeding assistance: Independent Dressing Assistance: Maximum assistance     Functional Limitations Info  Sight, Hearing, Speech Sight Info: Impaired Hearing Info: Adequate Speech Info: Adequate    SPECIAL CARE FACTORS FREQUENCY  PT (By licensed PT), OT (By licensed OT)     PT Frequency: 3x/week OT Frequency: 3x/week            Contractures Contractures Info: Not present    Additional Factors Info  Code Status, Allergies Code Status Info: Full Allergies Info: Codeine           Current Medications (07/02/2021):  This is the current hospital active medication list Current Facility-Administered Medications  Medication Dose Route Frequency Provider Last Rate Last Admin   acetaminophen (TYLENOL) tablet 650 mg  650 mg Oral Q6H PRN Geradine Girt, DO  650 mg at 06/30/21 0347   amitriptyline (ELAVIL) tablet 50 mg  50 mg Oral QHS Eulogio Bear U, DO   50 mg at 07/01/21 2222   ascorbic acid (VITAMIN C) tablet 500 mg  500 mg Oral Daily Adefeso, Oladapo, DO   500 mg at 07/02/21 2440   aspirin chewable tablet 81 mg  81 mg Oral QHS Adefeso, Oladapo, DO   81 mg at 07/01/21 2222   Chlorhexidine Gluconate Cloth 2 % PADS 6 each  6 each Topical  Daily Eulogio Bear U, DO   6 each at 07/02/21 1133   gabapentin (NEURONTIN) capsule 500 mg  500 mg Oral TID Bernadette Hoit, DO   500 mg at 07/02/21 0857   insulin aspart (novoLOG) injection 0-15 Units  0-15 Units Subcutaneous TID WC Adefeso, Oladapo, DO   5 Units at 07/02/21 1244   insulin aspart (novoLOG) injection 0-5 Units  0-5 Units Subcutaneous QHS Adefeso, Oladapo, DO       insulin glargine-yfgn (SEMGLEE) injection 6 Units  6 Units Subcutaneous QHS Adefeso, Oladapo, DO   6 Units at 07/01/21 2223   levothyroxine (SYNTHROID) tablet 150 mcg  150 mcg Oral Q0600 Adefeso, Oladapo, DO   150 mcg at 07/02/21 0603   lidocaine (LIDODERM) 5 % 1 patch  1 patch Transdermal Daily Eulogio Bear U, DO   1 patch at 07/02/21 0859   magnesium oxide (MAG-OX) tablet 400 mg  400 mg Oral Daily Eulogio Bear U, DO   400 mg at 07/02/21 0901   melatonin tablet 6 mg  6 mg Oral QHS Adefeso, Oladapo, DO   6 mg at 07/01/21 2222   methocarbamol (ROBAXIN) tablet 500 mg  500 mg Oral Q6H PRN Adefeso, Oladapo, DO   500 mg at 07/02/21 1133   Or   methocarbamol (ROBAXIN) 500 mg in dextrose 5 % 50 mL IVPB  500 mg Intravenous Q6H PRN Adefeso, Oladapo, DO       metoprolol succinate (TOPROL-XL) 24 hr tablet 25 mg  25 mg Oral Daily Vann, Jessica U, DO   25 mg at 07/02/21 0859   morphine 2 MG/ML injection 2 mg  2 mg Intravenous Q4H PRN Eulogio Bear U, DO   2 mg at 07/02/21 1132   mupirocin ointment (BACTROBAN) 2 % 1 application  1 application Nasal BID Eulogio Bear U, DO   1 application at 03/26/24 0901   pantoprazole (PROTONIX) EC tablet 40 mg  40 mg Oral BID Adefeso, Oladapo, DO   40 mg at 07/02/21 0859   phenylephrine-shark liver oil-mineral oil-petrolatum (PREPARATION H) rectal ointment 1 application  1 application Rectal D6U PRN Eulogio Bear U, DO       polyethylene glycol (MIRALAX / GLYCOLAX) packet 17 g  17 g Oral Daily PRN Adefeso, Oladapo, DO       predniSONE (DELTASONE) tablet 20 mg  20 mg Oral Q breakfast Vann, Jessica  U, DO   20 mg at 07/02/21 4403   remdesivir 100 mg in sodium chloride 0.9 % 100 mL IVPB  100 mg Intravenous Daily Eulogio Bear U, DO 200 mL/hr at 07/02/21 1142 100 mg at 07/02/21 1142   sertraline (ZOLOFT) tablet 25 mg  25 mg Oral Daily Eulogio Bear U, DO   25 mg at 07/02/21 0858   traMADol (ULTRAM) tablet 50 mg  50 mg Oral Q6H PRN Eulogio Bear U, DO   50 mg at 06/30/21 2147   zinc sulfate capsule 220 mg  220 mg Oral Daily Adefeso, Oladapo, DO  220 mg at 07/02/21 0858     Discharge Medications: TAKE these medications     acetaminophen 325 MG tablet Commonly known as: TYLENOL Take 650 mg by mouth every 6 (six) hours as needed for moderate pain.    albuterol 108 (90 Base) MCG/ACT inhaler Commonly known as: VENTOLIN HFA Inhale 2 puffs into the lungs every 6 (six) hours as needed for wheezing or shortness of breath.    amitriptyline 50 MG tablet Commonly known as: ELAVIL Take 50 mg by mouth at bedtime.    ascorbic acid 500 MG tablet Commonly known as: VITAMIN C Take 1 tablet (500 mg total) by mouth daily. Start taking on: July 03, 2021    aspirin 81 MG tablet Take 81 mg by mouth at bedtime.    azaTHIOprine 50 MG tablet Commonly known as: IMURAN Take 1 tablet (50 mg total) by mouth daily.    calcium carbonate 500 MG chewable tablet Commonly known as: TUMS - dosed in mg elemental calcium Chew 1,000 mg by mouth every 8 (eight) hours as needed for heartburn.    Cholecalciferol 50 MCG (2000 UT) Tabs Take 2,000 Units by mouth daily at 6 (six) AM.    gabapentin 100 MG capsule Commonly known as: NEURONTIN Take 500 mg by mouth 3 (three) times daily.    glipiZIDE 5 MG tablet Commonly known as: GLUCOTROL Take 5 mg by mouth 2 (two) times daily.    guaiFENesin-dextromethorphan 100-10 MG/5ML syrup Commonly known as: ROBITUSSIN DM Take 5 mLs by mouth every 6 (six) hours as needed for cough.    Hemorrhoidal 0.25-14-71.9 % Oint Generic drug: Phenylephrine-Mineral  Oil-Pet Place 1 application rectally every 6 (six) hours as needed (hemorrhoid discomfort).    insulin lispro 100 UNIT/ML KwikPen Commonly known as: HUMALOG Inject 2-15 Units into the skin 3 (three) times daily before meals. 121-150=2 units, 151-200= 3 units, 201-250 = 5 units, 251-300= 8 units, 301-400 = 15 units    Lantus SoloStar 100 UNIT/ML Solostar Pen Generic drug: insulin glargine Inject 20 Units into the skin 2 (two) times daily. Prime pen with 2u prior to each use    levothyroxine 150 MCG tablet Commonly known as: SYNTHROID Take 137 mcg by mouth in the morning.    lidocaine 5 % Commonly known as: LIDODERM Place 1 patch onto the skin daily.    losartan 100 MG tablet Commonly known as: COZAAR Take 100 mg by mouth at bedtime.    magnesium oxide 400 MG tablet Commonly known as: MAG-OX Take 400 mg by mouth daily.    Melatonin 5 MG Caps Take 5 mg by mouth at bedtime.    methocarbamol 500 MG tablet Commonly known as: ROBAXIN Take 1 tablet (500 mg total) by mouth every 8 (eight) hours as needed for muscle spasms.    metoprolol succinate 25 MG 24 hr tablet Commonly known as: Toprol XL Take 1 tablet (25 mg total) by mouth daily.    pantoprazole 40 MG tablet Commonly known as: PROTONIX Take 40 mg by mouth 2 (two) times daily.    predniSONE 5 MG tablet Commonly known as: DELTASONE Take 20 mg by mouth daily with breakfast.    sertraline 25 MG tablet Commonly known as: ZOLOFT Take 25 mg by mouth daily.    traMADol 50 MG tablet Commonly known as: ULTRAM Take 1 tablet (50 mg total) by mouth every 8 (eight) hours as needed for severe pain. What changed:  when to take this reasons to take this    zinc  sulfate 220 (50 Zn) MG capsule Take 1 capsule (220 mg total) by mouth daily. Start taking on: July 03, 2021     Relevant Imaging Results:  Relevant Lab Results:   Additional Information    Rosita Guzzetta, Clydene Pugh, LCSW
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
## 2021-07-02 NOTE — TOC Transition Note (Signed)
Transition of Care [facility]) - CM/SW Discharge Note   Patient Details  Name: STARNISHA BATREZ MRN: [id number] Date of Birth: 18-Jan-1946  [facility] CM/SW Contact:  Ihor Gully, LCSW Phone Number: 07/02/2021, 4:13 PM   Clinical Narrative:    Juliann Pulse at facility notified of d/c and discharge clinicals sent to facility. Message left for daughter, Abigail Butts regarding d/c. EMS contacted and transportation arranged.  TOC signing off.     Barriers to Discharge: Continued Medical Work up   Patient Goals and CMS Choice Patient states their goals for this hospitalization and ongoing recovery are:: get better      Discharge Placement                       Discharge Plan and Services In-house Referral: Clinical Social Work                                   Social Determinants of Health (SDOH) Interventions     Readmission Risk Interventions No flowsheet data found.

## 2021-07-03 DIAGNOSIS — E119 Type 2 diabetes mellitus without complications: Secondary | ICD-10-CM | POA: Diagnosis not present

## 2021-07-03 DIAGNOSIS — E038 Other specified hypothyroidism: Secondary | ICD-10-CM | POA: Diagnosis not present

## 2021-07-03 DIAGNOSIS — I10 Essential (primary) hypertension: Secondary | ICD-10-CM | POA: Diagnosis not present

## 2021-07-03 DIAGNOSIS — E1165 Type 2 diabetes mellitus with hyperglycemia: Secondary | ICD-10-CM | POA: Diagnosis not present

## 2021-07-03 DIAGNOSIS — D518 Other vitamin B12 deficiency anemias: Secondary | ICD-10-CM | POA: Diagnosis not present

## 2021-07-03 DIAGNOSIS — J449 Chronic obstructive pulmonary disease, unspecified: Secondary | ICD-10-CM | POA: Diagnosis not present

## 2021-07-03 DIAGNOSIS — E1100 Type 2 diabetes mellitus with hyperosmolarity without nonketotic hyperglycemic-hyperosmolar coma (NKHHC): Secondary | ICD-10-CM | POA: Diagnosis not present

## 2021-07-03 DIAGNOSIS — E559 Vitamin D deficiency, unspecified: Secondary | ICD-10-CM | POA: Diagnosis not present

## 2021-07-03 DIAGNOSIS — I2510 Atherosclerotic heart disease of native coronary artery without angina pectoris: Secondary | ICD-10-CM | POA: Diagnosis not present

## 2021-07-03 DIAGNOSIS — E785 Hyperlipidemia, unspecified: Secondary | ICD-10-CM | POA: Diagnosis not present

## 2021-07-03 DIAGNOSIS — I5030 Unspecified diastolic (congestive) heart failure: Secondary | ICD-10-CM | POA: Diagnosis not present

## 2021-07-06 ENCOUNTER — Telehealth: Payer: Self-pay | Admitting: Orthopedic Surgery

## 2021-07-06 DIAGNOSIS — E876 Hypokalemia: Secondary | ICD-10-CM | POA: Diagnosis not present

## 2021-07-06 DIAGNOSIS — W19XXXD Unspecified fall, subsequent encounter: Secondary | ICD-10-CM | POA: Diagnosis not present

## 2021-07-06 DIAGNOSIS — E1140 Type 2 diabetes mellitus with diabetic neuropathy, unspecified: Secondary | ICD-10-CM | POA: Diagnosis not present

## 2021-07-06 DIAGNOSIS — U071 COVID-19: Secondary | ICD-10-CM | POA: Diagnosis not present

## 2021-07-06 DIAGNOSIS — S82841D Displaced bimalleolar fracture of right lower leg, subsequent encounter for closed fracture with routine healing: Secondary | ICD-10-CM | POA: Diagnosis not present

## 2021-07-06 DIAGNOSIS — I2510 Atherosclerotic heart disease of native coronary artery without angina pectoris: Secondary | ICD-10-CM | POA: Diagnosis not present

## 2021-07-06 DIAGNOSIS — J449 Chronic obstructive pulmonary disease, unspecified: Secondary | ICD-10-CM | POA: Diagnosis not present

## 2021-07-06 DIAGNOSIS — I10 Essential (primary) hypertension: Secondary | ICD-10-CM | POA: Diagnosis not present

## 2021-07-06 DIAGNOSIS — H547 Unspecified visual loss: Secondary | ICD-10-CM | POA: Diagnosis not present

## 2021-07-06 DIAGNOSIS — E1165 Type 2 diabetes mellitus with hyperglycemia: Secondary | ICD-10-CM | POA: Diagnosis not present

## 2021-07-06 NOTE — Telephone Encounter (Signed)
Call received from [place] at [facility] facility, ph# [phone], relaying that she called to scheduled appointment for office visit to be set up for 7 days after surgery,  which is scheduled 07/09/21 for right ankle. Post op appointment scheduled; aware, and questions came up about surgery. Please contact at this same facility phone number, and ask for [name].

## 2021-07-07 DIAGNOSIS — S82891A Other fracture of right lower leg, initial encounter for closed fracture: Secondary | ICD-10-CM | POA: Diagnosis not present

## 2021-07-07 DIAGNOSIS — K589 Irritable bowel syndrome without diarrhea: Secondary | ICD-10-CM | POA: Diagnosis not present

## 2021-07-07 DIAGNOSIS — D649 Anemia, unspecified: Secondary | ICD-10-CM | POA: Diagnosis not present

## 2021-07-07 DIAGNOSIS — I2510 Atherosclerotic heart disease of native coronary artery without angina pectoris: Secondary | ICD-10-CM | POA: Diagnosis not present

## 2021-07-07 DIAGNOSIS — E119 Type 2 diabetes mellitus without complications: Secondary | ICD-10-CM | POA: Diagnosis not present

## 2021-07-07 DIAGNOSIS — I10 Essential (primary) hypertension: Secondary | ICD-10-CM | POA: Diagnosis not present

## 2021-07-07 DIAGNOSIS — K219 Gastro-esophageal reflux disease without esophagitis: Secondary | ICD-10-CM | POA: Diagnosis not present

## 2021-07-07 DIAGNOSIS — E559 Vitamin D deficiency, unspecified: Secondary | ICD-10-CM | POA: Diagnosis not present

## 2021-07-07 DIAGNOSIS — H547 Unspecified visual loss: Secondary | ICD-10-CM | POA: Diagnosis not present

## 2021-07-07 DIAGNOSIS — E785 Hyperlipidemia, unspecified: Secondary | ICD-10-CM | POA: Diagnosis not present

## 2021-07-07 DIAGNOSIS — E039 Hypothyroidism, unspecified: Secondary | ICD-10-CM | POA: Diagnosis not present

## 2021-07-07 NOTE — Telephone Encounter (Signed)
Attempted to return call, no answer no VM

## 2021-07-08 ENCOUNTER — Encounter (HOSPITAL_COMMUNITY)
Admission: RE | Admit: 2021-07-08 | Discharge: 2021-07-08 | Disposition: A | Discharge status: Home / Self Care | Payer: Medicare Other | Source: Ambulatory Visit | Attending: Orthopedic Surgery | Admitting: Orthopedic Surgery

## 2021-07-08 DIAGNOSIS — J449 Chronic obstructive pulmonary disease, unspecified: Secondary | ICD-10-CM | POA: Diagnosis not present

## 2021-07-08 DIAGNOSIS — H547 Unspecified visual loss: Secondary | ICD-10-CM | POA: Diagnosis not present

## 2021-07-08 DIAGNOSIS — U071 COVID-19: Secondary | ICD-10-CM | POA: Diagnosis not present

## 2021-07-08 DIAGNOSIS — I10 Essential (primary) hypertension: Secondary | ICD-10-CM | POA: Diagnosis not present

## 2021-07-08 DIAGNOSIS — E1140 Type 2 diabetes mellitus with diabetic neuropathy, unspecified: Secondary | ICD-10-CM | POA: Diagnosis not present

## 2021-07-08 DIAGNOSIS — E1165 Type 2 diabetes mellitus with hyperglycemia: Secondary | ICD-10-CM | POA: Diagnosis not present

## 2021-07-08 DIAGNOSIS — S82841D Displaced bimalleolar fracture of right lower leg, subsequent encounter for closed fracture with routine healing: Secondary | ICD-10-CM | POA: Diagnosis not present

## 2021-07-08 DIAGNOSIS — E876 Hypokalemia: Secondary | ICD-10-CM | POA: Diagnosis not present

## 2021-07-08 DIAGNOSIS — I2510 Atherosclerotic heart disease of native coronary artery without angina pectoris: Secondary | ICD-10-CM | POA: Diagnosis not present

## 2021-07-08 DIAGNOSIS — W19XXXD Unspecified fall, subsequent encounter: Secondary | ICD-10-CM | POA: Diagnosis not present

## 2021-07-09 ENCOUNTER — Ambulatory Visit (HOSPITAL_COMMUNITY)
Admission: RE | Admit: 2021-07-09 | Discharge: 2021-07-09 | Disposition: A | Discharge status: Home / Self Care | Payer: Medicare Other | Attending: Orthopedic Surgery | Admitting: Orthopedic Surgery

## 2021-07-09 ENCOUNTER — Encounter (HOSPITAL_COMMUNITY)
Admission: RE | Disposition: A | Discharge status: Home / Self Care | Payer: Self-pay | Source: Home / Self Care | Attending: Orthopedic Surgery

## 2021-07-09 ENCOUNTER — Ambulatory Visit (HOSPITAL_COMMUNITY): Payer: Medicare Other | Admitting: Certified Registered"

## 2021-07-09 ENCOUNTER — Ambulatory Visit (HOSPITAL_COMMUNITY): Payer: Medicare Other

## 2021-07-09 ENCOUNTER — Encounter (HOSPITAL_COMMUNITY): Payer: Self-pay | Admitting: Orthopedic Surgery

## 2021-07-09 ENCOUNTER — Other Ambulatory Visit: Payer: Self-pay

## 2021-07-09 ENCOUNTER — Ambulatory Visit (HOSPITAL_BASED_OUTPATIENT_CLINIC_OR_DEPARTMENT_OTHER): Payer: Medicare Other | Admitting: Certified Registered"

## 2021-07-09 DIAGNOSIS — I110 Hypertensive heart disease with heart failure: Secondary | ICD-10-CM | POA: Diagnosis not present

## 2021-07-09 DIAGNOSIS — J449 Chronic obstructive pulmonary disease, unspecified: Secondary | ICD-10-CM

## 2021-07-09 DIAGNOSIS — I509 Heart failure, unspecified: Secondary | ICD-10-CM | POA: Diagnosis not present

## 2021-07-09 DIAGNOSIS — Z79890 Hormone replacement therapy (postmenopausal): Secondary | ICD-10-CM | POA: Insufficient documentation

## 2021-07-09 DIAGNOSIS — I2510 Atherosclerotic heart disease of native coronary artery without angina pectoris: Secondary | ICD-10-CM | POA: Insufficient documentation

## 2021-07-09 DIAGNOSIS — Z6838 Body mass index (BMI) 38.0-38.9, adult: Secondary | ICD-10-CM | POA: Insufficient documentation

## 2021-07-09 DIAGNOSIS — E669 Obesity, unspecified: Secondary | ICD-10-CM | POA: Insufficient documentation

## 2021-07-09 DIAGNOSIS — E785 Hyperlipidemia, unspecified: Secondary | ICD-10-CM | POA: Insufficient documentation

## 2021-07-09 DIAGNOSIS — K219 Gastro-esophageal reflux disease without esophagitis: Secondary | ICD-10-CM | POA: Diagnosis not present

## 2021-07-09 DIAGNOSIS — W1830XA Fall on same level, unspecified, initial encounter: Secondary | ICD-10-CM | POA: Insufficient documentation

## 2021-07-09 DIAGNOSIS — Z794 Long term (current) use of insulin: Secondary | ICD-10-CM | POA: Insufficient documentation

## 2021-07-09 DIAGNOSIS — E119 Type 2 diabetes mellitus without complications: Secondary | ICD-10-CM | POA: Diagnosis not present

## 2021-07-09 DIAGNOSIS — E039 Hypothyroidism, unspecified: Secondary | ICD-10-CM | POA: Insufficient documentation

## 2021-07-09 DIAGNOSIS — S82391A Other fracture of lower end of right tibia, initial encounter for closed fracture: Secondary | ICD-10-CM | POA: Diagnosis not present

## 2021-07-09 DIAGNOSIS — F419 Anxiety disorder, unspecified: Secondary | ICD-10-CM

## 2021-07-09 DIAGNOSIS — S82891A Other fracture of right lower leg, initial encounter for closed fracture: Secondary | ICD-10-CM

## 2021-07-09 DIAGNOSIS — Z955 Presence of coronary angioplasty implant and graft: Secondary | ICD-10-CM | POA: Insufficient documentation

## 2021-07-09 DIAGNOSIS — S82841A Displaced bimalleolar fracture of right lower leg, initial encounter for closed fracture: Secondary | ICD-10-CM

## 2021-07-09 DIAGNOSIS — Y92099 Unspecified place in other non-institutional residence as the place of occurrence of the external cause: Secondary | ICD-10-CM | POA: Diagnosis not present

## 2021-07-09 DIAGNOSIS — Y9389 Activity, other specified: Secondary | ICD-10-CM | POA: Insufficient documentation

## 2021-07-09 DIAGNOSIS — S82891D Other fracture of right lower leg, subsequent encounter for closed fracture with routine healing: Secondary | ICD-10-CM | POA: Diagnosis not present

## 2021-07-09 DIAGNOSIS — Z7952 Long term (current) use of systemic steroids: Secondary | ICD-10-CM | POA: Insufficient documentation

## 2021-07-09 DIAGNOSIS — Z87891 Personal history of nicotine dependence: Secondary | ICD-10-CM | POA: Diagnosis not present

## 2021-07-09 DIAGNOSIS — U071 COVID-19: Secondary | ICD-10-CM | POA: Diagnosis not present

## 2021-07-09 DIAGNOSIS — Z7984 Long term (current) use of oral hypoglycemic drugs: Secondary | ICD-10-CM | POA: Diagnosis not present

## 2021-07-09 DIAGNOSIS — Z01818 Encounter for other preprocedural examination: Secondary | ICD-10-CM

## 2021-07-09 DIAGNOSIS — S82831A Other fracture of upper and lower end of right fibula, initial encounter for closed fracture: Secondary | ICD-10-CM | POA: Diagnosis not present

## 2021-07-09 HISTORY — PX: ORIF ANKLE FRACTURE: SHX5408

## 2021-07-09 LAB — GLUCOSE, CAPILLARY
Glucose-Capillary: 84 mg/dL (ref 70–99)
Glucose-Capillary: 81 mg/dL (ref 70–99)

## 2021-07-09 SURGERY — OPEN REDUCTION INTERNAL FIXATION (ORIF) ANKLE FRACTURE
Anesthesia: General | Site: Ankle | Laterality: Right

## 2021-07-09 MED ORDER — LACTATED RINGERS IV SOLN
INTRAVENOUS | Status: DC
  Administered 2021-07-09: 1000 mL via INTRAVENOUS

## 2021-07-09 MED ORDER — SEVOFLURANE IN SOLN
RESPIRATORY_TRACT | Status: AC
  Filled 2021-07-09: qty 250

## 2021-07-09 MED ORDER — FENTANYL CITRATE PF 50 MCG/ML IJ SOSY
25.0000 ug | PREFILLED_SYRINGE | INTRAMUSCULAR | Status: DC | PRN
  Administered 2021-07-09: 50 ug via INTRAVENOUS
  Filled 2021-07-09: qty 1

## 2021-07-09 MED ORDER — DEXAMETHASONE SODIUM PHOSPHATE 10 MG/ML IJ SOLN
INTRAMUSCULAR | Status: AC
  Filled 2021-07-09: qty 1

## 2021-07-09 MED ORDER — KETOROLAC TROMETHAMINE 30 MG/ML IJ SOLN
INTRAMUSCULAR | Status: AC
  Filled 2021-07-09: qty 1

## 2021-07-09 MED ORDER — DEXAMETHASONE SODIUM PHOSPHATE 4 MG/ML IJ SOLN
INTRAMUSCULAR | Status: DC | PRN
  Administered 2021-07-09: 5 mg via INTRAVENOUS

## 2021-07-09 MED ORDER — CEFAZOLIN SODIUM-DEXTROSE 2-4 GM/100ML-% IV SOLN
INTRAVENOUS | Status: AC
  Filled 2021-07-09: qty 100

## 2021-07-09 MED ORDER — LIDOCAINE HCL (PF) 2 % IJ SOLN
INTRAMUSCULAR | Status: AC
  Filled 2021-07-09: qty 5

## 2021-07-09 MED ORDER — ONDANSETRON HCL 4 MG/2ML IJ SOLN
INTRAMUSCULAR | Status: AC
  Filled 2021-07-09: qty 2

## 2021-07-09 MED ORDER — ACETAMINOPHEN 500 MG PO TABS: 1000.0000 mg | ORAL_TABLET | Freq: Three times a day (TID) | ORAL | 0 refills | Status: AC

## 2021-07-09 MED ORDER — CEFAZOLIN SODIUM-DEXTROSE 2-4 GM/100ML-% IV SOLN
2.0000 g | INTRAVENOUS | Status: AC
  Administered 2021-07-09: 2 g via INTRAVENOUS

## 2021-07-09 MED ORDER — EPHEDRINE SULFATE-NACL 50-0.9 MG/10ML-% IV SOSY
PREFILLED_SYRINGE | INTRAVENOUS | Status: DC | PRN
  Administered 2021-07-09: 5 mg via INTRAVENOUS

## 2021-07-09 MED ORDER — PROPOFOL 10 MG/ML IV BOLUS
INTRAVENOUS | Status: DC | PRN
  Administered 2021-07-09: 130 mg via INTRAVENOUS

## 2021-07-09 MED ORDER — 0.9 % SODIUM CHLORIDE (POUR BTL) OPTIME
TOPICAL | Status: DC | PRN
  Administered 2021-07-09: 1000 mL

## 2021-07-09 MED ORDER — PROPOFOL 10 MG/ML IV BOLUS
INTRAVENOUS | Status: AC
  Filled 2021-07-09: qty 20

## 2021-07-09 MED ORDER — ROCURONIUM BROMIDE 10 MG/ML (PF) SYRINGE
PREFILLED_SYRINGE | INTRAVENOUS | Status: AC
  Filled 2021-07-09: qty 10

## 2021-07-09 MED ORDER — CHLORHEXIDINE GLUCONATE 0.12 % MT SOLN: 15.0000 mL | Freq: Once | OROMUCOSAL | Status: AC

## 2021-07-09 MED ORDER — BUPIVACAINE-EPINEPHRINE (PF) 0.5% -1:200000 IJ SOLN
INTRAMUSCULAR | Status: AC
  Filled 2021-07-09: qty 30

## 2021-07-09 MED ORDER — FENTANYL CITRATE (PF) 250 MCG/5ML IJ SOLN
INTRAMUSCULAR | Status: DC | PRN
  Administered 2021-07-09 (×5): 50 ug via INTRAVENOUS

## 2021-07-09 MED ORDER — CHLORHEXIDINE GLUCONATE 0.12 % MT SOLN
OROMUCOSAL | Status: AC
  Administered 2021-07-09: 15 mL via OROMUCOSAL
  Filled 2021-07-09: qty 15

## 2021-07-09 MED ORDER — ONDANSETRON HCL 4 MG PO TABS: 4.0000 mg | ORAL_TABLET | Freq: Three times a day (TID) | ORAL | 0 refills | Status: AC | PRN

## 2021-07-09 MED ORDER — ONDANSETRON HCL 4 MG/2ML IJ SOLN: 4.0000 mg | Freq: Once | INTRAMUSCULAR | Status: DC | PRN

## 2021-07-09 MED ORDER — PHENYLEPHRINE 40 MCG/ML (10ML) SYRINGE FOR IV PUSH (FOR BLOOD PRESSURE SUPPORT)
PREFILLED_SYRINGE | INTRAVENOUS | Status: AC
  Filled 2021-07-09: qty 10

## 2021-07-09 MED ORDER — ORAL CARE MOUTH RINSE: 15.0000 mL | Freq: Once | OROMUCOSAL | Status: AC

## 2021-07-09 MED ORDER — VANCOMYCIN HCL 1000 MG IV SOLR
INTRAVENOUS | Status: AC
  Filled 2021-07-09: qty 20

## 2021-07-09 MED ORDER — LIDOCAINE 2% (20 MG/ML) 5 ML SYRINGE
INTRAMUSCULAR | Status: DC | PRN
  Administered 2021-07-09: 100 mg via INTRAVENOUS

## 2021-07-09 MED ORDER — KETOROLAC TROMETHAMINE 30 MG/ML IJ SOLN
INTRAMUSCULAR | Status: DC | PRN
Start: 2021-07-09 — End: 2021-07-09
  Administered 2021-07-09: 30 mg via INTRAVENOUS

## 2021-07-09 MED ORDER — PHENYLEPHRINE 40 MCG/ML (10ML) SYRINGE FOR IV PUSH (FOR BLOOD PRESSURE SUPPORT)
PREFILLED_SYRINGE | INTRAVENOUS | Status: DC | PRN
Start: 2021-07-09 — End: 2021-07-09
  Administered 2021-07-09: 80 ug via INTRAVENOUS

## 2021-07-09 MED ORDER — ROCURONIUM BROMIDE 10 MG/ML (PF) SYRINGE
PREFILLED_SYRINGE | INTRAVENOUS | Status: DC | PRN
Start: 2021-07-09 — End: 2021-07-09
  Administered 2021-07-09: 50 mg via INTRAVENOUS

## 2021-07-09 MED ORDER — ONDANSETRON HCL 4 MG/2ML IJ SOLN
INTRAMUSCULAR | Status: DC | PRN
Start: 2021-07-09 — End: 2021-07-09
  Administered 2021-07-09: 4 mg via INTRAVENOUS

## 2021-07-09 MED ORDER — CELECOXIB 100 MG PO CAPS: 100.0000 mg | ORAL_CAPSULE | Freq: Every day | ORAL | 0 refills | Status: AC

## 2021-07-09 MED ORDER — EPHEDRINE 5 MG/ML INJ
INTRAVENOUS | Status: AC
  Filled 2021-07-09: qty 5

## 2021-07-09 MED ORDER — SUGAMMADEX SODIUM 200 MG/2ML IV SOLN
INTRAVENOUS | Status: DC | PRN
Start: 2021-07-09 — End: 2021-07-09
  Administered 2021-07-09: 200 mg via INTRAVENOUS

## 2021-07-09 MED ORDER — BUPIVACAINE-EPINEPHRINE (PF) 0.5% -1:200000 IJ SOLN
INTRAMUSCULAR | Status: DC | PRN
  Administered 2021-07-09: 30 mL

## 2021-07-09 MED ORDER — OXYCODONE HCL 5 MG PO TABS: 5.0000 mg | ORAL_TABLET | Freq: Four times a day (QID) | ORAL | 0 refills | Status: AC | PRN

## 2021-07-09 MED ORDER — FENTANYL CITRATE (PF) 250 MCG/5ML IJ SOLN
INTRAMUSCULAR | Status: AC
  Filled 2021-07-09: qty 5

## 2021-07-09 SURGICAL SUPPLY — 75 items
APL PRP STRL LF DISP 70% ISPRP (MISCELLANEOUS) ×1
BANDAGE ELASTIC 4 VELCRO NS (GAUZE/BANDAGES/DRESSINGS) ×2 IMPLANT
BANDAGE ESMARK 4X12 BL STRL LF (DISPOSABLE) ×1 IMPLANT
BIT DRILL 2.6 CANN (BIT) ×1 IMPLANT
BLADE SURG SZ10 CARB STEEL (BLADE) ×2 IMPLANT
BNDG CMPR 12X4 ELC STRL LF (DISPOSABLE) ×1
BNDG CMPR STD VLCR NS LF 5.8X4 (GAUZE/BANDAGES/DRESSINGS) ×1
BNDG COHESIVE 4X5 TAN STRL (GAUZE/BANDAGES/DRESSINGS) ×2 IMPLANT
BNDG ELASTIC 4X5.8 VLCR NS LF (GAUZE/BANDAGES/DRESSINGS) ×2 IMPLANT
BNDG ESMARK 4X12 BLUE STRL LF (DISPOSABLE) ×2 IMPLANT
CHLORAPREP W/TINT 26 (MISCELLANEOUS) ×2 IMPLANT
CLOTH BEACON ORANGE TIMEOUT ST (SAFETY) ×2 IMPLANT
COVER LIGHT HANDLE STERIS (MISCELLANEOUS) ×4 IMPLANT
CUFF TOURN SGL QUICK 34 (TOURNIQUET CUFF) ×2
CUFF TRNQT CYL 34X4.125X (TOURNIQUET CUFF) ×1 IMPLANT
DECANTER SPIKE VIAL GLASS SM (MISCELLANEOUS) IMPLANT
DRAPE C-ARM FOLDED MOBILE STRL (DRAPES) ×2 IMPLANT
DRAPE C-ARMOR (DRAPES) ×2 IMPLANT
DRAPE ORTHO 2.5IN SPLIT 77X108 (DRAPES) ×1 IMPLANT
DRAPE ORTHO SPLIT 77X108 STRL (DRAPES) ×2
DRILL 2.6X122MM WL AO SHAFT (BIT) IMPLANT
DRSG XEROFORM 1X8 (GAUZE/BANDAGES/DRESSINGS) ×1 IMPLANT
ELECT REM PT RETURN 9FT ADLT (ELECTROSURGICAL) ×2 IMPLANT
ELECTRODE REM PT RTRN 9FT ADLT (ELECTROSURGICAL) ×1 IMPLANT
GAUZE SPONGE 4X4 12PLY STRL (GAUZE/BANDAGES/DRESSINGS) ×2 IMPLANT
GAUZE XEROFORM 1X8 LF (GAUZE/BANDAGES/DRESSINGS) ×2 IMPLANT
GLOVE SRG 8 PF TXTR STRL LF DI (GLOVE) ×1 IMPLANT
GLOVE SURG POLYISO LF SZ8 (GLOVE) ×4 IMPLANT
GLOVE SURG UNDER POLY LF SZ7 (GLOVE) ×6 IMPLANT
GLOVE SURG UNDER POLY LF SZ8 (GLOVE) ×2
GOWN STRL REUS W/ TWL XL LVL3 (GOWN DISPOSABLE) ×1 IMPLANT
GOWN STRL REUS W/TWL LRG LVL3 (GOWN DISPOSABLE) ×4 IMPLANT
GOWN STRL REUS W/TWL XL LVL3 (GOWN DISPOSABLE) ×2
GUIDEWIRE 1.35MM (WIRE) ×2 IMPLANT
INST SET MINOR BONE (KITS) ×2 IMPLANT
K-WIRE 1.6X150 (WIRE)
K-WIRE FX150X1.6XKRSH (WIRE) IMPLANT
K-WIRE ORTHOPEDIC 1.4X150L (WIRE) IMPLANT
K-WIRE SMOOTH 2.0X150 (WIRE) IMPLANT
KIT TURNOVER KIT A (KITS) ×2 IMPLANT
KWIRE FX150X1.6XKRSH (WIRE) IMPLANT
KWIRE ORTHOPEDIC 1.4X150L (WIRE) IMPLANT
KWIRE SMOOTH 2.0X150 (WIRE) IMPLANT
MANIFOLD NEPTUNE II (INSTRUMENTS) ×2 IMPLANT
NDL HYPO 21X1.5 SAFETY (NEEDLE) IMPLANT
NEEDLE HYPO 21X1.5 SAFETY (NEEDLE) IMPLANT
NS IRRIG 1000ML POUR BTL (IV SOLUTION) ×2 IMPLANT
PACK BASIC LIMB (CUSTOM PROCEDURE TRAY) ×2 IMPLANT
PAD ABD 5X9 TENDERSORB (GAUZE/BANDAGES/DRESSINGS) ×8 IMPLANT
PAD ARMBOARD 7.5X6 YLW CONV (MISCELLANEOUS) ×2 IMPLANT
PAD CAST 4YDX4 CTTN HI CHSV (CAST SUPPLIES) ×1 IMPLANT
PADDING CAST ABS 4INX4YD NS (CAST SUPPLIES) ×2
PADDING CAST ABS COTTON 4X4 ST (CAST SUPPLIES) ×2 IMPLANT
PADDING CAST COTTON 4X4 STRL (CAST SUPPLIES) ×2
PADDING WEBRIL 4 STERILE (GAUZE/BANDAGES/DRESSINGS) ×2 IMPLANT
PENCIL SMOKE EVACUATOR (MISCELLANEOUS) ×2 IMPLANT
SCREW CANN 4.0X50 (Screw) ×2 IMPLANT
SCREW CANN T15 ST 50X4 ST (Screw) IMPLANT
SCREW CORT LP TM 3.5X80 (Screw) ×1 IMPLANT
SCREW LOW PROFILE CANN 4.0X45 (Screw) ×1 IMPLANT
SET BASIN LINEN APH (SET/KITS/TRAYS/PACK) ×2 IMPLANT
SPLINT PLASTER CAST XFAST 5X30 (CAST SUPPLIES) IMPLANT
SPLINT PLASTER XFAST SET 5X30 (CAST SUPPLIES) ×1
SPONGE T-LAP 18X18 ~~LOC~~+RFID (SPONGE) ×2 IMPLANT
STAPLER VISISTAT (STAPLE) IMPLANT
SUT ETHILON 3 0 FSL (SUTURE) ×3 IMPLANT
SUT MON AB 0 CT1 (SUTURE) IMPLANT
SUT MON AB 2-0 CT1 36 (SUTURE) IMPLANT
SUT VIC AB 2-0 CT1 27 (SUTURE) ×4
SUT VIC AB 2-0 CT1 TAPERPNT 27 (SUTURE) ×1 IMPLANT
SYR 30ML LL (SYRINGE) ×1 IMPLANT
SYR BULB IRRIG 60ML STRL (SYRINGE) ×4 IMPLANT
WASHER (Orthopedic Implant) ×4 IMPLANT
WASHER ORTHO 7X (Orthopedic Implant) IMPLANT
WATER STERILE IRR 500ML POUR (IV SOLUTION) ×2 IMPLANT

## 2021-07-09 NOTE — Anesthesia Postprocedure Evaluation (Signed)
Anesthesia Post Note  Patient: [name]  Procedure(s) Performed: OPEN REDUCTION INTERNAL FIXATION (ORIF) ANKLE FRACTURE (Right: Ankle)  Patient location during evaluation: Phase II Anesthesia Type: General Level of consciousness: awake Pain management: pain level controlled Vital Signs Assessment: post-procedure vital signs reviewed and stable Respiratory status: spontaneous breathing and respiratory function stable Cardiovascular status: blood pressure returned to baseline and stable Postop Assessment: no headache and no apparent nausea or vomiting Anesthetic complications: no Comments: Late entry   No notable events documented.   Last Vitals:  Vitals:   07/09/21 1030 07/09/21 1041  BP: (!) 185/74 (!) 150/90  Pulse: 67 62  Resp: 15 17  Temp:  36.7 C  SpO2: 96% 100%    Last Pain:  Vitals:   07/09/21 1041  TempSrc: Oral  PainSc: 0-No pain                 Louann Sjogren

## 2021-07-09 NOTE — Telephone Encounter (Signed)
Pt had surgery today, closing message at this time.

## 2021-07-09 NOTE — Op Note (Addendum)
Orthopaedic Surgery Operative Note (CSN: [id number])  [name]  Aug 20, 1945 Date of Surgery: 07/09/2021   Diagnoses:  Right bimalleolar ankle fracture  Procedure: Operative fixation of right ankle fracture   Operative Finding Successful completion of the planned procedure.  Percutaneous placement of 3.5 x 80 mm screw in the fibula, retrograde.  Placement of 2 partially threaded, cannulated screws with washers for the medial malleolus.      Post-Op Diagnosis: Same Surgeons:Primary: Mordecai Rasmussen, MD Assistants:  Vevelyn Royals Location: [facility] OR ROOM 4 Anesthesia: General with local anesthesia Antibiotics: Ancef 2 g Tourniquet time:  Total Tourniquet Time Documented: Thigh (Right) - 50 minutes Total: Thigh (Right) - 50 minutes  Estimated Blood Loss: Minimal Complications: None Specimens: None Implants: Implant Name Type Inv. Item Serial No. Manufacturer Lot No. LRB No. Used Action  SCREW CORT LP TM 3.5X80 - SSTERILE ON SET  Screw SCREW CORT LP TM 3.5X80 STERILE ON SET  [employer]  Right 1 Implanted  SCREW CANN 4.0X50 - SSTERILE ON SET  Screw SCREW CANN 4.0X50 STERILE ON SET  [employer]  Right 1 Implanted  SCREW LOW PROFILE CANN 4.0X45 - SSTERILE ON SET  Screw SCREW LOW PROFILE CANN 4.0X45 STERILE ON SET  [employer]  Right 1 Implanted  WASHER - SSTERILE ON SET  Orthopedic Implant WASHER STERILE ON SET  [employer]  Right 2 Implanted    Indications for Surgery:   [name] is a 76 y.o. female who sustained a right bimalleolar ankle fracture.  She was seen in the ED, and the ankle was reduced.  She was admitted, and subsequently discharged to her assisted living facility.  Based on the displacement, and the severity of the injury, I recommended operative fixation.  Case was discussed with anesthesia prior to scheduling surgery, and they are comfortable with proceeding at [facility].  Given her medical comorbidities, we will try to complete this in a minimally invasive  fashion.  Benefits and risks of operative and nonoperative management were discussed prior to surgery with the patient and informed consent form was completed.  Specific risks including infection, need for additional surgery, bleeding, damage to surrounding structures, nonunion, malunion and more severe complications associated with anesthesia.  She elected to proceed.  Surgical consent was completed.   Procedure:   The patient was identified properly. Informed consent was obtained and the surgical site was marked. The patient was to the OR where general anesthesia was induced.  The patient was positioned supine, with her leg on bone foam.  The right leg was prepped and draped in the usual sterile fashion.  Timeout was performed before the beginning of the case.  Tourniquet was used for the above duration.  She received 2 g of Ancef prior to making incision.  Prior to prepping and draping, we used fluoroscopy to determine that the fractures were minimally displaced, but still able to be reduced easily, without making an incision.  At that point, we made the decision to proceed with minimally invasive surgery, including percutaneous placement of screws.  We started with the distal fibula.  Under fluoroscopy, the distal fibula was reduced.  A clamp was placed across the ankle to maintain the positioning.  We made a small, longitudinal incision just distal to the tip of the fibula.  Under x-ray guidance, a K wire was inserted into the canal of the distal fibula in retrograde fashion.  We then used a cannulated drill bit to open the distal cortex of  the fibula, to allow for placement of the screw.  An 80 mm screw was selected and inserted on hand.  Orthogonal views under fluoroscopy confirmed that the screw was within the canal of the fibula.  The fracture remained reduced.  We then turned our attention to the medial malleolus.  Once again, the distal fragment was mobile.  We are able to reduce this  percutaneously.  A K wire was introduced in a retrograde fashion, orthogonal to the fracture line.  A second K wire was passed in the posterior aspect of the medial malleolus.  Screw lengths were measured.  We made small incisions to allow for passage of the screws.  The cortex was then breached with a cannulated drill bit.  2 cannulated, partially threaded screws with washers were then passed across the fracture site.  These were done under fluoroscopy to confirm adequate reduction.  We also confirm that the screws did not breach the medial aspect of the joint.  We are able to achieve some compression across the fracture site with the use of the washers.  The K wires were then removed.  Final fluoroscopic images confirmed that the fractures were reduced, and the mortise was stable.  No further fixation was required.  We irrigated the wound copiously.  Local anesthesia was injected around the ankle.  Incisions were closed with interrupted nylon sutures.  Sterile dressing was placed, followed by well-padded splint.  Patient was awoken taken to PACU in stable condition.   Post-operative plan:  The patient will be discharged from the PACU, when she is recovered from anesthesia. She will remain nonweightbearing on the right lower extremity. DVT prophylaxis Aspirin 81 mg twice daily for 6 weeks.    Pain control with PRN pain medication preferring oral medicines.   Follow up plan will be scheduled in approximately 10 days for incision check and XR.

## 2021-07-09 NOTE — Anesthesia Procedure Notes (Signed)
Procedure Name: Intubation Date/Time: 07/09/2021 7:38 AM Performed by: Orlie Dakin, CRNA Pre-anesthesia Checklist: Patient identified, Emergency Drugs available, Suction available and Patient being monitored Patient Re-evaluated:Patient Re-evaluated prior to induction Oxygen Delivery Method: Circle system utilized Preoxygenation: Pre-oxygenation with 100% oxygen Induction Type: IV induction Ventilation: Oral airway inserted - appropriate to patient size and Mask ventilation without difficulty Laryngoscope Size: Miller and 3 Grade View: Grade I Tube type: Oral Tube size: 7.0 mm Number of attempts: 1 Airway Equipment and Method: Stylet Placement Confirmation: ETT inserted through vocal cords under direct vision, positive ETCO2 and breath sounds checked- equal and bilateral Secured at: 22 cm Tube secured with: Tape Dental Injury: Teeth and Oropharynx as per pre-operative assessment

## 2021-07-09 NOTE — Discharge Instructions (Signed)
[name] A. Lemarcus Baggerly, MD MS [facility] [address] ,  [place]  [address] Phone: [phone] Fax: [phone]   POST-OPERATIVE INSTRUCTIONS - LOWER EXTREMITY   WOUND CARE Please keep splint clean dry and intact until followup.  You may shower on Post-Op Day #2.  You must keep splint dry during this process and may find that a plastic bag taped around the leg or alternatively a towel based bath may be a better option.   If you get your splint wet or if it is damaged please contact our clinic.  EXERCISES Due to your splint being in place you will not be able to bear weight through your extremity.   DO NOT PUT ANY WEIGHT ON YOUR OPERATIVE LEG Please use crutches or a walker to avoid weight bearing.   REGIONAL ANESTHESIA (NERVE BLOCKS) The anesthesia team may have performed a nerve block for you if safe in the setting of your care.  This is a great tool used to minimize pain.  Typically the block may start wearing off overnight but the long acting medicine may last for 3-4 days.  The nerve block wearing off can be a challenging period but please utilize your as needed pain medications to try and manage this period.    POST-OP MEDICATIONS- Multimodal approach to pain control  In general your pain will be controlled with a combination of substances.  Prescriptions unless otherwise discussed are electronically sent to your pharmacy.  This is a carefully made plan we use to minimize narcotic use.      - Celebrex - Anti-inflammatory medication taken on a scheduled basis  - Acetaminophen - Non-narcotic pain medicine taken on a scheduled basis   - Oxycodone - This is a strong narcotic, to be used only on an "as needed" basis for pain.  -  Aspirin 81mg - This medicine is used to minimize the risk of blood clots after surgery.             -          Zofran - take as needed for nausea   FOLLOW-UP If you develop a Fever (>101.5), Redness or Drainage from the  surgical incision site, please call our office to arrange for an evaluation. Please call the office to schedule a follow-up appointment for your incision check if you do not already have one, 10-14 days post-operatively.  IF YOU HAVE ANY QUESTIONS, PLEASE FEEL FREE TO CALL OUR OFFICE.  HELPFUL INFORMATION  If you had a block, it will wear off between 8-24 hrs postop typically.  This is period when your pain may go from nearly zero to the pain you would have had postop without the block.  This is an abrupt transition but nothing dangerous is happening.  You may take an extra dose of narcotic when this happens.  You should wean off your narcotic medicines as soon as you are able.  Most patients will be off or using minimal narcotics before their first postop appointment.   Elevating your leg will help with swelling and pain control.  You are encouraged to elevate your leg as much as possible in the first couple of weeks following surgery.  Imagine a drop of water on your toe, and your goal is to get that water back to your heart.  We suggest you use the pain medication the first night prior to going to bed, in order to ease any pain when the anesthesia wears off. You   should avoid taking pain medications on an empty stomach as it will make you nauseous.  Do not drink alcoholic beverages or take illicit drugs when taking pain medications.  In most states it is against the law to drive while you are in a splint or sling.  And certainly against the law to drive while taking narcotics.  You may return to work/school in the next couple of days when you feel up to it.   Pain medication may make you constipated.  Below are a few solutions to try in this order: Decrease the amount of pain medication if you aren't having pain. Drink lots of decaffeinated fluids. Drink prune juice and/or each dried prunes  If the first 3 don't work start with additional solutions Take Colace - an over-the-counter stool  softener Take Senokot - an over-the-counter laxative Take Miralax - a stronger over-the-counter laxative  

## 2021-07-09 NOTE — Interval H&P Note (Signed)
History and Physical Interval Note:  07/09/2021 7:20 AM  [name]  has presented today for surgery, with the diagnosis of Right bimalleolar ankle fracture.  The various methods of treatment have been discussed with the patient and family. After consideration of risks, benefits and other options for treatment, the patient has consented to  Procedure(s): OPEN REDUCTION INTERNAL FIXATION (ORIF) ANKLE FRACTURE (Right) as a surgical intervention.  The patient's history has been reviewed, patient examined, no change in status, stable for surgery.  I have reviewed the patient's chart and labs.  Questions were answered to the patient's satisfaction.     Mordecai Rasmussen

## 2021-07-09 NOTE — Transfer of Care (Signed)
Immediate Anesthesia Transfer of Care Note  Patient: [name]  Procedure(s) Performed: OPEN REDUCTION INTERNAL FIXATION (ORIF) ANKLE FRACTURE (Right: Ankle)  Patient Location: PACU  Anesthesia Type:General  Level of Consciousness: awake and oriented  Airway & Oxygen Therapy: Patient Spontanous Breathing and Patient connected to face mask oxygen  Post-op Assessment: Report given to RN and Post -op Vital signs reviewed and stable  Post vital signs: Reviewed and stable  Last Vitals:  Vitals Value Taken Time  BP 159/96 07/09/21 0909  Temp    Pulse 66 07/09/21 0910  Resp 14 07/09/21 0910  SpO2 100 % 07/09/21 0910  Vitals shown include unvalidated device data.  Last Pain:  Vitals:   07/09/21 0650  TempSrc: Oral  PainSc: 8       Patients Stated Pain Goal: 5 (16/10/96 0454)  Complications: No notable events documented.

## 2021-07-09 NOTE — Anesthesia Preprocedure Evaluation (Addendum)
Anesthesia Evaluation  Patient identified by MRN, date of birth, ID band Patient awake    Reviewed: Allergy & Precautions, H&P , NPO status , Patient's Chart, lab work & pertinent test results, reviewed documented beta blocker date and time   Airway Mallampati: II  TM Distance: >3 FB Neck ROM: full    Dental  (+) Dental Advisory Given, Edentulous Upper, Edentulous Lower   Pulmonary pneumonia, resolved, COPD, former smoker,    Pulmonary exam normal breath sounds clear to auscultation       Cardiovascular Exercise Tolerance: Good hypertension, + CAD, + Cardiac Stents and +CHF   Rhythm:regular Rate:Normal     Neuro/Psych PSYCHIATRIC DISORDERS Anxiety negative neurological ROS     GI/Hepatic GERD  Medicated,(+) Hepatitis -  Endo/Other  diabetes, Type 2Hypothyroidism   Renal/GU CRFRenal disease  negative genitourinary   Musculoskeletal   Abdominal   Peds  Hematology negative hematology ROS (+)   Anesthesia Other Findings   Reproductive/Obstetrics negative OB ROS                            Anesthesia Physical Anesthesia Plan  ASA: 3  Anesthesia Plan: General and General ETT   Post-op Pain Management:    Induction:   PONV Risk Score and Plan: Ondansetron  Airway Management Planned:   Additional Equipment:   Intra-op Plan:   Post-operative Plan:   Informed Consent: I have reviewed the patients History and Physical, chart, labs and discussed the procedure including the risks, benefits and alternatives for the proposed anesthesia with the patient or authorized representative who has indicated his/her understanding and acceptance.     Dental Advisory Given  Plan Discussed with: CRNA  Anesthesia Plan Comments:         Anesthesia Quick Evaluation

## 2021-07-13 ENCOUNTER — Encounter (HOSPITAL_COMMUNITY): Payer: Self-pay | Admitting: Orthopedic Surgery

## 2021-07-14 DIAGNOSIS — Z79899 Other long term (current) drug therapy: Secondary | ICD-10-CM | POA: Diagnosis not present

## 2021-07-14 DIAGNOSIS — R5381 Other malaise: Secondary | ICD-10-CM | POA: Diagnosis not present

## 2021-07-14 DIAGNOSIS — S82891D Other fracture of right lower leg, subsequent encounter for closed fracture with routine healing: Secondary | ICD-10-CM | POA: Diagnosis not present

## 2021-07-15 DIAGNOSIS — U071 COVID-19: Secondary | ICD-10-CM | POA: Diagnosis not present

## 2021-07-15 DIAGNOSIS — H547 Unspecified visual loss: Secondary | ICD-10-CM | POA: Diagnosis not present

## 2021-07-15 DIAGNOSIS — W19XXXD Unspecified fall, subsequent encounter: Secondary | ICD-10-CM | POA: Diagnosis not present

## 2021-07-15 DIAGNOSIS — E1165 Type 2 diabetes mellitus with hyperglycemia: Secondary | ICD-10-CM | POA: Diagnosis not present

## 2021-07-15 DIAGNOSIS — I2510 Atherosclerotic heart disease of native coronary artery without angina pectoris: Secondary | ICD-10-CM | POA: Diagnosis not present

## 2021-07-15 DIAGNOSIS — E1140 Type 2 diabetes mellitus with diabetic neuropathy, unspecified: Secondary | ICD-10-CM | POA: Diagnosis not present

## 2021-07-15 DIAGNOSIS — E876 Hypokalemia: Secondary | ICD-10-CM | POA: Diagnosis not present

## 2021-07-15 DIAGNOSIS — I10 Essential (primary) hypertension: Secondary | ICD-10-CM | POA: Diagnosis not present

## 2021-07-15 DIAGNOSIS — S82841D Displaced bimalleolar fracture of right lower leg, subsequent encounter for closed fracture with routine healing: Secondary | ICD-10-CM | POA: Diagnosis not present

## 2021-07-15 DIAGNOSIS — J449 Chronic obstructive pulmonary disease, unspecified: Secondary | ICD-10-CM | POA: Diagnosis not present

## 2021-07-17 ENCOUNTER — Ambulatory Visit: Payer: Medicare Other

## 2021-07-17 ENCOUNTER — Ambulatory Visit (INDEPENDENT_AMBULATORY_CARE_PROVIDER_SITE_OTHER): Payer: Medicare Other | Admitting: Orthopedic Surgery

## 2021-07-17 ENCOUNTER — Encounter: Payer: Self-pay | Admitting: Orthopedic Surgery

## 2021-07-17 ENCOUNTER — Other Ambulatory Visit: Payer: Self-pay

## 2021-07-17 DIAGNOSIS — S82891D Other fracture of right lower leg, subsequent encounter for closed fracture with routine healing: Secondary | ICD-10-CM | POA: Diagnosis not present

## 2021-07-17 NOTE — Patient Instructions (Signed)

## 2021-07-17 NOTE — Progress Notes (Signed)
/  Orthopaedic Postop Note  Assessment: [name] is a 76 y.o. female s/p ORIF of Right ankle fracture  DOS: 07/09/21  Plan: Sutures removed, steri strips placed Well padded short leg cast placed in clinic today NWB on the operative extremity Continue to take Aspirin 81 mg BID  Cast application - right short leg cast   Verbal consent was obtained and the correct extremity was identified. A well padded, appropriately molded short leg cast was applied to the right leg Toes remained warm and well perfused.   There were no sharp edges Patient tolerated the procedure well Cast care instructions were provided     Follow-up: Return in about 2 weeks (around 07/31/2021). XR at next visit: Right ankle  Subjective:  Chief Complaint  Patient presents with   Post-op Follow-up    Ankle right 07/09/21     History of Present Illness: [name] is a 76 y.o. female who presents following the above stated procedure.  She is doing well overall.  She is remained nonweightbearing.  She does continue to have some pain in her foot and ankle.  She is also complaining of pain in her left ankle.  Review of Systems: No fevers or chills No numbness or tingling No Chest Pain No shortness of breath   Objective:  Physical Exam:  Alert and oriented.  No acute distress.  She is feeling some nausea following the x-rays.  Evaluation of the medial and lateral ankles demonstrates well-healing surgical incisions.  No surrounding erythema or drainage.  Mild tenderness to palpation over the medial lateral ankle.  Swelling has improved.  Toes are warm and well-perfused.  IMAGING: I personally ordered and reviewed the following images  X-rays of the right ankle were obtained in clinic today, out of the splint.  There is a maintenance of alignment of the distal fibula, as well as the medial malleolus fracture fragments.  No interval displacement.  The mortise remains intact.  No syndesmotic  disruption.  No acute injuries are Noted.  Impression: Healing right bimalleolar ankle fracture following operative fixation.  Mordecai Rasmussen, MD 07/17/2021 10:16 AM

## 2021-07-21 ENCOUNTER — Ambulatory Visit (INDEPENDENT_AMBULATORY_CARE_PROVIDER_SITE_OTHER): Payer: Medicare Other | Admitting: Internal Medicine

## 2021-07-21 ENCOUNTER — Encounter: Payer: Medicare Other | Admitting: Orthopedic Surgery

## 2021-07-21 DIAGNOSIS — H547 Unspecified visual loss: Secondary | ICD-10-CM | POA: Diagnosis not present

## 2021-07-21 DIAGNOSIS — I2510 Atherosclerotic heart disease of native coronary artery without angina pectoris: Secondary | ICD-10-CM | POA: Diagnosis not present

## 2021-07-21 DIAGNOSIS — I10 Essential (primary) hypertension: Secondary | ICD-10-CM | POA: Diagnosis not present

## 2021-07-21 DIAGNOSIS — E1140 Type 2 diabetes mellitus with diabetic neuropathy, unspecified: Secondary | ICD-10-CM | POA: Diagnosis not present

## 2021-07-21 DIAGNOSIS — E1165 Type 2 diabetes mellitus with hyperglycemia: Secondary | ICD-10-CM | POA: Diagnosis not present

## 2021-07-21 DIAGNOSIS — J449 Chronic obstructive pulmonary disease, unspecified: Secondary | ICD-10-CM | POA: Diagnosis not present

## 2021-07-21 DIAGNOSIS — S82841D Displaced bimalleolar fracture of right lower leg, subsequent encounter for closed fracture with routine healing: Secondary | ICD-10-CM | POA: Diagnosis not present

## 2021-07-21 DIAGNOSIS — E876 Hypokalemia: Secondary | ICD-10-CM | POA: Diagnosis not present

## 2021-07-21 DIAGNOSIS — U071 COVID-19: Secondary | ICD-10-CM | POA: Diagnosis not present

## 2021-07-21 DIAGNOSIS — W19XXXD Unspecified fall, subsequent encounter: Secondary | ICD-10-CM | POA: Diagnosis not present

## 2021-07-23 DIAGNOSIS — E1140 Type 2 diabetes mellitus with diabetic neuropathy, unspecified: Secondary | ICD-10-CM | POA: Diagnosis not present

## 2021-07-23 DIAGNOSIS — E1165 Type 2 diabetes mellitus with hyperglycemia: Secondary | ICD-10-CM | POA: Diagnosis not present

## 2021-07-23 DIAGNOSIS — I10 Essential (primary) hypertension: Secondary | ICD-10-CM | POA: Diagnosis not present

## 2021-07-23 DIAGNOSIS — S82841D Displaced bimalleolar fracture of right lower leg, subsequent encounter for closed fracture with routine healing: Secondary | ICD-10-CM | POA: Diagnosis not present

## 2021-07-23 DIAGNOSIS — I2510 Atherosclerotic heart disease of native coronary artery without angina pectoris: Secondary | ICD-10-CM | POA: Diagnosis not present

## 2021-07-23 DIAGNOSIS — E876 Hypokalemia: Secondary | ICD-10-CM | POA: Diagnosis not present

## 2021-07-23 DIAGNOSIS — H547 Unspecified visual loss: Secondary | ICD-10-CM | POA: Diagnosis not present

## 2021-07-23 DIAGNOSIS — J449 Chronic obstructive pulmonary disease, unspecified: Secondary | ICD-10-CM | POA: Diagnosis not present

## 2021-07-23 DIAGNOSIS — U071 COVID-19: Secondary | ICD-10-CM | POA: Diagnosis not present

## 2021-07-23 DIAGNOSIS — W19XXXD Unspecified fall, subsequent encounter: Secondary | ICD-10-CM | POA: Diagnosis not present

## 2021-07-27 DIAGNOSIS — I10 Essential (primary) hypertension: Secondary | ICD-10-CM | POA: Diagnosis not present

## 2021-07-28 DIAGNOSIS — I2510 Atherosclerotic heart disease of native coronary artery without angina pectoris: Secondary | ICD-10-CM | POA: Diagnosis not present

## 2021-07-28 DIAGNOSIS — E876 Hypokalemia: Secondary | ICD-10-CM | POA: Diagnosis not present

## 2021-07-28 DIAGNOSIS — W19XXXD Unspecified fall, subsequent encounter: Secondary | ICD-10-CM | POA: Diagnosis not present

## 2021-07-28 DIAGNOSIS — S82841D Displaced bimalleolar fracture of right lower leg, subsequent encounter for closed fracture with routine healing: Secondary | ICD-10-CM | POA: Diagnosis not present

## 2021-07-28 DIAGNOSIS — J449 Chronic obstructive pulmonary disease, unspecified: Secondary | ICD-10-CM | POA: Diagnosis not present

## 2021-07-28 DIAGNOSIS — E1140 Type 2 diabetes mellitus with diabetic neuropathy, unspecified: Secondary | ICD-10-CM | POA: Diagnosis not present

## 2021-07-28 DIAGNOSIS — I10 Essential (primary) hypertension: Secondary | ICD-10-CM | POA: Diagnosis not present

## 2021-07-28 DIAGNOSIS — U071 COVID-19: Secondary | ICD-10-CM | POA: Diagnosis not present

## 2021-07-28 DIAGNOSIS — H547 Unspecified visual loss: Secondary | ICD-10-CM | POA: Diagnosis not present

## 2021-07-28 DIAGNOSIS — E1165 Type 2 diabetes mellitus with hyperglycemia: Secondary | ICD-10-CM | POA: Diagnosis not present

## 2021-07-30 DIAGNOSIS — H547 Unspecified visual loss: Secondary | ICD-10-CM | POA: Diagnosis not present

## 2021-07-30 DIAGNOSIS — E876 Hypokalemia: Secondary | ICD-10-CM | POA: Diagnosis not present

## 2021-07-30 DIAGNOSIS — I2510 Atherosclerotic heart disease of native coronary artery without angina pectoris: Secondary | ICD-10-CM | POA: Diagnosis not present

## 2021-07-30 DIAGNOSIS — S82841D Displaced bimalleolar fracture of right lower leg, subsequent encounter for closed fracture with routine healing: Secondary | ICD-10-CM | POA: Diagnosis not present

## 2021-07-30 DIAGNOSIS — J449 Chronic obstructive pulmonary disease, unspecified: Secondary | ICD-10-CM | POA: Diagnosis not present

## 2021-07-30 DIAGNOSIS — E1165 Type 2 diabetes mellitus with hyperglycemia: Secondary | ICD-10-CM | POA: Diagnosis not present

## 2021-07-30 DIAGNOSIS — W19XXXD Unspecified fall, subsequent encounter: Secondary | ICD-10-CM | POA: Diagnosis not present

## 2021-07-30 DIAGNOSIS — U071 COVID-19: Secondary | ICD-10-CM | POA: Diagnosis not present

## 2021-07-30 DIAGNOSIS — I10 Essential (primary) hypertension: Secondary | ICD-10-CM | POA: Diagnosis not present

## 2021-07-30 DIAGNOSIS — E1140 Type 2 diabetes mellitus with diabetic neuropathy, unspecified: Secondary | ICD-10-CM | POA: Diagnosis not present

## 2021-07-31 DIAGNOSIS — E1100 Type 2 diabetes mellitus with hyperosmolarity without nonketotic hyperglycemic-hyperosmolar coma (NKHHC): Secondary | ICD-10-CM | POA: Diagnosis not present

## 2021-07-31 DIAGNOSIS — D518 Other vitamin B12 deficiency anemias: Secondary | ICD-10-CM | POA: Diagnosis not present

## 2021-07-31 DIAGNOSIS — E038 Other specified hypothyroidism: Secondary | ICD-10-CM | POA: Diagnosis not present

## 2021-07-31 DIAGNOSIS — J449 Chronic obstructive pulmonary disease, unspecified: Secondary | ICD-10-CM | POA: Diagnosis not present

## 2021-07-31 DIAGNOSIS — E1165 Type 2 diabetes mellitus with hyperglycemia: Secondary | ICD-10-CM | POA: Diagnosis not present

## 2021-07-31 DIAGNOSIS — I2510 Atherosclerotic heart disease of native coronary artery without angina pectoris: Secondary | ICD-10-CM | POA: Diagnosis not present

## 2021-07-31 DIAGNOSIS — E785 Hyperlipidemia, unspecified: Secondary | ICD-10-CM | POA: Diagnosis not present

## 2021-07-31 DIAGNOSIS — E559 Vitamin D deficiency, unspecified: Secondary | ICD-10-CM | POA: Diagnosis not present

## 2021-07-31 DIAGNOSIS — I5030 Unspecified diastolic (congestive) heart failure: Secondary | ICD-10-CM | POA: Diagnosis not present

## 2021-07-31 DIAGNOSIS — E119 Type 2 diabetes mellitus without complications: Secondary | ICD-10-CM | POA: Diagnosis not present

## 2021-07-31 DIAGNOSIS — I10 Essential (primary) hypertension: Secondary | ICD-10-CM | POA: Diagnosis not present

## 2021-08-04 ENCOUNTER — Other Ambulatory Visit: Payer: Self-pay

## 2021-08-04 ENCOUNTER — Ambulatory Visit (INDEPENDENT_AMBULATORY_CARE_PROVIDER_SITE_OTHER): Payer: Medicare Other

## 2021-08-04 ENCOUNTER — Ambulatory Visit (INDEPENDENT_AMBULATORY_CARE_PROVIDER_SITE_OTHER): Payer: Medicare Other | Admitting: Orthopedic Surgery

## 2021-08-04 ENCOUNTER — Encounter: Payer: Self-pay | Admitting: Orthopedic Surgery

## 2021-08-04 VITALS — Ht 61.0 in | Wt 202.0 lb

## 2021-08-04 DIAGNOSIS — R296 Repeated falls: Secondary | ICD-10-CM | POA: Diagnosis not present

## 2021-08-04 DIAGNOSIS — K219 Gastro-esophageal reflux disease without esophagitis: Secondary | ICD-10-CM | POA: Diagnosis not present

## 2021-08-04 DIAGNOSIS — I129 Hypertensive chronic kidney disease with stage 1 through stage 4 chronic kidney disease, or unspecified chronic kidney disease: Secondary | ICD-10-CM | POA: Diagnosis not present

## 2021-08-04 DIAGNOSIS — D649 Anemia, unspecified: Secondary | ICD-10-CM | POA: Diagnosis not present

## 2021-08-04 DIAGNOSIS — M5432 Sciatica, left side: Secondary | ICD-10-CM | POA: Diagnosis not present

## 2021-08-04 DIAGNOSIS — E785 Hyperlipidemia, unspecified: Secondary | ICD-10-CM | POA: Diagnosis not present

## 2021-08-04 DIAGNOSIS — S82891D Other fracture of right lower leg, subsequent encounter for closed fracture with routine healing: Secondary | ICD-10-CM

## 2021-08-04 DIAGNOSIS — E119 Type 2 diabetes mellitus without complications: Secondary | ICD-10-CM | POA: Diagnosis not present

## 2021-08-04 DIAGNOSIS — H547 Unspecified visual loss: Secondary | ICD-10-CM | POA: Diagnosis not present

## 2021-08-04 DIAGNOSIS — K589 Irritable bowel syndrome without diarrhea: Secondary | ICD-10-CM | POA: Diagnosis not present

## 2021-08-04 DIAGNOSIS — I2510 Atherosclerotic heart disease of native coronary artery without angina pectoris: Secondary | ICD-10-CM | POA: Diagnosis not present

## 2021-08-04 DIAGNOSIS — E039 Hypothyroidism, unspecified: Secondary | ICD-10-CM | POA: Diagnosis not present

## 2021-08-04 NOTE — Patient Instructions (Addendum)
General Cast Instructions ? ?1.  You were placed in a cast in clinic today.  Please keep the cast material clean, dry and intact.  Please do not use anything to itch the under the cast.  If it gets itchy, you can consider taking benadryl, or similar medication.  If the cast material gets wet, place it on a towel and use a hair dryer on a low setting. ?2.  Tylenol or Ibuprofen/Naproxen as needed.   ?3.  Recommend elevating your extremity as much as possible to help with swelling. ?4.  F/u 2 weeks, cast off and repeat XR  ? ? ?NWB on the RLE ? ?Touch down weight bearing for transfers only.  ?

## 2021-08-04 NOTE — Progress Notes (Signed)
/  Orthopaedic Postop Note ? ?Assessment: ?[name] is a 76 y.o. female s/p ORIF of Right ankle fracture ? ?DOS: 07/09/21 ? ?Plan: ?[name] is doing well.  Minimal pain in her ankle.  Incisions are healing well.  She has no swelling about the ankle.  Radiographs are stable.  At least 2 more weeks of immobilization in a cast.  Consider transition to a cam boot at that time.  Okay to bear some weight for transfers only. ? ?Cast application - right short leg cast ?  ?Verbal consent was obtained and the correct extremity was identified. ?A well padded, appropriately molded short leg cast was applied to the right leg ?Toes remained warm and well perfused.   ?There were no sharp edges ?Patient tolerated the procedure well ?Cast care instructions were provided  ? ? ? ?Follow-up: ?Return in about 2 weeks (around 08/18/2021). ?XR at next visit: Right ankle ? ?Subjective: ? ?Chief Complaint  ?Patient presents with  ? Fracture  ?  Rt ankle DOS 07/09/21  ? ? ?History of Present Illness: ?[name] is a 76 y.o. female who presents following the above stated procedure.  Surgery was approximately 1 month ago.  Minimal pain in her foot and ankle.  She states she has put some weight on her ankle, and it feels okay.  She has tolerated the cast well.  No fevers or chills. ? ? ?Review of Systems: ?No fevers or chills ?No numbness or tingling ?No Chest Pain ?No shortness of breath ? ? ?Objective: ? ?Physical Exam: ? ?Alert and oriented.  No acute distress.  She is feeling some nausea following the x-rays. ? ?Evaluation of the right ankle after removal of the cast demonstrates minimal swelling.  No bruising.  Surgical incisions are healing well.  No surrounding erythema or drainage.  Some mild scabbing remains but no other concerning features.  Active motion intact in the EHL/TA.  Passive dorsiflexion causes some discomfort in her Achilles.  Sensation is intact over the dorsum of her foot. ? ?IMAGING: ?I personally ordered and  reviewed the following images ? ?X-rays of the right ankle were obtained in clinic today.  Alignment of the distal fibula, as well as the medial malleolus fracture fragments remains unchanged.  No evidence of hardware failure or screw backing out.  Mortise is congruent.  No evidence of syndesmotic disruption. ? ?Impression: Right ankle fracture in stable alignment following operative fixation. ? ?Mordecai Rasmussen, MD ?08/04/2021 ?10:29 PM ? ? ?

## 2021-08-05 DIAGNOSIS — M5136 Other intervertebral disc degeneration, lumbar region: Secondary | ICD-10-CM | POA: Diagnosis not present

## 2021-08-05 DIAGNOSIS — E1140 Type 2 diabetes mellitus with diabetic neuropathy, unspecified: Secondary | ICD-10-CM | POA: Diagnosis not present

## 2021-08-05 DIAGNOSIS — S82841D Displaced bimalleolar fracture of right lower leg, subsequent encounter for closed fracture with routine healing: Secondary | ICD-10-CM | POA: Diagnosis not present

## 2021-08-05 DIAGNOSIS — E876 Hypokalemia: Secondary | ICD-10-CM | POA: Diagnosis not present

## 2021-08-05 DIAGNOSIS — H547 Unspecified visual loss: Secondary | ICD-10-CM | POA: Diagnosis not present

## 2021-08-05 DIAGNOSIS — W19XXXD Unspecified fall, subsequent encounter: Secondary | ICD-10-CM | POA: Diagnosis not present

## 2021-08-05 DIAGNOSIS — U071 COVID-19: Secondary | ICD-10-CM | POA: Diagnosis not present

## 2021-08-05 DIAGNOSIS — I2510 Atherosclerotic heart disease of native coronary artery without angina pectoris: Secondary | ICD-10-CM | POA: Diagnosis not present

## 2021-08-05 DIAGNOSIS — J449 Chronic obstructive pulmonary disease, unspecified: Secondary | ICD-10-CM | POA: Diagnosis not present

## 2021-08-05 DIAGNOSIS — E1165 Type 2 diabetes mellitus with hyperglycemia: Secondary | ICD-10-CM | POA: Diagnosis not present

## 2021-08-05 DIAGNOSIS — I10 Essential (primary) hypertension: Secondary | ICD-10-CM | POA: Diagnosis not present

## 2021-08-06 DIAGNOSIS — U071 COVID-19: Secondary | ICD-10-CM | POA: Diagnosis not present

## 2021-08-06 DIAGNOSIS — H547 Unspecified visual loss: Secondary | ICD-10-CM | POA: Diagnosis not present

## 2021-08-06 DIAGNOSIS — E1140 Type 2 diabetes mellitus with diabetic neuropathy, unspecified: Secondary | ICD-10-CM | POA: Diagnosis not present

## 2021-08-06 DIAGNOSIS — S82841D Displaced bimalleolar fracture of right lower leg, subsequent encounter for closed fracture with routine healing: Secondary | ICD-10-CM | POA: Diagnosis not present

## 2021-08-06 DIAGNOSIS — E1165 Type 2 diabetes mellitus with hyperglycemia: Secondary | ICD-10-CM | POA: Diagnosis not present

## 2021-08-06 DIAGNOSIS — I2510 Atherosclerotic heart disease of native coronary artery without angina pectoris: Secondary | ICD-10-CM | POA: Diagnosis not present

## 2021-08-06 DIAGNOSIS — W19XXXD Unspecified fall, subsequent encounter: Secondary | ICD-10-CM | POA: Diagnosis not present

## 2021-08-06 DIAGNOSIS — I10 Essential (primary) hypertension: Secondary | ICD-10-CM | POA: Diagnosis not present

## 2021-08-06 DIAGNOSIS — E876 Hypokalemia: Secondary | ICD-10-CM | POA: Diagnosis not present

## 2021-08-06 DIAGNOSIS — J449 Chronic obstructive pulmonary disease, unspecified: Secondary | ICD-10-CM | POA: Diagnosis not present

## 2021-08-11 DIAGNOSIS — S82841D Displaced bimalleolar fracture of right lower leg, subsequent encounter for closed fracture with routine healing: Secondary | ICD-10-CM | POA: Diagnosis not present

## 2021-08-11 DIAGNOSIS — E876 Hypokalemia: Secondary | ICD-10-CM | POA: Diagnosis not present

## 2021-08-11 DIAGNOSIS — E1140 Type 2 diabetes mellitus with diabetic neuropathy, unspecified: Secondary | ICD-10-CM | POA: Diagnosis not present

## 2021-08-11 DIAGNOSIS — J449 Chronic obstructive pulmonary disease, unspecified: Secondary | ICD-10-CM | POA: Diagnosis not present

## 2021-08-11 DIAGNOSIS — H547 Unspecified visual loss: Secondary | ICD-10-CM | POA: Diagnosis not present

## 2021-08-11 DIAGNOSIS — E1165 Type 2 diabetes mellitus with hyperglycemia: Secondary | ICD-10-CM | POA: Diagnosis not present

## 2021-08-11 DIAGNOSIS — U071 COVID-19: Secondary | ICD-10-CM | POA: Diagnosis not present

## 2021-08-11 DIAGNOSIS — I2510 Atherosclerotic heart disease of native coronary artery without angina pectoris: Secondary | ICD-10-CM | POA: Diagnosis not present

## 2021-08-11 DIAGNOSIS — I10 Essential (primary) hypertension: Secondary | ICD-10-CM | POA: Diagnosis not present

## 2021-08-11 DIAGNOSIS — W19XXXD Unspecified fall, subsequent encounter: Secondary | ICD-10-CM | POA: Diagnosis not present

## 2021-08-13 DIAGNOSIS — E1140 Type 2 diabetes mellitus with diabetic neuropathy, unspecified: Secondary | ICD-10-CM | POA: Diagnosis not present

## 2021-08-13 DIAGNOSIS — S82841D Displaced bimalleolar fracture of right lower leg, subsequent encounter for closed fracture with routine healing: Secondary | ICD-10-CM | POA: Diagnosis not present

## 2021-08-13 DIAGNOSIS — J449 Chronic obstructive pulmonary disease, unspecified: Secondary | ICD-10-CM | POA: Diagnosis not present

## 2021-08-13 DIAGNOSIS — I10 Essential (primary) hypertension: Secondary | ICD-10-CM | POA: Diagnosis not present

## 2021-08-13 DIAGNOSIS — W19XXXD Unspecified fall, subsequent encounter: Secondary | ICD-10-CM | POA: Diagnosis not present

## 2021-08-13 DIAGNOSIS — I2510 Atherosclerotic heart disease of native coronary artery without angina pectoris: Secondary | ICD-10-CM | POA: Diagnosis not present

## 2021-08-13 DIAGNOSIS — H547 Unspecified visual loss: Secondary | ICD-10-CM | POA: Diagnosis not present

## 2021-08-13 DIAGNOSIS — E876 Hypokalemia: Secondary | ICD-10-CM | POA: Diagnosis not present

## 2021-08-13 DIAGNOSIS — U071 COVID-19: Secondary | ICD-10-CM | POA: Diagnosis not present

## 2021-08-13 DIAGNOSIS — E1165 Type 2 diabetes mellitus with hyperglycemia: Secondary | ICD-10-CM | POA: Diagnosis not present

## 2021-08-18 ENCOUNTER — Ambulatory Visit (INDEPENDENT_AMBULATORY_CARE_PROVIDER_SITE_OTHER): Payer: Medicare Other

## 2021-08-18 ENCOUNTER — Other Ambulatory Visit: Payer: Self-pay

## 2021-08-18 ENCOUNTER — Encounter: Payer: Self-pay | Admitting: Orthopedic Surgery

## 2021-08-18 ENCOUNTER — Ambulatory Visit (INDEPENDENT_AMBULATORY_CARE_PROVIDER_SITE_OTHER): Payer: Medicare Other | Admitting: Orthopedic Surgery

## 2021-08-18 VITALS — Ht 61.0 in | Wt 202.0 lb

## 2021-08-18 DIAGNOSIS — S82891D Other fracture of right lower leg, subsequent encounter for closed fracture with routine healing: Secondary | ICD-10-CM | POA: Diagnosis not present

## 2021-08-18 NOTE — Progress Notes (Signed)
/  Orthopaedic Postop Note ? ?Assessment: ?[name] is a 76 y.o. female s/p ORIF of Right ankle fracture ? ?DOS: 07/09/21 ? ?Plan: ?[name] is doing well.  Radiographs are stable.  She has no pain.  No swelling on physical exam.  We will transition her to a walking boot today.  Of asked her to remain nonweightbearing for the next 2 weeks.  Okay to bear some weight for transfers only.  In 2 weeks, she can advance to weightbearing as tolerated, while wearing the boot.  Follow-up in 4 weeks. ? ? ?Follow-up: ?Return in about 4 weeks (around 09/15/2021). ?XR at next visit: Right ankle ? ?Subjective: ? ?Chief Complaint  ?Patient presents with  ? Fracture  ?  Rt ankle DOI 07/09/21  ? ? ?History of Present Illness: ?[name] is a 76 y.o. female who presents following the above stated procedure.  Surgery was 6 weeks ago.  She continues to do well.  She has been bearing some weight for transfers, and is not complaining of pain. ? ? ?Review of Systems: ?No fevers or chills ?No numbness or tingling ?No Chest Pain ?No shortness of breath ? ? ?Objective: ? ?Physical Exam: ? ?Alert and oriented.  No acute distress.  She is feeling some nausea following the x-rays. ? ?Right ankle with diffuse dry skin.  Incisions are healing well, without surrounding erythema or drainage.  She has no tenderness to palpation over the incisions.  She tolerates dorsiflexion, plantarflexion, inversion and eversion without discomfort.  Toes are warm and well-perfused. ? ?IMAGING: ?I personally ordered and reviewed the following images ? ?X-rays of the right ankle were obtained in clinic today.  There has been no interval displacement of the distal fibula and medial malleolus fractures.  The mortise remains congruent.  There is no syndesmotic disruption.  No evidence of screws backing out. ? ?Impression: Healing right ankle fracture following operative fixation.  No hardware failure or loosening. ? ? ?Mordecai Rasmussen, MD ?08/18/2021 ?10:21  AM ? ? ?

## 2021-08-20 ENCOUNTER — Other Ambulatory Visit: Payer: Self-pay

## 2021-08-20 ENCOUNTER — Encounter (INDEPENDENT_AMBULATORY_CARE_PROVIDER_SITE_OTHER): Payer: Self-pay | Admitting: Internal Medicine

## 2021-08-20 ENCOUNTER — Ambulatory Visit (INDEPENDENT_AMBULATORY_CARE_PROVIDER_SITE_OTHER): Payer: Medicare Other | Admitting: Internal Medicine

## 2021-08-20 VITALS — BP 146/60 | HR 52 | Temp 98.8°F | Ht 61.0 in | Wt 195.0 lb

## 2021-08-20 DIAGNOSIS — I10 Essential (primary) hypertension: Secondary | ICD-10-CM | POA: Diagnosis not present

## 2021-08-20 DIAGNOSIS — J449 Chronic obstructive pulmonary disease, unspecified: Secondary | ICD-10-CM | POA: Diagnosis not present

## 2021-08-20 DIAGNOSIS — K7581 Nonalcoholic steatohepatitis (NASH): Secondary | ICD-10-CM | POA: Diagnosis not present

## 2021-08-20 DIAGNOSIS — H547 Unspecified visual loss: Secondary | ICD-10-CM | POA: Diagnosis not present

## 2021-08-20 DIAGNOSIS — K754 Autoimmune hepatitis: Secondary | ICD-10-CM | POA: Diagnosis not present

## 2021-08-20 DIAGNOSIS — E876 Hypokalemia: Secondary | ICD-10-CM | POA: Diagnosis not present

## 2021-08-20 DIAGNOSIS — S82841D Displaced bimalleolar fracture of right lower leg, subsequent encounter for closed fracture with routine healing: Secondary | ICD-10-CM | POA: Diagnosis not present

## 2021-08-20 DIAGNOSIS — U071 COVID-19: Secondary | ICD-10-CM | POA: Diagnosis not present

## 2021-08-20 DIAGNOSIS — E1140 Type 2 diabetes mellitus with diabetic neuropathy, unspecified: Secondary | ICD-10-CM | POA: Diagnosis not present

## 2021-08-20 DIAGNOSIS — I2510 Atherosclerotic heart disease of native coronary artery without angina pectoris: Secondary | ICD-10-CM | POA: Diagnosis not present

## 2021-08-20 DIAGNOSIS — W19XXXD Unspecified fall, subsequent encounter: Secondary | ICD-10-CM | POA: Diagnosis not present

## 2021-08-20 DIAGNOSIS — E1165 Type 2 diabetes mellitus with hyperglycemia: Secondary | ICD-10-CM | POA: Diagnosis not present

## 2021-08-20 MED ORDER — PREDNISONE 5 MG PO TABS: 15.0000 mg | ORAL_TABLET | Freq: Every day | ORAL | 1 refills | Status: DC
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
## 2021-08-20 NOTE — Patient Instructions (Signed)
Please do blood work as recommended next week and send [place] a copy report ?Prednisone schedule as follows ?15 mg by mouth every morning for 1 week and then ?10 mg by mouth every morning. ? ?Repeat LFTs in 4 weeks. ?

## 2021-08-24 DIAGNOSIS — I10 Essential (primary) hypertension: Secondary | ICD-10-CM | POA: Diagnosis not present

## 2021-08-25 DIAGNOSIS — H547 Unspecified visual loss: Secondary | ICD-10-CM | POA: Diagnosis not present

## 2021-08-25 DIAGNOSIS — E876 Hypokalemia: Secondary | ICD-10-CM | POA: Diagnosis not present

## 2021-08-25 DIAGNOSIS — J449 Chronic obstructive pulmonary disease, unspecified: Secondary | ICD-10-CM | POA: Diagnosis not present

## 2021-08-25 DIAGNOSIS — E1165 Type 2 diabetes mellitus with hyperglycemia: Secondary | ICD-10-CM | POA: Diagnosis not present

## 2021-08-25 DIAGNOSIS — S82841D Displaced bimalleolar fracture of right lower leg, subsequent encounter for closed fracture with routine healing: Secondary | ICD-10-CM | POA: Diagnosis not present

## 2021-08-25 DIAGNOSIS — E1140 Type 2 diabetes mellitus with diabetic neuropathy, unspecified: Secondary | ICD-10-CM | POA: Diagnosis not present

## 2021-08-25 DIAGNOSIS — I10 Essential (primary) hypertension: Secondary | ICD-10-CM | POA: Diagnosis not present

## 2021-08-25 DIAGNOSIS — U071 COVID-19: Secondary | ICD-10-CM | POA: Diagnosis not present

## 2021-08-25 DIAGNOSIS — I2510 Atherosclerotic heart disease of native coronary artery without angina pectoris: Secondary | ICD-10-CM | POA: Diagnosis not present

## 2021-08-25 DIAGNOSIS — W19XXXD Unspecified fall, subsequent encounter: Secondary | ICD-10-CM | POA: Diagnosis not present

## 2021-08-26 DIAGNOSIS — E876 Hypokalemia: Secondary | ICD-10-CM | POA: Diagnosis not present

## 2021-08-26 DIAGNOSIS — I10 Essential (primary) hypertension: Secondary | ICD-10-CM | POA: Diagnosis not present

## 2021-08-26 DIAGNOSIS — H547 Unspecified visual loss: Secondary | ICD-10-CM | POA: Diagnosis not present

## 2021-08-26 DIAGNOSIS — E1165 Type 2 diabetes mellitus with hyperglycemia: Secondary | ICD-10-CM | POA: Diagnosis not present

## 2021-08-26 DIAGNOSIS — W19XXXD Unspecified fall, subsequent encounter: Secondary | ICD-10-CM | POA: Diagnosis not present

## 2021-08-26 DIAGNOSIS — U071 COVID-19: Secondary | ICD-10-CM | POA: Diagnosis not present

## 2021-08-26 DIAGNOSIS — J449 Chronic obstructive pulmonary disease, unspecified: Secondary | ICD-10-CM | POA: Diagnosis not present

## 2021-08-26 DIAGNOSIS — I2510 Atherosclerotic heart disease of native coronary artery without angina pectoris: Secondary | ICD-10-CM | POA: Diagnosis not present

## 2021-08-26 DIAGNOSIS — E1140 Type 2 diabetes mellitus with diabetic neuropathy, unspecified: Secondary | ICD-10-CM | POA: Diagnosis not present

## 2021-08-26 DIAGNOSIS — S82841D Displaced bimalleolar fracture of right lower leg, subsequent encounter for closed fracture with routine healing: Secondary | ICD-10-CM | POA: Diagnosis not present

## 2021-08-27 DIAGNOSIS — Z79899 Other long term (current) drug therapy: Secondary | ICD-10-CM | POA: Diagnosis not present

## 2021-09-01 DIAGNOSIS — I129 Hypertensive chronic kidney disease with stage 1 through stage 4 chronic kidney disease, or unspecified chronic kidney disease: Secondary | ICD-10-CM | POA: Diagnosis not present

## 2021-09-01 DIAGNOSIS — U071 COVID-19: Secondary | ICD-10-CM | POA: Diagnosis not present

## 2021-09-01 DIAGNOSIS — S82841D Displaced bimalleolar fracture of right lower leg, subsequent encounter for closed fracture with routine healing: Secondary | ICD-10-CM | POA: Diagnosis not present

## 2021-09-01 DIAGNOSIS — E039 Hypothyroidism, unspecified: Secondary | ICD-10-CM | POA: Diagnosis not present

## 2021-09-01 DIAGNOSIS — E876 Hypokalemia: Secondary | ICD-10-CM | POA: Diagnosis not present

## 2021-09-01 DIAGNOSIS — J449 Chronic obstructive pulmonary disease, unspecified: Secondary | ICD-10-CM | POA: Diagnosis not present

## 2021-09-01 DIAGNOSIS — I10 Essential (primary) hypertension: Secondary | ICD-10-CM | POA: Diagnosis not present

## 2021-09-01 DIAGNOSIS — K219 Gastro-esophageal reflux disease without esophagitis: Secondary | ICD-10-CM | POA: Diagnosis not present

## 2021-09-01 DIAGNOSIS — E785 Hyperlipidemia, unspecified: Secondary | ICD-10-CM | POA: Diagnosis not present

## 2021-09-01 DIAGNOSIS — H547 Unspecified visual loss: Secondary | ICD-10-CM | POA: Diagnosis not present

## 2021-09-01 DIAGNOSIS — D649 Anemia, unspecified: Secondary | ICD-10-CM | POA: Diagnosis not present

## 2021-09-01 DIAGNOSIS — M5432 Sciatica, left side: Secondary | ICD-10-CM | POA: Diagnosis not present

## 2021-09-01 DIAGNOSIS — E1140 Type 2 diabetes mellitus with diabetic neuropathy, unspecified: Secondary | ICD-10-CM | POA: Diagnosis not present

## 2021-09-01 DIAGNOSIS — G2581 Restless legs syndrome: Secondary | ICD-10-CM | POA: Diagnosis not present

## 2021-09-01 DIAGNOSIS — K589 Irritable bowel syndrome without diarrhea: Secondary | ICD-10-CM | POA: Diagnosis not present

## 2021-09-01 DIAGNOSIS — I2510 Atherosclerotic heart disease of native coronary artery without angina pectoris: Secondary | ICD-10-CM | POA: Diagnosis not present

## 2021-09-01 DIAGNOSIS — W19XXXD Unspecified fall, subsequent encounter: Secondary | ICD-10-CM | POA: Diagnosis not present

## 2021-09-01 DIAGNOSIS — E1165 Type 2 diabetes mellitus with hyperglycemia: Secondary | ICD-10-CM | POA: Diagnosis not present

## 2021-09-01 DIAGNOSIS — E119 Type 2 diabetes mellitus without complications: Secondary | ICD-10-CM | POA: Diagnosis not present

## 2021-09-02 DIAGNOSIS — E038 Other specified hypothyroidism: Secondary | ICD-10-CM | POA: Diagnosis not present

## 2021-09-02 DIAGNOSIS — I2510 Atherosclerotic heart disease of native coronary artery without angina pectoris: Secondary | ICD-10-CM | POA: Diagnosis not present

## 2021-09-02 DIAGNOSIS — E1165 Type 2 diabetes mellitus with hyperglycemia: Secondary | ICD-10-CM | POA: Diagnosis not present

## 2021-09-02 DIAGNOSIS — E1100 Type 2 diabetes mellitus with hyperosmolarity without nonketotic hyperglycemic-hyperosmolar coma (NKHHC): Secondary | ICD-10-CM | POA: Diagnosis not present

## 2021-09-02 DIAGNOSIS — I5030 Unspecified diastolic (congestive) heart failure: Secondary | ICD-10-CM | POA: Diagnosis not present

## 2021-09-02 DIAGNOSIS — E785 Hyperlipidemia, unspecified: Secondary | ICD-10-CM | POA: Diagnosis not present

## 2021-09-02 DIAGNOSIS — J449 Chronic obstructive pulmonary disease, unspecified: Secondary | ICD-10-CM | POA: Diagnosis not present

## 2021-09-02 DIAGNOSIS — D518 Other vitamin B12 deficiency anemias: Secondary | ICD-10-CM | POA: Diagnosis not present

## 2021-09-02 DIAGNOSIS — I10 Essential (primary) hypertension: Secondary | ICD-10-CM | POA: Diagnosis not present

## 2021-09-02 DIAGNOSIS — E119 Type 2 diabetes mellitus without complications: Secondary | ICD-10-CM | POA: Diagnosis not present

## 2021-09-02 DIAGNOSIS — E559 Vitamin D deficiency, unspecified: Secondary | ICD-10-CM | POA: Diagnosis not present

## 2021-09-09 DIAGNOSIS — E1159 Type 2 diabetes mellitus with other circulatory complications: Secondary | ICD-10-CM | POA: Diagnosis not present

## 2021-09-15 ENCOUNTER — Other Ambulatory Visit (INDEPENDENT_AMBULATORY_CARE_PROVIDER_SITE_OTHER): Payer: Self-pay | Admitting: *Deleted

## 2021-09-15 ENCOUNTER — Encounter: Payer: Medicare Other | Admitting: Orthopedic Surgery

## 2021-09-15 DIAGNOSIS — K754 Autoimmune hepatitis: Secondary | ICD-10-CM

## 2021-09-16 DIAGNOSIS — E7849 Other hyperlipidemia: Secondary | ICD-10-CM | POA: Diagnosis not present

## 2021-09-16 DIAGNOSIS — E119 Type 2 diabetes mellitus without complications: Secondary | ICD-10-CM | POA: Diagnosis not present

## 2021-09-16 DIAGNOSIS — E559 Vitamin D deficiency, unspecified: Secondary | ICD-10-CM | POA: Diagnosis not present

## 2021-09-16 DIAGNOSIS — Z79899 Other long term (current) drug therapy: Secondary | ICD-10-CM | POA: Diagnosis not present

## 2021-09-16 DIAGNOSIS — D518 Other vitamin B12 deficiency anemias: Secondary | ICD-10-CM | POA: Diagnosis not present

## 2021-09-18 DIAGNOSIS — I2510 Atherosclerotic heart disease of native coronary artery without angina pectoris: Secondary | ICD-10-CM | POA: Diagnosis not present

## 2021-09-18 DIAGNOSIS — S82841D Displaced bimalleolar fracture of right lower leg, subsequent encounter for closed fracture with routine healing: Secondary | ICD-10-CM | POA: Diagnosis not present

## 2021-09-18 DIAGNOSIS — E1165 Type 2 diabetes mellitus with hyperglycemia: Secondary | ICD-10-CM | POA: Diagnosis not present

## 2021-09-18 DIAGNOSIS — H547 Unspecified visual loss: Secondary | ICD-10-CM | POA: Diagnosis not present

## 2021-09-18 DIAGNOSIS — J449 Chronic obstructive pulmonary disease, unspecified: Secondary | ICD-10-CM | POA: Diagnosis not present

## 2021-09-18 DIAGNOSIS — E876 Hypokalemia: Secondary | ICD-10-CM | POA: Diagnosis not present

## 2021-09-18 DIAGNOSIS — W19XXXD Unspecified fall, subsequent encounter: Secondary | ICD-10-CM | POA: Diagnosis not present

## 2021-09-18 DIAGNOSIS — E1140 Type 2 diabetes mellitus with diabetic neuropathy, unspecified: Secondary | ICD-10-CM | POA: Diagnosis not present

## 2021-09-18 DIAGNOSIS — I10 Essential (primary) hypertension: Secondary | ICD-10-CM | POA: Diagnosis not present

## 2021-09-22 ENCOUNTER — Ambulatory Visit (INDEPENDENT_AMBULATORY_CARE_PROVIDER_SITE_OTHER): Payer: Medicare Other | Admitting: Orthopedic Surgery

## 2021-09-22 ENCOUNTER — Encounter: Payer: Self-pay | Admitting: Orthopedic Surgery

## 2021-09-22 ENCOUNTER — Ambulatory Visit (INDEPENDENT_AMBULATORY_CARE_PROVIDER_SITE_OTHER): Payer: Medicare Other

## 2021-09-22 DIAGNOSIS — S82891D Other fracture of right lower leg, subsequent encounter for closed fracture with routine healing: Secondary | ICD-10-CM

## 2021-09-22 NOTE — Progress Notes (Signed)
/  Orthopaedic Postop Note ? ?Assessment: ?[name] is a 76 y.o. female s/p ORIF of Right ankle fracture ? ?DOS: 07/09/21 ? ?Plan: ?[name] is doing great.  Radiographs remained stable.  She has no pain in her right ankle, but does have some pain in the plantar aspect of the right foot.  She can now transition out of her walking boot.  Continue with weightbearing as tolerated.  Continue to work with therapy is much as tolerated.  Follow-up in about 3 months. ? ?Follow-up: ?Return in about 3 months (around 12/22/2021). ?XR at next visit: Right ankle ? ?Subjective: ? ?Chief Complaint  ?Patient presents with  ? Ankle Injury  ?  Fx care/ RT ankle ?DOI 07/09/21 ?Pt states the boot is causing pain and rubbing all around her ankle. She wears the boot from around 8am-5pm  ? ? ?History of Present Illness: ?[name] is a 76 y.o. female who presents following the above stated procedure.  Surgery was almost 3 months ago.  She continues to improve.  No issues with the incisions.  She continues to use the walking boot.  She has taken a few steps without immobilization.  She notes some discomfort in the plantar aspect of her right foot. ? ?Review of Systems: ?No fevers or chills ?No numbness or tingling ?No Chest Pain ?No shortness of breath ? ? ?Objective: ? ?Physical Exam: ? ?Alert and oriented.  No acute distress.   ? ?Right ankle with well-healed surgical incisions.  No surrounding erythema or drainage.  No tenderness to palpation over the incisions both medially and laterally.  She has some discomfort over the spring ligament and the plantar foot.  She is able to get approximately 5 degrees of dorsiflexion in her right ankle.  Toes are warm and well-perfused. ? ?IMAGING: ?I personally ordered and reviewed the following images ? ?X-rays of the right ankle were obtained in clinic today.  These were compared to prior x-rays.  There has been no interval subsidence or shifting of the fracture, or the hardware.   Tibiotalar joint is reduced.  No hardware failure or subsidence.  Mortise remains congruent.  No syndesmotic disruption. ? ?Impression: Right ankle x-ray following operative fixation, without hardware failure. ? ? ?Hannah Rasmussen, MD ?09/22/2021 ?11:07 PM ? ? ?

## 2021-09-23 DIAGNOSIS — E876 Hypokalemia: Secondary | ICD-10-CM | POA: Diagnosis not present

## 2021-09-23 DIAGNOSIS — I2510 Atherosclerotic heart disease of native coronary artery without angina pectoris: Secondary | ICD-10-CM | POA: Diagnosis not present

## 2021-09-23 DIAGNOSIS — H547 Unspecified visual loss: Secondary | ICD-10-CM | POA: Diagnosis not present

## 2021-09-23 DIAGNOSIS — E1165 Type 2 diabetes mellitus with hyperglycemia: Secondary | ICD-10-CM | POA: Diagnosis not present

## 2021-09-23 DIAGNOSIS — E1140 Type 2 diabetes mellitus with diabetic neuropathy, unspecified: Secondary | ICD-10-CM | POA: Diagnosis not present

## 2021-09-23 DIAGNOSIS — J449 Chronic obstructive pulmonary disease, unspecified: Secondary | ICD-10-CM | POA: Diagnosis not present

## 2021-09-23 DIAGNOSIS — W19XXXD Unspecified fall, subsequent encounter: Secondary | ICD-10-CM | POA: Diagnosis not present

## 2021-09-23 DIAGNOSIS — I10 Essential (primary) hypertension: Secondary | ICD-10-CM | POA: Diagnosis not present

## 2021-09-23 DIAGNOSIS — S82841D Displaced bimalleolar fracture of right lower leg, subsequent encounter for closed fracture with routine healing: Secondary | ICD-10-CM | POA: Diagnosis not present

## 2021-09-24 DIAGNOSIS — R7989 Other specified abnormal findings of blood chemistry: Secondary | ICD-10-CM | POA: Diagnosis not present

## 2021-09-24 DIAGNOSIS — Z79899 Other long term (current) drug therapy: Secondary | ICD-10-CM | POA: Diagnosis not present

## 2021-09-25 DIAGNOSIS — E1140 Type 2 diabetes mellitus with diabetic neuropathy, unspecified: Secondary | ICD-10-CM | POA: Diagnosis not present

## 2021-09-25 DIAGNOSIS — I2510 Atherosclerotic heart disease of native coronary artery without angina pectoris: Secondary | ICD-10-CM | POA: Diagnosis not present

## 2021-09-25 DIAGNOSIS — J449 Chronic obstructive pulmonary disease, unspecified: Secondary | ICD-10-CM | POA: Diagnosis not present

## 2021-09-25 DIAGNOSIS — E1165 Type 2 diabetes mellitus with hyperglycemia: Secondary | ICD-10-CM | POA: Diagnosis not present

## 2021-09-25 DIAGNOSIS — E876 Hypokalemia: Secondary | ICD-10-CM | POA: Diagnosis not present

## 2021-09-25 DIAGNOSIS — H547 Unspecified visual loss: Secondary | ICD-10-CM | POA: Diagnosis not present

## 2021-09-25 DIAGNOSIS — I10 Essential (primary) hypertension: Secondary | ICD-10-CM | POA: Diagnosis not present

## 2021-09-25 DIAGNOSIS — S82841D Displaced bimalleolar fracture of right lower leg, subsequent encounter for closed fracture with routine healing: Secondary | ICD-10-CM | POA: Diagnosis not present

## 2021-09-25 DIAGNOSIS — W19XXXD Unspecified fall, subsequent encounter: Secondary | ICD-10-CM | POA: Diagnosis not present

## 2021-09-29 ENCOUNTER — Other Ambulatory Visit (INDEPENDENT_AMBULATORY_CARE_PROVIDER_SITE_OTHER): Payer: Self-pay | Admitting: *Deleted

## 2021-09-29 DIAGNOSIS — K754 Autoimmune hepatitis: Secondary | ICD-10-CM

## 2021-09-29 DIAGNOSIS — D803 Selective deficiency of immunoglobulin G [IgG] subclasses: Secondary | ICD-10-CM | POA: Diagnosis not present

## 2021-09-29 DIAGNOSIS — Z79899 Other long term (current) drug therapy: Secondary | ICD-10-CM | POA: Diagnosis not present

## 2021-09-30 ENCOUNTER — Other Ambulatory Visit (INDEPENDENT_AMBULATORY_CARE_PROVIDER_SITE_OTHER): Payer: Self-pay | Admitting: *Deleted

## 2021-09-30 ENCOUNTER — Telehealth (INDEPENDENT_AMBULATORY_CARE_PROVIDER_SITE_OTHER): Payer: Self-pay | Admitting: *Deleted

## 2021-09-30 DIAGNOSIS — E876 Hypokalemia: Secondary | ICD-10-CM | POA: Diagnosis not present

## 2021-09-30 DIAGNOSIS — I2510 Atherosclerotic heart disease of native coronary artery without angina pectoris: Secondary | ICD-10-CM | POA: Diagnosis not present

## 2021-09-30 DIAGNOSIS — K754 Autoimmune hepatitis: Secondary | ICD-10-CM

## 2021-09-30 DIAGNOSIS — W19XXXD Unspecified fall, subsequent encounter: Secondary | ICD-10-CM | POA: Diagnosis not present

## 2021-09-30 DIAGNOSIS — S82841D Displaced bimalleolar fracture of right lower leg, subsequent encounter for closed fracture with routine healing: Secondary | ICD-10-CM | POA: Diagnosis not present

## 2021-09-30 DIAGNOSIS — H547 Unspecified visual loss: Secondary | ICD-10-CM | POA: Diagnosis not present

## 2021-09-30 DIAGNOSIS — E1165 Type 2 diabetes mellitus with hyperglycemia: Secondary | ICD-10-CM | POA: Diagnosis not present

## 2021-09-30 DIAGNOSIS — E1140 Type 2 diabetes mellitus with diabetic neuropathy, unspecified: Secondary | ICD-10-CM | POA: Diagnosis not present

## 2021-09-30 DIAGNOSIS — I10 Essential (primary) hypertension: Secondary | ICD-10-CM | POA: Diagnosis not present

## 2021-09-30 DIAGNOSIS — J449 Chronic obstructive pulmonary disease, unspecified: Secondary | ICD-10-CM | POA: Diagnosis not present

## 2021-09-30 MED ORDER — AZATHIOPRINE 50 MG PO TABS: ORAL_TABLET | ORAL | 0 refills | Status: DC

## 2021-09-30 NOTE — Telephone Encounter (Signed)
Dr. Laural Golden reviewed labs( done on 09/24/21) faxed over on patient and we still need lab results for IgG - we received IgE again instead of IgG. Per dr Laural Golden stay on prednisone '15mg'$ . Do not taper anymore. Repeat cbc with diff and Lft's in 2 weeks. Increase Imuran from '50mg'$  to '75mg'$  daily.  ?

## 2021-09-30 NOTE — Telephone Encounter (Signed)
Tried calling multiple times and no answer. Was able to leave message to return call on manager's voicemail on the last call. I sent rx with new directions for imuran to [facility].  ?

## 2021-10-01 ENCOUNTER — Other Ambulatory Visit (INDEPENDENT_AMBULATORY_CARE_PROVIDER_SITE_OTHER): Payer: Self-pay | Admitting: *Deleted

## 2021-10-01 DIAGNOSIS — K219 Gastro-esophageal reflux disease without esophagitis: Secondary | ICD-10-CM | POA: Diagnosis not present

## 2021-10-01 DIAGNOSIS — K589 Irritable bowel syndrome without diarrhea: Secondary | ICD-10-CM | POA: Diagnosis not present

## 2021-10-01 DIAGNOSIS — E785 Hyperlipidemia, unspecified: Secondary | ICD-10-CM | POA: Diagnosis not present

## 2021-10-01 DIAGNOSIS — M545 Low back pain, unspecified: Secondary | ICD-10-CM | POA: Diagnosis not present

## 2021-10-01 DIAGNOSIS — E119 Type 2 diabetes mellitus without complications: Secondary | ICD-10-CM | POA: Diagnosis not present

## 2021-10-01 DIAGNOSIS — K754 Autoimmune hepatitis: Secondary | ICD-10-CM

## 2021-10-01 DIAGNOSIS — E039 Hypothyroidism, unspecified: Secondary | ICD-10-CM | POA: Diagnosis not present

## 2021-10-01 DIAGNOSIS — R5381 Other malaise: Secondary | ICD-10-CM | POA: Diagnosis not present

## 2021-10-01 DIAGNOSIS — I5030 Unspecified diastolic (congestive) heart failure: Secondary | ICD-10-CM | POA: Diagnosis not present

## 2021-10-01 DIAGNOSIS — H547 Unspecified visual loss: Secondary | ICD-10-CM | POA: Diagnosis not present

## 2021-10-01 MED ORDER — PREDNISONE 5 MG PO TABS: 15.0000 mg | ORAL_TABLET | Freq: Every day | ORAL | 0 refills | Status: DC

## 2021-10-01 MED ORDER — AZATHIOPRINE 50 MG PO TABS: ORAL_TABLET | ORAL | 0 refills | Status: DC

## 2021-10-01 NOTE — Telephone Encounter (Signed)
No return call back. I called again and spoke with Pam. She did send results of IGG and I will give to Dr. Laural Golden for review and I gave new med orders and sent to pharmacy.  ?

## 2021-10-02 NOTE — Telephone Encounter (Signed)
New directions on med orders faxed to [facility]. Needs cbc and LFT's in 2 weeks and IgG in 3 months. Added to reminder file.  ?

## 2021-10-08 DIAGNOSIS — I10 Essential (primary) hypertension: Secondary | ICD-10-CM | POA: Diagnosis not present

## 2021-10-08 DIAGNOSIS — W19XXXD Unspecified fall, subsequent encounter: Secondary | ICD-10-CM | POA: Diagnosis not present

## 2021-10-08 DIAGNOSIS — J449 Chronic obstructive pulmonary disease, unspecified: Secondary | ICD-10-CM | POA: Diagnosis not present

## 2021-10-08 DIAGNOSIS — I2510 Atherosclerotic heart disease of native coronary artery without angina pectoris: Secondary | ICD-10-CM | POA: Diagnosis not present

## 2021-10-08 DIAGNOSIS — S82841D Displaced bimalleolar fracture of right lower leg, subsequent encounter for closed fracture with routine healing: Secondary | ICD-10-CM | POA: Diagnosis not present

## 2021-10-08 DIAGNOSIS — E1165 Type 2 diabetes mellitus with hyperglycemia: Secondary | ICD-10-CM | POA: Diagnosis not present

## 2021-10-08 DIAGNOSIS — E876 Hypokalemia: Secondary | ICD-10-CM | POA: Diagnosis not present

## 2021-10-08 DIAGNOSIS — E1140 Type 2 diabetes mellitus with diabetic neuropathy, unspecified: Secondary | ICD-10-CM | POA: Diagnosis not present

## 2021-10-08 DIAGNOSIS — H547 Unspecified visual loss: Secondary | ICD-10-CM | POA: Diagnosis not present

## 2021-10-09 ENCOUNTER — Other Ambulatory Visit (INDEPENDENT_AMBULATORY_CARE_PROVIDER_SITE_OTHER): Payer: Self-pay | Admitting: *Deleted

## 2021-10-09 DIAGNOSIS — D518 Other vitamin B12 deficiency anemias: Secondary | ICD-10-CM | POA: Diagnosis not present

## 2021-10-09 DIAGNOSIS — R7989 Other specified abnormal findings of blood chemistry: Secondary | ICD-10-CM

## 2021-10-09 DIAGNOSIS — J449 Chronic obstructive pulmonary disease, unspecified: Secondary | ICD-10-CM | POA: Diagnosis not present

## 2021-10-09 DIAGNOSIS — I10 Essential (primary) hypertension: Secondary | ICD-10-CM | POA: Diagnosis not present

## 2021-10-09 DIAGNOSIS — E038 Other specified hypothyroidism: Secondary | ICD-10-CM | POA: Diagnosis not present

## 2021-10-09 DIAGNOSIS — I5030 Unspecified diastolic (congestive) heart failure: Secondary | ICD-10-CM | POA: Diagnosis not present

## 2021-10-09 DIAGNOSIS — E1165 Type 2 diabetes mellitus with hyperglycemia: Secondary | ICD-10-CM | POA: Diagnosis not present

## 2021-10-09 DIAGNOSIS — E559 Vitamin D deficiency, unspecified: Secondary | ICD-10-CM | POA: Diagnosis not present

## 2021-10-09 DIAGNOSIS — I129 Hypertensive chronic kidney disease with stage 1 through stage 4 chronic kidney disease, or unspecified chronic kidney disease: Secondary | ICD-10-CM | POA: Diagnosis not present

## 2021-10-09 DIAGNOSIS — K754 Autoimmune hepatitis: Secondary | ICD-10-CM

## 2021-10-09 DIAGNOSIS — E119 Type 2 diabetes mellitus without complications: Secondary | ICD-10-CM | POA: Diagnosis not present

## 2021-10-15 DIAGNOSIS — R300 Dysuria: Secondary | ICD-10-CM | POA: Diagnosis not present

## 2021-10-15 DIAGNOSIS — Z79899 Other long term (current) drug therapy: Secondary | ICD-10-CM | POA: Diagnosis not present

## 2021-10-15 DIAGNOSIS — R945 Abnormal results of liver function studies: Secondary | ICD-10-CM | POA: Diagnosis not present

## 2021-10-19 ENCOUNTER — Encounter: Payer: Self-pay | Admitting: *Deleted

## 2021-10-19 DIAGNOSIS — Z79899 Other long term (current) drug therapy: Secondary | ICD-10-CM | POA: Diagnosis not present

## 2021-10-19 DIAGNOSIS — N390 Urinary tract infection, site not specified: Secondary | ICD-10-CM | POA: Diagnosis not present

## 2021-10-20 ENCOUNTER — Ambulatory Visit: Payer: Medicare Other | Admitting: Cardiology
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
## 2021-10-21 ENCOUNTER — Telehealth (INDEPENDENT_AMBULATORY_CARE_PROVIDER_SITE_OTHER): Payer: Self-pay | Admitting: *Deleted

## 2021-10-21 NOTE — Telephone Encounter (Signed)
Discussed with pam at [facility] that per dr Laural Golden numbers are better and asked Pam if she was ambulatory and she states she does not walk at all. And states she is not losing weight. The last weight she has on her was on 10/03/21 and it was 195 lbs and the last weight in chart at office visit was 195 lbs on 08/20/21.

## 2021-10-21 NOTE — Telephone Encounter (Signed)
Dr. Laural Golden reviewed labs sent over by [facility]. States numbers are better. He wants to know is she ambulatory? And is she losing weight? Repeat LFT's in one month.  I tried to call [facility] twice and no answer. Will try back later today.

## 2021-10-21 NOTE — Telephone Encounter (Signed)
Patient added to reminder file to do LFT's in one month.

## 2021-10-23 NOTE — Telephone Encounter (Signed)
Dr. Laural Golden notified of pt's weight and that she is not ambulatory. He wants to recheck LFT's in one month.

## 2021-10-25 DIAGNOSIS — I10 Essential (primary) hypertension: Secondary | ICD-10-CM | POA: Diagnosis not present

## 2021-10-27 DIAGNOSIS — R590 Localized enlarged lymph nodes: Secondary | ICD-10-CM | POA: Diagnosis not present

## 2021-10-27 DIAGNOSIS — H6690 Otitis media, unspecified, unspecified ear: Secondary | ICD-10-CM | POA: Diagnosis not present

## 2021-11-03 DIAGNOSIS — E785 Hyperlipidemia, unspecified: Secondary | ICD-10-CM | POA: Diagnosis not present

## 2021-11-03 DIAGNOSIS — H547 Unspecified visual loss: Secondary | ICD-10-CM | POA: Diagnosis not present

## 2021-11-03 DIAGNOSIS — E039 Hypothyroidism, unspecified: Secondary | ICD-10-CM | POA: Diagnosis not present

## 2021-11-03 DIAGNOSIS — J449 Chronic obstructive pulmonary disease, unspecified: Secondary | ICD-10-CM | POA: Diagnosis not present

## 2021-11-03 DIAGNOSIS — K219 Gastro-esophageal reflux disease without esophagitis: Secondary | ICD-10-CM | POA: Diagnosis not present

## 2021-11-03 DIAGNOSIS — I10 Essential (primary) hypertension: Secondary | ICD-10-CM | POA: Diagnosis not present

## 2021-11-03 DIAGNOSIS — K589 Irritable bowel syndrome without diarrhea: Secondary | ICD-10-CM | POA: Diagnosis not present

## 2021-11-03 DIAGNOSIS — N189 Chronic kidney disease, unspecified: Secondary | ICD-10-CM | POA: Diagnosis not present

## 2021-11-13 ENCOUNTER — Other Ambulatory Visit (INDEPENDENT_AMBULATORY_CARE_PROVIDER_SITE_OTHER): Payer: Self-pay | Admitting: *Deleted

## 2021-11-13 DIAGNOSIS — K754 Autoimmune hepatitis: Secondary | ICD-10-CM

## 2021-11-13 DIAGNOSIS — R7989 Other specified abnormal findings of blood chemistry: Secondary | ICD-10-CM

## 2021-11-23 DIAGNOSIS — I10 Essential (primary) hypertension: Secondary | ICD-10-CM | POA: Diagnosis not present

## 2021-11-23 DIAGNOSIS — E785 Hyperlipidemia, unspecified: Secondary | ICD-10-CM | POA: Diagnosis not present

## 2021-11-23 DIAGNOSIS — I2510 Atherosclerotic heart disease of native coronary artery without angina pectoris: Secondary | ICD-10-CM | POA: Diagnosis not present

## 2021-11-23 DIAGNOSIS — E1165 Type 2 diabetes mellitus with hyperglycemia: Secondary | ICD-10-CM | POA: Diagnosis not present

## 2021-11-23 DIAGNOSIS — D518 Other vitamin B12 deficiency anemias: Secondary | ICD-10-CM | POA: Diagnosis not present

## 2021-11-23 DIAGNOSIS — E559 Vitamin D deficiency, unspecified: Secondary | ICD-10-CM | POA: Diagnosis not present

## 2021-11-23 DIAGNOSIS — E119 Type 2 diabetes mellitus without complications: Secondary | ICD-10-CM | POA: Diagnosis not present

## 2021-11-23 DIAGNOSIS — J449 Chronic obstructive pulmonary disease, unspecified: Secondary | ICD-10-CM | POA: Diagnosis not present

## 2021-11-23 DIAGNOSIS — N189 Chronic kidney disease, unspecified: Secondary | ICD-10-CM | POA: Diagnosis not present

## 2021-11-23 DIAGNOSIS — E038 Other specified hypothyroidism: Secondary | ICD-10-CM | POA: Diagnosis not present

## 2021-11-25 DIAGNOSIS — E1165 Type 2 diabetes mellitus with hyperglycemia: Secondary | ICD-10-CM | POA: Diagnosis not present

## 2021-11-26 ENCOUNTER — Encounter (INDEPENDENT_AMBULATORY_CARE_PROVIDER_SITE_OTHER): Payer: Self-pay | Admitting: Gastroenterology

## 2021-11-26 DIAGNOSIS — R945 Abnormal results of liver function studies: Secondary | ICD-10-CM | POA: Diagnosis not present

## 2021-11-26 DIAGNOSIS — Z79899 Other long term (current) drug therapy: Secondary | ICD-10-CM | POA: Diagnosis not present

## 2021-12-02 ENCOUNTER — Telehealth: Payer: Self-pay | Admitting: *Deleted

## 2021-12-02 NOTE — Telephone Encounter (Signed)
Received results of LFT from [facility]. On your desk for review. Her next appt is with you on 03/08/22.

## 2021-12-02 NOTE — Telephone Encounter (Signed)
Dr. Laural Golden wanted repeat liver function test around 11/20/21. I faxed over order for lab to [facility] on 11/16/21. I called to get results on 6/27 and was told it would be faxed. I did not receive fax. I called back on 6/30 and was told results would be faxed over. I still do not have results. I called back today and had to leave a voicemail on administer's vm and said on vm that I have called twice previously and still do not have results and we need results faxed over.

## 2021-12-08 DIAGNOSIS — E039 Hypothyroidism, unspecified: Secondary | ICD-10-CM | POA: Diagnosis not present

## 2021-12-08 DIAGNOSIS — D649 Anemia, unspecified: Secondary | ICD-10-CM | POA: Diagnosis not present

## 2021-12-08 DIAGNOSIS — E559 Vitamin D deficiency, unspecified: Secondary | ICD-10-CM | POA: Diagnosis not present

## 2021-12-08 DIAGNOSIS — I5030 Unspecified diastolic (congestive) heart failure: Secondary | ICD-10-CM | POA: Diagnosis not present

## 2021-12-08 DIAGNOSIS — J449 Chronic obstructive pulmonary disease, unspecified: Secondary | ICD-10-CM | POA: Diagnosis not present

## 2021-12-08 DIAGNOSIS — I2510 Atherosclerotic heart disease of native coronary artery without angina pectoris: Secondary | ICD-10-CM | POA: Diagnosis not present

## 2021-12-08 DIAGNOSIS — G8929 Other chronic pain: Secondary | ICD-10-CM | POA: Diagnosis not present

## 2021-12-08 DIAGNOSIS — E119 Type 2 diabetes mellitus without complications: Secondary | ICD-10-CM | POA: Diagnosis not present

## 2021-12-08 DIAGNOSIS — E785 Hyperlipidemia, unspecified: Secondary | ICD-10-CM | POA: Diagnosis not present

## 2021-12-11 DIAGNOSIS — N189 Chronic kidney disease, unspecified: Secondary | ICD-10-CM | POA: Diagnosis not present

## 2021-12-11 DIAGNOSIS — E119 Type 2 diabetes mellitus without complications: Secondary | ICD-10-CM | POA: Diagnosis not present

## 2021-12-11 DIAGNOSIS — E785 Hyperlipidemia, unspecified: Secondary | ICD-10-CM | POA: Diagnosis not present

## 2021-12-11 DIAGNOSIS — J449 Chronic obstructive pulmonary disease, unspecified: Secondary | ICD-10-CM | POA: Diagnosis not present

## 2021-12-11 DIAGNOSIS — D518 Other vitamin B12 deficiency anemias: Secondary | ICD-10-CM | POA: Diagnosis not present

## 2021-12-11 DIAGNOSIS — E559 Vitamin D deficiency, unspecified: Secondary | ICD-10-CM | POA: Diagnosis not present

## 2021-12-11 DIAGNOSIS — E038 Other specified hypothyroidism: Secondary | ICD-10-CM | POA: Diagnosis not present

## 2021-12-11 DIAGNOSIS — I10 Essential (primary) hypertension: Secondary | ICD-10-CM | POA: Diagnosis not present

## 2021-12-22 ENCOUNTER — Ambulatory Visit: Payer: Medicare Other | Admitting: Orthopedic Surgery

## 2021-12-22 DIAGNOSIS — E039 Hypothyroidism, unspecified: Secondary | ICD-10-CM | POA: Diagnosis not present

## 2021-12-22 DIAGNOSIS — M79604 Pain in right leg: Secondary | ICD-10-CM | POA: Diagnosis not present

## 2021-12-22 DIAGNOSIS — G4700 Insomnia, unspecified: Secondary | ICD-10-CM | POA: Diagnosis not present

## 2021-12-22 DIAGNOSIS — Z0189 Encounter for other specified special examinations: Secondary | ICD-10-CM | POA: Diagnosis not present

## 2021-12-22 DIAGNOSIS — M069 Rheumatoid arthritis, unspecified: Secondary | ICD-10-CM | POA: Diagnosis not present

## 2021-12-22 DIAGNOSIS — K219 Gastro-esophageal reflux disease without esophagitis: Secondary | ICD-10-CM | POA: Diagnosis not present

## 2021-12-22 DIAGNOSIS — I10 Essential (primary) hypertension: Secondary | ICD-10-CM | POA: Diagnosis not present

## 2021-12-22 DIAGNOSIS — H540X45 Blindness right eye category 4, blindness left eye category 5: Secondary | ICD-10-CM | POA: Diagnosis not present

## 2021-12-22 DIAGNOSIS — E1121 Type 2 diabetes mellitus with diabetic nephropathy: Secondary | ICD-10-CM | POA: Diagnosis not present

## 2021-12-25 DIAGNOSIS — R011 Cardiac murmur, unspecified: Secondary | ICD-10-CM | POA: Diagnosis not present

## 2021-12-26 DIAGNOSIS — E1165 Type 2 diabetes mellitus with hyperglycemia: Secondary | ICD-10-CM | POA: Diagnosis not present

## 2021-12-29 ENCOUNTER — Encounter: Payer: Self-pay | Admitting: Cardiology

## 2021-12-29 ENCOUNTER — Ambulatory Visit (INDEPENDENT_AMBULATORY_CARE_PROVIDER_SITE_OTHER): Payer: Medicare Other | Admitting: Orthopedic Surgery

## 2021-12-29 ENCOUNTER — Ambulatory Visit (INDEPENDENT_AMBULATORY_CARE_PROVIDER_SITE_OTHER): Payer: Medicare Other

## 2021-12-29 ENCOUNTER — Ambulatory Visit (INDEPENDENT_AMBULATORY_CARE_PROVIDER_SITE_OTHER): Payer: Medicare Other | Admitting: Cardiology

## 2021-12-29 ENCOUNTER — Encounter: Payer: Self-pay | Admitting: Orthopedic Surgery

## 2021-12-29 VITALS — BP 108/72 | HR 73 | Ht 61.0 in | Wt 190.0 lb

## 2021-12-29 DIAGNOSIS — S82891D Other fracture of right lower leg, subsequent encounter for closed fracture with routine healing: Secondary | ICD-10-CM | POA: Diagnosis not present

## 2021-12-29 DIAGNOSIS — I10 Essential (primary) hypertension: Secondary | ICD-10-CM

## 2021-12-29 DIAGNOSIS — I2510 Atherosclerotic heart disease of native coronary artery without angina pectoris: Secondary | ICD-10-CM | POA: Diagnosis not present

## 2021-12-29 DIAGNOSIS — E782 Mixed hyperlipidemia: Secondary | ICD-10-CM | POA: Diagnosis not present

## 2021-12-29 MED ORDER — EZETIMIBE 10 MG PO TABS: 10.0000 mg | ORAL_TABLET | Freq: Every day | ORAL | 3 refills | Status: DC
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
## 2021-12-29 NOTE — Patient Instructions (Signed)
Medication Instructions:  Begin Zetia '10mg'$  daily  Continue all other medications.     Labwork: FLP, LFT - orders given today Please do in 6 weeks (around 02/02/2022) Office will contact with results via phone, letter or mychart.     Testing/Procedures: none  Follow-Up: 6 months   Any Other Special Instructions Will Be Listed Below (If Applicable).   If you need a refill on your cardiac medications before your next appointment, please call your pharmacy.

## 2021-12-29 NOTE — Progress Notes (Unsigned)
G ankle

## 2022-01-05 ENCOUNTER — Telehealth (INDEPENDENT_AMBULATORY_CARE_PROVIDER_SITE_OTHER): Payer: Self-pay | Admitting: *Deleted

## 2022-01-05 ENCOUNTER — Other Ambulatory Visit (INDEPENDENT_AMBULATORY_CARE_PROVIDER_SITE_OTHER): Payer: Self-pay | Admitting: *Deleted

## 2022-01-05 DIAGNOSIS — K754 Autoimmune hepatitis: Secondary | ICD-10-CM

## 2022-01-05 DIAGNOSIS — I6523 Occlusion and stenosis of bilateral carotid arteries: Secondary | ICD-10-CM | POA: Diagnosis not present

## 2022-01-05 DIAGNOSIS — K7581 Nonalcoholic steatohepatitis (NASH): Secondary | ICD-10-CM

## 2022-01-05 DIAGNOSIS — R0989 Other specified symptoms and signs involving the circulatory and respiratory systems: Secondary | ICD-10-CM | POA: Diagnosis not present

## 2022-01-05 NOTE — Telephone Encounter (Signed)
Patient in reminder file for IGg around 01/02/22. Order faxed to [place] to do. Await results.

## 2022-01-11 DIAGNOSIS — K7581 Nonalcoholic steatohepatitis (NASH): Secondary | ICD-10-CM | POA: Diagnosis not present

## 2022-01-11 DIAGNOSIS — K754 Autoimmune hepatitis: Secondary | ICD-10-CM | POA: Diagnosis not present

## 2022-01-12 DIAGNOSIS — E119 Type 2 diabetes mellitus without complications: Secondary | ICD-10-CM | POA: Diagnosis not present

## 2022-01-12 DIAGNOSIS — E559 Vitamin D deficiency, unspecified: Secondary | ICD-10-CM | POA: Diagnosis not present

## 2022-01-12 DIAGNOSIS — E785 Hyperlipidemia, unspecified: Secondary | ICD-10-CM | POA: Diagnosis not present

## 2022-01-12 DIAGNOSIS — I2510 Atherosclerotic heart disease of native coronary artery without angina pectoris: Secondary | ICD-10-CM | POA: Diagnosis not present

## 2022-01-12 DIAGNOSIS — I10 Essential (primary) hypertension: Secondary | ICD-10-CM | POA: Diagnosis not present

## 2022-01-12 DIAGNOSIS — J449 Chronic obstructive pulmonary disease, unspecified: Secondary | ICD-10-CM | POA: Diagnosis not present

## 2022-01-12 DIAGNOSIS — E039 Hypothyroidism, unspecified: Secondary | ICD-10-CM | POA: Diagnosis not present

## 2022-01-12 LAB — IGG: IgG (Immunoglobin G), Serum: 1647 mg/dL — ABNORMAL HIGH (ref 586–1602)

## 2022-01-14 NOTE — Progress Notes (Signed)
Tried to call no answer at [place] - phone number [phone]

## 2022-01-14 NOTE — Telephone Encounter (Signed)
See result note.  

## 2022-01-18 ENCOUNTER — Other Ambulatory Visit (INDEPENDENT_AMBULATORY_CARE_PROVIDER_SITE_OTHER): Payer: Self-pay | Admitting: *Deleted

## 2022-01-18 DIAGNOSIS — K7581 Nonalcoholic steatohepatitis (NASH): Secondary | ICD-10-CM

## 2022-01-18 DIAGNOSIS — R7989 Other specified abnormal findings of blood chemistry: Secondary | ICD-10-CM

## 2022-01-18 DIAGNOSIS — K754 Autoimmune hepatitis: Secondary | ICD-10-CM

## 2022-01-18 MED ORDER — AZATHIOPRINE 50 MG PO TABS: ORAL_TABLET | ORAL | 0 refills | Status: DC

## 2022-01-26 DIAGNOSIS — E1165 Type 2 diabetes mellitus with hyperglycemia: Secondary | ICD-10-CM | POA: Diagnosis not present

## 2022-01-28 DIAGNOSIS — I10 Essential (primary) hypertension: Secondary | ICD-10-CM | POA: Diagnosis not present

## 2022-01-28 DIAGNOSIS — E119 Type 2 diabetes mellitus without complications: Secondary | ICD-10-CM | POA: Diagnosis not present

## 2022-01-28 DIAGNOSIS — E782 Mixed hyperlipidemia: Secondary | ICD-10-CM | POA: Diagnosis not present

## 2022-01-28 DIAGNOSIS — E559 Vitamin D deficiency, unspecified: Secondary | ICD-10-CM | POA: Diagnosis not present

## 2022-01-28 DIAGNOSIS — N189 Chronic kidney disease, unspecified: Secondary | ICD-10-CM | POA: Diagnosis not present

## 2022-01-28 DIAGNOSIS — E038 Other specified hypothyroidism: Secondary | ICD-10-CM | POA: Diagnosis not present

## 2022-01-28 DIAGNOSIS — J449 Chronic obstructive pulmonary disease, unspecified: Secondary | ICD-10-CM | POA: Diagnosis not present

## 2022-01-28 DIAGNOSIS — D518 Other vitamin B12 deficiency anemias: Secondary | ICD-10-CM | POA: Diagnosis not present

## 2022-01-29 DIAGNOSIS — I77811 Abdominal aortic ectasia: Secondary | ICD-10-CM | POA: Diagnosis not present

## 2022-02-05 DIAGNOSIS — I70223 Atherosclerosis of native arteries of extremities with rest pain, bilateral legs: Secondary | ICD-10-CM | POA: Diagnosis not present

## 2022-02-08 DIAGNOSIS — D518 Other vitamin B12 deficiency anemias: Secondary | ICD-10-CM | POA: Diagnosis not present

## 2022-02-08 DIAGNOSIS — E782 Mixed hyperlipidemia: Secondary | ICD-10-CM | POA: Diagnosis not present

## 2022-02-08 DIAGNOSIS — E119 Type 2 diabetes mellitus without complications: Secondary | ICD-10-CM | POA: Diagnosis not present

## 2022-02-08 DIAGNOSIS — E038 Other specified hypothyroidism: Secondary | ICD-10-CM | POA: Diagnosis not present

## 2022-02-08 DIAGNOSIS — Z79899 Other long term (current) drug therapy: Secondary | ICD-10-CM | POA: Diagnosis not present

## 2022-02-08 DIAGNOSIS — E559 Vitamin D deficiency, unspecified: Secondary | ICD-10-CM | POA: Diagnosis not present

## 2022-02-09 DIAGNOSIS — I5030 Unspecified diastolic (congestive) heart failure: Secondary | ICD-10-CM | POA: Diagnosis not present

## 2022-02-09 DIAGNOSIS — I10 Essential (primary) hypertension: Secondary | ICD-10-CM | POA: Diagnosis not present

## 2022-02-09 DIAGNOSIS — E119 Type 2 diabetes mellitus without complications: Secondary | ICD-10-CM | POA: Diagnosis not present

## 2022-02-09 DIAGNOSIS — E039 Hypothyroidism, unspecified: Secondary | ICD-10-CM | POA: Diagnosis not present

## 2022-02-09 DIAGNOSIS — K219 Gastro-esophageal reflux disease without esophagitis: Secondary | ICD-10-CM | POA: Diagnosis not present

## 2022-02-09 DIAGNOSIS — E785 Hyperlipidemia, unspecified: Secondary | ICD-10-CM | POA: Diagnosis not present

## 2022-02-09 DIAGNOSIS — E559 Vitamin D deficiency, unspecified: Secondary | ICD-10-CM | POA: Diagnosis not present

## 2022-02-09 DIAGNOSIS — K589 Irritable bowel syndrome without diarrhea: Secondary | ICD-10-CM | POA: Diagnosis not present

## 2022-02-10 DIAGNOSIS — R300 Dysuria: Secondary | ICD-10-CM | POA: Diagnosis not present

## 2022-02-12 DIAGNOSIS — N390 Urinary tract infection, site not specified: Secondary | ICD-10-CM | POA: Diagnosis not present

## 2022-02-22 ENCOUNTER — Ambulatory Visit (INDEPENDENT_AMBULATORY_CARE_PROVIDER_SITE_OTHER): Payer: Medicare Other | Admitting: Gastroenterology

## 2022-02-26 DIAGNOSIS — E1165 Type 2 diabetes mellitus with hyperglycemia: Secondary | ICD-10-CM | POA: Diagnosis not present

## 2022-02-27 DIAGNOSIS — J449 Chronic obstructive pulmonary disease, unspecified: Secondary | ICD-10-CM | POA: Diagnosis not present

## 2022-02-27 DIAGNOSIS — N189 Chronic kidney disease, unspecified: Secondary | ICD-10-CM | POA: Diagnosis not present

## 2022-02-27 DIAGNOSIS — E038 Other specified hypothyroidism: Secondary | ICD-10-CM | POA: Diagnosis not present

## 2022-02-27 DIAGNOSIS — D518 Other vitamin B12 deficiency anemias: Secondary | ICD-10-CM | POA: Diagnosis not present

## 2022-02-27 DIAGNOSIS — E782 Mixed hyperlipidemia: Secondary | ICD-10-CM | POA: Diagnosis not present

## 2022-02-27 DIAGNOSIS — E119 Type 2 diabetes mellitus without complications: Secondary | ICD-10-CM | POA: Diagnosis not present

## 2022-02-27 DIAGNOSIS — E559 Vitamin D deficiency, unspecified: Secondary | ICD-10-CM | POA: Diagnosis not present

## 2022-02-27 DIAGNOSIS — I10 Essential (primary) hypertension: Secondary | ICD-10-CM | POA: Diagnosis not present

## 2022-03-08 ENCOUNTER — Encounter (INDEPENDENT_AMBULATORY_CARE_PROVIDER_SITE_OTHER): Payer: Self-pay | Admitting: Gastroenterology

## 2022-03-08 ENCOUNTER — Ambulatory Visit (INDEPENDENT_AMBULATORY_CARE_PROVIDER_SITE_OTHER): Payer: Medicare Other | Admitting: Gastroenterology

## 2022-03-08 VITALS — BP 89/59 | HR 60 | Temp 99.2°F | Ht 61.0 in | Wt 190.0 lb

## 2022-03-08 DIAGNOSIS — K7581 Nonalcoholic steatohepatitis (NASH): Secondary | ICD-10-CM | POA: Diagnosis not present

## 2022-03-08 DIAGNOSIS — K754 Autoimmune hepatitis: Secondary | ICD-10-CM | POA: Diagnosis not present
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
## 2022-03-08 NOTE — Patient Instructions (Addendum)
Will continue medication at current dosages, we will repeat labs again in November You can think about repeating screening colonoscopy and let me know how you would like to proceed  Follow up 6 months

## 2022-03-16 DIAGNOSIS — G8929 Other chronic pain: Secondary | ICD-10-CM | POA: Diagnosis not present

## 2022-03-16 DIAGNOSIS — G629 Polyneuropathy, unspecified: Secondary | ICD-10-CM | POA: Diagnosis not present

## 2022-03-16 DIAGNOSIS — E559 Vitamin D deficiency, unspecified: Secondary | ICD-10-CM | POA: Diagnosis not present

## 2022-03-16 DIAGNOSIS — K219 Gastro-esophageal reflux disease without esophagitis: Secondary | ICD-10-CM | POA: Diagnosis not present

## 2022-03-16 DIAGNOSIS — I5030 Unspecified diastolic (congestive) heart failure: Secondary | ICD-10-CM | POA: Diagnosis not present

## 2022-03-16 DIAGNOSIS — R5381 Other malaise: Secondary | ICD-10-CM | POA: Diagnosis not present

## 2022-03-16 DIAGNOSIS — I10 Essential (primary) hypertension: Secondary | ICD-10-CM | POA: Diagnosis not present

## 2022-03-16 DIAGNOSIS — E039 Hypothyroidism, unspecified: Secondary | ICD-10-CM | POA: Diagnosis not present

## 2022-03-16 DIAGNOSIS — E785 Hyperlipidemia, unspecified: Secondary | ICD-10-CM | POA: Diagnosis not present

## 2022-03-28 DIAGNOSIS — E1165 Type 2 diabetes mellitus with hyperglycemia: Secondary | ICD-10-CM | POA: Diagnosis not present

## 2022-03-29 DIAGNOSIS — E119 Type 2 diabetes mellitus without complications: Secondary | ICD-10-CM | POA: Diagnosis not present

## 2022-03-29 DIAGNOSIS — E038 Other specified hypothyroidism: Secondary | ICD-10-CM | POA: Diagnosis not present

## 2022-03-29 DIAGNOSIS — D518 Other vitamin B12 deficiency anemias: Secondary | ICD-10-CM | POA: Diagnosis not present

## 2022-03-29 DIAGNOSIS — E782 Mixed hyperlipidemia: Secondary | ICD-10-CM | POA: Diagnosis not present

## 2022-03-29 DIAGNOSIS — I10 Essential (primary) hypertension: Secondary | ICD-10-CM | POA: Diagnosis not present

## 2022-03-29 DIAGNOSIS — E559 Vitamin D deficiency, unspecified: Secondary | ICD-10-CM | POA: Diagnosis not present

## 2022-03-29 DIAGNOSIS — J449 Chronic obstructive pulmonary disease, unspecified: Secondary | ICD-10-CM | POA: Diagnosis not present

## 2022-04-07 DIAGNOSIS — E119 Type 2 diabetes mellitus without complications: Secondary | ICD-10-CM | POA: Diagnosis not present

## 2022-04-07 DIAGNOSIS — E785 Hyperlipidemia, unspecified: Secondary | ICD-10-CM | POA: Diagnosis not present

## 2022-04-07 DIAGNOSIS — E038 Other specified hypothyroidism: Secondary | ICD-10-CM | POA: Diagnosis not present

## 2022-04-07 DIAGNOSIS — N189 Chronic kidney disease, unspecified: Secondary | ICD-10-CM | POA: Diagnosis not present

## 2022-04-07 DIAGNOSIS — J449 Chronic obstructive pulmonary disease, unspecified: Secondary | ICD-10-CM | POA: Diagnosis not present

## 2022-04-07 DIAGNOSIS — I10 Essential (primary) hypertension: Secondary | ICD-10-CM | POA: Diagnosis not present

## 2022-04-14 DIAGNOSIS — K754 Autoimmune hepatitis: Secondary | ICD-10-CM | POA: Diagnosis not present

## 2022-04-15 LAB — CBC WITH DIFFERENTIAL/PLATELET
Basophils Absolute: 0 10*3/uL (ref 0.0–0.2)
Basos: 1 %
EOS (ABSOLUTE): 0 10*3/uL (ref 0.0–0.4)
Eos: 0 %
Hematocrit: 41 % (ref 34.0–46.6)
Hemoglobin: 13.4 g/dL (ref 11.1–15.9)
Immature Grans (Abs): 0.1 10*3/uL (ref 0.0–0.1)
Immature Granulocytes: 1 %
Lymphocytes Absolute: 0.8 10*3/uL (ref 0.7–3.1)
Lymphs: 12 %
MCH: 30.6 pg (ref 26.6–33.0)
MCHC: 32.7 g/dL (ref 31.5–35.7)
MCV: 94 fL (ref 79–97)
Monocytes Absolute: 0.3 10*3/uL (ref 0.1–0.9)
Monocytes: 4 %
Neutrophils Absolute: 5.2 10*3/uL (ref 1.4–7.0)
Neutrophils: 82 %
Platelets: 268 10*3/uL (ref 150–450)
RBC: 4.38 x10E6/uL (ref 3.77–5.28)
RDW: 14.3 % (ref 11.7–15.4)
WBC: 6.3 10*3/uL (ref 3.4–10.8)

## 2022-04-15 LAB — HEPATIC FUNCTION PANEL
ALT: 25 IU/L (ref 0–32)
AST: 36 IU/L (ref 0–40)
Albumin: 4.1 g/dL (ref 3.8–4.8)
Alkaline Phosphatase: 88 IU/L (ref 44–121)
Bilirubin Total: 0.3 mg/dL (ref 0.0–1.2)
Bilirubin, Direct: <0.1 mg/dL (ref 0.00–0.40)
Total Protein: 7.7 g/dL (ref 6.0–8.5)

## 2022-04-20 DIAGNOSIS — G2581 Restless legs syndrome: Secondary | ICD-10-CM | POA: Diagnosis not present

## 2022-04-20 DIAGNOSIS — K589 Irritable bowel syndrome without diarrhea: Secondary | ICD-10-CM | POA: Diagnosis not present

## 2022-04-20 DIAGNOSIS — I2510 Atherosclerotic heart disease of native coronary artery without angina pectoris: Secondary | ICD-10-CM | POA: Diagnosis not present

## 2022-04-20 DIAGNOSIS — D649 Anemia, unspecified: Secondary | ICD-10-CM | POA: Diagnosis not present

## 2022-04-20 DIAGNOSIS — E785 Hyperlipidemia, unspecified: Secondary | ICD-10-CM | POA: Diagnosis not present

## 2022-04-20 DIAGNOSIS — H547 Unspecified visual loss: Secondary | ICD-10-CM | POA: Diagnosis not present

## 2022-04-20 DIAGNOSIS — R109 Unspecified abdominal pain: Secondary | ICD-10-CM | POA: Diagnosis not present

## 2022-04-20 DIAGNOSIS — E119 Type 2 diabetes mellitus without complications: Secondary | ICD-10-CM | POA: Diagnosis not present

## 2022-04-20 DIAGNOSIS — E039 Hypothyroidism, unspecified: Secondary | ICD-10-CM | POA: Diagnosis not present

## 2022-04-20 DIAGNOSIS — R100 Acute abdomen: Secondary | ICD-10-CM | POA: Diagnosis not present

## 2022-04-20 DIAGNOSIS — G8929 Other chronic pain: Secondary | ICD-10-CM | POA: Diagnosis not present

## 2022-04-20 DIAGNOSIS — M545 Low back pain, unspecified: Secondary | ICD-10-CM | POA: Diagnosis not present

## 2022-04-28 ENCOUNTER — Other Ambulatory Visit (INDEPENDENT_AMBULATORY_CARE_PROVIDER_SITE_OTHER): Payer: Self-pay | Admitting: Gastroenterology

## 2022-04-28 DIAGNOSIS — E1165 Type 2 diabetes mellitus with hyperglycemia: Secondary | ICD-10-CM | POA: Diagnosis not present

## 2022-04-28 DIAGNOSIS — K754 Autoimmune hepatitis: Secondary | ICD-10-CM

## 2022-04-28 MED ORDER — AZATHIOPRINE 50 MG PO TABS: 150.0000 mg | ORAL_TABLET | Freq: Every day | ORAL | 1 refills | Status: DC

## 2022-04-29 LAB — SPECIMEN STATUS REPORT

## 2022-04-29 LAB — IGG: IgG (Immunoglobin G), Serum: 1786 mg/dL — ABNORMAL HIGH (ref 586–1602)

## 2022-05-18 DIAGNOSIS — I10 Essential (primary) hypertension: Secondary | ICD-10-CM | POA: Diagnosis not present

## 2022-05-18 DIAGNOSIS — E119 Type 2 diabetes mellitus without complications: Secondary | ICD-10-CM | POA: Diagnosis not present

## 2022-05-18 DIAGNOSIS — G2581 Restless legs syndrome: Secondary | ICD-10-CM | POA: Diagnosis not present

## 2022-05-18 DIAGNOSIS — K219 Gastro-esophageal reflux disease without esophagitis: Secondary | ICD-10-CM | POA: Diagnosis not present

## 2022-05-18 DIAGNOSIS — E785 Hyperlipidemia, unspecified: Secondary | ICD-10-CM | POA: Diagnosis not present

## 2022-05-18 DIAGNOSIS — E559 Vitamin D deficiency, unspecified: Secondary | ICD-10-CM | POA: Diagnosis not present

## 2022-05-18 DIAGNOSIS — J449 Chronic obstructive pulmonary disease, unspecified: Secondary | ICD-10-CM | POA: Diagnosis not present

## 2022-05-18 DIAGNOSIS — E039 Hypothyroidism, unspecified: Secondary | ICD-10-CM | POA: Diagnosis not present

## 2022-05-18 DIAGNOSIS — I5030 Unspecified diastolic (congestive) heart failure: Secondary | ICD-10-CM | POA: Diagnosis not present

## 2022-05-18 DIAGNOSIS — I2510 Atherosclerotic heart disease of native coronary artery without angina pectoris: Secondary | ICD-10-CM | POA: Diagnosis not present

## 2022-05-18 DIAGNOSIS — G629 Polyneuropathy, unspecified: Secondary | ICD-10-CM | POA: Diagnosis not present

## 2022-05-20 DIAGNOSIS — E119 Type 2 diabetes mellitus without complications: Secondary | ICD-10-CM | POA: Diagnosis not present

## 2022-05-20 DIAGNOSIS — E038 Other specified hypothyroidism: Secondary | ICD-10-CM | POA: Diagnosis not present

## 2022-05-20 DIAGNOSIS — Z79899 Other long term (current) drug therapy: Secondary | ICD-10-CM | POA: Diagnosis not present

## 2022-05-20 DIAGNOSIS — D518 Other vitamin B12 deficiency anemias: Secondary | ICD-10-CM | POA: Diagnosis not present

## 2022-05-20 DIAGNOSIS — E782 Mixed hyperlipidemia: Secondary | ICD-10-CM | POA: Diagnosis not present

## 2022-05-24 DIAGNOSIS — E119 Type 2 diabetes mellitus without complications: Secondary | ICD-10-CM | POA: Diagnosis not present

## 2022-05-24 DIAGNOSIS — E1165 Type 2 diabetes mellitus with hyperglycemia: Secondary | ICD-10-CM | POA: Diagnosis not present

## 2022-05-25 DIAGNOSIS — E785 Hyperlipidemia, unspecified: Secondary | ICD-10-CM | POA: Diagnosis not present

## 2022-05-25 DIAGNOSIS — I10 Essential (primary) hypertension: Secondary | ICD-10-CM | POA: Diagnosis not present

## 2022-05-25 DIAGNOSIS — E119 Type 2 diabetes mellitus without complications: Secondary | ICD-10-CM | POA: Diagnosis not present

## 2022-05-25 DIAGNOSIS — E559 Vitamin D deficiency, unspecified: Secondary | ICD-10-CM | POA: Diagnosis not present

## 2022-05-25 DIAGNOSIS — N189 Chronic kidney disease, unspecified: Secondary | ICD-10-CM | POA: Diagnosis not present

## 2022-05-25 DIAGNOSIS — E038 Other specified hypothyroidism: Secondary | ICD-10-CM | POA: Diagnosis not present

## 2022-05-25 DIAGNOSIS — J449 Chronic obstructive pulmonary disease, unspecified: Secondary | ICD-10-CM | POA: Diagnosis not present

## 2022-05-25 DIAGNOSIS — D518 Other vitamin B12 deficiency anemias: Secondary | ICD-10-CM | POA: Diagnosis not present

## 2022-05-25 DIAGNOSIS — I5030 Unspecified diastolic (congestive) heart failure: Secondary | ICD-10-CM | POA: Diagnosis not present

## 2022-05-28 DIAGNOSIS — E1165 Type 2 diabetes mellitus with hyperglycemia: Secondary | ICD-10-CM | POA: Diagnosis not present

## 2022-05-30 DIAGNOSIS — A4901 Methicillin susceptible Staphylococcus aureus infection, unspecified site: Secondary | ICD-10-CM | POA: Diagnosis not present

## 2022-06-08 DIAGNOSIS — R0602 Shortness of breath: Secondary | ICD-10-CM | POA: Diagnosis not present

## 2022-06-15 DIAGNOSIS — E039 Hypothyroidism, unspecified: Secondary | ICD-10-CM | POA: Diagnosis not present

## 2022-06-15 DIAGNOSIS — J449 Chronic obstructive pulmonary disease, unspecified: Secondary | ICD-10-CM | POA: Diagnosis not present

## 2022-06-15 DIAGNOSIS — I2510 Atherosclerotic heart disease of native coronary artery without angina pectoris: Secondary | ICD-10-CM | POA: Diagnosis not present

## 2022-06-15 DIAGNOSIS — E119 Type 2 diabetes mellitus without complications: Secondary | ICD-10-CM | POA: Diagnosis not present

## 2022-06-15 DIAGNOSIS — R519 Headache, unspecified: Secondary | ICD-10-CM | POA: Diagnosis not present

## 2022-06-15 DIAGNOSIS — E785 Hyperlipidemia, unspecified: Secondary | ICD-10-CM | POA: Diagnosis not present

## 2022-06-15 DIAGNOSIS — I10 Essential (primary) hypertension: Secondary | ICD-10-CM | POA: Diagnosis not present

## 2022-06-15 DIAGNOSIS — R6883 Chills (without fever): Secondary | ICD-10-CM | POA: Diagnosis not present

## 2022-06-15 DIAGNOSIS — I5030 Unspecified diastolic (congestive) heart failure: Secondary | ICD-10-CM | POA: Diagnosis not present

## 2022-06-15 DIAGNOSIS — K219 Gastro-esophageal reflux disease without esophagitis: Secondary | ICD-10-CM | POA: Diagnosis not present

## 2022-06-18 DIAGNOSIS — A4901 Methicillin susceptible Staphylococcus aureus infection, unspecified site: Secondary | ICD-10-CM | POA: Diagnosis not present

## 2022-06-21 DIAGNOSIS — E559 Vitamin D deficiency, unspecified: Secondary | ICD-10-CM | POA: Diagnosis not present

## 2022-06-21 DIAGNOSIS — I129 Hypertensive chronic kidney disease with stage 1 through stage 4 chronic kidney disease, or unspecified chronic kidney disease: Secondary | ICD-10-CM | POA: Diagnosis not present

## 2022-06-21 DIAGNOSIS — N189 Chronic kidney disease, unspecified: Secondary | ICD-10-CM | POA: Diagnosis not present

## 2022-06-21 DIAGNOSIS — E038 Other specified hypothyroidism: Secondary | ICD-10-CM | POA: Diagnosis not present

## 2022-06-21 DIAGNOSIS — I2510 Atherosclerotic heart disease of native coronary artery without angina pectoris: Secondary | ICD-10-CM | POA: Diagnosis not present

## 2022-06-21 DIAGNOSIS — J449 Chronic obstructive pulmonary disease, unspecified: Secondary | ICD-10-CM | POA: Diagnosis not present

## 2022-06-21 DIAGNOSIS — I10 Essential (primary) hypertension: Secondary | ICD-10-CM | POA: Diagnosis not present

## 2022-06-21 DIAGNOSIS — E785 Hyperlipidemia, unspecified: Secondary | ICD-10-CM | POA: Diagnosis not present

## 2022-06-21 DIAGNOSIS — E119 Type 2 diabetes mellitus without complications: Secondary | ICD-10-CM | POA: Diagnosis not present

## 2022-06-21 DIAGNOSIS — I5030 Unspecified diastolic (congestive) heart failure: Secondary | ICD-10-CM | POA: Diagnosis not present

## 2022-06-21 DIAGNOSIS — D518 Other vitamin B12 deficiency anemias: Secondary | ICD-10-CM | POA: Diagnosis not present

## 2022-06-28 DIAGNOSIS — E1165 Type 2 diabetes mellitus with hyperglycemia: Secondary | ICD-10-CM | POA: Diagnosis not present

## 2022-07-11 DIAGNOSIS — E1165 Type 2 diabetes mellitus with hyperglycemia: Secondary | ICD-10-CM | POA: Diagnosis not present

## 2022-07-11 DIAGNOSIS — Z794 Long term (current) use of insulin: Secondary | ICD-10-CM | POA: Diagnosis not present

## 2022-07-13 DIAGNOSIS — N189 Chronic kidney disease, unspecified: Secondary | ICD-10-CM | POA: Diagnosis not present

## 2022-07-13 DIAGNOSIS — D518 Other vitamin B12 deficiency anemias: Secondary | ICD-10-CM | POA: Diagnosis not present

## 2022-07-13 DIAGNOSIS — I5030 Unspecified diastolic (congestive) heart failure: Secondary | ICD-10-CM | POA: Diagnosis not present

## 2022-07-13 DIAGNOSIS — E559 Vitamin D deficiency, unspecified: Secondary | ICD-10-CM | POA: Diagnosis not present

## 2022-07-13 DIAGNOSIS — K589 Irritable bowel syndrome without diarrhea: Secondary | ICD-10-CM | POA: Diagnosis not present

## 2022-07-13 DIAGNOSIS — E038 Other specified hypothyroidism: Secondary | ICD-10-CM | POA: Diagnosis not present

## 2022-07-13 DIAGNOSIS — E119 Type 2 diabetes mellitus without complications: Secondary | ICD-10-CM | POA: Diagnosis not present

## 2022-07-13 DIAGNOSIS — E785 Hyperlipidemia, unspecified: Secondary | ICD-10-CM | POA: Diagnosis not present

## 2022-07-13 DIAGNOSIS — I10 Essential (primary) hypertension: Secondary | ICD-10-CM | POA: Diagnosis not present

## 2022-07-13 DIAGNOSIS — K219 Gastro-esophageal reflux disease without esophagitis: Secondary | ICD-10-CM | POA: Diagnosis not present

## 2022-07-13 DIAGNOSIS — G629 Polyneuropathy, unspecified: Secondary | ICD-10-CM | POA: Diagnosis not present

## 2022-07-13 DIAGNOSIS — I2510 Atherosclerotic heart disease of native coronary artery without angina pectoris: Secondary | ICD-10-CM | POA: Diagnosis not present

## 2022-07-13 DIAGNOSIS — E039 Hypothyroidism, unspecified: Secondary | ICD-10-CM | POA: Diagnosis not present

## 2022-07-13 DIAGNOSIS — J449 Chronic obstructive pulmonary disease, unspecified: Secondary | ICD-10-CM | POA: Diagnosis not present

## 2022-07-19 ENCOUNTER — Encounter: Payer: Self-pay | Admitting: Cardiology

## 2022-07-19 ENCOUNTER — Ambulatory Visit: Payer: Medicare Other | Attending: Cardiology | Admitting: Cardiology

## 2022-07-19 VITALS — BP 120/70 | HR 55 | Ht 61.0 in | Wt 195.0 lb

## 2022-07-19 DIAGNOSIS — I10 Essential (primary) hypertension: Secondary | ICD-10-CM | POA: Diagnosis not present

## 2022-07-19 DIAGNOSIS — R300 Dysuria: Secondary | ICD-10-CM | POA: Diagnosis not present

## 2022-07-19 DIAGNOSIS — I2510 Atherosclerotic heart disease of native coronary artery without angina pectoris: Secondary | ICD-10-CM

## 2022-07-19 DIAGNOSIS — E782 Mixed hyperlipidemia: Secondary | ICD-10-CM | POA: Diagnosis not present

## 2022-07-19 NOTE — Patient Instructions (Addendum)
Medication Instructions:  Continue all current medications.   Labwork: none  Testing/Procedures: none  Follow-Up: 6 months   Any Other Special Instructions Will Be Listed Below (If Applicable). You have been referred to [facility]   If you need a refill on your cardiac medications before your next appointment, please call your pharmacy.
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
## 2022-07-21 DIAGNOSIS — N390 Urinary tract infection, site not specified: Secondary | ICD-10-CM | POA: Diagnosis not present

## 2022-07-23 ENCOUNTER — Other Ambulatory Visit (INDEPENDENT_AMBULATORY_CARE_PROVIDER_SITE_OTHER): Payer: Self-pay | Admitting: *Deleted

## 2022-07-23 DIAGNOSIS — K754 Autoimmune hepatitis: Secondary | ICD-10-CM

## 2022-07-28 ENCOUNTER — Encounter (INDEPENDENT_AMBULATORY_CARE_PROVIDER_SITE_OTHER): Payer: Self-pay | Admitting: Gastroenterology

## 2022-07-29 DIAGNOSIS — Z79899 Other long term (current) drug therapy: Secondary | ICD-10-CM | POA: Diagnosis not present

## 2022-07-29 DIAGNOSIS — R945 Abnormal results of liver function studies: Secondary | ICD-10-CM | POA: Diagnosis not present

## 2022-07-29 DIAGNOSIS — E1165 Type 2 diabetes mellitus with hyperglycemia: Secondary | ICD-10-CM | POA: Diagnosis not present

## 2022-08-05 DIAGNOSIS — Z79899 Other long term (current) drug therapy: Secondary | ICD-10-CM | POA: Diagnosis not present

## 2022-08-05 DIAGNOSIS — D518 Other vitamin B12 deficiency anemias: Secondary | ICD-10-CM | POA: Diagnosis not present

## 2022-08-05 DIAGNOSIS — E782 Mixed hyperlipidemia: Secondary | ICD-10-CM | POA: Diagnosis not present

## 2022-08-05 DIAGNOSIS — E119 Type 2 diabetes mellitus without complications: Secondary | ICD-10-CM | POA: Diagnosis not present

## 2022-08-05 DIAGNOSIS — E038 Other specified hypothyroidism: Secondary | ICD-10-CM | POA: Diagnosis not present

## 2022-08-10 ENCOUNTER — Telehealth (INDEPENDENT_AMBULATORY_CARE_PROVIDER_SITE_OTHER): Payer: Self-pay | Admitting: *Deleted

## 2022-08-10 NOTE — Telephone Encounter (Signed)
Patient in reminder file to have repeat lft and IgG. [facility] drew LFT but not IgG. I spoke with tiffany at [facility] and refaxed her the order for IgG and she said they would draw it. LFT result on your desk.

## 2022-08-12 DIAGNOSIS — E119 Type 2 diabetes mellitus without complications: Secondary | ICD-10-CM | POA: Diagnosis not present

## 2022-08-12 DIAGNOSIS — E782 Mixed hyperlipidemia: Secondary | ICD-10-CM | POA: Diagnosis not present

## 2022-08-12 DIAGNOSIS — Z79899 Other long term (current) drug therapy: Secondary | ICD-10-CM | POA: Diagnosis not present

## 2022-08-12 DIAGNOSIS — D518 Other vitamin B12 deficiency anemias: Secondary | ICD-10-CM | POA: Diagnosis not present

## 2022-08-16 DIAGNOSIS — I70223 Atherosclerosis of native arteries of extremities with rest pain, bilateral legs: Secondary | ICD-10-CM | POA: Diagnosis not present

## 2022-08-16 DIAGNOSIS — N189 Chronic kidney disease, unspecified: Secondary | ICD-10-CM | POA: Diagnosis not present

## 2022-08-16 DIAGNOSIS — E039 Hypothyroidism, unspecified: Secondary | ICD-10-CM | POA: Diagnosis not present

## 2022-08-16 DIAGNOSIS — E119 Type 2 diabetes mellitus without complications: Secondary | ICD-10-CM | POA: Diagnosis not present

## 2022-08-16 DIAGNOSIS — D518 Other vitamin B12 deficiency anemias: Secondary | ICD-10-CM | POA: Diagnosis not present

## 2022-08-16 DIAGNOSIS — E559 Vitamin D deficiency, unspecified: Secondary | ICD-10-CM | POA: Diagnosis not present

## 2022-08-16 DIAGNOSIS — J449 Chronic obstructive pulmonary disease, unspecified: Secondary | ICD-10-CM | POA: Diagnosis not present

## 2022-08-16 DIAGNOSIS — E7849 Other hyperlipidemia: Secondary | ICD-10-CM | POA: Diagnosis not present

## 2022-08-16 DIAGNOSIS — I10 Essential (primary) hypertension: Secondary | ICD-10-CM | POA: Diagnosis not present

## 2022-08-16 DIAGNOSIS — I2510 Atherosclerotic heart disease of native coronary artery without angina pectoris: Secondary | ICD-10-CM | POA: Diagnosis not present

## 2022-08-16 NOTE — Telephone Encounter (Signed)
IgG on desk for review

## 2022-08-17 DIAGNOSIS — Z79899 Other long term (current) drug therapy: Secondary | ICD-10-CM | POA: Diagnosis not present

## 2022-08-20 ENCOUNTER — Encounter: Payer: Self-pay | Admitting: Cardiology

## 2022-08-30 DIAGNOSIS — R300 Dysuria: Secondary | ICD-10-CM | POA: Diagnosis not present

## 2022-08-31 DIAGNOSIS — E785 Hyperlipidemia, unspecified: Secondary | ICD-10-CM | POA: Diagnosis not present

## 2022-08-31 DIAGNOSIS — K219 Gastro-esophageal reflux disease without esophagitis: Secondary | ICD-10-CM | POA: Diagnosis not present

## 2022-08-31 DIAGNOSIS — E039 Hypothyroidism, unspecified: Secondary | ICD-10-CM | POA: Diagnosis not present

## 2022-08-31 DIAGNOSIS — I5030 Unspecified diastolic (congestive) heart failure: Secondary | ICD-10-CM | POA: Diagnosis not present

## 2022-08-31 DIAGNOSIS — J449 Chronic obstructive pulmonary disease, unspecified: Secondary | ICD-10-CM | POA: Diagnosis not present

## 2022-08-31 DIAGNOSIS — G2581 Restless legs syndrome: Secondary | ICD-10-CM | POA: Diagnosis not present

## 2022-08-31 DIAGNOSIS — H547 Unspecified visual loss: Secondary | ICD-10-CM | POA: Diagnosis not present

## 2022-08-31 DIAGNOSIS — E119 Type 2 diabetes mellitus without complications: Secondary | ICD-10-CM | POA: Diagnosis not present

## 2022-08-31 DIAGNOSIS — K589 Irritable bowel syndrome without diarrhea: Secondary | ICD-10-CM | POA: Diagnosis not present

## 2022-08-31 DIAGNOSIS — I2510 Atherosclerotic heart disease of native coronary artery without angina pectoris: Secondary | ICD-10-CM | POA: Diagnosis not present

## 2022-08-31 DIAGNOSIS — E559 Vitamin D deficiency, unspecified: Secondary | ICD-10-CM | POA: Diagnosis not present

## 2022-08-31 DIAGNOSIS — G629 Polyneuropathy, unspecified: Secondary | ICD-10-CM | POA: Diagnosis not present

## 2022-09-01 DIAGNOSIS — N390 Urinary tract infection, site not specified: Secondary | ICD-10-CM | POA: Diagnosis not present

## 2022-09-03 ENCOUNTER — Other Ambulatory Visit: Payer: Self-pay | Admitting: *Deleted

## 2022-09-03 DIAGNOSIS — K754 Autoimmune hepatitis: Secondary | ICD-10-CM

## 2022-09-03 MED ORDER — PREDNISONE 5 MG PO TABS: 10.0000 mg | ORAL_TABLET | Freq: Every day | ORAL | 0 refills | Status: DC

## 2022-09-03 NOTE — Telephone Encounter (Signed)
Hannah James, NP  Hannah Kung, LPN Caller: Unspecified (3 weeks ago) Reviewed labs, 1g is 1573, ALT 41 and AST 61. Will continue with azathioprine 150mg  and decrease prednisone to 10mg  daily. Can we place in reminder folder to Repeat CMP, IgG and CBC in 3 months.    Patient added to reminder file to repeat labs in 3 months. I sent new dose of prednisone 10mg  daily to [place] [facility] and I called and left a message with [facility] to discuss results and med change. Await call back.

## 2022-09-06 NOTE — Telephone Encounter (Signed)
Tried calling [facility] x [facility] this morning and no answer either time. Will try back later today.

## 2022-09-06 NOTE — Telephone Encounter (Signed)
Left message on voicemail at [facility] for someone to return call about med changes.

## 2022-09-07 ENCOUNTER — Ambulatory Visit (INDEPENDENT_AMBULATORY_CARE_PROVIDER_SITE_OTHER): Payer: Medicare Other | Admitting: Gastroenterology

## 2022-09-07 NOTE — Telephone Encounter (Signed)
Tried calling [place] [facility] [place] again and no answer

## 2022-09-09 ENCOUNTER — Ambulatory Visit (INDEPENDENT_AMBULATORY_CARE_PROVIDER_SITE_OTHER): Payer: Medicare Other | Admitting: Gastroenterology

## 2022-09-09 ENCOUNTER — Encounter (INDEPENDENT_AMBULATORY_CARE_PROVIDER_SITE_OTHER): Payer: Self-pay | Admitting: Gastroenterology

## 2022-09-09 VITALS — BP 126/68 | HR 55 | Temp 98.0°F | Ht 61.0 in | Wt 193.0 lb

## 2022-09-09 DIAGNOSIS — R1030 Lower abdominal pain, unspecified: Secondary | ICD-10-CM | POA: Diagnosis not present

## 2022-09-09 DIAGNOSIS — K754 Autoimmune hepatitis: Secondary | ICD-10-CM | POA: Diagnosis not present

## 2022-09-09 DIAGNOSIS — R195 Other fecal abnormalities: Secondary | ICD-10-CM | POA: Diagnosis not present
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
## 2022-09-09 NOTE — Patient Instructions (Signed)
We will get you scheduled for colonoscopy We will continue with azathioprine 150mg  daily and prednisone 10mg  daily Continue with protonix 40mg  BID, be mindful of greasy, spicy, fried, citrus foods, and be mindful that caffeine, carbonated drinks, chocolate can increase reflux symptoms, Stay upright 2-3 hours after eating, prior to lying down and avoid eating late in the evenings.  Follow up 3 months

## 2022-09-09 NOTE — Telephone Encounter (Signed)
Patient came in office today and this was discussed.

## 2022-09-10 ENCOUNTER — Telehealth (INDEPENDENT_AMBULATORY_CARE_PROVIDER_SITE_OTHER): Payer: Self-pay | Admitting: Gastroenterology

## 2022-09-10 MED ORDER — PEG 3350-KCL-NA BICARB-NACL 420 G PO SOLR: 4000.0000 mL | Freq: Once | ORAL | 0 refills | Status: AC

## 2022-09-10 NOTE — Telephone Encounter (Signed)
[facility] contacted and colonoscopy scheduled for 10/07/22. Prep sent to pharmacy. Instructions will be faxed to [facility] at [phone]. Will call with pre op appt.   Per [facility] Notification or Prior Authorization is not required for the requested services You are not required to submit a notification/prior authorization based on the information provided. The number above acknowledges your inquiry and our response. Please write this number down and refer to it for future inquiries. If you still wish to submit your request for review, please select the Continue with Submission button below. Decision ID #: [id number]

## 2022-09-13 DIAGNOSIS — E039 Hypothyroidism, unspecified: Secondary | ICD-10-CM | POA: Diagnosis not present

## 2022-09-13 DIAGNOSIS — D518 Other vitamin B12 deficiency anemias: Secondary | ICD-10-CM | POA: Diagnosis not present

## 2022-09-13 DIAGNOSIS — I5030 Unspecified diastolic (congestive) heart failure: Secondary | ICD-10-CM | POA: Diagnosis not present

## 2022-09-13 DIAGNOSIS — I10 Essential (primary) hypertension: Secondary | ICD-10-CM | POA: Diagnosis not present

## 2022-09-13 DIAGNOSIS — E7849 Other hyperlipidemia: Secondary | ICD-10-CM | POA: Diagnosis not present

## 2022-09-13 DIAGNOSIS — E559 Vitamin D deficiency, unspecified: Secondary | ICD-10-CM | POA: Diagnosis not present

## 2022-09-13 DIAGNOSIS — E119 Type 2 diabetes mellitus without complications: Secondary | ICD-10-CM | POA: Diagnosis not present

## 2022-09-13 DIAGNOSIS — I2510 Atherosclerotic heart disease of native coronary artery without angina pectoris: Secondary | ICD-10-CM | POA: Diagnosis not present

## 2022-09-13 DIAGNOSIS — I129 Hypertensive chronic kidney disease with stage 1 through stage 4 chronic kidney disease, or unspecified chronic kidney disease: Secondary | ICD-10-CM | POA: Diagnosis not present

## 2022-09-13 DIAGNOSIS — J449 Chronic obstructive pulmonary disease, unspecified: Secondary | ICD-10-CM | POA: Diagnosis not present

## 2022-09-14 DIAGNOSIS — R195 Other fecal abnormalities: Secondary | ICD-10-CM | POA: Insufficient documentation

## 2022-09-21 DIAGNOSIS — R519 Headache, unspecified: Secondary | ICD-10-CM | POA: Diagnosis not present

## 2022-09-21 DIAGNOSIS — R062 Wheezing: Secondary | ICD-10-CM | POA: Diagnosis not present

## 2022-09-21 DIAGNOSIS — R058 Other specified cough: Secondary | ICD-10-CM | POA: Diagnosis not present

## 2022-09-21 DIAGNOSIS — J029 Acute pharyngitis, unspecified: Secondary | ICD-10-CM | POA: Diagnosis not present

## 2022-09-21 DIAGNOSIS — R0602 Shortness of breath: Secondary | ICD-10-CM | POA: Diagnosis not present

## 2022-09-22 ENCOUNTER — Emergency Department (HOSPITAL_COMMUNITY): Payer: Medicare Other

## 2022-09-22 ENCOUNTER — Other Ambulatory Visit: Payer: Self-pay

## 2022-09-22 ENCOUNTER — Inpatient Hospital Stay (HOSPITAL_COMMUNITY)
Admission: EM | Admit: 2022-09-22 | Discharge: 2022-09-27 | DRG: 193 | Disposition: A | Discharge status: Nursing Home (Includes ICF) | Payer: Medicare Other | Source: Skilled Nursing Facility | Attending: Family Medicine | Admitting: Family Medicine

## 2022-09-22 ENCOUNTER — Encounter (HOSPITAL_COMMUNITY): Payer: Self-pay

## 2022-09-22 DIAGNOSIS — J440 Chronic obstructive pulmonary disease with acute lower respiratory infection: Secondary | ICD-10-CM | POA: Diagnosis not present

## 2022-09-22 DIAGNOSIS — E11649 Type 2 diabetes mellitus with hypoglycemia without coma: Secondary | ICD-10-CM | POA: Diagnosis present

## 2022-09-22 DIAGNOSIS — G9341 Metabolic encephalopathy: Secondary | ICD-10-CM | POA: Diagnosis not present

## 2022-09-22 DIAGNOSIS — I2510 Atherosclerotic heart disease of native coronary artery without angina pectoris: Secondary | ICD-10-CM | POA: Diagnosis present

## 2022-09-22 DIAGNOSIS — I4891 Unspecified atrial fibrillation: Secondary | ICD-10-CM | POA: Diagnosis not present

## 2022-09-22 DIAGNOSIS — Z743 Need for continuous supervision: Secondary | ICD-10-CM | POA: Diagnosis not present

## 2022-09-22 DIAGNOSIS — E1122 Type 2 diabetes mellitus with diabetic chronic kidney disease: Secondary | ICD-10-CM | POA: Diagnosis not present

## 2022-09-22 DIAGNOSIS — Z79890 Hormone replacement therapy (postmenopausal): Secondary | ICD-10-CM

## 2022-09-22 DIAGNOSIS — Z1152 Encounter for screening for COVID-19: Secondary | ICD-10-CM

## 2022-09-22 DIAGNOSIS — E785 Hyperlipidemia, unspecified: Secondary | ICD-10-CM | POA: Diagnosis present

## 2022-09-22 DIAGNOSIS — E1140 Type 2 diabetes mellitus with diabetic neuropathy, unspecified: Secondary | ICD-10-CM | POA: Diagnosis not present

## 2022-09-22 DIAGNOSIS — T40602A Poisoning by unspecified narcotics, intentional self-harm, initial encounter: Principal | ICD-10-CM | POA: Insufficient documentation

## 2022-09-22 DIAGNOSIS — Z7952 Long term (current) use of systemic steroids: Secondary | ICD-10-CM

## 2022-09-22 DIAGNOSIS — J189 Pneumonia, unspecified organism: Secondary | ICD-10-CM | POA: Diagnosis present

## 2022-09-22 DIAGNOSIS — K219 Gastro-esophageal reflux disease without esophagitis: Secondary | ICD-10-CM | POA: Diagnosis not present

## 2022-09-22 DIAGNOSIS — I10 Essential (primary) hypertension: Secondary | ICD-10-CM | POA: Diagnosis present

## 2022-09-22 DIAGNOSIS — Z170 Estrogen receptor positive status [ER+]: Secondary | ICD-10-CM

## 2022-09-22 DIAGNOSIS — A419 Sepsis, unspecified organism: Secondary | ICD-10-CM | POA: Diagnosis not present

## 2022-09-22 DIAGNOSIS — Z6836 Body mass index (BMI) 36.0-36.9, adult: Secondary | ICD-10-CM

## 2022-09-22 DIAGNOSIS — J441 Chronic obstructive pulmonary disease with (acute) exacerbation: Secondary | ICD-10-CM

## 2022-09-22 DIAGNOSIS — E669 Obesity, unspecified: Secondary | ICD-10-CM | POA: Diagnosis present

## 2022-09-22 DIAGNOSIS — Z8249 Family history of ischemic heart disease and other diseases of the circulatory system: Secondary | ICD-10-CM

## 2022-09-22 DIAGNOSIS — J9601 Acute respiratory failure with hypoxia: Secondary | ICD-10-CM | POA: Diagnosis present

## 2022-09-22 DIAGNOSIS — R6889 Other general symptoms and signs: Secondary | ICD-10-CM | POA: Diagnosis not present

## 2022-09-22 DIAGNOSIS — J9600 Acute respiratory failure, unspecified whether with hypoxia or hypercapnia: Secondary | ICD-10-CM | POA: Diagnosis present

## 2022-09-22 DIAGNOSIS — I5031 Acute diastolic (congestive) heart failure: Secondary | ICD-10-CM

## 2022-09-22 DIAGNOSIS — K754 Autoimmune hepatitis: Secondary | ICD-10-CM | POA: Diagnosis present

## 2022-09-22 DIAGNOSIS — R918 Other nonspecific abnormal finding of lung field: Secondary | ICD-10-CM | POA: Diagnosis not present

## 2022-09-22 DIAGNOSIS — E1165 Type 2 diabetes mellitus with hyperglycemia: Secondary | ICD-10-CM | POA: Diagnosis present

## 2022-09-22 DIAGNOSIS — I129 Hypertensive chronic kidney disease with stage 1 through stage 4 chronic kidney disease, or unspecified chronic kidney disease: Secondary | ICD-10-CM | POA: Diagnosis present

## 2022-09-22 DIAGNOSIS — Z7984 Long term (current) use of oral hypoglycemic drugs: Secondary | ICD-10-CM

## 2022-09-22 DIAGNOSIS — E039 Hypothyroidism, unspecified: Secondary | ICD-10-CM | POA: Diagnosis present

## 2022-09-22 DIAGNOSIS — D696 Thrombocytopenia, unspecified: Secondary | ICD-10-CM | POA: Diagnosis present

## 2022-09-22 DIAGNOSIS — Z87891 Personal history of nicotine dependence: Secondary | ICD-10-CM

## 2022-09-22 DIAGNOSIS — I700 Atherosclerosis of aorta: Secondary | ICD-10-CM

## 2022-09-22 DIAGNOSIS — Z7901 Long term (current) use of anticoagulants: Secondary | ICD-10-CM | POA: Diagnosis not present

## 2022-09-22 DIAGNOSIS — Z885 Allergy status to narcotic agent status: Secondary | ICD-10-CM

## 2022-09-22 DIAGNOSIS — Z79624 Long term (current) use of inhibitors of nucleotide synthesis: Secondary | ICD-10-CM

## 2022-09-22 DIAGNOSIS — Z801 Family history of malignant neoplasm of trachea, bronchus and lung: Secondary | ICD-10-CM

## 2022-09-22 DIAGNOSIS — Z79899 Other long term (current) drug therapy: Secondary | ICD-10-CM

## 2022-09-22 DIAGNOSIS — R509 Fever, unspecified: Secondary | ICD-10-CM | POA: Diagnosis not present

## 2022-09-22 DIAGNOSIS — R0902 Hypoxemia: Secondary | ICD-10-CM | POA: Diagnosis not present

## 2022-09-22 DIAGNOSIS — Z794 Long term (current) use of insulin: Secondary | ICD-10-CM

## 2022-09-22 DIAGNOSIS — N183 Chronic kidney disease, stage 3 unspecified: Secondary | ICD-10-CM | POA: Diagnosis not present

## 2022-09-22 DIAGNOSIS — Z7982 Long term (current) use of aspirin: Secondary | ICD-10-CM

## 2022-09-22 DIAGNOSIS — E2740 Unspecified adrenocortical insufficiency: Secondary | ICD-10-CM

## 2022-09-22 DIAGNOSIS — H548 Legal blindness, as defined in USA: Secondary | ICD-10-CM | POA: Diagnosis present

## 2022-09-22 DIAGNOSIS — Z955 Presence of coronary angioplasty implant and graft: Secondary | ICD-10-CM

## 2022-09-22 DIAGNOSIS — I480 Paroxysmal atrial fibrillation: Secondary | ICD-10-CM | POA: Diagnosis not present

## 2022-09-22 DIAGNOSIS — R070 Pain in throat: Secondary | ICD-10-CM | POA: Diagnosis not present

## 2022-09-22 DIAGNOSIS — Z823 Family history of stroke: Secondary | ICD-10-CM

## 2022-09-22 DIAGNOSIS — R519 Headache, unspecified: Secondary | ICD-10-CM | POA: Diagnosis not present

## 2022-09-22 DIAGNOSIS — E119 Type 2 diabetes mellitus without complications: Secondary | ICD-10-CM

## 2022-09-22 DIAGNOSIS — E7849 Other hyperlipidemia: Secondary | ICD-10-CM | POA: Diagnosis not present

## 2022-09-22 LAB — URINALYSIS, W/ REFLEX TO CULTURE (INFECTION SUSPECTED)
Bilirubin Urine: NEGATIVE
Glucose, UA: NEGATIVE mg/dL
Ketones, ur: NEGATIVE mg/dL
Nitrite: POSITIVE — AB
Protein, ur: 30 mg/dL — AB
Specific Gravity, Urine: >1.046 — ABNORMAL HIGH (ref 1.005–1.030)
WBC, UA: >50 WBC/hpf (ref 0–5)
pH: 6 (ref 5.0–8.0)

## 2022-09-22 LAB — PROTIME-INR
INR: 1.2 (ref 0.8–1.2)
Prothrombin Time: 15.5 seconds — ABNORMAL HIGH (ref 11.4–15.2)

## 2022-09-22 LAB — COMPREHENSIVE METABOLIC PANEL
ALT: 17 U/L (ref 0–44)
AST: 36 U/L (ref 15–41)
Albumin: 2.6 g/dL — ABNORMAL LOW (ref 3.5–5.0)
Alkaline Phosphatase: 43 U/L (ref 38–126)
Anion gap: 9 (ref 5–15)
BUN: 13 mg/dL (ref 8–23)
CO2: 27 mmol/L (ref 22–32)
Calcium: 8.1 mg/dL — ABNORMAL LOW (ref 8.9–10.3)
Chloride: 99 mmol/L (ref 98–111)
Creatinine, Ser: 0.87 mg/dL (ref 0.44–1.00)
GFR, Estimated: >60 mL/min (ref >60)
Glucose, Bld: 199 mg/dL — ABNORMAL HIGH (ref 70–99)
Potassium: 3.7 mmol/L (ref 3.5–5.1)
Sodium: 135 mmol/L (ref 135–145)
Total Bilirubin: 1 mg/dL (ref 0.3–1.2)
Total Protein: 6.5 g/dL (ref 6.5–8.1)

## 2022-09-22 LAB — RESP PANEL BY RT-PCR (RSV, FLU A&B, COVID)  RVPGX2
Influenza A by PCR: NEGATIVE
Influenza B by PCR: NEGATIVE
Resp Syncytial Virus by PCR: NEGATIVE
SARS Coronavirus 2 by RT PCR: NEGATIVE

## 2022-09-22 LAB — CBC WITH DIFFERENTIAL/PLATELET
Abs Immature Granulocytes: 0.5 10*3/uL — ABNORMAL HIGH (ref 0.00–0.07)
Band Neutrophils: 4 %
Basophils Absolute: 0 10*3/uL (ref 0.0–0.1)
Basophils Relative: 0 %
Eosinophils Absolute: 0 10*3/uL (ref 0.0–0.5)
Eosinophils Relative: 0 %
HCT: 31.6 % — ABNORMAL LOW (ref 36.0–46.0)
Hemoglobin: 9.9 g/dL — ABNORMAL LOW (ref 12.0–15.0)
Lymphocytes Relative: 11 %
Lymphs Abs: 0.5 10*3/uL — ABNORMAL LOW (ref 0.7–4.0)
MCH: 31.4 pg (ref 26.0–34.0)
MCHC: 31.3 g/dL (ref 30.0–36.0)
MCV: 100.3 fL — ABNORMAL HIGH (ref 80.0–100.0)
Metamyelocytes Relative: 10 %
Monocytes Absolute: 0.8 10*3/uL (ref 0.1–1.0)
Monocytes Relative: 16 %
Neutro Abs: 3.1 10*3/uL (ref 1.7–7.7)
Neutrophils Relative %: 59 %
Platelets: 196 10*3/uL (ref 150–400)
RBC: 3.15 MIL/uL — ABNORMAL LOW (ref 3.87–5.11)
RDW: 16 % — ABNORMAL HIGH (ref 11.5–15.5)
WBC: 4.9 10*3/uL (ref 4.0–10.5)
nRBC: 0 % (ref 0.0–0.2)

## 2022-09-22 LAB — APTT: aPTT: 36 seconds (ref 24–36)

## 2022-09-22 LAB — GLUCOSE, CAPILLARY
Glucose-Capillary: 36 mg/dL — CL (ref 70–99)
Glucose-Capillary: 196 mg/dL — ABNORMAL HIGH (ref 70–99)

## 2022-09-22 LAB — CBG MONITORING, ED
Glucose-Capillary: 45 mg/dL — ABNORMAL LOW (ref 70–99)
Glucose-Capillary: 129 mg/dL — ABNORMAL HIGH (ref 70–99)
Glucose-Capillary: 76 mg/dL (ref 70–99)
Glucose-Capillary: 42 mg/dL — CL (ref 70–99)
Glucose-Capillary: 65 mg/dL — ABNORMAL LOW (ref 70–99)
Glucose-Capillary: 70 mg/dL (ref 70–99)

## 2022-09-22 LAB — LACTIC ACID, PLASMA
Lactic Acid, Venous: 1.7 mmol/L (ref 0.5–1.9)
Lactic Acid, Venous: 0.9 mmol/L (ref 0.5–1.9)

## 2022-09-22 LAB — TROPONIN I (HIGH SENSITIVITY): Troponin I (High Sensitivity): 49 ng/L — ABNORMAL HIGH (ref <18)

## 2022-09-22 LAB — PROCALCITONIN: Procalcitonin: 0.25 ng/mL

## 2022-09-22 MED ORDER — AMITRIPTYLINE HCL 25 MG PO TABS
50.0000 mg | ORAL_TABLET | Freq: Every day | ORAL | Status: DC
  Administered 2022-09-22 – 2022-09-26 (×5): 50 mg via ORAL
  Filled 2022-09-22 (×5): qty 2

## 2022-09-22 MED ORDER — EZETIMIBE 10 MG PO TABS
10.0000 mg | ORAL_TABLET | Freq: Every day | ORAL | Status: DC
  Administered 2022-09-23 – 2022-09-27 (×5): 10 mg via ORAL
  Filled 2022-09-22 (×5): qty 1

## 2022-09-22 MED ORDER — ONDANSETRON HCL 4 MG/2ML IJ SOLN
4.0000 mg | Freq: Four times a day (QID) | INTRAMUSCULAR | Status: DC | PRN
  Administered 2022-09-27: 4 mg via INTRAVENOUS
  Filled 2022-09-22: qty 2

## 2022-09-22 MED ORDER — DEXTROSE 50 % IV SOLN
50.0000 mL | Freq: Once | INTRAVENOUS | Status: AC
  Administered 2022-09-22: 50 mL via INTRAVENOUS
  Filled 2022-09-22: qty 50

## 2022-09-22 MED ORDER — SODIUM CHLORIDE 0.9 % IV BOLUS (SEPSIS)
1000.0000 mL | Freq: Once | INTRAVENOUS | Status: AC
  Administered 2022-09-22: 1000 mL via INTRAVENOUS

## 2022-09-22 MED ORDER — ONDANSETRON HCL 4 MG PO TABS: 4.0000 mg | ORAL_TABLET | Freq: Four times a day (QID) | ORAL | Status: DC | PRN

## 2022-09-22 MED ORDER — ACETAMINOPHEN 325 MG PO TABS
650.0000 mg | ORAL_TABLET | Freq: Four times a day (QID) | ORAL | Status: DC | PRN
  Administered 2022-09-25 – 2022-09-26 (×2): 650 mg via ORAL
  Filled 2022-09-22 (×2): qty 2

## 2022-09-22 MED ORDER — METOPROLOL SUCCINATE ER 25 MG PO TB24
25.0000 mg | ORAL_TABLET | Freq: Every day | ORAL | Status: DC
  Administered 2022-09-22 – 2022-09-24 (×3): 25 mg via ORAL
  Filled 2022-09-22 (×3): qty 1

## 2022-09-22 MED ORDER — ACETAMINOPHEN 325 MG PO TABS
650.0000 mg | ORAL_TABLET | Freq: Once | ORAL | Status: AC
  Administered 2022-09-22: 650 mg via ORAL
  Filled 2022-09-22: qty 2

## 2022-09-22 MED ORDER — LOSARTAN POTASSIUM 50 MG PO TABS
100.0000 mg | ORAL_TABLET | Freq: Every day | ORAL | Status: DC
  Administered 2022-09-22: 100 mg via ORAL
  Filled 2022-09-22: qty 2

## 2022-09-22 MED ORDER — IPRATROPIUM BROMIDE 0.02 % IN SOLN: 0.5000 mg | Freq: Four times a day (QID) | RESPIRATORY_TRACT | Status: DC

## 2022-09-22 MED ORDER — INSULIN GLARGINE-YFGN 100 UNIT/ML ~~LOC~~ SOLN: 15.0000 [IU] | Freq: Two times a day (BID) | SUBCUTANEOUS | Status: DC

## 2022-09-22 MED ORDER — INSULIN ASPART 100 UNIT/ML IJ SOLN
0.0000 [IU] | Freq: Three times a day (TID) | INTRAMUSCULAR | Status: DC
  Administered 2022-09-23: 3 [IU] via SUBCUTANEOUS
  Administered 2022-09-23 (×2): 8 [IU] via SUBCUTANEOUS
  Administered 2022-09-24 (×3): 3 [IU] via SUBCUTANEOUS
  Administered 2022-09-25: 2 [IU] via SUBCUTANEOUS
  Administered 2022-09-25 (×2): 3 [IU] via SUBCUTANEOUS
  Administered 2022-09-26: 5 [IU] via SUBCUTANEOUS
  Administered 2022-09-26: 2 [IU] via SUBCUTANEOUS
  Administered 2022-09-26: 5 [IU] via SUBCUTANEOUS
  Administered 2022-09-27: 8 [IU] via SUBCUTANEOUS
  Administered 2022-09-27: 3 [IU] via SUBCUTANEOUS

## 2022-09-22 MED ORDER — INSULIN ASPART 100 UNIT/ML IJ SOLN
0.0000 [IU] | Freq: Every day | INTRAMUSCULAR | Status: DC
  Administered 2022-09-23: 3 [IU] via SUBCUTANEOUS

## 2022-09-22 MED ORDER — ALBUTEROL SULFATE (2.5 MG/3ML) 0.083% IN NEBU: 2.5000 mg | INHALATION_SOLUTION | RESPIRATORY_TRACT | Status: DC | PRN

## 2022-09-22 MED ORDER — DEXTROSE 50 % IV SOLN
INTRAVENOUS | Status: AC
  Administered 2022-09-22: 50 mL
  Filled 2022-09-22: qty 50

## 2022-09-22 MED ORDER — MAGNESIUM OXIDE -MG SUPPLEMENT 400 (240 MG) MG PO TABS
400.0000 mg | ORAL_TABLET | Freq: Every day | ORAL | Status: DC
  Administered 2022-09-23 – 2022-09-27 (×5): 400 mg via ORAL
  Filled 2022-09-22 (×11): qty 1

## 2022-09-22 MED ORDER — SODIUM CHLORIDE 0.9 % IV SOLN
2.0000 g | INTRAVENOUS | Status: DC
  Administered 2022-09-22 – 2022-09-25 (×4): 2 g via INTRAVENOUS
  Filled 2022-09-22 (×4): qty 20

## 2022-09-22 MED ORDER — INSULIN GLARGINE-YFGN 100 UNIT/ML ~~LOC~~ SOLN
12.0000 [IU] | Freq: Two times a day (BID) | SUBCUTANEOUS | Status: DC
  Administered 2022-09-23 – 2022-09-27 (×8): 12 [IU] via SUBCUTANEOUS
  Filled 2022-09-22 (×13): qty 0.12

## 2022-09-22 MED ORDER — MELATONIN 5 MG PO TABS
5.0000 mg | ORAL_TABLET | Freq: Every day | ORAL | Status: DC
  Filled 2022-09-22 (×5): qty 1

## 2022-09-22 MED ORDER — ASPIRIN 81 MG PO TBEC
81.0000 mg | DELAYED_RELEASE_TABLET | Freq: Every day | ORAL | Status: DC
  Administered 2022-09-22: 81 mg via ORAL
  Filled 2022-09-22: qty 1

## 2022-09-22 MED ORDER — PREDNISONE 10 MG PO TABS
10.0000 mg | ORAL_TABLET | Freq: Every day | ORAL | Status: DC
  Administered 2022-09-23 – 2022-09-25 (×3): 10 mg via ORAL
  Filled 2022-09-22 (×3): qty 1

## 2022-09-22 MED ORDER — HEPARIN SODIUM (PORCINE) 5000 UNIT/ML IJ SOLN
5000.0000 [IU] | Freq: Three times a day (TID) | INTRAMUSCULAR | Status: DC
  Administered 2022-09-22 – 2022-09-23 (×2): 5000 [IU] via SUBCUTANEOUS
  Filled 2022-09-22 (×2): qty 1

## 2022-09-22 MED ORDER — SODIUM CHLORIDE 0.9 % IV SOLN
500.0000 mg | INTRAVENOUS | Status: DC
  Administered 2022-09-22 – 2022-09-24 (×3): 500 mg via INTRAVENOUS
  Filled 2022-09-22 (×3): qty 5

## 2022-09-22 MED ORDER — ALBUTEROL SULFATE (2.5 MG/3ML) 0.083% IN NEBU: 2.5000 mg | INHALATION_SOLUTION | Freq: Four times a day (QID) | RESPIRATORY_TRACT | Status: DC

## 2022-09-22 MED ORDER — IPRATROPIUM-ALBUTEROL 0.5-2.5 (3) MG/3ML IN SOLN
3.0000 mL | Freq: Once | RESPIRATORY_TRACT | Status: AC
  Administered 2022-09-22: 3 mL via RESPIRATORY_TRACT
  Filled 2022-09-22: qty 3

## 2022-09-22 MED ORDER — IPRATROPIUM-ALBUTEROL 0.5-2.5 (3) MG/3ML IN SOLN
3.0000 mL | Freq: Four times a day (QID) | RESPIRATORY_TRACT | Status: DC
  Administered 2022-09-22 – 2022-09-25 (×9): 3 mL via RESPIRATORY_TRACT
  Filled 2022-09-22 (×10): qty 3

## 2022-09-22 MED ORDER — SERTRALINE HCL 50 MG PO TABS
25.0000 mg | ORAL_TABLET | Freq: Every day | ORAL | Status: DC
  Administered 2022-09-22 – 2022-09-27 (×6): 25 mg via ORAL
  Filled 2022-09-22 (×6): qty 1

## 2022-09-22 MED ORDER — AZATHIOPRINE 50 MG PO TABS
150.0000 mg | ORAL_TABLET | Freq: Every day | ORAL | Status: DC
  Administered 2022-09-22 – 2022-09-27 (×4): 150 mg via ORAL
  Filled 2022-09-22 (×8): qty 3

## 2022-09-22 MED ORDER — IOHEXOL 350 MG/ML SOLN
75.0000 mL | Freq: Once | INTRAVENOUS | Status: AC | PRN
  Administered 2022-09-22: 75 mL via INTRAVENOUS

## 2022-09-22 MED ORDER — METHYLPREDNISOLONE SODIUM SUCC 125 MG IJ SOLR
125.0000 mg | Freq: Once | INTRAMUSCULAR | Status: AC
  Administered 2022-09-22: 125 mg via INTRAVENOUS
  Filled 2022-09-22: qty 2

## 2022-09-22 NOTE — ED Notes (Signed)
NT informed this RN that pts CBG 42. RN to assess pt, pt in NAD. A & O, able to tolerate liquids and solids by mouth. Pt given 2 cups of orange juice, [place] sandwich and chips.  NT to stay with pt to assist with feeding.   MD notified.

## 2022-09-22 NOTE — ED Notes (Addendum)
Urine sample was unable to be obtained due to pt positioning. pt informed of need for sample and will call when she is ready.

## 2022-09-22 NOTE — ED Triage Notes (Signed)
Pt came in via [facility] from [facility]. Complaints of cough, fever, hypotension. Temp of 102.9 oral. [facility] stated they were unable to give her any medication for the fever.

## 2022-09-22 NOTE — ED Provider Notes (Signed)
   Procedures  .Critical Care  Performed by: Lonell Grandchild, MD Authorized by: Lonell Grandchild, MD   Critical care provider statement:    Critical care time (minutes):  30   Critical care was necessary to treat or prevent imminent or life-threatening deterioration of the following conditions:  Sepsis   Critical care was time spent personally by me on the following activities:  Development of treatment plan with patient or surrogate, discussions with consultants, evaluation of patient's response to treatment, examination of patient, ordering and review of laboratory studies, ordering and review of radiographic studies, ordering and performing treatments and interventions, pulse oximetry, re-evaluation of patient's condition and review of old charts   I assumed direction of critical care for this patient from another provider in my specialty: yes     Care discussed with: admitting provider     ED Course / MDM   Clinical Course as of 09/22/22 1827  Wed Sep 22, 2022  1537 Recevied sign out from Dr. Criss Alvine pending CT head and CTA chest. Newly hypoxic and febrile, confused. Suspect infection [WS]  1721 CT head negative within limitations of study with significant artifact.  CT chest also limited due to motion, does have some opacities and nodules which could possibly represent a pneumonia.  No large central PE.  Will discuss with hospitalist.  Still pending urinalysis. [WS]  1738 Discussed with Dr. Devra Dopp who will admit for sepsis, possible pneumonia. Pending urinalysis [WS]    Clinical Course User Index [WS] Lonell Grandchild, MD   Medical Decision Making Amount and/or Complexity of Data Reviewed Labs: ordered. Radiology: ordered. ECG/medicine tests: ordered.  Risk OTC drugs. Prescription drug management. Decision regarding hospitalization.          Lonell Grandchild, MD 09/22/22 [phone]

## 2022-09-22 NOTE — Sepsis Progress Note (Signed)
Notified bedside nurse of need to draw and administer antibiotics, lactic acid, blood cultures, and fluid bolus.   

## 2022-09-22 NOTE — Sepsis Progress Note (Signed)
Code Sepsis protocol being monitored by eLink. 

## 2022-09-22 NOTE — ED Provider Notes (Signed)
[place] [facility] AT [facility] Provider Note   CSN: [id number] Arrival date & time: 09/22/22  1128     History  Chief Complaint  Patient presents with  . Fever  . Hypotension    [name] is a 77 y.o. female.  HPI 77 year old female presents with a fever.  Patient is currently confused which limits the history.  History is mostly obtained from her nursing facility, [facility].  Patient is in assisted living but is normally sharp mentally.  She has had a cough for couple days, nonproductive.  Saw the provider yesterday and an x-ray was ordered but refused last night by the patient.  This morning she was complaining of a sore throat, had a temp of 102.9, and was unable to swallow her meds this morning.  She was found to be 88% on room air.  EMS put her on 2 L and when she arrived here oxygen was removed but she went back down to 87.  Her blood pressure at the facility was 97/59.  Her glucose was also mildly low but seem to come up to 61.  Cough for a couple days, non productive. Saw provider, xray ordered but refused by patient. Unable to swallow meds this AM. Sore throat, 102.9 temp. O2 88% on RA. BP 97/59. Assisted living, she's disoriented today. Glucose 48, then 61.  Home Medications Prior to Admission medications   Medication Sig Start Date End Date Taking? Authorizing Provider  acetaminophen (TYLENOL) 325 MG tablet Take 650 mg by mouth every 6 (six) hours as needed.    [provider]  albuterol (VENTOLIN HFA) 108 (90 Base) MCG/ACT inhaler Inhale 2 puffs into the lungs every 6 (six) hours as needed for wheezing or shortness of breath. 07/02/21   Vassie Loll, MD  amitriptyline (ELAVIL) 50 MG tablet Take 50 mg by mouth at bedtime.    [provider]  ascorbic acid (VITAMIN C) 500 MG tablet Take 1 tablet (500 mg total) by mouth daily. 07/03/21   Vassie Loll, MD  aspirin 81 MG tablet Take 81 mg by mouth at bedtime.     [provider]  azaTHIOprine (IMURAN) 50 MG tablet Take 3 tablets (150 mg total) by mouth daily. 04/28/22   Carlan, Chelsea L, NP  calcium carbonate (TUMS - DOSED IN MG ELEMENTAL CALCIUM) 500 MG chewable tablet Chew 1,000 mg by mouth every 8 (eight) hours as needed for heartburn.    [provider]  Cholecalciferol 50 MCG (2000 UT) TABS Take 2,000 Units by mouth daily at 6 (six) AM.    [provider]  ezetimibe (ZETIA) 10 MG tablet Take 1 tablet (10 mg total) by mouth daily. 12/29/21   Antoine Poche, MD  gabapentin (NEURONTIN) 100 MG capsule Take 500 mg by mouth 3 (three) times daily.  11/03/19   [provider]  glipiZIDE (GLUCOTROL) 5 MG tablet Take 5 mg by mouth 2 (two) times daily. 10/02/19   [provider]  insulin lispro (HUMALOG) 100 UNIT/ML KwikPen Inject 2-15 Units into the skin 3 (three) times daily before meals. 121-150=2 units, 151-200= 3 units, 201-250 = 5 units, 251-300= 8 units, 301-400 = 15 units    [provider]  LANTUS SOLOSTAR 100 UNIT/ML Solostar Pen Inject 20 Units into the skin 2 (two) times daily. Prime pen with 2u prior to each use 06/22/20   [provider]  levothyroxine (SYNTHROID) 112 MCG tablet Take 137 mcg by  mouth in the morning.    [provider]  lidocaine (LIDODERM) 5 % Place 1 patch onto the skin daily. 06/09/21   [provider]  losartan (COZAAR) 100 MG tablet Take 100 mg by mouth at bedtime.     [provider]  magnesium oxide (MAG-OX) 400 MG tablet Take 400 mg by mouth daily.    [provider]  Melatonin 5 MG CAPS Take 5 mg by mouth at bedtime.    [provider]  methocarbamol (ROBAXIN) 500 MG tablet Take 1 tablet (500 mg total) by mouth every 8 (eight) hours as needed for muscle spasms. 07/02/21   Vassie Loll, MD  metoprolol succinate (TOPROL XL) 25 MG 24 hr tablet Take 1 tablet (25 mg total) by mouth daily. 07/03/20   Antoine Poche, MD   pantoprazole (PROTONIX) 40 MG tablet Take 40 mg by mouth 2 (two) times daily.    [provider]  Phenylephrine-Mineral Oil-Pet (HEMORRHOIDAL) 0.25-14-71.9 % OINT Place 1 application rectally every 6 (six) hours as needed (hemorrhoid discomfort).    [provider]  predniSONE (DELTASONE) 5 MG tablet Take 2 tablets (10 mg total) by mouth daily with breakfast. 09/03/22   Carlan, Chelsea L, NP  sertraline (ZOLOFT) 25 MG tablet Take 25 mg by mouth daily. 06/09/21   [provider]  sulfamethoxazole-trimethoprim (BACTRIM DS) 800-160 MG tablet Take 1 tablet by mouth 2 (two) times daily.    [provider]  tiZANidine (ZANAFLEX) 4 MG tablet Take 4 mg by mouth at bedtime.    [provider]  zinc sulfate 220 (50 Zn) MG capsule Take 1 capsule (220 mg total) by mouth daily. 07/03/21   Vassie Loll, MD      Allergies    Codeine    Review of Systems   Review of Systems  Constitutional:  Positive for fever.  Respiratory:  Positive for cough.     Physical Exam Updated Vital Signs BP (!) 140/68 (BP Location: Left Arm)   Pulse 88   Temp 99.5 F (37.5 C) (Oral)   Resp (!) 29   SpO2 100%  Physical Exam Vitals and nursing note reviewed.  Constitutional:      Appearance: She is well-developed.  HENT:     Head: Normocephalic and atraumatic.  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     Heart sounds: Normal heart sounds.  Pulmonary:     Effort: Pulmonary effort is normal.     Breath sounds: Rhonchi and rales present.  Abdominal:     General: There is no distension.     Palpations: Abdomen is soft.     Tenderness: There is no abdominal tenderness.  Musculoskeletal:     Cervical back: No rigidity.  Skin:    General: Skin is warm and dry.  Neurological:     Mental Status: She is alert. She is disoriented.     Comments: Patient has equal movement in all 4 extremities. She  is overall confused and disoriented.     ED Results / Procedures / Treatments   Labs (all labs ordered are listed, but only abnormal results are displayed) Labs Reviewed  COMPREHENSIVE METABOLIC PANEL - Abnormal; Notable for the following components:      Result Value   Glucose, Bld 199 (*)    Calcium 8.1 (*)    Albumin 2.6 (*)    All other components within  normal limits  CBC WITH DIFFERENTIAL/PLATELET - Abnormal; Notable for the following components:   RBC 3.15 (*)    Hemoglobin 9.9 (*)    HCT 31.6 (*)    MCV 100.3 (*)    RDW 16.0 (*)    Lymphs Abs 0.5 (*)    Abs Immature Granulocytes 0.50 (*)    All other components within normal limits  PROTIME-INR - Abnormal; Notable for the following components:   Prothrombin Time 15.5 (*)    All other components within normal limits  CBG MONITORING, ED - Abnormal; Notable for the following components:   Glucose-Capillary 45 (*)    All other components within normal limits  CBG MONITORING, ED - Abnormal; Notable for the following components:   Glucose-Capillary 129 (*)    All other components within normal limits  TROPONIN I (HIGH SENSITIVITY) - Abnormal; Notable for the following components:   Troponin I (High Sensitivity) 49 (*)    All other components within normal limits  RESP PANEL BY RT-PCR (RSV, FLU A&B, COVID)  RVPGX2  CULTURE, BLOOD (ROUTINE X 2)  CULTURE, BLOOD (ROUTINE X 2)  LACTIC ACID, PLASMA  APTT  LACTIC ACID, PLASMA  URINALYSIS, W/ REFLEX TO CULTURE (INFECTION SUSPECTED)  CBG MONITORING, ED    EKG EKG Interpretation  Date/Time:  Wednesday September 22 2022 12:39:36 EDT Ventricular Rate:  86 PR Interval:  117 QRS Duration: 87 QT Interval:  333 QTC Calculation: 399 R Axis:   36 Text Interpretation: Sinus rhythm Multiple premature complexes, vent & supraven Borderline short PR interval Abnormal R-wave progression, early transition Repol abnrm suggests ischemia, diffuse leads Confirmed by Pricilla Loveless [phone]) on  09/22/2022 1:27:16 PM  Radiology DG Chest Port 1 View  Result Date: 09/22/2022 CLINICAL DATA:  Sepsis EXAM: PORTABLE CHEST 1 VIEW COMPARISON:  X-ray 02/03/2019 FINDINGS: Enlarged cardiopericardial silhouette with some slight vascular congestion and interstitial changes. Coronary stents are presumed. No consolidation, pneumothorax or effusion. Surgical clips in the left axillary region. IMPRESSION: Enlarged cardiac silhouette with some vascular congestion. Electronically Signed   By: Karen Kays M.D.   On: 09/22/2022 12:21    Procedures .Critical Care  Performed by: Pricilla Loveless, MD Authorized by: Pricilla Loveless, MD   Critical care provider statement:    Critical care time (minutes):  40   Critical care time was exclusive of:  Separately billable procedures and treating other patients   Critical care was necessary to treat or prevent imminent or life-threatening deterioration of the following conditions:  Respiratory failure and sepsis   Critical care was time spent personally by me on the following activities:  Development of treatment plan with patient or surrogate, discussions with consultants, evaluation of patient's response to treatment, examination of patient, ordering and review of laboratory studies, ordering and review of radiographic studies, ordering and performing treatments and interventions, pulse oximetry, re-evaluation of patient's condition and review of old charts     Medications Ordered in ED Medications  cefTRIAXone (ROCEPHIN) 2 g in sodium chloride 0.9 % 100 mL IVPB (2 g Intravenous New Bag/Given 09/22/22 1241)  azithromycin (ZITHROMAX) 500 mg in sodium chloride 0.9 % 250 mL IVPB (500 mg Intravenous New Bag/Given 09/22/22 1259)  ipratropium-albuterol (DUONEB) 0.5-2.5 (3) MG/3ML nebulizer solution 3 mL (has no administration in time range)  acetaminophen (TYLENOL) tablet 650 mg (650 mg Oral Given 09/22/22 1242)  sodium chloride 0.9 % bolus 1,000 mL (0 mLs Intravenous  Stopped 09/22/22 1342)  dextrose 50 % solution 50 mL (50 mLs Intravenous Given 09/22/22  [id number])    ED Course/ Medical Decision Making/ A&P Clinical Course as of 09/22/22 1539  Wed Sep 22, 2022  1537 Recevied sign out from Dr. Criss Alvine pending CT head and CTA chest. Newly hypoxic and febrile, confused. Suspect infection [WS]    Clinical Course User Index [WS] Lonell Grandchild, MD                             Medical Decision Making Amount and/or Complexity of Data Reviewed Labs: ordered.    Details: Normal WBC but abnormal left shift.  Hypoglycemia. Radiology: ordered and independent interpretation performed.    Details: No lobar pneumonia. ECG/medicine tests: ordered.  Risk OTC drugs. Prescription drug management.   Patient has new respiratory failure requiring 2 L of oxygen.  X-ray is overall unremarkable though with her fever and new hypoxia and altered mental status I am suspecting pneumonia.  Will do a DuoNeb given some coarse breath sounds/wheezing.  However with her degree of hypoxia and is relatively clear lungs, will get CT for further evaluation while simultaneously treating her with IV antibiotics.  Also get CT head as while she seems a little improved from a mental status standpoint with glucose correction and Tylenol for fever, she is still altered.  She will need admission. CT's currently pending, care transferred to Dr. Suezanne Jacquet.         Final Clinical Impression(s) / ED Diagnoses Final diagnoses:  Acute respiratory failure with hypoxia    Rx / DC Orders ED Discharge Orders     None         Pricilla Loveless, MD 09/22/22 1539
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
## 2022-09-22 NOTE — ED Notes (Signed)
Pt linen changed, peri care performed.

## 2022-09-22 NOTE — ED Notes (Signed)
Pt continues to move, yell, and talk when BP is taking, this RN attempted x 3 to obtain a BP, will continue to monitor

## 2022-09-23 DIAGNOSIS — I480 Paroxysmal atrial fibrillation: Secondary | ICD-10-CM | POA: Diagnosis not present

## 2022-09-23 DIAGNOSIS — I4891 Unspecified atrial fibrillation: Secondary | ICD-10-CM | POA: Diagnosis not present

## 2022-09-23 DIAGNOSIS — I2510 Atherosclerotic heart disease of native coronary artery without angina pectoris: Secondary | ICD-10-CM | POA: Diagnosis not present

## 2022-09-23 DIAGNOSIS — E7849 Other hyperlipidemia: Secondary | ICD-10-CM | POA: Diagnosis not present

## 2022-09-23 DIAGNOSIS — J189 Pneumonia, unspecified organism: Secondary | ICD-10-CM | POA: Diagnosis not present

## 2022-09-23 LAB — BASIC METABOLIC PANEL
Anion gap: 14 (ref 5–15)
BUN: 11 mg/dL (ref 8–23)
CO2: 23 mmol/L (ref 22–32)
Calcium: 8.6 mg/dL — ABNORMAL LOW (ref 8.9–10.3)
Chloride: 100 mmol/L (ref 98–111)
Creatinine, Ser: 0.66 mg/dL (ref 0.44–1.00)
GFR, Estimated: >60 mL/min (ref >60)
Glucose, Bld: 246 mg/dL — ABNORMAL HIGH (ref 70–99)
Potassium: 4.6 mmol/L (ref 3.5–5.1)
Sodium: 137 mmol/L (ref 135–145)

## 2022-09-23 LAB — CBC
HCT: 36.5 % (ref 36.0–46.0)
Hemoglobin: 11.5 g/dL — ABNORMAL LOW (ref 12.0–15.0)
MCH: 31.9 pg (ref 26.0–34.0)
MCHC: 31.5 g/dL (ref 30.0–36.0)
MCV: 101.1 fL — ABNORMAL HIGH (ref 80.0–100.0)
Platelets: 213 10*3/uL (ref 150–400)
RBC: 3.61 MIL/uL — ABNORMAL LOW (ref 3.87–5.11)
RDW: 16.1 % — ABNORMAL HIGH (ref 11.5–15.5)
WBC: 3.9 10*3/uL — ABNORMAL LOW (ref 4.0–10.5)
nRBC: 0 % (ref 0.0–0.2)

## 2022-09-23 LAB — RESPIRATORY PANEL BY PCR

## 2022-09-23 LAB — HEMOGLOBIN A1C
Hgb A1c MFr Bld: 10.1 % — ABNORMAL HIGH (ref 4.8–5.6)
Mean Plasma Glucose: 243 mg/dL

## 2022-09-23 LAB — URINE CULTURE: Culture: <10000 — AB

## 2022-09-23 LAB — GLUCOSE, CAPILLARY
Glucose-Capillary: 164 mg/dL — ABNORMAL HIGH (ref 70–99)
Glucose-Capillary: 184 mg/dL — ABNORMAL HIGH (ref 70–99)
Glucose-Capillary: 194 mg/dL — ABNORMAL HIGH (ref 70–99)
Glucose-Capillary: 267 mg/dL — ABNORMAL HIGH (ref 70–99)
Glucose-Capillary: 280 mg/dL — ABNORMAL HIGH (ref 70–99)
Glucose-Capillary: 297 mg/dL — ABNORMAL HIGH (ref 70–99)

## 2022-09-23 LAB — TSH: TSH: 0.237 u[IU]/mL — ABNORMAL LOW (ref 0.350–4.500)

## 2022-09-23 LAB — MRSA NEXT GEN BY PCR, NASAL: MRSA by PCR Next Gen: DETECTED — AB

## 2022-09-23 LAB — LEGIONELLA PNEUMOPHILA SEROGP 1 UR AG: L. pneumophila Serogp 1 Ur Ag: NEGATIVE

## 2022-09-23 LAB — CULTURE, BLOOD (ROUTINE X 2)

## 2022-09-23 LAB — STREP PNEUMONIAE URINARY ANTIGEN: Strep Pneumo Urinary Antigen: NEGATIVE

## 2022-09-23 MED ORDER — APIXABAN 5 MG PO TABS
5.0000 mg | ORAL_TABLET | Freq: Two times a day (BID) | ORAL | Status: DC
  Administered 2022-09-23 – 2022-09-27 (×9): 5 mg via ORAL
  Filled 2022-09-23 (×10): qty 1

## 2022-09-23 MED ORDER — METOPROLOL TARTRATE 5 MG/5ML IV SOLN
5.0000 mg | Freq: Once | INTRAVENOUS | Status: AC
  Administered 2022-09-23: 5 mg via INTRAVENOUS

## 2022-09-23 MED ORDER — LEVOTHYROXINE SODIUM 112 MCG PO TABS: 112.0000 ug | ORAL_TABLET | Freq: Every morning | ORAL | Status: DC

## 2022-09-23 MED ORDER — METOPROLOL TARTRATE 5 MG/5ML IV SOLN
INTRAVENOUS | Status: AC
  Administered 2022-09-23: 5 mg
  Filled 2022-09-23: qty 5

## 2022-09-23 MED ORDER — LEVOTHYROXINE SODIUM 100 MCG PO TABS
100.0000 ug | ORAL_TABLET | Freq: Every morning | ORAL | Status: DC
  Administered 2022-09-24 – 2022-09-27 (×4): 100 ug via ORAL
  Filled 2022-09-23 (×4): qty 1

## 2022-09-23 MED ORDER — MUPIROCIN 2 % EX OINT
1.0000 | TOPICAL_OINTMENT | Freq: Two times a day (BID) | CUTANEOUS | Status: DC
  Administered 2022-09-23 – 2022-09-27 (×8): 1 via NASAL
  Filled 2022-09-23 (×2): qty 22

## 2022-09-23 MED ORDER — GUAIFENESIN-DM 100-10 MG/5ML PO SYRP
5.0000 mL | ORAL_SOLUTION | ORAL | Status: DC | PRN
  Administered 2022-09-23 – 2022-09-24 (×4): 5 mL via ORAL
  Filled 2022-09-23 (×4): qty 5

## 2022-09-23 MED ORDER — DILTIAZEM LOAD VIA INFUSION
15.0000 mg | Freq: Once | INTRAVENOUS | Status: AC
  Administered 2022-09-23: 15 mg via INTRAVENOUS
  Filled 2022-09-23: qty 15

## 2022-09-23 MED ORDER — DILTIAZEM HCL-DEXTROSE 125-5 MG/125ML-% IV SOLN (PREMIX)
5.0000 mg/h | INTRAVENOUS | Status: DC
  Administered 2022-09-23 – 2022-09-24 (×2): 5 mg/h via INTRAVENOUS
  Filled 2022-09-23 (×5): qty 125

## 2022-09-23 MED ORDER — CHLORHEXIDINE GLUCONATE CLOTH 2 % EX PADS
6.0000 | MEDICATED_PAD | Freq: Every day | CUTANEOUS | Status: DC
  Administered 2022-09-23 – 2022-09-27 (×5): 6 via TOPICAL

## 2022-09-23 MED ORDER — CHLORHEXIDINE GLUCONATE CLOTH 2 % EX PADS
6.0000 | MEDICATED_PAD | Freq: Every day | CUTANEOUS | Status: DC
  Administered 2022-09-24: 6 via TOPICAL
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
## 2022-09-23 NOTE — Progress Notes (Signed)
   09/23/22 0938  Vitals  BP (!) 152/85  MAP (mmHg) 106  BP Location Right Arm  BP Method Automatic  Patient Position (if appropriate) Lying  Pulse Rate (!) 157  Pulse Rate Source Monitor  Resp 20  Level of Consciousness  Level of Consciousness Alert  MEWS COLOR  MEWS Score Color Yellow  Oxygen Therapy  SpO2 99 %  O2 Device Room Air  Pain Assessment  Pain Scale 0-10  Pain Score 5  Pain Type Acute pain  Pain Location Abdomen  Pain Orientation Right  PCA/Epidural/Spinal Assessment  Respiratory Pattern Regular  Glasgow Coma Scale  Eye Opening 4  Best Verbal Response (NON-intubated) 5  Best Motor Response 6  Glasgow Coma Scale Score 15  MEWS Score  MEWS Temp 0  MEWS Systolic 0  MEWS Pulse 3  MEWS RR 0  MEWS LOC 0  MEWS Score 3  Provider Notification  Provider Name/Title Jacquelin Hawking MD  Date Provider Notified 09/23/22  Time Provider Notified [phone]  Method of Notification Page  Notification Reason Other (Comment) (high pulse rate)  Provider response Other (Comment)  Date of Provider Response 09/23/22  Time of Provider Response [phone]

## 2022-09-23 NOTE — TOC Initial Note (Signed)
Transition of Care [facility]) - Initial/Assessment Note    Patient Details  Name: [name] MRN: [id number] Date of Birth: [birth year]  [facility] CM/SW Contact:    Elliot Gault, LCSW Phone Number: 09/23/2022, 2:49 PM  Clinical Narrative:                  Pt admitted from [facility] ALF and plan is for return there at dc unless SNF rehab is indicated.  Spoke with Olegario Messier at [facility] to update. Per Olegario Messier, pt does need assistance with ADLs and she states that pt uses a wheelchair for ambulation mostly due to her blindness. Olegario Messier also states that pt has had some cognitive changes recently that she wonders if are related to mild dementia.  Will follow up with pt's family when dc timeframe outlined by MD.  TOC will follow and assist as needed with dc planning.  Expected Discharge Plan: Assisted Living Barriers to Discharge: Continued Medical Work up   Patient Goals and CMS Choice Patient states their goals for this hospitalization and ongoing recovery are:: return to ALF          Expected Discharge Plan and Services In-house Referral: Clinical Social Work     Living arrangements for the past 2 months: Assisted Living Facility                                      Prior Living Arrangements/Services Living arrangements for the past 2 months: Assisted Living Facility Lives with:: Facility Resident Patient language and need for interpreter reviewed:: Yes Do you feel safe going back to the place where you live?: Yes      Need for Family Participation in Patient Care: No (Comment) Care giver support system in place?: Yes (comment) Current home services: DME Criminal Activity/Legal Involvement Pertinent to Current Situation/Hospitalization: No - Comment as needed  Activities of Daily Living   ADL Screening (condition at time of admission) Patient's cognitive ability adequate to safely complete daily activities?: No Is the patient deaf or have  difficulty hearing?: No Does the patient have difficulty seeing, even when wearing glasses/contacts?: Yes Does the patient have difficulty concentrating, remembering, or making decisions?: Yes Patient able to express need for assistance with ADLs?: Yes Does the patient have difficulty dressing or bathing?: Yes Independently performs ADLs?: No Does the patient have difficulty walking or climbing stairs?: Yes Weakness of Legs: Both Weakness of Arms/Hands: None  Permission Sought/Granted                  Emotional Assessment       Orientation: : Oriented to Self, Oriented to Place, Oriented to Situation Alcohol / Substance Use: Not Applicable Psych Involvement: No (comment)  Admission diagnosis:  Thrombocytopenia, unspecified [D69.6] Atherosclerosis of aorta [I70.0] Pneumonia [J18.9] Acute respiratory failure with hypoxia [J96.01] Poisoning by unspecified narcotics, intentional self-harm, initial encounter [T40.602A] Acute diastolic (congestive) heart failure [I50.31] Chronic obstructive pulmonary disease with (acute) exacerbation [J44.1] Unspecified adrenocortical insufficiency [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.40] Malignant neoplasm of lower-outer quadrant of left breast of female, estrogen receptor positive [C50.512, Z17.0] Patient Active Problem List   Diagnosis Date Noted   Paroxysmal atrial fibrillation with RVR 09/23/2022   Poisoning by unspecified narcotics, intentional self-harm, initial encounter 09/22/2022   Pneumonia 09/22/2022   Loose stools 09/14/2022   Bimalleolar fracture of right ankle 06/29/2021   Hypokalemia 06/29/2021   Hyperglycemia due to diabetes mellitus 06/29/2021  Insomnia 06/29/2021   Diabetic neuropathy 06/29/2021   COVID-19 virus infection 06/29/2021   Autoimmune hepatitis 01/20/2021   Elevated LFTs 12/11/2020   NASH (nonalcoholic steatohepatitis) 12/11/2020   Acute lower UTI 06/18/2018   Elevated troponin 06/18/2018   UTI (urinary tract infection) 06/18/2018    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 breast 06/21/2017   Mastalgia 06/21/2017   Acute diastolic CHF (congestive heart failure) 12/20/2016   Adrenal insufficiency 12/18/2016   Lobar pneumonia 12/15/2016   Acute metabolic encephalopathy 12/15/2016   Thrombocytopenia 12/15/2016   CKD (chronic kidney disease) stage 3, GFR 30-59 ml/min (HCC) 12/15/2016   Sepsis 12/14/2016   COPD exacerbation 04/24/2016   DM type 2 (diabetes mellitus, type 2) 04/24/2016   Aortic atherosclerosis 04/24/2016   Transaminitis 10/20/2014   Acute bronchitis 10/20/2014   Acute respiratory failure 10/17/2014   IBS (irritable bowel syndrome) 08/07/2012   Lower abdominal pain 10/05/2011   Coronary artery disease    Tobacco abuse, in remission    Gastroesophageal reflux disease    Obesity, Class II, BMI 35-39.9 04/01/2010   BLINDNESS 04/01/2010   Hypothyroidism 09/29/2009   Hyperlipidemia, unspecified 09/29/2009   Essential hypertension 09/29/2009   PCP:  Galvin Proffer, MD Pharmacy:   [facility] [place], [place] - [address] [place] [address] Phone: [phone] Fax: [phone]     Social Determinants of Health (SDOH) Social History: SDOH Screenings   Tobacco Use: Medium Risk (09/22/2022)   SDOH Interventions:     Readmission Risk Interventions    09/23/2022    2:48 PM  Readmission Risk Prevention Plan  Transportation Screening Complete  HRI or Home Care Consult Complete  Social Work Consult for Recovery Care Planning/Counseling Complete  Palliative Care Screening Not Applicable  Medication Review Oceanographer) Complete

## 2022-09-23 NOTE — Inpatient Diabetes Management (Signed)
Inpatient Diabetes Program Recommendations  AACE/ADA: New Consensus Statement on Inpatient Glycemic Control (2015)  Target Ranges:  Prepandial:   less than 140 mg/dL      Peak postprandial:   less than 180 mg/dL (1-2 hours)      Critically ill patients:  140 - 180 mg/dL   Lab Results  Component Value Date   GLUCAP 297 (H) 09/23/2022   HGBA1C 10.1 (H) 09/22/2022    Review of Glycemic Control  Diabetes history: DM2 Outpatient Diabetes medications: glipizide 5 mg BID, Lantus 25 in am and 15 QHS, Humalog 2-15 TID, Pred 10 mg QD Current orders for Inpatient glycemic control: Semglee 12 units BID, Novolog 0-15 TID with meals and 0-5 HS. On Pred 10 mg QD  HgbA1C - 10.1%  Inpatient Diabetes Program Recommendations:    May consider not restarting glipizide 5 mg BID for home meds when discharging, due to risk of hypoglycemia.  Will continue to follow.  Thank you. Ailene Ards, RD, LDN, CDCES Inpatient Diabetes Coordinator [phone]

## 2022-09-23 NOTE — Hospital Course (Addendum)
[name] is a 77 y.o. female with a history of COPD, hypertension, hyperlipidemia, hypothyroidism, CAD s/p stent placement, GERD, diabetes mellitus type 2, blindness, obesity. Patient presented secondary to fever and hypotension and was found to have evidence of pneumonia. Empiric antibiotic started.  Hospitalization complicated by development of atrial fibrillation with rapid ventricular rate.   Assessment and Plan:   Atypical pneumonia Patient with cough and infiltrates on imaging. Patient started empirically on Ceftriaxone and azithromycin. Blood cultures obtained. Sputum culture ordered. RVP panel ordered. -Continue Ceftriaxone and azithromycin. RVP negative -Follow-up blood cultures -Follow-up sputum culture -SLP evaluation   Acute respiratory failure with hypoxia Secondary to pneumonia.  -Wean to room air as able   Paroxysmal atrial fibrillation with RVR Newly diagnosed atrial fibrillation. Patient appears to have converted to atrial fibrillation on 4/25 at around 0930. Patient does report a history of palpitations. Last Transthoracic Echocardiogram from 2018 significant for normal LVEF. Home metoprolol succinate -Diltiazem bolus and infusion -Cardiology recommendations: D/Cing Cardizem gtt  10/24/22 Start IV digoxin loading with IV digoxin 0.5 mg followed by 0.25 mg every 6 hours. Start metoprolol tartrate 25 mg every 6 hours and stop metoprolol succinate.    -Eliquis 5 mg BID for stroke prophylaxis -Echocardiogram: : Left ventricular ejection fraction, by estimation, is 50 to 55%. The left ventricle has low normal function. Left atrial size was moderately dilated.  The mitral valve is grossly normal. Mild mitral valve regurgitation.    Acute metabolic encephalopathy Present on admission. Presumed secondary to infection.  Resolved.   Diabetic neuropathy -Continue home gabapentin   Diabetes mellitus type 2 Uncontrolled with hyperglycemia. Patient is managed on Humalog  sliding scale and Lantus 20 units BID as an outpatient. Current hemoglobin A1C of 10.1%.  Patient started on Semglee 12 units BID and SSI on admission -will increase Semglee to 15 units BID and SSI   Autoimmune hepatitis -Continue prednisone and Imuran   GERD -Continue Protonix   Primary hypertension Losartan held. Metoprolol discontinued by cardiology.  Will take off Diltiazem drip.   Hyperlipidemia -Continue Zetia   Hypothyroidism Patient is on Synthroid 112 mcg daily as an outpatient. TSH is low at 0.237, indicating need to decrease Synthroid dose. Likely contributing to RVR. -Decrease to Synthroid 100 mcg daily -Recheck TSH in 4-6 weeks as an outpatient   Blindness Stable.   Obesity Estimated body mass index is 35.49 kg/m as calculated from the following:   Height as of this encounter: 5\' 1"  (1.549 m).   Weight as of this encounter: 85.2 kg
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
## 2022-09-24 ENCOUNTER — Inpatient Hospital Stay (HOSPITAL_COMMUNITY): Payer: Medicare Other

## 2022-09-24 DIAGNOSIS — I2510 Atherosclerotic heart disease of native coronary artery without angina pectoris: Secondary | ICD-10-CM

## 2022-09-24 DIAGNOSIS — J189 Pneumonia, unspecified organism: Secondary | ICD-10-CM | POA: Diagnosis not present

## 2022-09-24 DIAGNOSIS — I4891 Unspecified atrial fibrillation: Secondary | ICD-10-CM

## 2022-09-24 DIAGNOSIS — E7849 Other hyperlipidemia: Secondary | ICD-10-CM

## 2022-09-24 LAB — ECHOCARDIOGRAM COMPLETE
Area-P 1/2: 4.12 cm2
Height: 61 in
S' Lateral: 3.3 cm
Weight: 3005.31 oz

## 2022-09-24 LAB — GLUCOSE, CAPILLARY
Glucose-Capillary: 192 mg/dL — ABNORMAL HIGH (ref 70–99)
Glucose-Capillary: 199 mg/dL — ABNORMAL HIGH (ref 70–99)
Glucose-Capillary: 191 mg/dL — ABNORMAL HIGH (ref 70–99)
Glucose-Capillary: 160 mg/dL — ABNORMAL HIGH (ref 70–99)

## 2022-09-24 LAB — CULTURE, BLOOD (ROUTINE X 2): Special Requests: ADEQUATE

## 2022-09-24 MED ORDER — MELATONIN 3 MG PO TABS
6.0000 mg | ORAL_TABLET | Freq: Every day | ORAL | Status: DC
  Administered 2022-09-24 – 2022-09-26 (×3): 6 mg via ORAL
  Filled 2022-09-24 (×3): qty 2

## 2022-09-24 MED ORDER — DIGOXIN 0.25 MG/ML IJ SOLN
INTRAMUSCULAR | Status: AC
  Filled 2022-09-24: qty 2

## 2022-09-24 MED ORDER — DIGOXIN 0.25 MG/ML IJ SOLN
0.5000 mg | Freq: Once | INTRAMUSCULAR | Status: AC
  Administered 2022-09-24: 0.5 mg via INTRAVENOUS
  Filled 2022-09-24: qty 2

## 2022-09-24 MED ORDER — METOPROLOL TARTRATE 25 MG PO TABS
25.0000 mg | ORAL_TABLET | Freq: Four times a day (QID) | ORAL | Status: DC
  Administered 2022-09-24 – 2022-09-26 (×8): 25 mg via ORAL
  Filled 2022-09-24 (×8): qty 1

## 2022-09-24 MED ORDER — HYDROCODONE BIT-HOMATROP MBR 5-1.5 MG/5ML PO SOLN
5.0000 mL | ORAL | Status: DC | PRN
  Administered 2022-09-24 – 2022-09-26 (×9): 5 mL via ORAL
  Filled 2022-09-24 (×9): qty 5
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
## 2022-09-24 NOTE — Progress Notes (Signed)
Definity was attempted, pt refused at this time.

## 2022-09-24 NOTE — TOC Progression Note (Signed)
Transition of Care [facility]) - Progression Note    Patient Details  Name: [name] MRN: [id number] Date of Birth: 1945/10/04  Transition [facility]) CM/SW Contact  Elliot Gault, LCSW Phone Number: 09/24/2022, 1:48 PM  Clinical Narrative:     TOC following. MD anticipating pt will be stable for dc in the next couple of days and she will have new prescriptions at dc. PT evaluated pt and said she may benefit from SNF rehab.  Spoke with Olegario Messier, Production designer, theatre/television/film at [facility], to update. Olegario Messier states that Monday would be the earliest they can take pt back due to new medication needs. She states that they will come assess pt over the weekend or Monday AM to make sure they can manage her care.   TOC spoke with pt to review above. Pt not currently agreeable to SNF referrals. She states she will return to [facility] and they can manage her care. It appears pt is her own decision maker currently. If [facility] thinks she should go to rehab first, TOC will follow up with pt to see if she will agree.  Will follow.  Expected Discharge Plan: Assisted Living Barriers to Discharge: Continued Medical Work up  Expected Discharge Plan and Services In-house Referral: Clinical Social Work     Living arrangements for the past 2 months: Assisted Living Facility                                       Social Determinants of Health (SDOH) Interventions SDOH Screenings   Tobacco Use: Medium Risk (09/22/2022)    Readmission Risk Interventions    09/23/2022    2:48 PM  Readmission Risk Prevention Plan  Transportation Screening Complete  HRI or Home Care Consult Complete  Social Work Consult for Recovery Care Planning/Counseling Complete  Palliative Care Screening Not Applicable  Medication Review Oceanographer) Complete

## 2022-09-24 NOTE — Plan of Care (Signed)

## 2022-09-24 NOTE — Evaluation (Signed)
Physical Therapy Evaluation Patient Details Name: [name] MRN: [id number] DOB: 03/29/1946 Today's Date: 09/24/2022  History of Present Illness  [name]  is a 77 y.o. female, with medical history significant for COPD, hypertension, hyperlipidemia, hypothyroidism, CAD s/p stent placement, GERD, T2DM, legally blind and obesity   -Patient was sent from her facility due to fever, and confusion, at baseline patient with good mentation, was noted to be confused, as well she had cough for last couple days, nonproductive, ordered x-ray yesterday at the facility, but patient declined that, this morning she reports sore throat, had fever 102.9, unable to swallow, saturating 88% on room air, so EMS were called, where she was started on 2 L of oxygen, blood pressure was noted to be 97/59 at the facility, her glucose was noted to be at 61, upon my evaluation patient is more oriented, unable to provide history, she reports cough, she denies any chest pain, dysuria or polyuria.  -in ED Was noted to be febrile 102.9, CTA chest with some nontypical nodularity, discerning for atypical pneumonia, blood pressure was stable in ED, no leukocytosis noted, lactic acid within normal limit, [facility] hospitalist consulted to admit   Clinical Impression  Patient limited for functional mobility as stated below secondary to BLE weakness, fatigue and impaired balance.  Patient able to transition to seated EOB with labored movement, cueing, and assist. She demonstrates good sitting tolerance and sitting balance at EOB. Limited activity today due to patient fatigue and nursing request for light mobility due to vitals when asked prior to session. Patient will benefit from continued physical therapy in hospital and recommended venue below to increase strength, balance, endurance for safe ADLs and gait.        Recommendations for follow up therapy are one component of a multi-disciplinary discharge planning process, led by the  attending physician.  Recommendations may be updated based on patient status, additional functional criteria and insurance authorization.  Follow Up Recommendations       Assistance Recommended at Discharge Intermittent Supervision/Assistance  Patient can return home with the following  A little help with walking and/or transfers;Assistance with cooking/housework;A little help with bathing/dressing/bathroom    Equipment Recommendations None recommended by PT  Recommendations for Other Services       Functional Status Assessment Patient has had a recent decline in their functional status and demonstrates the ability to make significant improvements in function in a reasonable and predictable amount of time.     Precautions / Restrictions Precautions Precautions: Fall Restrictions Weight Bearing Restrictions: No      Mobility  Bed Mobility Overal bed mobility: Needs Assistance Bed Mobility: Supine to Sit, Sit to Supine     Supine to sit: Min assist, HOB elevated Sit to supine: Min guard, HOB elevated   General bed mobility comments: slow, labored, cueing for sequencing    Transfers                        Ambulation/Gait                  Stairs            Wheelchair Mobility    Modified Rankin (Stroke Patients Only)       Balance Overall balance assessment: Mild deficits observed, not formally tested  Pertinent Vitals/Pain Pain Assessment Pain Assessment: No/denies pain    Home Living Family/patient expects to be discharged to:: Assisted living                 Home Equipment: Wheelchair - manual Additional Comments: Patient stating she can ambulate, patient questionable hisorian as chart stating mostly transfers to [facility]    Prior Function Prior Level of Function : Needs assist             Mobility Comments: Patient stating she can ambulate, patient questionable  hisorian as chart stating mostly transfers to [facility] ADLs Comments: assisted     Hand Dominance        Extremity/Trunk Assessment   Upper Extremity Assessment Upper Extremity Assessment: Generalized weakness    Lower Extremity Assessment Lower Extremity Assessment: Generalized weakness       Communication   Communication: No difficulties  Cognition Arousal/Alertness: Awake/alert Behavior During Therapy: WFL for tasks assessed/performed Overall Cognitive Status: No family/caregiver present to determine baseline cognitive functioning                                          General Comments      Exercises     Assessment/Plan    PT Assessment Patient needs continued PT services  PT Problem List Decreased strength;Decreased activity tolerance;Decreased balance;Decreased mobility;Cardiopulmonary status limiting activity       PT Treatment Interventions DME instruction;Balance training;Gait training;Neuromuscular re-education;Stair training;Functional mobility training;Patient/family education;Therapeutic activities;Therapeutic exercise;Manual techniques;Wheelchair mobility training    PT Goals (Current goals can be found in the Care Plan section)  Acute Rehab PT Goals Patient Stated Goal: return to ALF PT Goal Formulation: With patient Time For Goal Achievement: 10/08/22 Potential to Achieve Goals: Good    Frequency Min 3X/week     Co-evaluation               AM-PAC PT "6 Clicks" Mobility  Outcome Measure Help needed turning from your back to your side while in a flat bed without using bedrails?: A Little Help needed moving from lying on your back to sitting on the side of a flat bed without using bedrails?: A Little Help needed moving to and from a bed to a chair (including a wheelchair)?: A Lot Help needed standing up from a chair using your arms (e.g., wheelchair or bedside chair)?: A Lot Help needed to walk in hospital room?: A Lot Help  needed climbing 3-5 steps with a railing? : A Lot 6 Click Score: 14    End of Session   Activity Tolerance: Patient tolerated treatment well;Patient limited by fatigue Patient left: in bed;with call bell/phone within reach;with bed alarm set Nurse Communication: Mobility status PT Visit Diagnosis: Unsteadiness on feet (R26.81);Other abnormalities of gait and mobility (R26.89);Muscle weakness (generalized) (M62.81)    Time: 1610-9604 PT Time Calculation (min) (ACUTE ONLY): 21 min   Charges:   PT Evaluation $PT Eval Moderate Complexity: 1 Mod PT Treatments $Therapeutic Activity: 8-22 mins        12:59 PM, 09/24/22 Wyman Songster PT, DPT Physical Therapist at [facility]
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
## 2022-09-24 NOTE — Plan of Care (Signed)
  Problem: Acute Rehab PT Goals(only PT should resolve) Goal: Pt Will Go Supine/Side To Sit Outcome: Progressing Flowsheets (Taken 09/24/2022 1301) Pt will go Supine/Side to Sit: with supervision Goal: Pt Will Go Sit To Supine/Side Outcome: Progressing Flowsheets (Taken 09/24/2022 1301) Pt will go Sit to Supine/Side: with supervision Goal: Patient Will Transfer Sit To/From Stand Outcome: Progressing Flowsheets (Taken 09/24/2022 1301) Patient will transfer sit to/from stand:  with minimal assist  with min guard assist Goal: Pt Will Transfer Bed To Chair/Chair To Bed Outcome: Progressing Flowsheets (Taken 09/24/2022 1301) Pt will Transfer Bed to Chair/Chair to Bed:  min guard assist  with min assist Goal: Pt/caregiver will Perform Home Exercise Program Outcome: Progressing Flowsheets (Taken 09/24/2022 1301) Pt/caregiver will Perform Home Exercise Program:  For increased strengthening  For improved balance  With Supervision, verbal cues required/provided  1:01 PM, 09/24/22 Wyman Songster PT, DPT Physical Therapist at [facility]

## 2022-09-25 LAB — GLUCOSE, CAPILLARY
Glucose-Capillary: 135 mg/dL — ABNORMAL HIGH (ref 70–99)
Glucose-Capillary: 164 mg/dL — ABNORMAL HIGH (ref 70–99)
Glucose-Capillary: 173 mg/dL — ABNORMAL HIGH (ref 70–99)
Glucose-Capillary: 109 mg/dL — ABNORMAL HIGH (ref 70–99)

## 2022-09-25 LAB — CBC WITH DIFFERENTIAL/PLATELET
Abs Immature Granulocytes: 0.06 10*3/uL (ref 0.00–0.07)
Basophils Absolute: 0 10*3/uL (ref 0.0–0.1)
Basophils Relative: 1 %
Eosinophils Absolute: 0 10*3/uL (ref 0.0–0.5)
Eosinophils Relative: 1 %
HCT: 34.4 % — ABNORMAL LOW (ref 36.0–46.0)
Hemoglobin: 11 g/dL — ABNORMAL LOW (ref 12.0–15.0)
Immature Granulocytes: 1 %
Lymphocytes Relative: 20 %
Lymphs Abs: 1.7 10*3/uL (ref 0.7–4.0)
MCH: 31.6 pg (ref 26.0–34.0)
MCHC: 32 g/dL (ref 30.0–36.0)
MCV: 98.9 fL (ref 80.0–100.0)
Monocytes Absolute: 0.8 10*3/uL (ref 0.1–1.0)
Monocytes Relative: 9 %
Neutro Abs: 5.7 10*3/uL (ref 1.7–7.7)
Neutrophils Relative %: 68 %
Platelets: 356 10*3/uL (ref 150–400)
RBC: 3.48 MIL/uL — ABNORMAL LOW (ref 3.87–5.11)
RDW: 16.5 % — ABNORMAL HIGH (ref 11.5–15.5)
WBC: 8.3 10*3/uL (ref 4.0–10.5)
nRBC: 0 % (ref 0.0–0.2)

## 2022-09-25 LAB — COMPREHENSIVE METABOLIC PANEL
ALT: 40 U/L (ref 0–44)
AST: 78 U/L — ABNORMAL HIGH (ref 15–41)
Albumin: 2.9 g/dL — ABNORMAL LOW (ref 3.5–5.0)
Alkaline Phosphatase: 50 U/L (ref 38–126)
Anion gap: 11 (ref 5–15)
BUN: 29 mg/dL — ABNORMAL HIGH (ref 8–23)
CO2: 25 mmol/L (ref 22–32)
Calcium: 8.8 mg/dL — ABNORMAL LOW (ref 8.9–10.3)
Chloride: 105 mmol/L (ref 98–111)
Creatinine, Ser: 0.75 mg/dL (ref 0.44–1.00)
GFR, Estimated: >60 mL/min (ref >60)
Glucose, Bld: 160 mg/dL — ABNORMAL HIGH (ref 70–99)
Potassium: 3.8 mmol/L (ref 3.5–5.1)
Sodium: 141 mmol/L (ref 135–145)
Total Bilirubin: 0.5 mg/dL (ref 0.3–1.2)
Total Protein: 7 g/dL (ref 6.5–8.1)

## 2022-09-25 LAB — CULTURE, BLOOD (ROUTINE X 2)

## 2022-09-25 MED ORDER — DIGOXIN 125 MCG PO TABS
0.2500 mg | ORAL_TABLET | Freq: Every day | ORAL | Status: DC
  Administered 2022-09-26 – 2022-09-27 (×2): 0.25 mg via ORAL
  Filled 2022-09-25 (×2): qty 2

## 2022-09-25 MED ORDER — HYDROCOD POLI-CHLORPHE POLI ER 10-8 MG/5ML PO SUER
5.0000 mL | Freq: Two times a day (BID) | ORAL | Status: DC
  Administered 2022-09-25 – 2022-09-27 (×5): 5 mL via ORAL
  Filled 2022-09-25 (×5): qty 5

## 2022-09-25 MED ORDER — IPRATROPIUM-ALBUTEROL 0.5-2.5 (3) MG/3ML IN SOLN: 3.0000 mL | Freq: Four times a day (QID) | RESPIRATORY_TRACT | Status: DC | PRN

## 2022-09-25 MED ORDER — GUAIFENESIN-DM 100-10 MG/5ML PO SYRP
10.0000 mL | ORAL_SOLUTION | Freq: Three times a day (TID) | ORAL | Status: DC
  Administered 2022-09-25 – 2022-09-27 (×7): 10 mL via ORAL
  Filled 2022-09-25 (×9): qty 10

## 2022-09-25 MED ORDER — AZITHROMYCIN 250 MG PO TABS
500.0000 mg | ORAL_TABLET | Freq: Every day | ORAL | Status: AC
  Administered 2022-09-25 – 2022-09-26 (×2): 500 mg via ORAL
  Filled 2022-09-25 (×2): qty 2

## 2022-09-25 MED ORDER — PREDNISONE 20 MG PO TABS
40.0000 mg | ORAL_TABLET | Freq: Every day | ORAL | Status: DC
  Administered 2022-09-26: 40 mg via ORAL
  Filled 2022-09-25: qty 2

## 2022-09-25 MED ORDER — DIGOXIN 125 MCG PO TABS
0.2500 mg | ORAL_TABLET | Freq: Three times a day (TID) | ORAL | Status: AC
  Administered 2022-09-25 – 2022-09-26 (×3): 0.25 mg via ORAL
  Filled 2022-09-25 (×3): qty 2

## 2022-09-25 NOTE — Progress Notes (Addendum)
PROGRESS NOTE    Patient: [name]                            PCP: Galvin Proffer, MD                    DOB: [birth year]            DOA: 09/22/2022 ZOX:[id number]             DOS: 09/25/2022, 7:51 AM   LOS: 3 days   Date of Service: The patient was seen and examined on 09/25/2022  Subjective:   The patient was seen and examined this morning. Hemodynamically stable. Productive Cough... still coughing  HR  80 -126 .Marland Kitchen. 100 bpm this am  No issues overnight .  Brief Narrative:   ANJEANETTE PETZOLD is a 77 y.o. female with a history of COPD, hypertension, hyperlipidemia, hypothyroidism, CAD s/p stent placement, GERD, diabetes mellitus type 2, blindness, obesity. Patient presented secondary to fever and hypotension and was found to have evidence of pneumonia. Empiric antibiotic started.  Hospitalization complicated by development of atrial fibrillation with rapid ventricular rate.   Assessment and Plan:   Atypical pneumonia Patient with cough and infiltrates on imaging. Patient started empirically on Ceftriaxone and azithromycin. Blood cultures obtained. Sputum culture ordered. RVP panel ordered. -Continue Ceftriaxone and azithromycin. RVP negative -Follow-up blood cultures -Follow-up sputum culture -SLP evaluation   Acute respiratory failure with hypoxia Secondary to pneumonia.  -Wean to room air as able   Paroxysmal atrial fibrillation with RVR Newly diagnosed atrial fibrillation. Patient appears to have converted to atrial fibrillation on 4/25 at around 0930. Patient does report a history of palpitations. Last Transthoracic Echocardiogram from 2018 significant for normal LVEF. Home metoprolol succinate continued. Metoprolol IV 5 mg without adequate rate control effect. -Diltiazem bolus and infusion -Cardiology recommendations: Cardizem, discontinued metoprolol, starting digoxin -Eliquis 5 mg BID for stroke prophylaxis -Echocardiogram    Acute metabolic  encephalopathy Present on admission. Presumed secondary to infection. Resolved.   Diabetic neuropathy -Continue home gabapentin   Diabetes mellitus type 2 Uncontrolled with hyperglycemia. Patient is managed on Humalog sliding scale and Lantus 20 units BID as an outpatient. Current hemoglobin A1C of 10.1%. Patient started on Semglee 12 units BID and SSI on admission -Continue Semglee 12 units BID and SSI   Autoimmune hepatitis -Continue prednisone and Imuran   GERD -Continue Protonix   Primary hypertension Losartan held. Metoprolol discontinued by cardiology. Currently on diltiazem drip.   Hyperlipidemia -Continue Zetia   Hypothyroidism Patient is on Synthroid 112 mcg daily as an outpatient. TSH is low at 0.237, indicating need to decrease Synthroid dose. Likely contributing to RVR. -Decrease to Synthroid 100 mcg daily -Recheck TSH in 4-6 weeks as an outpatient   Blindness Stable.   Obesity Estimated body mass index is 35.49 kg/m as calculated from the following:   Height as of this encounter: 5\' 1"  (1.549 m).   Weight as of this encounter: 85.2 kg   ------------------------------------------------------------------------------------------------------------------------------------------------  DVT prophylaxis:  SCDs Start: 09/22/22 1830 apixaban (ELIQUIS) tablet 5 mg   Code Status:   Code Status: Full Code  Family Communication: No family member present at bedside- attempt will be made to update daily The above findings and plan of care has been discussed with patient (and family)  in detail,  they expressed understanding and agreement of above. -Advance care planning has been discussed.   Admission status:  Status is: Inpatient Remains inpatient appropriate because: Needs IV meds    Disposition: From  - home             Planning for discharge in 1-2 days: to   Procedures:   No admission procedures for hospital encounter.   Antimicrobials:   Anti-infectives (From admission, onward)    Start     Dose/Rate Route Frequency Ordered Stop   09/25/22 1000  azithromycin (ZITHROMAX) tablet 500 mg        500 mg Oral Daily 09/25/22 0748     09/22/22 1200  cefTRIAXone (ROCEPHIN) 2 g in sodium chloride 0.9 % 100 mL IVPB        2 g 200 mL/hr over 30 Minutes Intravenous Every 24 hours 09/22/22 1152 09/27/22 1159   09/22/22 1200  azithromycin (ZITHROMAX) 500 mg in sodium chloride 0.9 % 250 mL IVPB  Status:  Discontinued        500 mg 250 mL/hr over 60 Minutes Intravenous Every 24 hours 09/22/22 1152 09/25/22 0748        Medication:   amitriptyline  50 mg Oral QHS   apixaban  5 mg Oral BID   azaTHIOprine  150 mg Oral Daily   azithromycin  500 mg Oral Daily   Chlorhexidine Gluconate Cloth  6 each Topical Daily   Chlorhexidine Gluconate Cloth  6 each Topical Q0600   digoxin  0.25 mg Oral Q8H   ezetimibe  10 mg Oral Daily   insulin aspart  0-15 Units Subcutaneous TID WC   insulin aspart  0-5 Units Subcutaneous QHS   insulin glargine-yfgn  12 Units Subcutaneous BID   ipratropium-albuterol  3 mL Nebulization Q6H   levothyroxine  100 mcg Oral q AM   magnesium oxide  400 mg Oral Daily   melatonin  6 mg Oral QHS   metoprolol tartrate  25 mg Oral Q6H   mupirocin ointment  1 Application Nasal BID   predniSONE  10 mg Oral Daily   sertraline  25 mg Oral Daily    acetaminophen, albuterol, HYDROcodone bit-homatropine, ondansetron **OR** ondansetron (ZOFRAN) IV   Objective:   Vitals:   09/25/22 0443 09/25/22 0450 09/25/22 0500 09/25/22 0600  BP:   (!) 143/84 (!) 148/78  Pulse:   100 100  Resp:   20 20  Temp: 98.1 F (36.7 C) 97.9 F (36.6 C)    TempSrc: Oral Oral    SpO2:   96% 96%  Weight: 86 kg 86 kg    Height:        Intake/Output Summary (Last 24 hours) at 09/25/2022 0751 Last data filed at 09/25/2022 0700 Gross per 24 hour  Intake 1871.57 ml  Output 800 ml  Net 1071.57 ml   Filed Weights   09/23/22 1202 09/25/22  0443 09/25/22 0450  Weight: 85.2 kg 86 kg 86 kg     Physical examination:   Constitution:  Alert, cooperative, no distress,  Appears calm and comfortable  Psychiatric:   Normal and stable mood and affect, cognition intact,   HEENT:        Normocephalic, PERRL, otherwise with in Normal limits  Chest:         Chest symmetric Cardio vascular:   S1/S2, RRR, No murmure, No Rubs or Gallops  pulmonary: Clear to auscultation bilaterally, respirations unlabored, negative wheezes / crackles Abdomen: Soft, non-tender, non-distended, bowel sounds,no masses, no organomegaly Muscular skeletal: Limited exam - in bed, able to move all 4 extremities,   Neuro: CNII-XII intact. ,  normal motor and sensation, reflexes intact  Extremities: No pitting edema lower extremities, +2 pulses  Skin: Dry, warm to touch, negative for any Rashes, No open wounds Wounds: per nursing documentation   ------------------------------------------------------------------------------------------------------------------------------------------    LABs:     Latest Ref Rng & Units 09/23/2022    4:35 AM 09/22/2022   12:58 PM 04/14/2022    1:11 PM  CBC  WBC 4.0 - 10.5 K/uL 3.9  4.9  6.3   Hemoglobin 12.0 - 15.0 g/dL 62.1  9.9  30.8   Hematocrit 36.0 - 46.0 % 36.5  31.6  41.0   Platelets 150 - 400 K/uL 213  196  268       Latest Ref Rng & Units 09/23/2022    4:35 AM 09/22/2022   12:58 PM 04/14/2022    1:11 PM  CMP  Glucose 70 - 99 mg/dL 657  846    BUN 8 - 23 mg/dL 11  13    Creatinine 9.62 - 1.00 mg/dL 9.52  8.41    Sodium 324 - 145 mmol/L 137  135    Potassium 3.5 - 5.1 mmol/L 4.6  3.7    Chloride 98 - 111 mmol/L 100  99    CO2 22 - 32 mmol/L 23  27    Calcium 8.9 - 10.3 mg/dL 8.6  8.1    Total Protein 6.5 - 8.1 g/dL  6.5  7.7   Total Bilirubin 0.3 - 1.2 mg/dL  1.0  0.3   Alkaline Phos 38 - 126 U/L  43  88   AST 15 - 41 U/L  36  36   ALT 0 - 44 U/L  17  25        Micro Results Recent Results (from the  past 240 hour(s))  Blood Culture (routine x 2)     Status: None (Preliminary result)   Collection Time: 09/22/22 12:56 PM   Specimen: BLOOD  Result Value Ref Range Status   Specimen Description BLOOD RIGHT ARM  Final   Special Requests   Final    BOTTLES DRAWN AEROBIC AND ANAEROBIC Blood Culture adequate volume   Culture   Final    NO GROWTH 3 DAYS Performed at [facility] [address] [place], [place] [address]    Report Status PENDING  Incomplete  Resp panel by RT-PCR (RSV, Flu A&B, Covid) Anterior Nasal Swab     Status: None   Collection Time: 09/22/22 12:58 PM   Specimen: Anterior Nasal Swab  Result Value Ref Range Status   SARS Coronavirus 2 by RT PCR NEGATIVE NEGATIVE Final    Comment: (NOTE) SARS-CoV-2 target nucleic acids are NOT DETECTED.  The SARS-CoV-2 RNA is generally detectable in upper respiratory specimens during the acute phase of infection. The lowest concentration of SARS-CoV-2 viral copies this assay can detect is 138 copies/mL. A negative result does not preclude SARS-Cov-2 infection and should not be used as the sole basis for treatment or other patient management decisions. A negative result may occur with  improper specimen collection/handling, submission of specimen other than nasopharyngeal swab, presence of viral mutation(s) within the areas targeted by this assay, and inadequate number of viral copies(<138 copies/mL). A negative result must be combined with clinical observations, patient history, and epidemiological information. The expected result is Negative.  Fact Sheet for Patients:  BloggerCourse.com  Fact Sheet for Healthcare Providers:  SeriousBroker.it  This test is no t yet approved or cleared by the [place] FDA and  has been  authorized for detection and/or diagnosis of SARS-CoV-2 by FDA under an Emergency Use Authorization (EUA). This EUA will remain  in effect (meaning this test  can be used) for the duration of the COVID-19 declaration under Section 564(b)(1) of the Act, 21 U.S.C.section 360bbb-3(b)(1), unless the authorization is terminated  or revoked sooner.       Influenza A by PCR NEGATIVE NEGATIVE Final   Influenza B by PCR NEGATIVE NEGATIVE Final    Comment: (NOTE) The Xpert Xpress SARS-CoV-2/FLU/RSV plus assay is intended as an aid in the diagnosis of influenza from Nasopharyngeal swab specimens and should not be used as a sole basis for treatment. Nasal washings and aspirates are unacceptable for Xpert Xpress SARS-CoV-2/FLU/RSV testing.  Fact Sheet for Patients: BloggerCourse.com  Fact Sheet for Healthcare Providers: SeriousBroker.it  This test is not yet approved or cleared by the [place] FDA and has been authorized for detection and/or diagnosis of SARS-CoV-2 by FDA under an Emergency Use Authorization (EUA). This EUA will remain in effect (meaning this test can be used) for the duration of the COVID-19 declaration under Section 564(b)(1) of the Act, 21 U.S.C. section 360bbb-3(b)(1), unless the authorization is terminated or revoked.     Resp Syncytial Virus by PCR NEGATIVE NEGATIVE Final    Comment: (NOTE) Fact Sheet for Patients: BloggerCourse.com  Fact Sheet for Healthcare Providers: SeriousBroker.it  This test is not yet approved or cleared by the [place] FDA and has been authorized for detection and/or diagnosis of SARS-CoV-2 by FDA under an Emergency Use Authorization (EUA). This EUA will remain in effect (meaning this test can be used) for the duration of the COVID-19 declaration under Section 564(b)(1) of the Act, 21 U.S.C. section 360bbb-3(b)(1), unless the authorization is terminated or revoked.  Performed at [facility], [address] [place], [place] [address]   Blood Culture (routine x 2)     Status: None  (Preliminary result)   Collection Time: 09/22/22 12:58 PM   Specimen: BLOOD  Result Value Ref Range Status   Specimen Description BLOOD LEFT ARM  Final   Special Requests   Final    BOTTLES DRAWN AEROBIC AND ANAEROBIC Blood Culture adequate volume   Culture   Final    NO GROWTH 3 DAYS Performed at [facility], [address] [place], [place] [address]    Report Status PENDING  Incomplete  Urine Culture     Status: Abnormal   Collection Time: 09/22/22  6:15 PM   Specimen: Urine, Random  Result Value Ref Range Status   Specimen Description   Final    URINE, RANDOM Performed at [facility], [address] [place], [place] [address]    Special Requests   Final    NONE Reflexed from [id number] Performed at [facility], [address] [place], [place] [address]    Culture (A)  Final    <10,000 COLONIES/mL INSIGNIFICANT GROWTH Performed at [facility], [address], [place], [place] [address]    Report Status 09/23/2022 FINAL  Final  MRSA Next Gen by PCR, Nasal     Status: Abnormal   Collection Time: 09/23/22 12:09 PM   Specimen: Nasal Mucosa; Nasal Swab  Result Value Ref Range Status   MRSA by PCR Next Gen MRSA DETECTED (A) NOT DETECTED Final    Comment: CRITICAL RESULT CALLED TO, READ BACK BY AND VERIFIED WITH: FOLEY,B. @ 1626 ON 09/23/22 BY FRATTO,A.        The GeneXpert MRSA Assay (FDA approved for  NASAL specimens only), is one component of a comprehensive MRSA colonization surveillance program. It is not intended to diagnose MRSA infection nor to guide or monitor treatment for MRSA infections. Performed at [facility], [address], [place], [place] [address]   Respiratory (~20 pathogens) panel by PCR     Status: None   Collection Time: 09/23/22  2:50 PM   Specimen: Nasopharyngeal Swab; Respiratory  Result Value Ref Range Status   Adenovirus NOT DETECTED NOT DETECTED Final   Coronavirus 229E NOT DETECTED NOT DETECTED Final    Comment: (NOTE) The  Coronavirus on the Respiratory Panel, DOES NOT test for the novel  Coronavirus (2019 nCoV)    Coronavirus HKU1 NOT DETECTED NOT DETECTED Final   Coronavirus NL63 NOT DETECTED NOT DETECTED Final   Coronavirus OC43 NOT DETECTED NOT DETECTED Final   Metapneumovirus NOT DETECTED NOT DETECTED Final   Rhinovirus / Enterovirus NOT DETECTED NOT DETECTED Final   Influenza A NOT DETECTED NOT DETECTED Final   Influenza B NOT DETECTED NOT DETECTED Final   Parainfluenza Virus 1 NOT DETECTED NOT DETECTED Final   Parainfluenza Virus 2 NOT DETECTED NOT DETECTED Final   Parainfluenza Virus 3 NOT DETECTED NOT DETECTED Final   Parainfluenza Virus 4 NOT DETECTED NOT DETECTED Final   Respiratory Syncytial Virus NOT DETECTED NOT DETECTED Final   Bordetella pertussis NOT DETECTED NOT DETECTED Final   Bordetella Parapertussis NOT DETECTED NOT DETECTED Final   Chlamydophila pneumoniae NOT DETECTED NOT DETECTED Final   Mycoplasma pneumoniae NOT DETECTED NOT DETECTED Final    Comment: Performed at [facility], [address] [place], [place] [address]    Radiology Reports ECHOCARDIOGRAM COMPLETE  Result Date: 09/24/2022    ECHOCARDIOGRAM REPORT   Patient Name:   SUZETTA TIMKO Date of Exam: 09/24/2022 Medical Rec #:  [id number]       Height:       61.0 in Accession #:    [id number]      Weight:       187.8 lb Date of Birth:  03-08-1946       BSA:          1.839 m Patient Age:    76 years        BP:           140/81 mmHg Patient Gender: F               HR:           110 bpm. [facility] Procedure: 2D Echo, 3D Echo, Cardiac Doppler and Color Doppler Indications:    Atrial Fibrillation I48.91  History:        Patient has no prior history of Echocardiogram examinations.                 COPD, Arrythmias:Atrial Fibrillation, Signs/Symptoms:Shortness                 of Breath; Risk Factors:Hypertension, Diabetes and Former                 Smoker.  Sonographer:    Aron Baba Referring Phys: [phone] RALPH  A NETTEY  Sonographer Comments: Image acquisition challenging due to patient body habitus, Image acquisition challenging due to respiratory motion and Image acquisition challenging due to COPD. IMPRESSIONS  1. Left ventricular ejection fraction, by estimation, is 50 to 55%. The left ventricle has low normal function. Left ventricular endocardial border not optimally defined to evaluate regional wall motion.  Left ventricular diastolic function could not be evaluated.  2. Right ventricular systolic function was not well visualized. The right ventricular size is normal. There is normal pulmonary artery systolic pressure.  3. Left atrial size was moderately dilated.  4. The mitral valve is grossly normal. Mild mitral valve regurgitation. No evidence of mitral stenosis.  5. The aortic valve was not well visualized. Aortic valve regurgitation is not visualized. No aortic stenosis is present.  6. The inferior vena cava is normal in size with greater than 50% respiratory variability, suggesting right atrial pressure of 3 mmHg. Comparison(s): No prior Echocardiogram. FINDINGS  Left Ventricle: Left ventricular ejection fraction, by estimation, is 50 to 55%. The left ventricle has low normal function. Left ventricular endocardial border not optimally defined to evaluate regional wall motion. The left ventricular internal cavity  size was normal in size. There is no left ventricular hypertrophy. Left ventricular diastolic function could not be evaluated due to atrial fibrillation. Left ventricular diastolic function could not be evaluated. Right Ventricle: The right ventricular size is normal. Right vetricular wall thickness was not well visualized. Right ventricular systolic function was not well visualized. There is normal pulmonary artery systolic pressure. The tricuspid regurgitant velocity is 2.55 m/s, and with an assumed right atrial pressure of 3 mmHg, the estimated right ventricular systolic pressure is 29.0 mmHg. Left  Atrium: Left atrial size was moderately dilated. Right Atrium: Right atrial size was normal in size. Pericardium: There is no evidence of pericardial effusion. Mitral Valve: The mitral valve is grossly normal. Mild mitral valve regurgitation. No evidence of mitral valve stenosis. Tricuspid Valve: The tricuspid valve is not well visualized. Tricuspid valve regurgitation is mild . No evidence of tricuspid stenosis. Aortic Valve: The aortic valve was not well visualized. Aortic valve regurgitation is not visualized. No aortic stenosis is present. Pulmonic Valve: The pulmonic valve was not well visualized. Pulmonic valve regurgitation is not visualized. No evidence of pulmonic stenosis. Aorta: The aortic root is normal in size and structure. Venous: The inferior vena cava is normal in size with greater than 50% respiratory variability, suggesting right atrial pressure of 3 mmHg. IAS/Shunts: No atrial level shunt detected by color flow Doppler.  LEFT VENTRICLE PLAX 2D LVIDd:         4.60 cm   Diastology LVIDs:         3.30 cm   LV e' medial:    7.14 cm/s LV PW:         1.30 cm   LV E/e' medial:  16.8 LV IVS:        0.90 cm   LV e' lateral:   7.38 cm/s LVOT diam:     1.70 cm   LV E/e' lateral: 16.3 LV SV:         34 LV SV Index:   19 LVOT Area:     2.27 cm                           3D Volume EF:                          3D EF:        38 %                          LV EDV:       113 ml  LV ESV:       70 ml                          LV SV:        43 ml RIGHT VENTRICLE RV S prime:     8.03 cm/s TAPSE (M-mode): 1.4 cm LEFT ATRIUM             Index        RIGHT ATRIUM           Index LA diam:        4.50 cm 2.45 cm/m   RA Area:     17.90 cm LA Vol (A2C):   59.7 ml 32.46 ml/m  RA Volume:   43.80 ml  23.82 ml/m LA Vol (A4C):   79.0 ml 42.96 ml/m LA Biplane Vol: 74.5 ml 40.51 ml/m  AORTIC VALVE LVOT Vmax:   78.80 cm/s LVOT Vmean:  51.100 cm/s LVOT VTI:    0.151 m  AORTA Ao Root diam: 3.20 cm MITRAL  VALVE                TRICUSPID VALVE MV Area (PHT): 4.12 cm     TR Peak grad:   26.0 mmHg MV Decel Time: 184 msec     TR Vmax:        255.00 cm/s MV E velocity: 120.00 cm/s                             SHUNTS                             Systemic VTI:  0.15 m                             Systemic Diam: 1.70 cm Vishnu Priya Mallipeddi Electronically signed by Winfield Rast Mallipeddi Signature Date/Time: 09/24/2022/11:42:59 AM    Final     SIGNED: Kendell Bane, MD, FHM. FAAFP. [facility] - [facility] Time spent - 75 min. Of critical care time was spent in seeing, evaluating and examining the patient. Reviewing medical records, labs, drawn plan of care. [facility],  Pager (please use [email or web address] to page/ text) Please use Epic Secure Chat for non-urgent communication (7AM-7PM)  If 7PM-7AM, please contact night-coverage [email or web address], 09/25/2022, 7:51 AM

## 2022-09-25 NOTE — TOC Progression Note (Addendum)
Transition of Care [facility]) - Progression Note    Patient Details  Name: [name] MRN: [id number] Date of Birth: [birth year]  [facility]) CM/SW Contact  Catalina Gravel, [place] Phone Number: 09/25/2022, 3:40 PM  Clinical Narrative:     Pt not medically ready. Pt declined SNF and plan to return to ALF. Will need HH if return to ALF. CSW Checked with daughter who agreed / aware pt can decide no to SNF.  Expected Discharge Plan: Assisted Living Barriers to Discharge: Continued Medical Work up  Expected Discharge Plan and Services In-house Referral: Clinical Social Work     Living arrangements for the past 2 months: Assisted Living Facility                                       Social Determinants of Health (SDOH) Interventions SDOH Screenings   Food Insecurity: No Food Insecurity (09/24/2022)  Housing: Low Risk  (09/24/2022)  Transportation Needs: No Transportation Needs (09/24/2022)  Utilities: Not At Risk (09/24/2022)  Tobacco Use: Medium Risk (09/22/2022)    Readmission Risk Interventions    09/23/2022    2:48 PM  Readmission Risk Prevention Plan  Transportation Screening Complete  HRI or Home Care Consult Complete  Social Work Consult for Recovery Care Planning/Counseling Complete  Palliative Care Screening Not Applicable  Medication Review Oceanographer) Complete

## 2022-09-25 NOTE — Plan of Care (Signed)

## 2022-09-26 LAB — GLUCOSE, CAPILLARY
Glucose-Capillary: 139 mg/dL — ABNORMAL HIGH (ref 70–99)
Glucose-Capillary: 214 mg/dL — ABNORMAL HIGH (ref 70–99)
Glucose-Capillary: 204 mg/dL — ABNORMAL HIGH (ref 70–99)
Glucose-Capillary: 132 mg/dL — ABNORMAL HIGH (ref 70–99)

## 2022-09-26 LAB — CULTURE, BLOOD (ROUTINE X 2)
Culture: NO GROWTH
Culture: NO GROWTH

## 2022-09-26 MED ORDER — METOPROLOL TARTRATE 50 MG PO TABS
50.0000 mg | ORAL_TABLET | Freq: Two times a day (BID) | ORAL | Status: DC
  Administered 2022-09-26 – 2022-09-27 (×2): 50 mg via ORAL
  Filled 2022-09-26 (×2): qty 1

## 2022-09-26 MED ORDER — LEVOFLOXACIN IN D5W 500 MG/100ML IV SOLN
500.0000 mg | INTRAVENOUS | Status: DC
  Administered 2022-09-26: 500 mg via INTRAVENOUS
  Filled 2022-09-26: qty 100

## 2022-09-26 NOTE — Progress Notes (Signed)
PROGRESS NOTE    Patient: [name]                            PCP: Galvin Proffer, MD                    DOB: May 06, 1946            DOA: 09/22/2022 WUJ:[id number]             DOS: 09/26/2022, 10:28 AM   LOS: 4 days   Date of Service: The patient was seen and examined on 09/26/2022  Subjective:   The patient was seen and examined this morning, stable no acute distress, still complaining of a hacking cough nonproductive this morning Heart rate 76-90, currently 90, blood pressure 161/77 Satting 96% on room air   Brief Narrative:   [name] is a 77 y.o. female with a history of COPD, hypertension, hyperlipidemia, hypothyroidism, CAD s/p stent placement, GERD, diabetes mellitus type 2, blindness, obesity. Patient presented secondary to fever and hypotension and was found to have evidence of pneumonia. Empiric antibiotic started.  Hospitalization complicated by development of atrial fibrillation with rapid ventricular rate.   Assessment and Plan:   Atypical pneumonia Patient with cough and infiltrates on imaging. Patient started empirically on Ceftriaxone and azithromycin. Blood cultures obtained. Sputum culture ordered. RVP panel ordered. -Continue Ceftriaxone and azithromycin. RVP negative -Follow-up blood cultures -Follow-up sputum culture -SLP evaluation   Acute respiratory failure with hypoxia Secondary to pneumonia.  -Wean to room air as able   Paroxysmal atrial fibrillation with RVR Newly diagnosed atrial fibrillation. Patient appears to have converted to atrial fibrillation on 4/25 at around 0930. Patient does report a history of palpitations. Last Transthoracic Echocardiogram from 2018 significant for normal LVEF. Home metoprolol succinate -Diltiazem bolus and infusion -Cardiology recommendations: D/Cing Cardizem gtt  10/24/22 Start IV digoxin loading with IV digoxin 0.5 mg followed by 0.25 mg every 6 hours. Start metoprolol tartrate 25 mg every 6 hours and stop  metoprolol succinate.    -Eliquis 5 mg BID for stroke prophylaxis -Echocardiogram: : Left ventricular ejection fraction, by estimation, is 50 to 55%. The left ventricle has low normal function. Left atrial size was moderately dilated.  The mitral valve is grossly normal. Mild mitral valve regurgitation.    Acute metabolic encephalopathy Present on admission. Presumed secondary to infection.  Resolved.   Diabetic neuropathy -Continue home gabapentin   Diabetes mellitus type 2 Uncontrolled with hyperglycemia. Patient is managed on Humalog sliding scale and Lantus 20 units BID as an outpatient. Current hemoglobin A1C of 10.1%.  Patient started on Semglee 12 units BID and SSI on admission -will increase Semglee to 15 units BID and SSI   Autoimmune hepatitis -Continue prednisone and Imuran   GERD -Continue Protonix   Primary hypertension Losartan held. Metoprolol discontinued by cardiology.  Will take off Diltiazem drip.   Hyperlipidemia -Continue Zetia   Hypothyroidism Patient is on Synthroid 112 mcg daily as an outpatient. TSH is low at 0.237, indicating need to decrease Synthroid dose. Likely contributing to RVR. -Decrease to Synthroid 100 mcg daily -Recheck TSH in 4-6 weeks as an outpatient   Blindness Stable.   Obesity Estimated body mass index is 35.49 kg/m as calculated from the following:   Height as of this encounter: 5\' 1"  (1.549 m).   Weight as of this encounter: 85.2 kg   Stable today transferring out of ICU ------------------------------------------------------------------------------------------------------------------------------------------------  DVT prophylaxis:  SCDs Start: 09/22/22 1830 apixaban (ELIQUIS) tablet 5 mg   Code Status:   Code Status: Full Code  Family Communication: No family member present at bedside- attempt will be made to update daily The above findings and plan of care has been discussed with patient (and family)  in detail,   they expressed understanding and agreement of above. -Advance care planning has been discussed.   Admission status:   Status is: Inpatient Remains inpatient appropriate because: Needs IV meds    Disposition: From  -assisted living            Planning for discharge assisted living and 1-2 days  Procedures:   No admission procedures for hospital encounter.   Antimicrobials:  Anti-infectives (From admission, onward)    Start     Dose/Rate Route Frequency Ordered Stop   09/25/22 1000  azithromycin (ZITHROMAX) tablet 500 mg        500 mg Oral Daily 09/25/22 0748 09/26/22 0810   09/22/22 1200  cefTRIAXone (ROCEPHIN) 2 g in sodium chloride 0.9 % 100 mL IVPB        2 g 200 mL/hr over 30 Minutes Intravenous Every 24 hours 09/22/22 1152 09/27/22 1159   09/22/22 1200  azithromycin (ZITHROMAX) 500 mg in sodium chloride 0.9 % 250 mL IVPB  Status:  Discontinued        500 mg 250 mL/hr over 60 Minutes Intravenous Every 24 hours 09/22/22 1152 09/25/22 0748        Medication:   amitriptyline  50 mg Oral QHS   apixaban  5 mg Oral BID   azaTHIOprine  150 mg Oral Daily   Chlorhexidine Gluconate Cloth  6 each Topical Daily   Chlorhexidine Gluconate Cloth  6 each Topical Q0600   chlorpheniramine-HYDROcodone  5 mL Oral Q12H   digoxin  0.25 mg Oral Daily   ezetimibe  10 mg Oral Daily   guaiFENesin-dextromethorphan  10 mL Oral Q8H   insulin aspart  0-15 Units Subcutaneous TID WC   insulin aspart  0-5 Units Subcutaneous QHS   insulin glargine-yfgn  12 Units Subcutaneous BID   levothyroxine  100 mcg Oral q AM   magnesium oxide  400 mg Oral Daily   melatonin  6 mg Oral QHS   metoprolol tartrate  50 mg Oral BID   mupirocin ointment  1 Application Nasal BID   predniSONE  40 mg Oral Daily   sertraline  25 mg Oral Daily    acetaminophen, albuterol, HYDROcodone bit-homatropine, ipratropium-albuterol, ondansetron **OR** ondansetron (ZOFRAN) IV   Objective:   Vitals:   09/26/22 0602  09/26/22 0700 09/26/22 0741 09/26/22 0810  BP:  (!) 161/77    Pulse: 93 76  90  Resp:  20    Temp:  98.1 F (36.7 C) 98.1 F (36.7 C)   TempSrc:  Oral Oral   SpO2:  96%    Weight:      Height:        Intake/Output Summary (Last 24 hours) at 09/26/2022 1028 Last data filed at 09/26/2022 0600 Gross per 24 hour  Intake 1196.74 ml  Output 1300 ml  Net -103.26 ml   Filed Weights   09/25/22 0443 09/25/22 0450 09/26/22 0516  Weight: 86 kg 86 kg 81.8 kg     Physical examination:        General:  AAO x 3,  cooperative, no distress; nonproductive hacking cough  HEENT:  Blind-normocephalic, PERRL, otherwise with in Normal limits   Neuro:  CNII-XII intact. , normal motor and sensation, reflexes intact   Lungs:   Clear to auscultation BL, Respirations unlabored,  No wheezes / crackles--diffuse Rales  Cardio:    S1/S2, RRR, No murmure, No Rubs or Gallops   Abdomen:  Soft, non-tender, bowel sounds active all four quadrants, no guarding or peritoneal signs.  Muscular  skeletal:  Limited exam -global generalized weaknesses - in bed, able to move all 4 extremities,   2+ pulses,  symmetric, No pitting edema  Skin:  Dry, warm to touch, negative for any Rashes,  Wounds: Please see nursing documentation         ------------------------------------------------------------------------------------------------------------------------------------------    LABs:     Latest Ref Rng & Units 09/25/2022    8:46 AM 09/23/2022    4:35 AM 09/22/2022   12:58 PM  CBC  WBC 4.0 - 10.5 K/uL 8.3  3.9  4.9   Hemoglobin 12.0 - 15.0 g/dL 16.1  09.6  9.9   Hematocrit 36.0 - 46.0 % 34.4  36.5  31.6   Platelets 150 - 400 K/uL 356  213  196       Latest Ref Rng & Units 09/25/2022    8:46 AM 09/23/2022    4:35 AM 09/22/2022   12:58 PM  CMP  Glucose 70 - 99 mg/dL 045  409  811   BUN 8 - 23 mg/dL 29  11  13    Creatinine 0.44 - 1.00 mg/dL 9.14  7.82  9.56   Sodium 135 - 145 mmol/L 141  137  135    Potassium 3.5 - 5.1 mmol/L 3.8  4.6  3.7   Chloride 98 - 111 mmol/L 105  100  99   CO2 22 - 32 mmol/L 25  23  27    Calcium 8.9 - 10.3 mg/dL 8.8  8.6  8.1   Total Protein 6.5 - 8.1 g/dL 7.0   6.5   Total Bilirubin 0.3 - 1.2 mg/dL 0.5   1.0   Alkaline Phos 38 - 126 U/L 50   43   AST 15 - 41 U/L 78   36   ALT 0 - 44 U/L 40   17        Micro Results Recent Results (from the past 240 hour(s))  Blood Culture (routine x 2)     Status: None (Preliminary result)   Collection Time: 09/22/22 12:56 PM   Specimen: BLOOD  Result Value Ref Range Status   Specimen Description BLOOD RIGHT ARM  Final   Special Requests   Final    BOTTLES DRAWN AEROBIC AND ANAEROBIC Blood Culture adequate volume   Culture   Final    NO GROWTH 4 DAYS Performed at [facility], [address] [place], [place] [address]    Report Status PENDING  Incomplete  Resp panel by RT-PCR (RSV, Flu A&B, Covid) Anterior Nasal Swab     Status: None   Collection Time: 09/22/22 12:58 PM   Specimen: Anterior Nasal Swab  Result Value Ref Range Status   SARS Coronavirus 2 by RT PCR NEGATIVE NEGATIVE Final    Comment: (NOTE) SARS-CoV-2 target nucleic acids are NOT DETECTED.  The SARS-CoV-2 RNA is generally detectable in upper respiratory specimens during the acute phase of infection. The lowest concentration of SARS-CoV-2 viral copies this assay can detect is 138 copies/mL. A negative result does not preclude SARS-Cov-2 infection and should not be used as the sole basis for treatment or other patient management decisions. A negative result may occur  with  improper specimen collection/handling, submission of specimen other than nasopharyngeal swab, presence of viral mutation(s) within the areas targeted by this assay, and inadequate number of viral copies(<138 copies/mL). A negative result must be combined with clinical observations, patient history, and epidemiological information. The expected result is Negative.  Fact  Sheet for Patients:  BloggerCourse.com  Fact Sheet for Healthcare Providers:  SeriousBroker.it  This test is no t yet approved or cleared by the [place] FDA and  has been authorized for detection and/or diagnosis of SARS-CoV-2 by FDA under an Emergency Use Authorization (EUA). This EUA will remain  in effect (meaning this test can be used) for the duration of the COVID-19 declaration under Section 564(b)(1) of the Act, 21 U.S.C.section 360bbb-3(b)(1), unless the authorization is terminated  or revoked sooner.       Influenza A by PCR NEGATIVE NEGATIVE Final   Influenza B by PCR NEGATIVE NEGATIVE Final    Comment: (NOTE) The Xpert Xpress SARS-CoV-2/FLU/RSV plus assay is intended as an aid in the diagnosis of influenza from Nasopharyngeal swab specimens and should not be used as a sole basis for treatment. Nasal washings and aspirates are unacceptable for Xpert Xpress SARS-CoV-2/FLU/RSV testing.  Fact Sheet for Patients: BloggerCourse.com  Fact Sheet for Healthcare Providers: SeriousBroker.it  This test is not yet approved or cleared by the [place] FDA and has been authorized for detection and/or diagnosis of SARS-CoV-2 by FDA under an Emergency Use Authorization (EUA). This EUA will remain in effect (meaning this test can be used) for the duration of the COVID-19 declaration under Section 564(b)(1) of the Act, 21 U.S.C. section 360bbb-3(b)(1), unless the authorization is terminated or revoked.     Resp Syncytial Virus by PCR NEGATIVE NEGATIVE Final    Comment: (NOTE) Fact Sheet for Patients: BloggerCourse.com  Fact Sheet for Healthcare Providers: SeriousBroker.it  This test is not yet approved or cleared by the [place] FDA and has been authorized for detection and/or diagnosis of SARS-CoV-2 by FDA under an  Emergency Use Authorization (EUA). This EUA will remain in effect (meaning this test can be used) for the duration of the COVID-19 declaration under Section 564(b)(1) of the Act, 21 U.S.C. section 360bbb-3(b)(1), unless the authorization is terminated or revoked.  Performed at [facility], [address], [place], [place] [address]   Blood Culture (routine x 2)     Status: None (Preliminary result)   Collection Time: 09/22/22 12:58 PM   Specimen: BLOOD  Result Value Ref Range Status   Specimen Description BLOOD LEFT ARM  Final   Special Requests   Final    BOTTLES DRAWN AEROBIC AND ANAEROBIC Blood Culture adequate volume   Culture   Final    NO GROWTH 4 DAYS Performed at [facility], [address] [place], [place] [address]    Report Status PENDING  Incomplete  Urine Culture     Status: Abnormal   Collection Time: 09/22/22  6:15 PM   Specimen: Urine, Random  Result Value Ref Range Status   Specimen Description   Final    URINE, RANDOM Performed at [facility], [address] [place], [place] [address]    Special Requests   Final    NONE Reflexed from [id number] Performed at [facility], [address], [place], [place] [address]    Culture (A)  Final    <10,000 COLONIES/mL INSIGNIFICANT GROWTH Performed at [facility], [address], [place], [place] [address]    Report Status 09/23/2022 FINAL  Final  MRSA Next Gen by PCR, Nasal     Status: Abnormal   Collection Time: 09/23/22 12:09 PM   Specimen: Nasal Mucosa; Nasal Swab  Result Value Ref Range Status   MRSA by PCR Next Gen MRSA DETECTED (A) NOT DETECTED Final    Comment: CRITICAL RESULT CALLED TO, READ BACK BY AND VERIFIED WITH: FOLEY,B. @ 1626 ON 09/23/22 BY FRATTO,A.        The GeneXpert MRSA Assay (FDA approved for NASAL specimens only), is one component of a comprehensive MRSA colonization surveillance program. It is not intended to diagnose MRSA infection nor to guide or monitor treatment for MRSA  infections. Performed at [facility], [address] [place], [place] [address]   Respiratory (~20 pathogens) panel by PCR     Status: None   Collection Time: 09/23/22  2:50 PM   Specimen: Nasopharyngeal Swab; Respiratory  Result Value Ref Range Status   Adenovirus NOT DETECTED NOT DETECTED Final   Coronavirus 229E NOT DETECTED NOT DETECTED Final    Comment: (NOTE) The Coronavirus on the Respiratory Panel, DOES NOT test for the novel  Coronavirus (2019 nCoV)    Coronavirus HKU1 NOT DETECTED NOT DETECTED Final   Coronavirus NL63 NOT DETECTED NOT DETECTED Final   Coronavirus OC43 NOT DETECTED NOT DETECTED Final   Metapneumovirus NOT DETECTED NOT DETECTED Final   Rhinovirus / Enterovirus NOT DETECTED NOT DETECTED Final   Influenza A NOT DETECTED NOT DETECTED Final   Influenza B NOT DETECTED NOT DETECTED Final   Parainfluenza Virus 1 NOT DETECTED NOT DETECTED Final   Parainfluenza Virus 2 NOT DETECTED NOT DETECTED Final   Parainfluenza Virus 3 NOT DETECTED NOT DETECTED Final   Parainfluenza Virus 4 NOT DETECTED NOT DETECTED Final   Respiratory Syncytial Virus NOT DETECTED NOT DETECTED Final   Bordetella pertussis NOT DETECTED NOT DETECTED Final   Bordetella Parapertussis NOT DETECTED NOT DETECTED Final   Chlamydophila pneumoniae NOT DETECTED NOT DETECTED Final   Mycoplasma pneumoniae NOT DETECTED NOT DETECTED Final    Comment: Performed at [facility], [address], [place], [place] [address]    Radiology Reports No results found.  SIGNED: Kendell Bane, MD, FHM. FAAFP. [facility] - [facility] hospitalist Time spent - 75 min. Of critical care time was spent in seeing, evaluating and examining the patient. Reviewing medical records, labs, drawn plan of care. [facility],  Pager (please use [email or web address] to page/ text) Please use Epic Secure Chat for non-urgent communication (7AM-7PM)  If 7PM-7AM, please contact night-coverage [email or web address], 09/26/2022, 10:28  AM

## 2022-09-27 DIAGNOSIS — I4891 Unspecified atrial fibrillation: Secondary | ICD-10-CM | POA: Diagnosis not present

## 2022-09-27 LAB — CBC
HCT: 38.6 % (ref 36.0–46.0)
Hemoglobin: 12.5 g/dL (ref 12.0–15.0)
MCH: 31.8 pg (ref 26.0–34.0)
MCHC: 32.4 g/dL (ref 30.0–36.0)
MCV: 98.2 fL (ref 80.0–100.0)
Platelets: 404 10*3/uL — ABNORMAL HIGH (ref 150–400)
RBC: 3.93 MIL/uL (ref 3.87–5.11)
RDW: 15.4 % (ref 11.5–15.5)
WBC: 7.5 10*3/uL (ref 4.0–10.5)
nRBC: 0 % (ref 0.0–0.2)

## 2022-09-27 LAB — BASIC METABOLIC PANEL
Anion gap: 12 (ref 5–15)
BUN: 22 mg/dL (ref 8–23)
CO2: 28 mmol/L (ref 22–32)
Calcium: 9 mg/dL (ref 8.9–10.3)
Chloride: 101 mmol/L (ref 98–111)
Creatinine, Ser: 0.84 mg/dL (ref 0.44–1.00)
GFR, Estimated: >60 mL/min (ref >60)
Glucose, Bld: 106 mg/dL — ABNORMAL HIGH (ref 70–99)
Potassium: 3.9 mmol/L (ref 3.5–5.1)
Sodium: 141 mmol/L (ref 135–145)

## 2022-09-27 LAB — GLUCOSE, CAPILLARY
Glucose-Capillary: 106 mg/dL — ABNORMAL HIGH (ref 70–99)
Glucose-Capillary: 156 mg/dL — ABNORMAL HIGH (ref 70–99)
Glucose-Capillary: 266 mg/dL — ABNORMAL HIGH (ref 70–99)

## 2022-09-27 LAB — CULTURE, BLOOD (ROUTINE X 2): Special Requests: ADEQUATE

## 2022-09-27 MED ORDER — PREDNISONE 10 MG PO TABS: 10.0000 mg | ORAL_TABLET | Freq: Every day | ORAL | 0 refills | Status: AC

## 2022-09-27 MED ORDER — LEVOFLOXACIN 500 MG PO TABS: 500.0000 mg | ORAL_TABLET | Freq: Every day | ORAL | 0 refills | Status: DC

## 2022-09-27 MED ORDER — METOPROLOL TARTRATE 50 MG PO TABS: 50.0000 mg | ORAL_TABLET | Freq: Two times a day (BID) | ORAL | 1 refills | Status: DC

## 2022-09-27 MED ORDER — BENZONATATE 100 MG PO CAPS: 100.0000 mg | ORAL_CAPSULE | Freq: Three times a day (TID) | ORAL | 0 refills | Status: DC | PRN

## 2022-09-27 MED ORDER — LEVALBUTEROL HCL 1.25 MG/0.5ML IN NEBU
1.2500 mg | INHALATION_SOLUTION | Freq: Three times a day (TID) | RESPIRATORY_TRACT | Status: DC
  Filled 2022-09-27: qty 0.5

## 2022-09-27 MED ORDER — LEVOTHYROXINE SODIUM 100 MCG PO TABS: 100.0000 ug | ORAL_TABLET | Freq: Every morning | ORAL | 1 refills | Status: DC

## 2022-09-27 MED ORDER — GUAIFENESIN-DM 100-10 MG/5ML PO SYRP: 10.0000 mL | ORAL_SOLUTION | Freq: Three times a day (TID) | ORAL | 0 refills | Status: DC

## 2022-09-27 MED ORDER — LEVOFLOXACIN 500 MG PO TABS
500.0000 mg | ORAL_TABLET | Freq: Every day | ORAL | Status: DC
  Administered 2022-09-27: 500 mg via ORAL
  Filled 2022-09-27: qty 1

## 2022-09-27 MED ORDER — APIXABAN 5 MG PO TABS: 5.0000 mg | ORAL_TABLET | Freq: Two times a day (BID) | ORAL | 1 refills | Status: DC

## 2022-09-27 MED ORDER — PREDNISONE 20 MG PO TABS
20.0000 mg | ORAL_TABLET | Freq: Every day | ORAL | Status: DC
  Administered 2022-09-27: 20 mg via ORAL
  Filled 2022-09-27: qty 1

## 2022-09-27 MED ORDER — LEVOFLOXACIN 500 MG PO TABS: 500.0000 mg | ORAL_TABLET | Freq: Every day | ORAL | 0 refills | Status: AC

## 2022-09-27 MED ORDER — MUPIROCIN 2 % EX OINT: 1.0000 | TOPICAL_OINTMENT | Freq: Two times a day (BID) | CUTANEOUS | 0 refills | Status: AC

## 2022-09-27 NOTE — Plan of Care (Signed)
Problem: Education: Goal: Knowledge of General Education information will improve Description: Including pain rating scale, medication(s)/side effects and non-pharmacologic comfort measures 09/27/2022 1130 by Sheela Stack, RN Outcome: Adequate for Discharge 09/27/2022 0830 by Sheela Stack, RN Outcome: Progressing   Problem: Health Behavior/Discharge Planning: Goal: Ability to manage health-related needs will improve 09/27/2022 1130 by Sheela Stack, RN Outcome: Adequate for Discharge 09/27/2022 0830 by Sheela Stack, RN Outcome: Progressing   Problem: Clinical Measurements: Goal: Ability to maintain clinical measurements within normal limits will improve 09/27/2022 1130 by Sheela Stack, RN Outcome: Adequate for Discharge 09/27/2022 0830 by Sheela Stack, RN Outcome: Progressing Goal: Will remain free from infection 09/27/2022 1130 by Sheela Stack, RN Outcome: Adequate for Discharge 09/27/2022 0830 by Sheela Stack, RN Outcome: Progressing Goal: Diagnostic test results will improve 09/27/2022 1130 by Sheela Stack, RN Outcome: Adequate for Discharge 09/27/2022 0830 by Sheela Stack, RN Outcome: Progressing Goal: Respiratory complications will improve 09/27/2022 1130 by Sheela Stack, RN Outcome: Adequate for Discharge 09/27/2022 0830 by Sheela Stack, RN Outcome: Progressing Goal: Cardiovascular complication will be avoided 09/27/2022 1130 by Sheela Stack, RN Outcome: Adequate for Discharge 09/27/2022 0830 by Sheela Stack, RN Outcome: Progressing   Problem: Activity: Goal: Risk for activity intolerance will decrease 09/27/2022 1130 by Sheela Stack, RN Outcome: Adequate for Discharge 09/27/2022 0830 by Sheela Stack, RN Outcome: Progressing   Problem: Nutrition: Goal: Adequate nutrition will be maintained 09/27/2022 1130 by Sheela Stack, RN Outcome: Adequate for Discharge 09/27/2022 0830 by Sheela Stack, RN Outcome: Progressing    Problem: Coping: Goal: Level of anxiety will decrease 09/27/2022 1130 by Sheela Stack, RN Outcome: Adequate for Discharge 09/27/2022 0830 by Sheela Stack, RN Outcome: Progressing   Problem: Elimination: Goal: Will not experience complications related to bowel motility 09/27/2022 1130 by Sheela Stack, RN Outcome: Adequate for Discharge 09/27/2022 0830 by Sheela Stack, RN Outcome: Progressing Goal: Will not experience complications related to urinary retention 09/27/2022 1130 by Sheela Stack, RN Outcome: Adequate for Discharge 09/27/2022 0830 by Sheela Stack, RN Outcome: Progressing   Problem: Pain Managment: Goal: General experience of comfort will improve 09/27/2022 1130 by Sheela Stack, RN Outcome: Adequate for Discharge 09/27/2022 0830 by Sheela Stack, RN Outcome: Progressing   Problem: Safety: Goal: Ability to remain free from injury will improve 09/27/2022 1130 by Sheela Stack, RN Outcome: Adequate for Discharge 09/27/2022 0830 by Sheela Stack, RN Outcome: Progressing   Problem: Skin Integrity: Goal: Risk for impaired skin integrity will decrease 09/27/2022 1130 by Sheela Stack, RN Outcome: Adequate for Discharge 09/27/2022 0830 by Sheela Stack, RN Outcome: Progressing   Problem: Activity: Goal: Ability to tolerate increased activity will improve 09/27/2022 1130 by Sheela Stack, RN Outcome: Adequate for Discharge 09/27/2022 0830 by Sheela Stack, RN Outcome: Progressing   Problem: Clinical Measurements: Goal: Ability to maintain a body temperature in the normal range will improve 09/27/2022 1130 by Sheela Stack, RN Outcome: Adequate for Discharge 09/27/2022 0830 by Sheela Stack, RN Outcome: Progressing   Problem: Respiratory: Goal: Ability to maintain adequate ventilation will improve 09/27/2022 1130 by Sheela Stack, RN Outcome: Adequate for Discharge 09/27/2022 0830 by Sheela Stack, RN Outcome: Progressing Goal:  Ability to maintain a clear airway will improve 09/27/2022 1130 by Sheela Stack, RN Outcome: Adequate for Discharge 09/27/2022 0830 by Sheela Stack, RN Outcome: Progressing  Problem: Education: Goal: Ability to describe self-care measures that may prevent or decrease complications (Diabetes Survival Skills Education) will improve 09/27/2022 1130 by Sheela Stack, RN Outcome: Adequate for Discharge 09/27/2022 0830 by Sheela Stack, RN Outcome: Progressing Goal: Individualized Educational Video(s) 09/27/2022 1130 by Sheela Stack, RN Outcome: Adequate for Discharge 09/27/2022 0830 by Sheela Stack, RN Outcome: Progressing   Problem: Coping: Goal: Ability to adjust to condition or change in health will improve 09/27/2022 1130 by Sheela Stack, RN Outcome: Adequate for Discharge 09/27/2022 0830 by Sheela Stack, RN Outcome: Progressing   Problem: Fluid Volume: Goal: Ability to maintain a balanced intake and output will improve 09/27/2022 1130 by Sheela Stack, RN Outcome: Adequate for Discharge 09/27/2022 0830 by Sheela Stack, RN Outcome: Progressing   Problem: Health Behavior/Discharge Planning: Goal: Ability to identify and utilize available resources and services will improve 09/27/2022 1130 by Sheela Stack, RN Outcome: Adequate for Discharge 09/27/2022 0830 by Sheela Stack, RN Outcome: Progressing Goal: Ability to manage health-related needs will improve 09/27/2022 1130 by Sheela Stack, RN Outcome: Adequate for Discharge 09/27/2022 0830 by Sheela Stack, RN Outcome: Progressing   Problem: Metabolic: Goal: Ability to maintain appropriate glucose levels will improve 09/27/2022 1130 by Sheela Stack, RN Outcome: Adequate for Discharge 09/27/2022 0830 by Sheela Stack, RN Outcome: Progressing   Problem: Nutritional: Goal: Maintenance of adequate nutrition will improve 09/27/2022 1130 by Sheela Stack, RN Outcome: Adequate for  Discharge 09/27/2022 0830 by Sheela Stack, RN Outcome: Progressing Goal: Progress toward achieving an optimal weight will improve 09/27/2022 1130 by Sheela Stack, RN Outcome: Adequate for Discharge 09/27/2022 0830 by Sheela Stack, RN Outcome: Progressing   Problem: Skin Integrity: Goal: Risk for impaired skin integrity will decrease 09/27/2022 1130 by Sheela Stack, RN Outcome: Adequate for Discharge 09/27/2022 0830 by Sheela Stack, RN Outcome: Progressing   Problem: Tissue Perfusion: Goal: Adequacy of tissue perfusion will improve 09/27/2022 1130 by Sheela Stack, RN Outcome: Adequate for Discharge 09/27/2022 0830 by Sheela Stack, RN Outcome: Progressing

## 2022-09-27 NOTE — Consult Note (Signed)
[facility] Customer service manager [facility]) Accountable Care Organization (ACO) [facility] Liaison Note  09/27/2022  [name] 04/01/1946 [id number]  Location: [facility] RN Hospital Liaison screened the patient remotely at [facility].  Insurance: [employer]   TAELYN BROECKER is a 77 y.o. female who is a Primary Care Patient of [place], Myrene Galas, MD. The patient was screened for readmission hospitalization with noted medium risk score for unplanned readmission risk with 1 IP in 6 months.  The patient was assessed for potential [facility]) Care Management service needs for post hospital transition for care coordination. Review of patient's electronic medical record reveals patient admitted with pneumonia and will discharge back to her ALF [facility] today with provided transportation. [facility] PAC-RN does not follow ALFs.  [facility] Care Management/Population Health does not replace or interfere with any arrangements made by the Inpatient Transition of Care team.   For questions contact:   Elliot Cousin, RN, BSN [facility] Liaison    [facility]  Population Health Office Hours MTWF 8:00 am to 6 pm off on Thursday [phone] mobile [phone] [Office toll free line][facility] Office Hours are M-F 8:30 - 5 pm 24 hour nurse advise line [phone] Conceirge  [email or web address]

## 2022-09-27 NOTE — Plan of Care (Signed)

## 2022-09-27 NOTE — Care Management Important Message (Signed)
Important Message  Patient Details  Name: [name] MRN: [id number] Date of Birth: 1945/07/10   Medicare Important Message Given:  Yes     Corey Harold 09/27/2022, 11:21 AM

## 2022-09-27 NOTE — [place] FL2 (Signed)
[place] MEDICAID FL2 LEVEL OF CARE FORM     IDENTIFICATION  Patient Name: [name] Birthdate: [birth year] Sex: female Admission Date (Current Location): 09/22/2022  [facility] [place] [facility] [place] Number:  [employer] and Address:  [facility],  [address], [place] [address]      Provider Number: [id number]  Attending Physician Name and Address:  Kendell Bane, MD  Relative Name and Phone Number:       Current Level of Care: Hospital Recommended Level of Care:   Prior Approval Number:    Date Approved/Denied:   PASRR Number: [id number]  Discharge Plan:      Current Diagnoses: Patient Active Problem List   Diagnosis Date Noted   Paroxysmal atrial fibrillation with RVR (HCC) 09/23/2022   Poisoning by unspecified narcotics, intentional self-harm, initial encounter (HCC) 09/22/2022   Pneumonia 09/22/2022   Loose stools 09/14/2022   Bimalleolar fracture of right ankle 06/29/2021   Hypokalemia 06/29/2021   Hyperglycemia due to diabetes mellitus (HCC) 06/29/2021   Insomnia 06/29/2021   Diabetic neuropathy (HCC) 06/29/2021   COVID-19 virus infection 06/29/2021   Autoimmune hepatitis (HCC) 01/20/2021   Elevated LFTs 12/11/2020   NASH (nonalcoholic steatohepatitis) 12/11/2020   Acute lower UTI 06/18/2018   Elevated troponin 06/18/2018   UTI (urinary tract infection) 06/18/2018   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) 06/21/2017   Mastalgia 06/21/2017   Acute diastolic CHF (congestive heart failure) (HCC) 12/20/2016   Adrenal insufficiency (HCC) 12/18/2016   Lobar pneumonia (HCC) 12/15/2016   Acute metabolic encephalopathy 12/15/2016   Thrombocytopenia (HCC) 12/15/2016   CKD (chronic kidney disease) stage 3, GFR 30-59 ml/min (HCC) 12/15/2016   Sepsis (HCC) 12/14/2016   COPD exacerbation (HCC) 04/24/2016   DM type 2 (diabetes mellitus, type 2) (HCC) 04/24/2016   Aortic atherosclerosis (HCC) 04/24/2016   Transaminitis 10/20/2014    Acute bronchitis 10/20/2014   Acute respiratory failure (HCC) 10/17/2014   IBS (irritable bowel syndrome) 08/07/2012   Lower abdominal pain 10/05/2011   Coronary artery disease    Tobacco abuse, in remission    Gastroesophageal reflux disease    Obesity, Class II, BMI 35-39.9 04/01/2010   BLINDNESS 04/01/2010   Hypothyroidism 09/29/2009   Hyperlipidemia, unspecified 09/29/2009   Essential hypertension 09/29/2009    Orientation RESPIRATION BLADDER Height & Weight     Self, Situation, Place  Normal Incontinent Weight: 81.8 kg Height:  5\' 1"  (154.9 cm)  BEHAVIORAL SYMPTOMS/MOOD NEUROLOGICAL BOWEL NUTRITION STATUS      Incontinent Diet (no concentrated sweets)  AMBULATORY STATUS COMMUNICATION OF NEEDS Skin   Extensive Assist Verbally Normal                       Personal Care Assistance Level of Assistance  Bathing, Feeding, Dressing Bathing Assistance: Limited assistance Feeding assistance: Independent Dressing Assistance: Limited assistance     Functional Limitations Info  Sight, Hearing, Speech Sight Info:  (blind) Hearing Info: Adequate Speech Info: Adequate    SPECIAL CARE FACTORS FREQUENCY                       Contractures Contractures Info: Not present    Additional Factors Info  Insulin Sliding Scale       Insulin Sliding Scale Info: see dc summary       Current Medications (09/27/2022):  This is the current hospital active medication list Current Facility-Administered Medications  Medication Dose Route Frequency Provider Last Rate Last Admin  acetaminophen (TYLENOL) tablet 650 mg  650 mg Oral Q6H PRN Elgergawy, Leana Roe, MD   650 mg at 09/26/22 1156   amitriptyline (ELAVIL) tablet 50 mg  50 mg Oral QHS Elgergawy, Leana Roe, MD   50 mg at 09/26/22 2237   apixaban (ELIQUIS) tablet 5 mg  5 mg Oral BID Narda Bonds, MD   5 mg at 09/27/22 0910   azaTHIOprine (IMURAN) tablet 150 mg  150 mg Oral Daily Elgergawy, Leana Roe, MD   150 mg at 09/27/22  0912   Chlorhexidine Gluconate Cloth 2 % PADS 6 each  6 each Topical Daily Narda Bonds, MD   6 each at 09/27/22 0955   Chlorhexidine Gluconate Cloth 2 % PADS 6 each  6 each Topical Q0600 Narda Bonds, MD   6 each at 09/24/22 0544   chlorpheniramine-HYDROcodone (TUSSIONEX) 10-8 MG/5ML suspension 5 mL  5 mL Oral Q12H Shahmehdi, Seyed A, MD   5 mL at 09/27/22 0910   ezetimibe (ZETIA) tablet 10 mg  10 mg Oral Daily Elgergawy, Leana Roe, MD   10 mg at 09/27/22 0912   guaiFENesin-dextromethorphan (ROBITUSSIN DM) 100-10 MG/5ML syrup 10 mL  10 mL Oral Q8H Shahmehdi, Seyed A, MD   10 mL at 09/27/22 0520   HYDROcodone bit-homatropine (HYCODAN) 5-1.5 MG/5ML syrup 5 mL  5 mL Oral Q4H PRN Narda Bonds, MD   5 mL at 09/26/22 0809   insulin aspart (novoLOG) injection 0-15 Units  0-15 Units Subcutaneous TID WC Elgergawy, Leana Roe, MD   5 Units at 09/26/22 1635   insulin aspart (novoLOG) injection 0-5 Units  0-5 Units Subcutaneous QHS Elgergawy, Leana Roe, MD   3 Units at 09/23/22 2147   insulin glargine-yfgn (SEMGLEE) injection 12 Units  12 Units Subcutaneous BID Elgergawy, Leana Roe, MD   12 Units at 09/27/22 0955   ipratropium-albuterol (DUONEB) 0.5-2.5 (3) MG/3ML nebulizer solution 3 mL  3 mL Nebulization Q6H PRN Shahmehdi, Seyed A, MD       levalbuterol (XOPENEX) nebulizer solution 1.25 mg  1.25 mg Nebulization Q8H Shahmehdi, Seyed A, MD       levofloxacin (LEVAQUIN) tablet 500 mg  500 mg Oral Daily Shahmehdi, Seyed A, MD   500 mg at 09/27/22 0912   levothyroxine (SYNTHROID) tablet 100 mcg  100 mcg Oral q AM Narda Bonds, MD   100 mcg at 09/27/22 0520   magnesium oxide (MAG-OX) tablet 400 mg  400 mg Oral Daily Elgergawy, Leana Roe, MD   400 mg at 09/27/22 0911   melatonin tablet 6 mg  6 mg Oral QHS Narda Bonds, MD   6 mg at 09/26/22 2237   metoprolol tartrate (LOPRESSOR) tablet 50 mg  50 mg Oral BID Nevin Bloodgood A, MD   50 mg at 09/27/22 0910   mupirocin ointment (BACTROBAN) 2 % 1 Application   1 Application Nasal BID Narda Bonds, MD   1 Application at 09/27/22 0912   ondansetron (ZOFRAN) tablet 4 mg  4 mg Oral Q6H PRN Elgergawy, Leana Roe, MD       Or   ondansetron (ZOFRAN) injection 4 mg  4 mg Intravenous Q6H PRN Elgergawy, Leana Roe, MD   4 mg at 09/27/22 1024   predniSONE (DELTASONE) tablet 20 mg  20 mg Oral Daily Shahmehdi, Seyed A, MD   20 mg at 09/27/22 0911   sertraline (ZOLOFT) tablet 25 mg  25 mg Oral Daily Elgergawy, Leana Roe, MD   25 mg at 09/27/22  0911     Discharge Medications:  Medication List       STOP taking these medications     ascorbic acid 500 MG tablet Commonly known as: VITAMIN C    aspirin 81 MG tablet    gabapentin 100 MG capsule Commonly known as: NEURONTIN    losartan 100 MG tablet Commonly known as: COZAAR    methocarbamol 500 MG tablet Commonly known as: ROBAXIN    metoprolol succinate 25 MG 24 hr tablet Commonly known as: Toprol XL    tiZANidine 4 MG tablet Commonly known as: ZANAFLEX    zinc sulfate 220 (50 Zn) MG capsule           TAKE these medications     acetaminophen 325 MG tablet Commonly known as: TYLENOL Take 650 mg by mouth every 6 (six) hours as needed.    albuterol 108 (90 Base) MCG/ACT inhaler Commonly known as: VENTOLIN HFA Inhale 2 puffs into the lungs every 6 (six) hours as needed for wheezing or shortness of breath.    amitriptyline 50 MG tablet Commonly known as: ELAVIL Take 50 mg by mouth at bedtime.    apixaban 5 MG Tabs tablet Commonly known as: ELIQUIS Take 1 tablet (5 mg total) by mouth 2 (two) times daily.    azaTHIOprine 50 MG tablet Commonly known as: IMURAN Take 3 tablets (150 mg total) by mouth daily.    benzonatate 100 MG capsule Commonly known as: Tessalon Perles Take 1 capsule (100 mg total) by mouth 3 (three) times daily as needed for cough.    calcium carbonate 500 MG chewable tablet Commonly known as: TUMS - dosed in mg elemental calcium Chew 1,000 mg by mouth every 8  (eight) hours as needed for heartburn.    Cholecalciferol 50 MCG (2000 UT) Tabs Take 2,000 Units by mouth daily at 6 (six) AM.    ezetimibe 10 MG tablet Commonly known as: ZETIA Take 1 tablet (10 mg total) by mouth daily.    glipiZIDE 5 MG tablet Commonly known as: GLUCOTROL Take 5 mg by mouth 2 (two) times daily.    guaiFENesin-dextromethorphan 100-10 MG/5ML syrup Commonly known as: ROBITUSSIN DM Take 10 mLs by mouth every 8 (eight) hours.    Hemorrhoidal 0.25-14-71.9 % Oint Generic drug: Phenylephrine-Mineral Oil-Pet Place 1 application rectally every 6 (six) hours as needed (hemorrhoid discomfort).    insulin lispro 100 UNIT/ML KwikPen Commonly known as: HUMALOG Inject 2-15 Units into the skin 3 (three) times daily before meals. 121-150=2 units, 151-200= 3 units, 201-250 = 5 units, 251-300= 8 units, 301-400 = 15 units    Lantus SoloStar 100 UNIT/ML Solostar Pen Generic drug: insulin glargine Inject 20 Units into the skin 2 (two) times daily. Prime pen with 2u prior to each use    levofloxacin 500 MG tablet Commonly known as: LEVAQUIN Take 1 tablet (500 mg total) by mouth daily for 5 days. Start taking on: September 28, 2022    levothyroxine 100 MCG tablet Commonly known as: SYNTHROID Take 1 tablet (100 mcg total) by mouth in the morning. Start taking on: September 28, 2022 What changed:  medication strength how much to take    lidocaine 5 % Commonly known as: LIDODERM Place 1 patch onto the skin daily.    magnesium oxide 400 MG tablet Commonly known as: MAG-OX Take 400 mg by mouth daily.    Melatonin 5 MG Caps Take 5 mg by mouth at bedtime.    metoprolol tartrate 50 MG tablet Commonly known  as: LOPRESSOR Take 1 tablet (50 mg total) by mouth 2 (two) times daily.    mupirocin ointment 2 % Commonly known as: BACTROBAN Place 1 Application into the nose 2 (two) times daily for 5 days.    nystatin powder Generic drug: nystatin Apply topically.    predniSONE 10 MG  tablet Commonly known as: DELTASONE Take 1 tablet (10 mg total) by mouth daily for 3 days. What changed: Another medication with the same name was removed. Continue taking this medication, and follow the directions you see here.    sertraline 25 MG tablet Commonly known as: ZOLOFT Take 25 mg by mouth daily.      Relevant Imaging Results:  Relevant Lab Results:   Additional Information SSN: [id number]  Hancel Ion, Lurena Joiner, RN

## 2022-09-27 NOTE — Discharge Summary (Signed)
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 [facility], [address] [place], [place] [address]    Report Status 09/27/2022 FINAL  Final  Urine Culture     Status: Abnormal   Collection Time: 09/22/22  6:15 PM   Specimen: Urine, Random  Result Value Ref Range Status   Specimen Description   Final    URINE, RANDOM Performed at [facility], [address] [place], [place] [address]    Special Requests   Final    NONE Reflexed from [id number] Performed at [facility], [address] [place], [place] [address]    Culture (A)  Final    <10,000 COLONIES/mL INSIGNIFICANT GROWTH Performed at [facility], [address], [place], [place] [address]    Report Status 09/23/2022 FINAL  Final  MRSA Next Gen by PCR, Nasal     Status: Abnormal    Collection Time: 09/23/22 12:09 PM   Specimen: Nasal Mucosa; Nasal Swab  Result Value Ref Range Status   MRSA by PCR Next Gen MRSA DETECTED (A) NOT DETECTED Final    Comment: CRITICAL RESULT CALLED TO, READ BACK BY AND VERIFIED WITH: FOLEY,B. @ 1626 ON 09/23/22 BY FRATTO,A.        The GeneXpert MRSA Assay (FDA approved for NASAL specimens only), is one component of a comprehensive MRSA colonization surveillance program. It is not intended to diagnose MRSA infection nor to guide or monitor treatment for MRSA infections. Performed at [facility], [address], [place], [place] [address]   Respiratory (~20 pathogens) panel by PCR     Status: None   Collection Time: 09/23/22  2:50 PM   Specimen: Nasopharyngeal Swab; Respiratory  Result Value Ref Range Status   Adenovirus NOT DETECTED NOT DETECTED Final   Coronavirus 229E NOT DETECTED NOT DETECTED Final    Comment: (NOTE) The Coronavirus on the Respiratory Panel, DOES NOT test for the novel  Coronavirus (2019 nCoV)    Coronavirus HKU1 NOT DETECTED NOT DETECTED Final   Coronavirus NL63 NOT DETECTED NOT DETECTED Final   Coronavirus OC43 NOT  DETECTED NOT DETECTED Final   Metapneumovirus NOT DETECTED NOT DETECTED Final   Rhinovirus / Enterovirus NOT DETECTED NOT DETECTED Final   Influenza A NOT DETECTED NOT DETECTED Final   Influenza B NOT DETECTED NOT DETECTED Final   Parainfluenza Virus 1 NOT DETECTED NOT DETECTED Final   Parainfluenza Virus 2 NOT DETECTED NOT DETECTED Final   Parainfluenza Virus 3 NOT DETECTED NOT DETECTED Final   Parainfluenza Virus 4 NOT DETECTED NOT DETECTED Final   Respiratory Syncytial Virus NOT DETECTED NOT DETECTED Final   Bordetella pertussis NOT DETECTED NOT DETECTED Final   Bordetella Parapertussis NOT DETECTED NOT DETECTED Final   Chlamydophila pneumoniae NOT DETECTED NOT DETECTED Final   Mycoplasma pneumoniae NOT DETECTED NOT DETECTED Final    Comment: Performed at [facility], [address], [place], [place] [address]    Labs: CBC: Recent Labs  Lab 09/22/22 1258 09/23/22 0435 09/25/22 0846 09/27/22 0543  WBC 4.9 3.9* 8.3 7.5  NEUTROABS 3.1  --  5.7  --   HGB 9.9* 11.5* 11.0* 12.5  HCT 31.6* 36.5 34.4* 38.6  MCV 100.3* 101.1* 98.9 98.2  PLT 196 213 356 404*   Basic Metabolic Panel: Recent Labs  Lab 09/22/22 1258 09/23/22 0435 09/25/22 0846 09/27/22 0543  NA 135 137 141 141  K 3.7 4.6 3.8 3.9  CL 99 100 105 101  CO2 27 23 25 28   GLUCOSE 199* 246* 160* 106*  BUN 13 11 29* 22  CREATININE 0.87 0.66 0.75 0.84  CALCIUM 8.1* 8.6* 8.8* 9.0   Liver Function Tests: Recent Labs  Lab 09/22/22 1258 09/25/22 0846  AST 36 78*  ALT 17 40  ALKPHOS 43 50  BILITOT 1.0 0.5  PROT 6.5 7.0  ALBUMIN 2.6* 2.9*   CBG: Recent Labs  Lab 09/26/22 0740 09/26/22 1131 09/26/22 1633 09/26/22 2038 09/27/22 0746  GLUCAP 139* 214* 204* 132* 106*    Discharge time spent: greater than 40 minutes.  Signed: Kendell Bane, MD [facility] 09/27/2022

## 2022-09-27 NOTE — Progress Notes (Signed)
Rounding Note    Patient Name: [name] Date of Encounter: 09/27/2022  [facility] Cardiologist: Dina Rich, MD   Subjective   Nausea, vomiting this am. No palpitations.   Inpatient Medications    Scheduled Meds:  amitriptyline  50 mg Oral QHS   apixaban  5 mg Oral BID   azaTHIOprine  150 mg Oral Daily   Chlorhexidine Gluconate Cloth  6 each Topical Daily   Chlorhexidine Gluconate Cloth  6 each Topical Q0600   chlorpheniramine-HYDROcodone  5 mL Oral Q12H   digoxin  0.25 mg Oral Daily   ezetimibe  10 mg Oral Daily   guaiFENesin-dextromethorphan  10 mL Oral Q8H   insulin aspart  0-15 Units Subcutaneous TID WC   insulin aspart  0-5 Units Subcutaneous QHS   insulin glargine-yfgn  12 Units Subcutaneous BID   levalbuterol  1.25 mg Nebulization Q8H   levofloxacin  500 mg Oral Daily   levothyroxine  100 mcg Oral q AM   magnesium oxide  400 mg Oral Daily   melatonin  6 mg Oral QHS   metoprolol tartrate  50 mg Oral BID   mupirocin ointment  1 Application Nasal BID   predniSONE  20 mg Oral Daily   sertraline  25 mg Oral Daily   Continuous Infusions:  PRN Meds: acetaminophen, HYDROcodone bit-homatropine, ipratropium-albuterol, ondansetron **OR** ondansetron (ZOFRAN) IV   Vital Signs    Vitals:   09/26/22 1102 09/26/22 1103 09/26/22 2047 09/27/22 0609  BP: (!) 157/73  (!) 170/90 135/68  Pulse:   71 85  Resp: 19 19 20 16   Temp:   98.5 F (36.9 C) 97.7 F (36.5 C)  TempSrc:   Oral   SpO2:   98% 98%  Weight:      Height:        Intake/Output Summary (Last 24 hours) at 09/27/2022 0958 Last data filed at 09/27/2022 0630 Gross per 24 hour  Intake 580 ml  Output 200 ml  Net 380 ml      09/26/2022    5:16 AM 09/25/2022    4:50 AM 09/25/2022    4:43 AM  Last 3 Weights  Weight (lbs) 180 lb 5.4 oz 189 lb 9.5 oz 189 lb 9.5 oz  Weight (kg) 81.8 kg 86 kg 86 kg      Telemetry    N/a - Personally Reviewed  ECG    N/a - Personally  Reviewed  Physical Exam   GEN: No acute distress.   Neck: No JVD Cardiac: irreg  Respiratory: Clear to auscultation bilaterally. GI: Soft, nontender, non-distended  MS: No edema; No deformity. Neuro:  Nonfocal  Psych: Normal affect   Labs    High Sensitivity Troponin:   Recent Labs  Lab 09/22/22 1258  TROPONINIHS 49*     Chemistry Recent Labs  Lab 09/22/22 1258 09/23/22 0435 09/25/22 0846 09/27/22 0543  NA 135 137 141 141  K 3.7 4.6 3.8 3.9  CL 99 100 105 101  CO2 27 23 25 28   GLUCOSE 199* 246* 160* 106*  BUN 13 11 29* 22  CREATININE 0.87 0.66 0.75 0.84  CALCIUM 8.1* 8.6* 8.8* 9.0  PROT 6.5  --  7.0  --   ALBUMIN 2.6*  --  2.9*  --   AST 36  --  78*  --   ALT 17  --  40  --   ALKPHOS 43  --  50  --   BILITOT 1.0  --  0.5  --  GFRNONAA >60 >60 >60 >60  ANIONGAP 9 14 11 12     Lipids No results for input(s): "CHOL", "TRIG", "HDL", "LABVLDL", "LDLCALC", "CHOLHDL" in the last 168 hours.  Hematology Recent Labs  Lab 09/23/22 0435 09/25/22 0846 09/27/22 0543  WBC 3.9* 8.3 7.5  RBC 3.61* 3.48* 3.93  HGB 11.5* 11.0* 12.5  HCT 36.5 34.4* 38.6  MCV 101.1* 98.9 98.2  MCH 31.9 31.6 31.8  MCHC 31.5 32.0 32.4  RDW 16.1* 16.5* 15.4  PLT 213 356 404*   Thyroid  Recent Labs  Lab 09/23/22 0538  TSH 0.237*    BNPNo results for input(s): "BNP", "PROBNP" in the last 168 hours.  DDimer No results for input(s): "DDIMER" in the last 168 hours.   Radiology    No results found.  Cardiac Studies     Patient Profile     Patient is a 77 y/o F known to have CAD s/p RCA, Lcx and OM PCI, HTN, DM 2, HLD, moderate COPD, NASH and autoimmune hepatitis, blindness is currently admitted to hospitalist team for the management of pneumonia with new onset atrial fibrillation with RVR.   Assessment & Plan    New onset afib with RVR -new diagnosis of afib with RVR this admit in setting of pneumonia - initially on dilt gtt, loaded with IV digoxin - currently on digoxin  0.25mg , metoprolol 50mg  bid.  - on eliquis 5mg  bid for stroke prevention  - not on tele currently. Rates well controlled by vitals - bp's are fine, room to titrate lopressor if needed. BP's would likely tolerate low dose diltiazem if had to be added. Will d/c digoxin.  - would plan on outpatient monitor at f/u to reassess afib burden and need for ongoing anticoag - history of hypothyroid but patient hyperthyroid by labs this admit, her home synthroid was lowered  No additional cardiology recs at this time, we will sign off inpatient care and arrange f/u  For questions or updates, please contact [facility] Please consult [email or web address] for contact info under        Signed, Dina Rich, MD  09/27/2022, 9:58 AM

## 2022-09-27 NOTE — TOC Transition Note (Signed)
Transition of Care [facility]) - CM/SW Discharge Note   Patient Details  Name: AVANGELINE STOCKBURGER MRN: [id number] Date of Birth: 15-Mar-1946  Transition of Care [facility]) CM/SW Contact:  Leitha Bleak, RN Phone Number: 09/27/2022, 11:38 AM   Clinical Narrative:   Patient is ready for discharge. CM called Olegario Messier at [facility]. They will have someone from facility pick her up around 3PM. RN updated. TOC faxing DC summary and FL2 to facility. Confirmed fax number.      Final next level of care: Assisted Living Barriers to Discharge: Barriers Resolved   Patient Goals and CMS Choice      Discharge Placement                  Patient to be transferred to facility by: Staff from ALF Name of family member notified: Olegario Messier - ALF Patient and family notified of of transfer: 09/27/22  Discharge Plan and Services Additional resources added to the After Visit Summary for   In-house Referral: Clinical Social Work                 Social Determinants of Health (SDOH) Interventions SDOH Screenings   Food Insecurity: No Food Insecurity (09/24/2022)  Housing: Low Risk  (09/24/2022)  Transportation Needs: No Transportation Needs (09/24/2022)  Utilities: Not At Risk (09/24/2022)  Tobacco Use: Medium Risk (09/22/2022)     Readmission Risk Interventions    09/23/2022    2:48 PM  Readmission Risk Prevention Plan  Transportation Screening Complete  HRI or Home Care Consult Complete  Social Work Consult for Recovery Care Planning/Counseling Complete  Palliative Care Screening Not Applicable  Medication Review Oceanographer) Complete

## 2022-09-28 ENCOUNTER — Encounter (INDEPENDENT_AMBULATORY_CARE_PROVIDER_SITE_OTHER): Payer: Self-pay

## 2022-09-28 DIAGNOSIS — D649 Anemia, unspecified: Secondary | ICD-10-CM | POA: Diagnosis not present

## 2022-09-28 DIAGNOSIS — H547 Unspecified visual loss: Secondary | ICD-10-CM | POA: Diagnosis not present

## 2022-09-28 DIAGNOSIS — I10 Essential (primary) hypertension: Secondary | ICD-10-CM | POA: Diagnosis not present

## 2022-09-28 DIAGNOSIS — E559 Vitamin D deficiency, unspecified: Secondary | ICD-10-CM | POA: Diagnosis not present

## 2022-09-28 DIAGNOSIS — K219 Gastro-esophageal reflux disease without esophagitis: Secondary | ICD-10-CM | POA: Diagnosis not present

## 2022-09-28 DIAGNOSIS — I2510 Atherosclerotic heart disease of native coronary artery without angina pectoris: Secondary | ICD-10-CM | POA: Diagnosis not present

## 2022-09-28 DIAGNOSIS — E785 Hyperlipidemia, unspecified: Secondary | ICD-10-CM | POA: Diagnosis not present

## 2022-09-28 DIAGNOSIS — K589 Irritable bowel syndrome without diarrhea: Secondary | ICD-10-CM | POA: Diagnosis not present

## 2022-09-28 DIAGNOSIS — E039 Hypothyroidism, unspecified: Secondary | ICD-10-CM | POA: Diagnosis not present

## 2022-09-28 DIAGNOSIS — E119 Type 2 diabetes mellitus without complications: Secondary | ICD-10-CM | POA: Diagnosis not present

## 2022-09-28 NOTE — Telephone Encounter (Signed)
Appt reminder for pre op faxed to [employer] Pt pre op 10/05/22 at 12:45 pm [facility]

## 2022-09-30 DIAGNOSIS — Z79899 Other long term (current) drug therapy: Secondary | ICD-10-CM | POA: Diagnosis not present

## 2022-09-30 DIAGNOSIS — E038 Other specified hypothyroidism: Secondary | ICD-10-CM | POA: Diagnosis not present

## 2022-10-04 ENCOUNTER — Telehealth (INDEPENDENT_AMBULATORY_CARE_PROVIDER_SITE_OTHER): Payer: Self-pay | Admitting: Gastroenterology

## 2022-10-04 NOTE — Telephone Encounter (Signed)
I spoke with the facility that the patient is at and talked to the nurse [place]. I made her aware that per Dr. Levon Hedger will reschedule the procedure. I informed her that I will cancel the appointment with Edd Fabian on 5/8 and keep the appointment on 5/23. [name] verbalized understanding and thanked me for the call.

## 2022-10-04 NOTE — Telephone Encounter (Signed)
   Name: [name]  DOB: 05-25-1946  MRN: [id number]  Primary Cardiologist: Dina Rich, MD  Chart reviewed as part of pre-operative protocol coverage. Because of [name]'s past medical history and time since last visit, she will require a follow-up in-office visit in order to better assess preoperative cardiovascular risk. Patient was just hospitalized and has a follow-up visit scheduled on 10/21/2022. I have updated appointment notes to reflect pre-op evaluation.   Pre-op covering staff:  - Please contact requesting surgeon's office via preferred method (i.e, phone, fax) to inform them of need for appointment prior to surgery.  This message will also be routed to pharmacy pool for input on holding Eliquis as requested below so that this information is available to the clearing provider at time of patient's appointment.   Carlos Levering, NP  10/04/2022, 1:02 PM

## 2022-10-04 NOTE — Telephone Encounter (Signed)
    10/04/22  [name] 1946-03-31  What type of surgery is being performed? Colonoscopy   When is surgery scheduled? 10/07/22  Clearance to hold Eliquis  Name of physician performing surgery?  Dr. Katrinka Blazing [facility] at [facility] Phone: [phone] Fax: [phone]  Anethesia type (none, local, MAC, general)? MAC

## 2022-10-04 NOTE — Telephone Encounter (Signed)
I left a message for the the requesting provider's office to give our office a call back to inform them about updated appointments and pre-op recommendations

## 2022-10-04 NOTE — Telephone Encounter (Signed)
Patient with diagnosis of afib on Eliquis for anticoagulation.    Procedure: colonoscopy Date of procedure: 10/07/22   CHA2DS2-VASc Score = 5   This indicates a 7.2% annual risk of stroke. The patient's score is based upon: CHF History: 0 HTN History: 1 Diabetes History: 1 Stroke History: 0 Vascular Disease History: 1 Age Score: 1 Gender Score: 1     CrCl 56 ml/min  New set afib in the setting of infection. I do not see any plans for cardioversion.  Per office protocol, patient can hold Eliquis for 2 days prior to procedure.    **This guidance is not considered finalized until pre-operative APP has relayed final recommendations.**

## 2022-10-04 NOTE — Telephone Encounter (Signed)
[name], MD  Lillia Mountain, RN; Marlowe Shores, LPN Per cardiology note, ok to hold Eliquis for 48 hours Thanks  Previous Messages  ----- Message ----- From: Lillia Mountain, RN Sent: 10/04/2022  10:36 AM EDT To: Marlowe Shores, LPN; * Subject: Patient in ED with new onset A-Fib and pneum*  [name],  [name] was in the ED on 09/27/2022 with new onset of A-Fib and pneumonia. She was started on Eliquis.   She is a patient at [facility].  She is for colonoscopy on 10/07/2022  Thanks,  Cliffton Asters RN  \    This note printed out and faxed to [facility] along with reminder for pre op tomorrow.

## 2022-10-04 NOTE — Telephone Encounter (Signed)
Hi Natasha, Per anesthesia, we will need to obtain pre op clearance for procedure. Tanya, please let the patient know her procedure will be rescheduled, will need to wait at least 2 weeks for the procedure and will need to be seen by cardiology.

## 2022-10-05 ENCOUNTER — Encounter (HOSPITAL_COMMUNITY): Admission: RE | Admit: 2022-10-05 | Payer: Medicare Other | Source: Ambulatory Visit

## 2022-10-05 ENCOUNTER — Telehealth (INDEPENDENT_AMBULATORY_CARE_PROVIDER_SITE_OTHER): Payer: Self-pay | Admitting: Gastroenterology

## 2022-10-05 DIAGNOSIS — E162 Hypoglycemia, unspecified: Secondary | ICD-10-CM | POA: Diagnosis not present

## 2022-10-05 NOTE — Telephone Encounter (Signed)
Lillia Mountain, RN  Marlowe Shores, LPN; Marguerita Merles, Reuel Boom, MD Halina Andreas,  Dr Johnnette Litter wants [facility] to be rescheduled for two weeks out due to recent pneumonia diagnosis and cardiac clearance.  Thanks,  Cliffton Asters RN  Pt scheduled for TCS on 10/07/22. Contacted [facility] but had to leave message.

## 2022-10-05 NOTE — Telephone Encounter (Signed)
Message left on voicemail at [facility].

## 2022-10-05 NOTE — Telephone Encounter (Signed)
Print production planner from [employer] returned call. Pt has cardiology appt on 10/21/22; after that appt we will call [facility] back to reschedule.

## 2022-10-06 ENCOUNTER — Encounter (INDEPENDENT_AMBULATORY_CARE_PROVIDER_SITE_OTHER): Payer: Self-pay | Admitting: Gastroenterology

## 2022-10-06 ENCOUNTER — Ambulatory Visit: Payer: Medicare Other | Admitting: General Practice

## 2022-10-07 ENCOUNTER — Ambulatory Visit (HOSPITAL_COMMUNITY): Admission: RE | Admit: 2022-10-07 | Payer: Medicare Other | Source: Home / Self Care | Admitting: Gastroenterology

## 2022-10-07 ENCOUNTER — Encounter (HOSPITAL_COMMUNITY): Admission: RE | Payer: Self-pay | Source: Home / Self Care

## 2022-10-07 SURGERY — COLONOSCOPY WITH PROPOFOL
Anesthesia: Monitor Anesthesia Care

## 2022-10-10 DIAGNOSIS — E119 Type 2 diabetes mellitus without complications: Secondary | ICD-10-CM | POA: Diagnosis not present

## 2022-10-10 DIAGNOSIS — E559 Vitamin D deficiency, unspecified: Secondary | ICD-10-CM | POA: Diagnosis not present

## 2022-10-10 DIAGNOSIS — J449 Chronic obstructive pulmonary disease, unspecified: Secondary | ICD-10-CM | POA: Diagnosis not present

## 2022-10-10 DIAGNOSIS — D518 Other vitamin B12 deficiency anemias: Secondary | ICD-10-CM | POA: Diagnosis not present

## 2022-10-10 DIAGNOSIS — E039 Hypothyroidism, unspecified: Secondary | ICD-10-CM | POA: Diagnosis not present

## 2022-10-10 DIAGNOSIS — I10 Essential (primary) hypertension: Secondary | ICD-10-CM | POA: Diagnosis not present

## 2022-10-10 DIAGNOSIS — G8929 Other chronic pain: Secondary | ICD-10-CM | POA: Diagnosis not present

## 2022-10-10 DIAGNOSIS — N189 Chronic kidney disease, unspecified: Secondary | ICD-10-CM | POA: Diagnosis not present

## 2022-10-10 DIAGNOSIS — I5030 Unspecified diastolic (congestive) heart failure: Secondary | ICD-10-CM | POA: Diagnosis not present

## 2022-10-10 DIAGNOSIS — E7849 Other hyperlipidemia: Secondary | ICD-10-CM | POA: Diagnosis not present

## 2022-10-12 DIAGNOSIS — R011 Cardiac murmur, unspecified: Secondary | ICD-10-CM | POA: Diagnosis not present

## 2022-10-19 DIAGNOSIS — R058 Other specified cough: Secondary | ICD-10-CM | POA: Diagnosis not present

## 2022-10-19 DIAGNOSIS — R0689 Other abnormalities of breathing: Secondary | ICD-10-CM | POA: Diagnosis not present

## 2022-10-19 DIAGNOSIS — R0902 Hypoxemia: Secondary | ICD-10-CM | POA: Diagnosis not present

## 2022-10-19 DIAGNOSIS — K5900 Constipation, unspecified: Secondary | ICD-10-CM | POA: Diagnosis not present

## 2022-10-19 DIAGNOSIS — R0981 Nasal congestion: Secondary | ICD-10-CM | POA: Diagnosis not present

## 2022-10-20 DIAGNOSIS — R051 Acute cough: Secondary | ICD-10-CM | POA: Diagnosis not present

## 2022-10-20 DIAGNOSIS — R059 Cough, unspecified: Secondary | ICD-10-CM | POA: Diagnosis not present

## 2022-10-21 ENCOUNTER — Encounter: Payer: Self-pay | Admitting: Cardiology

## 2022-10-21 ENCOUNTER — Ambulatory Visit: Payer: Medicare Other | Attending: Cardiology | Admitting: Cardiology

## 2022-10-21 VITALS — BP 130/70 | HR 62 | Ht 61.0 in | Wt 180.6 lb

## 2022-10-21 DIAGNOSIS — I10 Essential (primary) hypertension: Secondary | ICD-10-CM | POA: Diagnosis not present

## 2022-10-21 DIAGNOSIS — Z01810 Encounter for preprocedural cardiovascular examination: Secondary | ICD-10-CM | POA: Diagnosis not present

## 2022-10-21 DIAGNOSIS — E119 Type 2 diabetes mellitus without complications: Secondary | ICD-10-CM | POA: Diagnosis not present

## 2022-10-21 DIAGNOSIS — I2510 Atherosclerotic heart disease of native coronary artery without angina pectoris: Secondary | ICD-10-CM | POA: Diagnosis not present

## 2022-10-21 DIAGNOSIS — I480 Paroxysmal atrial fibrillation: Secondary | ICD-10-CM | POA: Diagnosis not present

## 2022-10-21 DIAGNOSIS — E782 Mixed hyperlipidemia: Secondary | ICD-10-CM | POA: Diagnosis not present

## 2022-10-21 DIAGNOSIS — J189 Pneumonia, unspecified organism: Secondary | ICD-10-CM

## 2022-10-21 NOTE — Patient Instructions (Signed)
Medication Instructions:  Your physician recommends that you continue on your current medications as directed. Please refer to the Current Medication list given to you today.  *If you need a refill on your cardiac medications before your next appointment, please call your pharmacy*   Lab Work: NONE  If you have labs (blood work) drawn today and your tests are completely normal, you will receive your results only by: MyChart Message (if you have MyChart) OR A paper copy in the mail If you have any lab test that is abnormal or we need to change your treatment, we will call you to review the results.   Testing/Procedures: NONE    Follow-Up: At [facility], you and your health needs are our priority.  As part of our continuing mission to provide you with exceptional heart care, we have created designated Provider Care Teams.  These Care Teams include your primary Cardiologist (physician) and Advanced Practice Providers (APPs -  Physician Assistants and Nurse Practitioners) who all work together to provide you with the care you need, when you need it.  We recommend signing up for the patient portal called "MyChart".  Sign up information is provided on this After Visit Summary.  MyChart is used to connect with patients for Virtual Visits (Telemedicine).  Patients are able to view lab/test results, encounter notes, upcoming appointments, etc.  Non-urgent messages can be sent to your provider as well.   To learn more about what you can do with MyChart, go to [email or web address].    Your next appointment:   6 month(s)  Provider:   You may see Branch, Jonathan, MD or one of the following Advanced Practice Providers on your designated Care Team:   Brittany Strader, PA-C  Michele Lenze, PA-C     Other Instructions Thank you for choosing [facility]!    

## 2022-10-21 NOTE — Progress Notes (Signed)
Cardiology Office Note:   Date:  10/21/2022  ID:  [name], DOB [birth year], MRN [id number]  History of Present Illness:   [name] is a 77 y.o. female with a past medical history of coronary artery disease, hypertension, hyperlipidemia, type 2 diabetes, COPD,NASH with autoimmune hepatitis, hypothyroidism, obesity, legally blind, and new onset atrial fibrillation who is here today for follow-up after recent hospitalization.  Patient was sent to the [facility] emergency department from her facility due to fever and confusion.  She had noted to be confused and had a cough that was lasted for the past couple of days that was nonproductive.  Chest x-ray was ordered at the facility but the patient declined.  She started with complaints in the morning of a sore throat and had a fever of 102.9, she was unable to swallow, saturating 88% on room air, so EMS was called.  She was initiated on 2 L of O2 via nasal cannula.  Blood pressure was noted to be 97/59.  Glucose was noted to be 61.  CT of the chest was not nontypical nodularity, discerning for atypical pneumonia, blood pressure stable in the ED, no leukocytosis was noted, lactic acid within normal limits.  During hospitalization she developed new onset atrial fibrillation with RVR and cardiology was consulted for recommendations for further evaluation.  She was initiated on a diltiazem drip and was given IV metoprolol 5 mg as needed for heart rate greater than 140.  Aspirin was stopped and she was started on apixaban 5 mg twice daily, she continued to have elevated rates with diltiazem uptitrated to 15 mg an hour, she had IV digoxin loading and was continued on IV dig 0.5 mg followed by 0.265 mg.  Echocardiogram was ordered and completed.  Rates were better controlled digoxin was discontinued and the plan was for an outpatient monitor and follow-up to reassess atrial fibrillation burden.  She was treated for atypical pneumonia as well as her A-fib RVR and was  considered stable for discharge on 09/27/2022.  She returns clinic today accompanied by worker from the facility.  She was scheduled for routine colonoscopy and it was deemed by anesthesia that she needed clearance prior to being able to have her procedure.  She denies any chest pain or shortness of breath but [place]  She continues to have a congested nonproductive cough after recent hospital discharge and treated for atypical pneumonia.  She has not noticed any issues with bleeding and the staff at the facility has not noticed any blood in her urine or stool.  She has been compliant with her medications and has not missed any of her apixaban.  ROS: 10 point review of systems is completed and considered negative with exception of what is listed in the HPI  Studies Reviewed:    EKG: Sinus rhythm with a rate of 63 with nonspecific ST and T waves, no acute change from prior studies noted  TTE 09/24/22 1. Left ventricular ejection fraction, by estimation, is 50 to 55%. The  left ventricle has low normal function. Left ventricular endocardial  border not optimally defined to evaluate regional wall motion. Left  ventricular diastolic function could not be  evaluated.   2. Right ventricular systolic function was not well visualized. The right  ventricular size is normal. There is normal pulmonary artery systolic  pressure.   3. Left atrial size was moderately dilated.   4. The mitral valve is grossly  normal. Mild mitral valve regurgitation.  No evidence of mitral stenosis.   5. The aortic valve was not well visualized. Aortic valve regurgitation  is not visualized. No aortic stenosis is present.   6. The inferior vena cava is normal in size with greater than 50%  respiratory variability, suggesting right atrial pressure of 3 mmHg.   Risk Assessment/Calculations:    CHA2DS2-VASc Score = 5   This indicates a 7.2% annual risk of stroke. The patient's  score is based upon: CHF History: 0 HTN History: 1 Diabetes History: 1 Stroke History: 0 Vascular Disease History: 1 Age Score: 1 Gender Score: 1             Physical Exam:   VS:  BP 130/70   Pulse 62   Ht 5\' 1"  (1.549 m)   Wt 180 lb 9.6 oz (81.9 kg)   SpO2 96%   BMI 34.12 kg/m    Wt Readings from Last 3 Encounters:  10/21/22 180 lb 9.6 oz (81.9 kg)  09/26/22 180 lb 5.4 oz (81.8 kg)  09/09/22 193 lb (87.5 kg)     GEN: Well nourished, well developed in no acute distress NECK: No JVD; No carotid bruits CARDIAC: RRR, no murmurs, rubs, gallops RESPIRATORY:  Clear to auscultation without rales, wheezing or rhonchi  ABDOMEN: Soft, non-tender, non-distended EXTREMITIES:  No edema; No deformity   ASSESSMENT AND PLAN:   Preoperative clearance for colonoscopy.  Patient is deemed at moderate risk due to recent diagnosis of pneumonia with continued some mild respiratory insufficiency on antibiotic therapy as well as moderate COPD.  Recommend waiting until patient is finished with treatment for pneumonia and symptoms have improved prior to undergoing moderate sedation for colonoscopy procedure at this time.  Paroxysmal atrial fibrillation with new onset A-fib RVR during recent hospitalization when she was treated for atypical pneumonia.  EKG revealed sinus rhythm today.  She has been continued on apixaban 5 mg daily for CHA2DS2-VASc score of at least 5 for stroke prophylaxis.  And metoprolol tartrate 50 mg twice daily.   Coronary artery disease with history of PCI to the RCA, left circumflex and OM.  Denies any angina anginal equivalents.  No ischemic changes noted on her EKG.  She is continued on apixaban and lieu of aspirin.  Is also continued ezetimibe 10 mg daily.  Essential hypertension with blood pressure of 130/70.  Blood pressure remained stable.  She is continued on metoprolol 50 mg twice daily.  Facility can continue to monitor her blood pressure per protocol.  Hyperlipidemia  which she is continued on ezetimibe 10 mg daily.  This continues to be managed by her PCP.  Recent hospitalization for atypical pneumonia where she continues to have congested nonproductive cough.  She is continued on albuterol and Levaquin.  Type 2 diabetes which she is continued on glipizide 5 mg twice daily.  This continues to be managed by her PCP.  Disposition patient return to clinic to see MD/APP in 6 months or sooner if needed.  If she continues to have bouts of atrial fibrillation can consider placing on heart monitor to determine burden of atrial fibrillation.     Ms. Kohout perioperative risk of a major cardiac event is 0.9% according to the Revised Cardiac Risk Index (RCRI).  Therefore, she is at high risk for perioperative complications.   Her functional capacity is poor at 2.74 METs according to the Duke Activity Status Index (DASI). Recommendations: The patient is at high risk for perioperative cardiac complications and  is at a low functional capacity.  However, further testing will not change how her cardiac status is managed.  Proceed with surgery at moderate risk.  Would recommend waiting until patient is over pneumonia as she was recently hospitalized continues to have some respiratory insufficiency and has been continued on antibiotics. Antiplatelet and/or Anticoagulation Recommendations: Eliquis (Apixaban) can be held for 2 days prior to surgery.  Please resume post op when felt to be safe.            Signed, Malayiah Mcbrayer, NP

## 2022-10-26 DIAGNOSIS — E119 Type 2 diabetes mellitus without complications: Secondary | ICD-10-CM | POA: Diagnosis not present

## 2022-10-26 DIAGNOSIS — I10 Essential (primary) hypertension: Secondary | ICD-10-CM | POA: Diagnosis not present

## 2022-10-26 DIAGNOSIS — D649 Anemia, unspecified: Secondary | ICD-10-CM | POA: Diagnosis not present

## 2022-10-26 DIAGNOSIS — I2510 Atherosclerotic heart disease of native coronary artery without angina pectoris: Secondary | ICD-10-CM | POA: Diagnosis not present

## 2022-10-26 DIAGNOSIS — K589 Irritable bowel syndrome without diarrhea: Secondary | ICD-10-CM | POA: Diagnosis not present

## 2022-10-26 DIAGNOSIS — G629 Polyneuropathy, unspecified: Secondary | ICD-10-CM | POA: Diagnosis not present

## 2022-10-26 DIAGNOSIS — E785 Hyperlipidemia, unspecified: Secondary | ICD-10-CM | POA: Diagnosis not present

## 2022-10-26 DIAGNOSIS — I5030 Unspecified diastolic (congestive) heart failure: Secondary | ICD-10-CM | POA: Diagnosis not present

## 2022-10-26 DIAGNOSIS — J449 Chronic obstructive pulmonary disease, unspecified: Secondary | ICD-10-CM | POA: Diagnosis not present

## 2022-10-27 DIAGNOSIS — E1165 Type 2 diabetes mellitus with hyperglycemia: Secondary | ICD-10-CM | POA: Diagnosis not present

## 2022-11-04 ENCOUNTER — Telehealth (INDEPENDENT_AMBULATORY_CARE_PROVIDER_SITE_OTHER): Payer: Self-pay | Admitting: Gastroenterology

## 2022-11-04 DIAGNOSIS — E782 Mixed hyperlipidemia: Secondary | ICD-10-CM | POA: Diagnosis not present

## 2022-11-04 DIAGNOSIS — E119 Type 2 diabetes mellitus without complications: Secondary | ICD-10-CM | POA: Diagnosis not present

## 2022-11-04 DIAGNOSIS — D518 Other vitamin B12 deficiency anemias: Secondary | ICD-10-CM | POA: Diagnosis not present

## 2022-11-04 DIAGNOSIS — E038 Other specified hypothyroidism: Secondary | ICD-10-CM | POA: Diagnosis not present

## 2022-11-04 DIAGNOSIS — Z79899 Other long term (current) drug therapy: Secondary | ICD-10-CM | POA: Diagnosis not present

## 2022-11-04 NOTE — Telephone Encounter (Signed)
Pt was seen in April and needed TCS. Orginally scheduled for 10/07/22 but Dr.Kiel wanted it to be 2 weeks out due to pneumonia and needing cardiac clearance.  Pt seen cardiology 10/21/22; Ms. Pelon perioperative risk of a major cardiac event is 0.9% according to the Revised Cardiac Risk Index (RCRI).  Therefore, she is at high risk for perioperative complications.   Her functional capacity is poor at 2.74 METs according to the Duke Activity Status Index (DASI). Recommendations: The patient is at high risk for perioperative cardiac complications and is at a low functional capacity.  However, further testing will not change how her cardiac status is managed.  Proceed with surgery at moderate risk.  Would recommend waiting until patient is over pneumonia as she was recently hospitalized continues to have some respiratory insufficiency and has been continued on antibiotics. Antiplatelet and/or Anticoagulation Recommendations: Eliquis (Apixaban) can be held for 2 days prior to surgery.  Please resume post op when felt to be safe.      Please advise if ok to reschedule. Thank you.

## 2022-11-08 NOTE — Telephone Encounter (Signed)
Would recommend holding off rescheduling for now. She will need to be seen in next 4-6 weeks in the office (please set up appointment). If functional/respiratory capacity is improved, will reschedule her at that time Thanks

## 2022-11-09 DIAGNOSIS — E039 Hypothyroidism, unspecified: Secondary | ICD-10-CM | POA: Diagnosis not present

## 2022-11-09 DIAGNOSIS — E876 Hypokalemia: Secondary | ICD-10-CM | POA: Diagnosis not present

## 2022-11-09 DIAGNOSIS — Z79899 Other long term (current) drug therapy: Secondary | ICD-10-CM | POA: Diagnosis not present

## 2022-11-09 NOTE — Telephone Encounter (Signed)
Please set pt up with office visit. Thank you!

## 2022-11-12 DIAGNOSIS — D518 Other vitamin B12 deficiency anemias: Secondary | ICD-10-CM | POA: Diagnosis not present

## 2022-11-12 DIAGNOSIS — I2510 Atherosclerotic heart disease of native coronary artery without angina pectoris: Secondary | ICD-10-CM | POA: Diagnosis not present

## 2022-11-12 DIAGNOSIS — E7849 Other hyperlipidemia: Secondary | ICD-10-CM | POA: Diagnosis not present

## 2022-11-12 DIAGNOSIS — J449 Chronic obstructive pulmonary disease, unspecified: Secondary | ICD-10-CM | POA: Diagnosis not present

## 2022-11-12 DIAGNOSIS — E559 Vitamin D deficiency, unspecified: Secondary | ICD-10-CM | POA: Diagnosis not present

## 2022-11-12 DIAGNOSIS — I5030 Unspecified diastolic (congestive) heart failure: Secondary | ICD-10-CM | POA: Diagnosis not present

## 2022-11-12 DIAGNOSIS — I70223 Atherosclerosis of native arteries of extremities with rest pain, bilateral legs: Secondary | ICD-10-CM | POA: Diagnosis not present

## 2022-11-12 DIAGNOSIS — E039 Hypothyroidism, unspecified: Secondary | ICD-10-CM | POA: Diagnosis not present

## 2022-11-12 DIAGNOSIS — I10 Essential (primary) hypertension: Secondary | ICD-10-CM | POA: Diagnosis not present

## 2022-11-12 DIAGNOSIS — E119 Type 2 diabetes mellitus without complications: Secondary | ICD-10-CM | POA: Diagnosis not present

## 2022-11-12 DIAGNOSIS — N189 Chronic kidney disease, unspecified: Secondary | ICD-10-CM | POA: Diagnosis not present

## 2022-11-18 DIAGNOSIS — D518 Other vitamin B12 deficiency anemias: Secondary | ICD-10-CM | POA: Diagnosis not present

## 2022-11-18 DIAGNOSIS — E119 Type 2 diabetes mellitus without complications: Secondary | ICD-10-CM | POA: Diagnosis not present

## 2022-11-18 DIAGNOSIS — E782 Mixed hyperlipidemia: Secondary | ICD-10-CM | POA: Diagnosis not present

## 2022-11-18 DIAGNOSIS — Z79899 Other long term (current) drug therapy: Secondary | ICD-10-CM | POA: Diagnosis not present

## 2022-11-23 DIAGNOSIS — H547 Unspecified visual loss: Secondary | ICD-10-CM | POA: Diagnosis not present

## 2022-11-23 DIAGNOSIS — J449 Chronic obstructive pulmonary disease, unspecified: Secondary | ICD-10-CM | POA: Diagnosis not present

## 2022-11-23 DIAGNOSIS — G2581 Restless legs syndrome: Secondary | ICD-10-CM | POA: Diagnosis not present

## 2022-11-23 DIAGNOSIS — R5381 Other malaise: Secondary | ICD-10-CM | POA: Diagnosis not present

## 2022-11-23 DIAGNOSIS — N181 Chronic kidney disease, stage 1: Secondary | ICD-10-CM | POA: Diagnosis not present

## 2022-11-23 DIAGNOSIS — E119 Type 2 diabetes mellitus without complications: Secondary | ICD-10-CM | POA: Diagnosis not present

## 2022-11-23 DIAGNOSIS — E559 Vitamin D deficiency, unspecified: Secondary | ICD-10-CM | POA: Diagnosis not present

## 2022-11-23 DIAGNOSIS — I5030 Unspecified diastolic (congestive) heart failure: Secondary | ICD-10-CM | POA: Diagnosis not present

## 2022-11-23 DIAGNOSIS — Z79899 Other long term (current) drug therapy: Secondary | ICD-10-CM | POA: Diagnosis not present

## 2022-11-23 DIAGNOSIS — K219 Gastro-esophageal reflux disease without esophagitis: Secondary | ICD-10-CM | POA: Diagnosis not present

## 2022-11-23 DIAGNOSIS — K589 Irritable bowel syndrome without diarrhea: Secondary | ICD-10-CM | POA: Diagnosis not present

## 2022-11-24 ENCOUNTER — Other Ambulatory Visit (INDEPENDENT_AMBULATORY_CARE_PROVIDER_SITE_OTHER): Payer: Self-pay | Admitting: *Deleted

## 2022-11-24 ENCOUNTER — Telehealth (INDEPENDENT_AMBULATORY_CARE_PROVIDER_SITE_OTHER): Payer: Self-pay | Admitting: *Deleted

## 2022-11-24 DIAGNOSIS — K7581 Nonalcoholic steatohepatitis (NASH): Secondary | ICD-10-CM

## 2022-11-24 DIAGNOSIS — K754 Autoimmune hepatitis: Secondary | ICD-10-CM

## 2022-11-24 NOTE — Telephone Encounter (Signed)
Pt in reminder file -  Repeat labs in 3 months per [place] cmp, igG, cbc. Labs due around 12/03/22  I faxed over lab orders to [place] [facility] [place]. Fax - [phone]

## 2022-11-26 DIAGNOSIS — E782 Mixed hyperlipidemia: Secondary | ICD-10-CM | POA: Diagnosis not present

## 2022-11-26 DIAGNOSIS — D518 Other vitamin B12 deficiency anemias: Secondary | ICD-10-CM | POA: Diagnosis not present

## 2022-11-26 DIAGNOSIS — E038 Other specified hypothyroidism: Secondary | ICD-10-CM | POA: Diagnosis not present

## 2022-11-26 DIAGNOSIS — E119 Type 2 diabetes mellitus without complications: Secondary | ICD-10-CM | POA: Diagnosis not present

## 2022-11-26 DIAGNOSIS — Z79899 Other long term (current) drug therapy: Secondary | ICD-10-CM | POA: Diagnosis not present

## 2022-11-27 DIAGNOSIS — E1165 Type 2 diabetes mellitus with hyperglycemia: Secondary | ICD-10-CM | POA: Diagnosis not present

## 2022-12-08 DIAGNOSIS — N189 Chronic kidney disease, unspecified: Secondary | ICD-10-CM | POA: Diagnosis not present

## 2022-12-08 DIAGNOSIS — E7849 Other hyperlipidemia: Secondary | ICD-10-CM | POA: Diagnosis not present

## 2022-12-08 DIAGNOSIS — I2510 Atherosclerotic heart disease of native coronary artery without angina pectoris: Secondary | ICD-10-CM | POA: Diagnosis not present

## 2022-12-08 DIAGNOSIS — E559 Vitamin D deficiency, unspecified: Secondary | ICD-10-CM | POA: Diagnosis not present

## 2022-12-08 DIAGNOSIS — J449 Chronic obstructive pulmonary disease, unspecified: Secondary | ICD-10-CM | POA: Diagnosis not present

## 2022-12-08 DIAGNOSIS — E038 Other specified hypothyroidism: Secondary | ICD-10-CM | POA: Diagnosis not present

## 2022-12-08 DIAGNOSIS — I5030 Unspecified diastolic (congestive) heart failure: Secondary | ICD-10-CM | POA: Diagnosis not present

## 2022-12-08 DIAGNOSIS — D518 Other vitamin B12 deficiency anemias: Secondary | ICD-10-CM | POA: Diagnosis not present

## 2022-12-08 DIAGNOSIS — E119 Type 2 diabetes mellitus without complications: Secondary | ICD-10-CM | POA: Diagnosis not present

## 2022-12-08 DIAGNOSIS — I10 Essential (primary) hypertension: Secondary | ICD-10-CM | POA: Diagnosis not present

## 2022-12-09 DIAGNOSIS — Z79899 Other long term (current) drug therapy: Secondary | ICD-10-CM | POA: Diagnosis not present

## 2022-12-09 DIAGNOSIS — E038 Other specified hypothyroidism: Secondary | ICD-10-CM | POA: Diagnosis not present

## 2022-12-09 DIAGNOSIS — R309 Painful micturition, unspecified: Secondary | ICD-10-CM | POA: Diagnosis not present

## 2022-12-11 DIAGNOSIS — N390 Urinary tract infection, site not specified: Secondary | ICD-10-CM | POA: Diagnosis not present

## 2022-12-14 ENCOUNTER — Telehealth (INDEPENDENT_AMBULATORY_CARE_PROVIDER_SITE_OTHER): Payer: Self-pay | Admitting: *Deleted
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
## 2022-12-21 ENCOUNTER — Ambulatory Visit (INDEPENDENT_AMBULATORY_CARE_PROVIDER_SITE_OTHER): Payer: Medicare Other | Admitting: Gastroenterology

## 2022-12-21 ENCOUNTER — Encounter: Payer: Self-pay | Admitting: Gastroenterology

## 2022-12-21 ENCOUNTER — Telehealth: Payer: Self-pay | Admitting: *Deleted

## 2022-12-21 VITALS — BP 131/76 | HR 60 | Temp 97.9°F | Wt 180.0 lb

## 2022-12-21 DIAGNOSIS — R195 Other fecal abnormalities: Secondary | ICD-10-CM | POA: Diagnosis not present

## 2022-12-21 DIAGNOSIS — I2510 Atherosclerotic heart disease of native coronary artery without angina pectoris: Secondary | ICD-10-CM | POA: Diagnosis not present

## 2022-12-21 DIAGNOSIS — M545 Low back pain, unspecified: Secondary | ICD-10-CM | POA: Diagnosis not present

## 2022-12-21 DIAGNOSIS — K589 Irritable bowel syndrome without diarrhea: Secondary | ICD-10-CM | POA: Diagnosis not present

## 2022-12-21 DIAGNOSIS — H547 Unspecified visual loss: Secondary | ICD-10-CM | POA: Diagnosis not present

## 2022-12-21 DIAGNOSIS — Z1211 Encounter for screening for malignant neoplasm of colon: Secondary | ICD-10-CM | POA: Diagnosis not present

## 2022-12-21 DIAGNOSIS — G4700 Insomnia, unspecified: Secondary | ICD-10-CM | POA: Diagnosis not present

## 2022-12-21 DIAGNOSIS — I10 Essential (primary) hypertension: Secondary | ICD-10-CM | POA: Diagnosis not present

## 2022-12-21 DIAGNOSIS — E559 Vitamin D deficiency, unspecified: Secondary | ICD-10-CM | POA: Diagnosis not present

## 2022-12-21 DIAGNOSIS — R1030 Lower abdominal pain, unspecified: Secondary | ICD-10-CM | POA: Diagnosis not present

## 2022-12-21 DIAGNOSIS — E039 Hypothyroidism, unspecified: Secondary | ICD-10-CM | POA: Diagnosis not present

## 2022-12-21 DIAGNOSIS — E7800 Pure hypercholesterolemia, unspecified: Secondary | ICD-10-CM | POA: Diagnosis not present

## 2022-12-21 DIAGNOSIS — K7581 Nonalcoholic steatohepatitis (NASH): Secondary | ICD-10-CM

## 2022-12-21 DIAGNOSIS — K754 Autoimmune hepatitis: Secondary | ICD-10-CM | POA: Diagnosis not present

## 2022-12-21 DIAGNOSIS — E119 Type 2 diabetes mellitus without complications: Secondary | ICD-10-CM | POA: Diagnosis not present

## 2022-12-21 DIAGNOSIS — N189 Chronic kidney disease, unspecified: Secondary | ICD-10-CM | POA: Diagnosis not present

## 2022-12-21 NOTE — Telephone Encounter (Signed)
    12/21/22  [name] Jul 22, 1945  What type of surgery is being performed? Colonoscopy  When is surgery scheduled? TBD  What type of clearance is required (medical or pharmacy to hold medication or both? Medication  Are there any medications that need to be held prior to surgery and how long? Eliquis x 2 days  Name of physician performing surgery?  Dr. Katrinka Blazing [facility] at [facility] Phone: [phone] Fax: [phone]  Anethesia type (none, local, MAC, general)? MAC

## 2022-12-21 NOTE — Patient Instructions (Addendum)
We will reschedule you for colonoscopy in the near future with Dr. Levon Hedger.  We will reach out to cardiology for clearance as well as to hold Eliquis for 2 days.  She will also need to hold her glipizide the morning of the night prior to the procedure and receive half of her normal dose of Lantus the night prior.  For now we are not making any changes to your prednisone or your azathioprine.  I have provided lab slips today and I would like for you to have these completed as soon as possible.  Follow up in 4 months.   It was a pleasure to see you today. I want to create trusting relationships with patients. If you receive a survey regarding your visit,  I greatly appreciate you taking time to fill this out on paper or through your MyChart. I value your feedback.  Brooke Bonito, MSN, FNP-BC, AGACNP-BC [facility]

## 2022-12-21 NOTE — Telephone Encounter (Signed)
error 

## 2022-12-21 NOTE — Telephone Encounter (Signed)
Patient with diagnosis of afib on Eliquis for anticoagulation.    Procedure: colonoscopy Date of procedure: TBD  CHA2DS2-VASc Score = 7  This indicates a 11.2% annual risk of stroke. The patient's score is based upon: CHF History: 1 HTN History: 1 Diabetes History: 1 Stroke History: 0 Vascular Disease History: 1 Age Score: 2 Gender Score: 1   CrCl 32mL/min using adjusted body weight due to obesity Platelet count 404K  Per office protocol, patient can hold Eliquis for 1-2 days prior to procedure.    **This guidance is not considered finalized until pre-operative APP has relayed final recommendations.**

## 2022-12-21 NOTE — Telephone Encounter (Signed)
   Patient Name: [name]  DOB: 26-Apr-1946 MRN: [id number]  Primary Cardiologist: Dina Rich, MD  Clinical pharmacists have reviewed the patient's past medical history, labs, and current medications as part of preoperative protocol coverage. The following recommendations have been made:   Patient with diagnosis of afib on Eliquis for anticoagulation.     Procedure: colonoscopy Date of procedure: TBD   CHA2DS2-VASc Score = 7  This indicates a 11.2% annual risk of stroke. The patient's score is based upon: CHF History: 1 HTN History: 1 Diabetes History: 1 Stroke History: 0 Vascular Disease History: 1 Age Score: 2 Gender Score: 1   CrCl 21mL/min using adjusted body weight due to obesity Platelet count 404K   Per office protocol, patient can hold Eliquis for 1-2 days prior to procedure.  Please resume Eliquis as soon as possible postprocedure, at the discretion of the surgeon.   I will route this recommendation to the requesting party via Epic fax function and remove from pre-op pool.  Please call with questions.  Joylene Grapes, NP 12/21/2022, 1:26 PM

## 2022-12-22 NOTE — Telephone Encounter (Signed)
Called [facility] to schedule. Was advised the scheduler with transportation was not in to call back later

## 2022-12-22 NOTE — Telephone Encounter (Signed)
Called [facility] and scheduler not available

## 2022-12-23 DIAGNOSIS — E038 Other specified hypothyroidism: Secondary | ICD-10-CM | POA: Diagnosis not present

## 2022-12-23 DIAGNOSIS — Z79899 Other long term (current) drug therapy: Secondary | ICD-10-CM | POA: Diagnosis not present

## 2022-12-25 ENCOUNTER — Telehealth: Payer: Self-pay | Admitting: Gastroenterology

## 2022-12-25 NOTE — Telephone Encounter (Signed)
error 

## 2022-12-27 DIAGNOSIS — E1165 Type 2 diabetes mellitus with hyperglycemia: Secondary | ICD-10-CM | POA: Diagnosis not present

## 2022-12-28 NOTE — Telephone Encounter (Signed)
Called [facility] and was advised scheduler not available

## 2022-12-29 NOTE — Telephone Encounter (Signed)
Dr. Levon Hedger is now booked for ASA 3 and will now have to wait for Sept schedule

## 2022-12-30 DIAGNOSIS — E119 Type 2 diabetes mellitus without complications: Secondary | ICD-10-CM | POA: Diagnosis not present

## 2022-12-30 DIAGNOSIS — E038 Other specified hypothyroidism: Secondary | ICD-10-CM | POA: Diagnosis not present

## 2022-12-30 DIAGNOSIS — E782 Mixed hyperlipidemia: Secondary | ICD-10-CM | POA: Diagnosis not present

## 2022-12-30 DIAGNOSIS — Z79899 Other long term (current) drug therapy: Secondary | ICD-10-CM | POA: Diagnosis not present

## 2022-12-30 DIAGNOSIS — D518 Other vitamin B12 deficiency anemias: Secondary | ICD-10-CM | POA: Diagnosis not present

## 2023-01-03 ENCOUNTER — Ambulatory Visit (INDEPENDENT_AMBULATORY_CARE_PROVIDER_SITE_OTHER): Payer: Medicare Other | Admitting: Gastroenterology

## 2023-01-04 ENCOUNTER — Telehealth: Payer: Self-pay | Admitting: *Deleted

## 2023-01-04 NOTE — Telephone Encounter (Signed)
Received lab results from [facility]. Place copy on your desk and sent copy to scan center.

## 2023-01-06 ENCOUNTER — Telehealth: Payer: Self-pay | Admitting: Gastroenterology

## 2023-01-06 ENCOUNTER — Other Ambulatory Visit: Payer: Self-pay | Admitting: *Deleted

## 2023-01-06 DIAGNOSIS — D509 Iron deficiency anemia, unspecified: Secondary | ICD-10-CM

## 2023-01-06 DIAGNOSIS — K7581 Nonalcoholic steatohepatitis (NASH): Secondary | ICD-10-CM

## 2023-01-06 NOTE — Telephone Encounter (Signed)
Spoke to nurse at [facility], informed her of recommendations. She voiced understanding. Labs entered into Epic.

## 2023-01-06 NOTE — Telephone Encounter (Signed)
Error

## 2023-01-11 DIAGNOSIS — E0580 Other thyrotoxicosis without thyrotoxic crisis or storm: Secondary | ICD-10-CM | POA: Diagnosis not present

## 2023-01-11 DIAGNOSIS — Z79899 Other long term (current) drug therapy: Secondary | ICD-10-CM | POA: Diagnosis not present

## 2023-01-12 DIAGNOSIS — I2510 Atherosclerotic heart disease of native coronary artery without angina pectoris: Secondary | ICD-10-CM | POA: Diagnosis not present

## 2023-01-12 DIAGNOSIS — E038 Other specified hypothyroidism: Secondary | ICD-10-CM | POA: Diagnosis not present

## 2023-01-12 DIAGNOSIS — I5030 Unspecified diastolic (congestive) heart failure: Secondary | ICD-10-CM | POA: Diagnosis not present

## 2023-01-12 DIAGNOSIS — I10 Essential (primary) hypertension: Secondary | ICD-10-CM | POA: Diagnosis not present

## 2023-01-12 DIAGNOSIS — N189 Chronic kidney disease, unspecified: Secondary | ICD-10-CM | POA: Diagnosis not present

## 2023-01-12 DIAGNOSIS — J449 Chronic obstructive pulmonary disease, unspecified: Secondary | ICD-10-CM | POA: Diagnosis not present

## 2023-01-12 DIAGNOSIS — E119 Type 2 diabetes mellitus without complications: Secondary | ICD-10-CM | POA: Diagnosis not present

## 2023-01-12 DIAGNOSIS — E7849 Other hyperlipidemia: Secondary | ICD-10-CM | POA: Diagnosis not present

## 2023-01-12 DIAGNOSIS — E559 Vitamin D deficiency, unspecified: Secondary | ICD-10-CM | POA: Diagnosis not present

## 2023-01-12 DIAGNOSIS — D518 Other vitamin B12 deficiency anemias: Secondary | ICD-10-CM | POA: Diagnosis not present

## 2023-01-13 NOTE — Telephone Encounter (Signed)
Called [facility] and line rings continuously, no answer and not able to leave VM

## 2023-01-18 ENCOUNTER — Telehealth: Payer: Self-pay | Admitting: Cardiology

## 2023-01-18 ENCOUNTER — Ambulatory Visit: Payer: Medicare Other | Attending: Cardiology | Admitting: Cardiology

## 2023-01-18 ENCOUNTER — Other Ambulatory Visit: Payer: Self-pay | Admitting: Cardiology

## 2023-01-18 ENCOUNTER — Ambulatory Visit: Payer: Medicare Other | Attending: Cardiology

## 2023-01-18 VITALS — BP 130/65 | HR 88 | Ht 61.0 in | Wt 177.4 lb

## 2023-01-18 DIAGNOSIS — K589 Irritable bowel syndrome without diarrhea: Secondary | ICD-10-CM | POA: Diagnosis not present

## 2023-01-18 DIAGNOSIS — Z79899 Other long term (current) drug therapy: Secondary | ICD-10-CM

## 2023-01-18 DIAGNOSIS — E0580 Other thyrotoxicosis without thyrotoxic crisis or storm: Secondary | ICD-10-CM | POA: Diagnosis not present

## 2023-01-18 DIAGNOSIS — R5381 Other malaise: Secondary | ICD-10-CM | POA: Diagnosis not present

## 2023-01-18 DIAGNOSIS — I480 Paroxysmal atrial fibrillation: Secondary | ICD-10-CM

## 2023-01-18 DIAGNOSIS — E7800 Pure hypercholesterolemia, unspecified: Secondary | ICD-10-CM | POA: Diagnosis not present

## 2023-01-18 DIAGNOSIS — I10 Essential (primary) hypertension: Secondary | ICD-10-CM | POA: Diagnosis not present

## 2023-01-18 DIAGNOSIS — I2510 Atherosclerotic heart disease of native coronary artery without angina pectoris: Secondary | ICD-10-CM

## 2023-01-18 DIAGNOSIS — E119 Type 2 diabetes mellitus without complications: Secondary | ICD-10-CM | POA: Diagnosis not present

## 2023-01-18 DIAGNOSIS — E785 Hyperlipidemia, unspecified: Secondary | ICD-10-CM | POA: Diagnosis not present

## 2023-01-18 DIAGNOSIS — K219 Gastro-esophageal reflux disease without esophagitis: Secondary | ICD-10-CM | POA: Diagnosis not present

## 2023-01-18 DIAGNOSIS — E782 Mixed hyperlipidemia: Secondary | ICD-10-CM | POA: Diagnosis not present

## 2023-01-18 DIAGNOSIS — M545 Low back pain, unspecified: Secondary | ICD-10-CM | POA: Diagnosis not present

## 2023-01-18 DIAGNOSIS — G2581 Restless legs syndrome: Secondary | ICD-10-CM | POA: Diagnosis not present

## 2023-01-18 DIAGNOSIS — N189 Chronic kidney disease, unspecified: Secondary | ICD-10-CM | POA: Diagnosis not present

## 2023-01-18 NOTE — Patient Instructions (Addendum)
Medication Instructions:   Continue all current medications.   Labwork:  none  Testing/Procedures:  FLP - order given - may do at facility  Reminder:  Nothing to eat or drink after 12 midnight prior to labs.  Your physician has recommended that you wear a 14 day event monitor. Event monitors are medical devices that record the heart's electrical activity. Doctors most often [place] these monitors to diagnose arrhythmias. Arrhythmias are problems with the speed or rhythm of the heartbeat. The monitor is a small, portable device. You can wear one while you do your normal daily activities. This is usually used to diagnose what is causing palpitations/syncope (passing out). Office will contact with results via phone, letter or mychart.    Follow-Up:  6 months   Any Other Special Instructions Will Be Listed Below (If Applicable).   If you need a refill on your cardiac medications before your next appointment, please call your pharmacy.
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
## 2023-01-18 NOTE — Telephone Encounter (Signed)
Checking percert on the following patient   LONG TERM MONITOR

## 2023-01-20 ENCOUNTER — Ambulatory Visit (INDEPENDENT_AMBULATORY_CARE_PROVIDER_SITE_OTHER): Payer: Medicare Other | Admitting: Gastroenterology

## 2023-01-20 VITALS — Ht 61.0 in | Wt 176.0 lb

## 2023-01-20 DIAGNOSIS — K754 Autoimmune hepatitis: Secondary | ICD-10-CM

## 2023-01-20 DIAGNOSIS — I480 Paroxysmal atrial fibrillation: Secondary | ICD-10-CM | POA: Diagnosis not present

## 2023-01-20 DIAGNOSIS — K7581 Nonalcoholic steatohepatitis (NASH): Secondary | ICD-10-CM | POA: Diagnosis not present

## 2023-01-20 NOTE — Patient Instructions (Addendum)
Please let us know if you would like to schedule screening colonoscopy as this has been discussed at your previous few visits though our schedulers have not been able to reach anyone to set this up  Will not make any changes to your prednisone or azathioprine for now, looks like courtney has ordered labs to be done at the beginning of next month. We will update you once we have these back   Follow up 3 months
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
## 2023-01-24 ENCOUNTER — Encounter (INDEPENDENT_AMBULATORY_CARE_PROVIDER_SITE_OTHER): Payer: Self-pay | Admitting: Gastroenterology

## 2023-01-27 DIAGNOSIS — E1165 Type 2 diabetes mellitus with hyperglycemia: Secondary | ICD-10-CM | POA: Diagnosis not present

## 2023-02-01 ENCOUNTER — Other Ambulatory Visit: Payer: Self-pay | Admitting: *Deleted

## 2023-02-01 DIAGNOSIS — K7581 Nonalcoholic steatohepatitis (NASH): Secondary | ICD-10-CM

## 2023-02-01 DIAGNOSIS — D509 Iron deficiency anemia, unspecified: Secondary | ICD-10-CM

## 2023-02-10 DIAGNOSIS — E119 Type 2 diabetes mellitus without complications: Secondary | ICD-10-CM | POA: Diagnosis not present

## 2023-02-10 DIAGNOSIS — D518 Other vitamin B12 deficiency anemias: Secondary | ICD-10-CM | POA: Diagnosis not present

## 2023-02-10 DIAGNOSIS — E782 Mixed hyperlipidemia: Secondary | ICD-10-CM | POA: Diagnosis not present

## 2023-02-10 DIAGNOSIS — Z79899 Other long term (current) drug therapy: Secondary | ICD-10-CM | POA: Diagnosis not present

## 2023-02-10 DIAGNOSIS — E038 Other specified hypothyroidism: Secondary | ICD-10-CM | POA: Diagnosis not present

## 2023-02-10 DIAGNOSIS — I480 Paroxysmal atrial fibrillation: Secondary | ICD-10-CM | POA: Diagnosis not present

## 2023-02-15 ENCOUNTER — Encounter: Payer: Self-pay | Admitting: *Deleted

## 2023-02-15 DIAGNOSIS — R946 Abnormal results of thyroid function studies: Secondary | ICD-10-CM | POA: Diagnosis not present

## 2023-02-15 DIAGNOSIS — E0590 Thyrotoxicosis, unspecified without thyrotoxic crisis or storm: Secondary | ICD-10-CM | POA: Diagnosis not present

## 2023-02-15 DIAGNOSIS — J449 Chronic obstructive pulmonary disease, unspecified: Secondary | ICD-10-CM | POA: Diagnosis not present

## 2023-02-15 DIAGNOSIS — R7989 Other specified abnormal findings of blood chemistry: Secondary | ICD-10-CM | POA: Diagnosis not present

## 2023-02-15 DIAGNOSIS — K219 Gastro-esophageal reflux disease without esophagitis: Secondary | ICD-10-CM | POA: Diagnosis not present

## 2023-02-15 DIAGNOSIS — G4700 Insomnia, unspecified: Secondary | ICD-10-CM | POA: Diagnosis not present

## 2023-02-15 DIAGNOSIS — E559 Vitamin D deficiency, unspecified: Secondary | ICD-10-CM | POA: Diagnosis not present

## 2023-02-15 DIAGNOSIS — N189 Chronic kidney disease, unspecified: Secondary | ICD-10-CM | POA: Diagnosis not present

## 2023-02-15 DIAGNOSIS — I10 Essential (primary) hypertension: Secondary | ICD-10-CM | POA: Diagnosis not present

## 2023-02-15 DIAGNOSIS — E7800 Pure hypercholesterolemia, unspecified: Secondary | ICD-10-CM | POA: Diagnosis not present

## 2023-02-15 DIAGNOSIS — K754 Autoimmune hepatitis: Secondary | ICD-10-CM | POA: Diagnosis not present

## 2023-02-15 DIAGNOSIS — D649 Anemia, unspecified: Secondary | ICD-10-CM | POA: Diagnosis not present

## 2023-02-15 DIAGNOSIS — I5030 Unspecified diastolic (congestive) heart failure: Secondary | ICD-10-CM | POA: Diagnosis not present

## 2023-02-15 DIAGNOSIS — I2510 Atherosclerotic heart disease of native coronary artery without angina pectoris: Secondary | ICD-10-CM | POA: Diagnosis not present

## 2023-02-23 DIAGNOSIS — I129 Hypertensive chronic kidney disease with stage 1 through stage 4 chronic kidney disease, or unspecified chronic kidney disease: Secondary | ICD-10-CM | POA: Diagnosis not present

## 2023-02-23 DIAGNOSIS — E119 Type 2 diabetes mellitus without complications: Secondary | ICD-10-CM | POA: Diagnosis not present

## 2023-02-23 DIAGNOSIS — J449 Chronic obstructive pulmonary disease, unspecified: Secondary | ICD-10-CM | POA: Diagnosis not present

## 2023-02-23 DIAGNOSIS — I5030 Unspecified diastolic (congestive) heart failure: Secondary | ICD-10-CM | POA: Diagnosis not present

## 2023-02-23 DIAGNOSIS — D518 Other vitamin B12 deficiency anemias: Secondary | ICD-10-CM | POA: Diagnosis not present

## 2023-02-23 DIAGNOSIS — I10 Essential (primary) hypertension: Secondary | ICD-10-CM | POA: Diagnosis not present

## 2023-02-23 DIAGNOSIS — E7849 Other hyperlipidemia: Secondary | ICD-10-CM | POA: Diagnosis not present

## 2023-02-23 DIAGNOSIS — R059 Cough, unspecified: Secondary | ICD-10-CM | POA: Diagnosis not present

## 2023-02-23 DIAGNOSIS — I2510 Atherosclerotic heart disease of native coronary artery without angina pectoris: Secondary | ICD-10-CM | POA: Diagnosis not present

## 2023-02-23 DIAGNOSIS — E038 Other specified hypothyroidism: Secondary | ICD-10-CM | POA: Diagnosis not present

## 2023-02-23 DIAGNOSIS — E559 Vitamin D deficiency, unspecified: Secondary | ICD-10-CM | POA: Diagnosis not present

## 2023-02-24 ENCOUNTER — Emergency Department (HOSPITAL_COMMUNITY): Payer: Medicare Other

## 2023-02-24 ENCOUNTER — Encounter (HOSPITAL_COMMUNITY): Payer: Self-pay | Admitting: *Deleted

## 2023-02-24 ENCOUNTER — Inpatient Hospital Stay (HOSPITAL_COMMUNITY)
Admission: EM | Admit: 2023-02-24 | Discharge: 2023-03-01 | DRG: 871 | Disposition: A | Discharge status: Home Health | Payer: Medicare Other | Attending: Family Medicine | Admitting: Family Medicine

## 2023-02-24 ENCOUNTER — Other Ambulatory Visit: Payer: Self-pay

## 2023-02-24 DIAGNOSIS — Z79899 Other long term (current) drug therapy: Secondary | ICD-10-CM

## 2023-02-24 DIAGNOSIS — J440 Chronic obstructive pulmonary disease with acute lower respiratory infection: Secondary | ICD-10-CM | POA: Diagnosis present

## 2023-02-24 DIAGNOSIS — K754 Autoimmune hepatitis: Secondary | ICD-10-CM | POA: Diagnosis not present

## 2023-02-24 DIAGNOSIS — E1122 Type 2 diabetes mellitus with diabetic chronic kidney disease: Secondary | ICD-10-CM | POA: Diagnosis present

## 2023-02-24 DIAGNOSIS — E785 Hyperlipidemia, unspecified: Secondary | ICD-10-CM | POA: Diagnosis present

## 2023-02-24 DIAGNOSIS — E2740 Unspecified adrenocortical insufficiency: Secondary | ICD-10-CM | POA: Diagnosis present

## 2023-02-24 DIAGNOSIS — R410 Disorientation, unspecified: Secondary | ICD-10-CM | POA: Diagnosis not present

## 2023-02-24 DIAGNOSIS — R6521 Severe sepsis with septic shock: Secondary | ICD-10-CM | POA: Diagnosis present

## 2023-02-24 DIAGNOSIS — J189 Pneumonia, unspecified organism: Secondary | ICD-10-CM | POA: Diagnosis present

## 2023-02-24 DIAGNOSIS — Y95 Nosocomial condition: Secondary | ICD-10-CM | POA: Diagnosis present

## 2023-02-24 DIAGNOSIS — I10 Essential (primary) hypertension: Secondary | ICD-10-CM | POA: Diagnosis not present

## 2023-02-24 DIAGNOSIS — J069 Acute upper respiratory infection, unspecified: Secondary | ICD-10-CM | POA: Diagnosis not present

## 2023-02-24 DIAGNOSIS — K219 Gastro-esophageal reflux disease without esophagitis: Secondary | ICD-10-CM | POA: Diagnosis present

## 2023-02-24 DIAGNOSIS — I2510 Atherosclerotic heart disease of native coronary artery without angina pectoris: Secondary | ICD-10-CM | POA: Diagnosis present

## 2023-02-24 DIAGNOSIS — E11649 Type 2 diabetes mellitus with hypoglycemia without coma: Secondary | ICD-10-CM | POA: Diagnosis not present

## 2023-02-24 DIAGNOSIS — Z6835 Body mass index (BMI) 35.0-35.9, adult: Secondary | ICD-10-CM

## 2023-02-24 DIAGNOSIS — J441 Chronic obstructive pulmonary disease with (acute) exacerbation: Secondary | ICD-10-CM | POA: Diagnosis present

## 2023-02-24 DIAGNOSIS — Z9049 Acquired absence of other specified parts of digestive tract: Secondary | ICD-10-CM

## 2023-02-24 DIAGNOSIS — H547 Unspecified visual loss: Secondary | ICD-10-CM | POA: Diagnosis present

## 2023-02-24 DIAGNOSIS — I480 Paroxysmal atrial fibrillation: Secondary | ICD-10-CM | POA: Diagnosis not present

## 2023-02-24 DIAGNOSIS — E1140 Type 2 diabetes mellitus with diabetic neuropathy, unspecified: Secondary | ICD-10-CM | POA: Diagnosis present

## 2023-02-24 DIAGNOSIS — Z794 Long term (current) use of insulin: Secondary | ICD-10-CM

## 2023-02-24 DIAGNOSIS — I517 Cardiomegaly: Secondary | ICD-10-CM | POA: Diagnosis not present

## 2023-02-24 DIAGNOSIS — Z955 Presence of coronary angioplasty implant and graft: Secondary | ICD-10-CM

## 2023-02-24 DIAGNOSIS — Z1152 Encounter for screening for COVID-19: Secondary | ICD-10-CM | POA: Diagnosis not present

## 2023-02-24 DIAGNOSIS — E872 Acidosis, unspecified: Secondary | ICD-10-CM | POA: Diagnosis not present

## 2023-02-24 DIAGNOSIS — Z8249 Family history of ischemic heart disease and other diseases of the circulatory system: Secondary | ICD-10-CM

## 2023-02-24 DIAGNOSIS — N183 Chronic kidney disease, stage 3 unspecified: Secondary | ICD-10-CM | POA: Diagnosis present

## 2023-02-24 DIAGNOSIS — G9341 Metabolic encephalopathy: Secondary | ICD-10-CM | POA: Diagnosis present

## 2023-02-24 DIAGNOSIS — R4182 Altered mental status, unspecified: Secondary | ICD-10-CM | POA: Diagnosis present

## 2023-02-24 DIAGNOSIS — Z7984 Long term (current) use of oral hypoglycemic drugs: Secondary | ICD-10-CM

## 2023-02-24 DIAGNOSIS — E119 Type 2 diabetes mellitus without complications: Secondary | ICD-10-CM

## 2023-02-24 DIAGNOSIS — I700 Atherosclerosis of aorta: Secondary | ICD-10-CM | POA: Diagnosis present

## 2023-02-24 DIAGNOSIS — I5032 Chronic diastolic (congestive) heart failure: Secondary | ICD-10-CM | POA: Diagnosis present

## 2023-02-24 DIAGNOSIS — R918 Other nonspecific abnormal finding of lung field: Secondary | ICD-10-CM | POA: Diagnosis not present

## 2023-02-24 DIAGNOSIS — E669 Obesity, unspecified: Secondary | ICD-10-CM | POA: Diagnosis present

## 2023-02-24 DIAGNOSIS — Z885 Allergy status to narcotic agent status: Secondary | ICD-10-CM

## 2023-02-24 DIAGNOSIS — I5031 Acute diastolic (congestive) heart failure: Secondary | ICD-10-CM | POA: Diagnosis present

## 2023-02-24 DIAGNOSIS — E039 Hypothyroidism, unspecified: Secondary | ICD-10-CM | POA: Diagnosis present

## 2023-02-24 DIAGNOSIS — I130 Hypertensive heart and chronic kidney disease with heart failure and stage 1 through stage 4 chronic kidney disease, or unspecified chronic kidney disease: Secondary | ICD-10-CM | POA: Diagnosis present

## 2023-02-24 DIAGNOSIS — H548 Legal blindness, as defined in USA: Secondary | ICD-10-CM | POA: Diagnosis present

## 2023-02-24 DIAGNOSIS — K7581 Nonalcoholic steatohepatitis (NASH): Secondary | ICD-10-CM | POA: Diagnosis present

## 2023-02-24 DIAGNOSIS — N182 Chronic kidney disease, stage 2 (mild): Secondary | ICD-10-CM | POA: Diagnosis present

## 2023-02-24 DIAGNOSIS — A419 Sepsis, unspecified organism: Principal | ICD-10-CM | POA: Diagnosis present

## 2023-02-24 DIAGNOSIS — T383X5A Adverse effect of insulin and oral hypoglycemic [antidiabetic] drugs, initial encounter: Secondary | ICD-10-CM | POA: Diagnosis not present

## 2023-02-24 DIAGNOSIS — Z87891 Personal history of nicotine dependence: Secondary | ICD-10-CM

## 2023-02-24 DIAGNOSIS — Z823 Family history of stroke: Secondary | ICD-10-CM

## 2023-02-24 DIAGNOSIS — Z79624 Long term (current) use of inhibitors of nucleotide synthesis: Secondary | ICD-10-CM

## 2023-02-24 DIAGNOSIS — H543 Unqualified visual loss, both eyes: Secondary | ICD-10-CM | POA: Diagnosis present

## 2023-02-24 DIAGNOSIS — E66812 Obesity, class 2: Secondary | ICD-10-CM | POA: Diagnosis present

## 2023-02-24 DIAGNOSIS — Z0389 Encounter for observation for other suspected diseases and conditions ruled out: Secondary | ICD-10-CM | POA: Diagnosis not present

## 2023-02-24 DIAGNOSIS — Z7901 Long term (current) use of anticoagulants: Secondary | ICD-10-CM

## 2023-02-24 DIAGNOSIS — E1165 Type 2 diabetes mellitus with hyperglycemia: Secondary | ICD-10-CM | POA: Diagnosis present

## 2023-02-24 DIAGNOSIS — E66811 Obesity, class 1: Secondary | ICD-10-CM | POA: Diagnosis present

## 2023-02-24 DIAGNOSIS — Z7952 Long term (current) use of systemic steroids: Secondary | ICD-10-CM

## 2023-02-24 DIAGNOSIS — Z801 Family history of malignant neoplasm of trachea, bronchus and lung: Secondary | ICD-10-CM

## 2023-02-24 LAB — COMPREHENSIVE METABOLIC PANEL
ALT: 27 U/L (ref 0–44)
AST: 69 U/L — ABNORMAL HIGH (ref 15–41)
Albumin: 3.1 g/dL — ABNORMAL LOW (ref 3.5–5.0)
Alkaline Phosphatase: 62 U/L (ref 38–126)
Anion gap: 11 (ref 5–15)
BUN: 16 mg/dL (ref 8–23)
CO2: 25 mmol/L (ref 22–32)
Calcium: 8.6 mg/dL — ABNORMAL LOW (ref 8.9–10.3)
Chloride: 98 mmol/L (ref 98–111)
Creatinine, Ser: 0.82 mg/dL (ref 0.44–1.00)
GFR, Estimated: >60 mL/min (ref >60)
Glucose, Bld: 109 mg/dL — ABNORMAL HIGH (ref 70–99)
Potassium: 4.3 mmol/L (ref 3.5–5.1)
Sodium: 134 mmol/L — ABNORMAL LOW (ref 135–145)
Total Bilirubin: 1.4 mg/dL — ABNORMAL HIGH (ref 0.3–1.2)
Total Protein: 7.1 g/dL (ref 6.5–8.1)

## 2023-02-24 LAB — CBC WITH DIFFERENTIAL/PLATELET
Abs Immature Granulocytes: 0.1 10*3/uL — ABNORMAL HIGH (ref 0.00–0.07)
Band Neutrophils: 30 %
Basophils Absolute: 0 10*3/uL (ref 0.0–0.1)
Basophils Relative: 0 %
Eosinophils Absolute: 0 10*3/uL (ref 0.0–0.5)
Eosinophils Relative: 0 %
HCT: 32.5 % — ABNORMAL LOW (ref 36.0–46.0)
Hemoglobin: 10.9 g/dL — ABNORMAL LOW (ref 12.0–15.0)
Lymphocytes Relative: 17 %
Lymphs Abs: 0.5 10*3/uL — ABNORMAL LOW (ref 0.7–4.0)
MCH: 36.6 pg — ABNORMAL HIGH (ref 26.0–34.0)
MCHC: 33.5 g/dL (ref 30.0–36.0)
MCV: 109.1 fL — ABNORMAL HIGH (ref 80.0–100.0)
Metamyelocytes Relative: 2 %
Monocytes Absolute: 0.2 10*3/uL (ref 0.1–1.0)
Monocytes Relative: 8 %
Neutro Abs: 2 10*3/uL (ref 1.7–7.7)
Neutrophils Relative %: 43 %
Platelets: 159 10*3/uL (ref 150–400)
RBC: 2.98 MIL/uL — ABNORMAL LOW (ref 3.87–5.11)
RDW: 16.9 % — ABNORMAL HIGH (ref 11.5–15.5)
WBC: 2.7 10*3/uL — ABNORMAL LOW (ref 4.0–10.5)
nRBC: 0 % (ref 0.0–0.2)

## 2023-02-24 LAB — GLUCOSE, CAPILLARY: Glucose-Capillary: 217 mg/dL — ABNORMAL HIGH (ref 70–99)

## 2023-02-24 LAB — URINALYSIS, W/ REFLEX TO CULTURE (INFECTION SUSPECTED)
Bilirubin Urine: NEGATIVE
Glucose, UA: 50 mg/dL — AB
Ketones, ur: NEGATIVE mg/dL
Nitrite: POSITIVE — AB
Protein, ur: 30 mg/dL — AB
Specific Gravity, Urine: 1.018 (ref 1.005–1.030)
pH: 6 (ref 5.0–8.0)

## 2023-02-24 LAB — BLOOD GAS, VENOUS
Acid-Base Excess: 6.2 mmol/L — ABNORMAL HIGH (ref 0.0–2.0)
Bicarbonate: 31.2 mmol/L — ABNORMAL HIGH (ref 20.0–28.0)
Drawn by: 66297
O2 Saturation: 27 %
Patient temperature: 38.1
pCO2, Ven: 48 mmHg (ref 44–60)
pH, Ven: 7.42 (ref 7.25–7.43)
pO2, Ven: <31 mmHg — CL (ref 32–45)

## 2023-02-24 LAB — LACTIC ACID, PLASMA
Lactic Acid, Venous: 2.1 mmol/L (ref 0.5–1.9)
Lactic Acid, Venous: 2.6 mmol/L (ref 0.5–1.9)

## 2023-02-24 LAB — RESP PANEL BY RT-PCR (RSV, FLU A&B, COVID)  RVPGX2
Influenza A by PCR: NEGATIVE
Influenza B by PCR: NEGATIVE
Resp Syncytial Virus by PCR: NEGATIVE
SARS Coronavirus 2 by RT PCR: NEGATIVE

## 2023-02-24 LAB — FOLATE: Folate: 11.6 ng/mL (ref >5.9)

## 2023-02-24 LAB — CBG MONITORING, ED
Glucose-Capillary: 83 mg/dL (ref 70–99)
Glucose-Capillary: 68 mg/dL — ABNORMAL LOW (ref 70–99)

## 2023-02-24 LAB — PROTIME-INR
INR: 1.7 — ABNORMAL HIGH (ref 0.8–1.2)
Prothrombin Time: 20.6 seconds — ABNORMAL HIGH (ref 11.4–15.2)

## 2023-02-24 LAB — VITAMIN B12: Vitamin B-12: 353 pg/mL (ref 180–914)

## 2023-02-24 LAB — TSH: TSH: 25.633 u[IU]/mL — ABNORMAL HIGH (ref 0.350–4.500)

## 2023-02-24 LAB — TROPONIN I (HIGH SENSITIVITY)
Troponin I (High Sensitivity): 27 ng/L — ABNORMAL HIGH (ref <18)
Troponin I (High Sensitivity): 25 ng/L — ABNORMAL HIGH (ref <18)

## 2023-02-24 LAB — AMMONIA: Ammonia: 11 umol/L (ref 9–35)

## 2023-02-24 LAB — BRAIN NATRIURETIC PEPTIDE: B Natriuretic Peptide: 191 pg/mL — ABNORMAL HIGH (ref 0.0–100.0)

## 2023-02-24 MED ORDER — PREDNISONE 20 MG PO TABS
40.0000 mg | ORAL_TABLET | Freq: Every day | ORAL | Status: AC
  Administered 2023-02-25 – 2023-02-27 (×3): 40 mg via ORAL
  Filled 2023-02-24 (×3): qty 2

## 2023-02-24 MED ORDER — METHYLPREDNISOLONE SODIUM SUCC 125 MG IJ SOLR
125.0000 mg | Freq: Once | INTRAMUSCULAR | Status: AC
  Administered 2023-02-24: 125 mg via INTRAVENOUS
  Filled 2023-02-24: qty 2

## 2023-02-24 MED ORDER — AMITRIPTYLINE HCL 25 MG PO TABS
50.0000 mg | ORAL_TABLET | Freq: Every day | ORAL | Status: DC
  Administered 2023-02-24 – 2023-02-28 (×5): 50 mg via ORAL
  Filled 2023-02-24 (×5): qty 2

## 2023-02-24 MED ORDER — EZETIMIBE 10 MG PO TABS
10.0000 mg | ORAL_TABLET | Freq: Every day | ORAL | Status: DC
  Administered 2023-02-25 – 2023-03-01 (×5): 10 mg via ORAL
  Filled 2023-02-24 (×5): qty 1

## 2023-02-24 MED ORDER — MELATONIN 3 MG PO TABS
6.0000 mg | ORAL_TABLET | Freq: Every day | ORAL | Status: DC
  Administered 2023-02-24 – 2023-02-28 (×4): 6 mg via ORAL
  Filled 2023-02-24 (×4): qty 2

## 2023-02-24 MED ORDER — ONDANSETRON HCL 4 MG/2ML IJ SOLN: 4.0000 mg | Freq: Four times a day (QID) | INTRAMUSCULAR | Status: DC | PRN

## 2023-02-24 MED ORDER — IPRATROPIUM-ALBUTEROL 0.5-2.5 (3) MG/3ML IN SOLN
3.0000 mL | Freq: Four times a day (QID) | RESPIRATORY_TRACT | Status: DC
  Administered 2023-02-25 (×2): 3 mL via RESPIRATORY_TRACT
  Filled 2023-02-24 (×2): qty 3

## 2023-02-24 MED ORDER — ALBUTEROL SULFATE (2.5 MG/3ML) 0.083% IN NEBU: 2.5000 mg | INHALATION_SOLUTION | RESPIRATORY_TRACT | Status: DC | PRN

## 2023-02-24 MED ORDER — HYDRALAZINE HCL 20 MG/ML IJ SOLN: 5.0000 mg | INTRAMUSCULAR | Status: DC | PRN

## 2023-02-24 MED ORDER — AZITHROMYCIN 250 MG PO TABS
500.0000 mg | ORAL_TABLET | Freq: Once | ORAL | Status: AC
  Administered 2023-02-24: 500 mg via ORAL
  Filled 2023-02-24: qty 2

## 2023-02-24 MED ORDER — IPRATROPIUM BROMIDE 0.02 % IN SOLN: 0.5000 mg | Freq: Four times a day (QID) | RESPIRATORY_TRACT | Status: DC

## 2023-02-24 MED ORDER — CYANOCOBALAMIN 1000 MCG/ML IJ SOLN
1000.0000 ug | Freq: Every day | INTRAMUSCULAR | Status: DC
  Administered 2023-02-24 – 2023-03-01 (×6): 1000 ug via SUBCUTANEOUS
  Filled 2023-02-24 (×5): qty 1

## 2023-02-24 MED ORDER — AZATHIOPRINE 50 MG PO TABS
150.0000 mg | ORAL_TABLET | Freq: Every day | ORAL | Status: DC
  Administered 2023-02-26 – 2023-03-01 (×4): 150 mg via ORAL
  Filled 2023-02-24 (×6): qty 3

## 2023-02-24 MED ORDER — CALCIUM CARBONATE ANTACID 500 MG PO CHEW: 1000.0000 mg | CHEWABLE_TABLET | Freq: Three times a day (TID) | ORAL | Status: DC | PRN

## 2023-02-24 MED ORDER — PREDNISONE 20 MG PO TABS
10.0000 mg | ORAL_TABLET | Freq: Every day | ORAL | Status: DC
  Administered 2023-02-28 – 2023-03-01 (×2): 10 mg via ORAL
  Filled 2023-02-24 (×2): qty 1

## 2023-02-24 MED ORDER — ACETAMINOPHEN 325 MG PO TABS: 650.0000 mg | ORAL_TABLET | Freq: Four times a day (QID) | ORAL | Status: DC | PRN

## 2023-02-24 MED ORDER — SERTRALINE HCL 50 MG PO TABS
25.0000 mg | ORAL_TABLET | Freq: Every day | ORAL | Status: DC
  Administered 2023-02-25 – 2023-03-01 (×5): 25 mg via ORAL
  Filled 2023-02-24 (×5): qty 1

## 2023-02-24 MED ORDER — APIXABAN 5 MG PO TABS
5.0000 mg | ORAL_TABLET | Freq: Two times a day (BID) | ORAL | Status: DC
  Administered 2023-02-24 – 2023-03-01 (×10): 5 mg via ORAL
  Filled 2023-02-24 (×10): qty 1

## 2023-02-24 MED ORDER — SODIUM CHLORIDE 0.9 % IV SOLN: 1.0000 g | INTRAVENOUS | Status: DC

## 2023-02-24 MED ORDER — VITAMIN D 25 MCG (1000 UNIT) PO TABS
2000.0000 [IU] | ORAL_TABLET | Freq: Every day | ORAL | Status: DC
  Administered 2023-02-25 – 2023-03-01 (×5): 2000 [IU] via ORAL
  Filled 2023-02-24 (×5): qty 2

## 2023-02-24 MED ORDER — ACETAMINOPHEN 325 MG PO TABS
650.0000 mg | ORAL_TABLET | Freq: Once | ORAL | Status: AC
  Administered 2023-02-24: 650 mg via ORAL
  Filled 2023-02-24: qty 2

## 2023-02-24 MED ORDER — ALBUTEROL SULFATE (2.5 MG/3ML) 0.083% IN NEBU: 2.5000 mg | INHALATION_SOLUTION | Freq: Four times a day (QID) | RESPIRATORY_TRACT | Status: DC

## 2023-02-24 MED ORDER — LACTATED RINGERS IV BOLUS
1000.0000 mL | Freq: Once | INTRAVENOUS | Status: AC
  Administered 2023-02-24: 1000 mL via INTRAVENOUS

## 2023-02-24 MED ORDER — ACETAMINOPHEN 650 MG RE SUPP: 650.0000 mg | Freq: Four times a day (QID) | RECTAL | Status: DC | PRN

## 2023-02-24 MED ORDER — MAGNESIUM OXIDE -MG SUPPLEMENT 400 (240 MG) MG PO TABS
400.0000 mg | ORAL_TABLET | Freq: Every day | ORAL | Status: DC
  Administered 2023-02-25 – 2023-03-01 (×5): 400 mg via ORAL
  Filled 2023-02-24 (×5): qty 1

## 2023-02-24 MED ORDER — ONDANSETRON HCL 4 MG PO TABS: 4.0000 mg | ORAL_TABLET | Freq: Four times a day (QID) | ORAL | Status: DC | PRN

## 2023-02-24 MED ORDER — SODIUM CHLORIDE 0.9 % IV SOLN
1.0000 g | Freq: Once | INTRAVENOUS | Status: AC
  Administered 2023-02-24: 1 g via INTRAVENOUS
  Filled 2023-02-24: qty 10

## 2023-02-24 MED ORDER — AZITHROMYCIN 250 MG PO TABS
500.0000 mg | ORAL_TABLET | Freq: Every day | ORAL | Status: DC
  Administered 2023-02-25 – 2023-02-28 (×4): 500 mg via ORAL
  Filled 2023-02-24 (×5): qty 2

## 2023-02-24 MED ORDER — DEXTROSE 50 % IV SOLN
INTRAVENOUS | Status: AC
  Filled 2023-02-24: qty 50

## 2023-02-24 MED ORDER — IPRATROPIUM-ALBUTEROL 0.5-2.5 (3) MG/3ML IN SOLN
3.0000 mL | Freq: Once | RESPIRATORY_TRACT | Status: AC
  Administered 2023-02-24: 3 mL via RESPIRATORY_TRACT
  Filled 2023-02-24: qty 3

## 2023-02-24 MED ORDER — IPRATROPIUM-ALBUTEROL 0.5-2.5 (3) MG/3ML IN SOLN: 3.0000 mL | Freq: Four times a day (QID) | RESPIRATORY_TRACT | Status: DC

## 2023-02-24 MED ORDER — METOPROLOL TARTRATE 50 MG PO TABS
50.0000 mg | ORAL_TABLET | Freq: Two times a day (BID) | ORAL | Status: DC
  Administered 2023-02-24 – 2023-03-01 (×9): 50 mg via ORAL
  Filled 2023-02-24 (×10): qty 1

## 2023-02-24 MED ORDER — DEXTROSE 50 % IV SOLN
50.0000 mL | Freq: Once | INTRAVENOUS | Status: AC
  Administered 2023-02-24: 50 mL via INTRAVENOUS

## 2023-02-24 MED ORDER — DEXTROSE-SODIUM CHLORIDE 5-0.45 % IV SOLN
INTRAVENOUS | Status: DC
  Filled 2023-02-24: qty 1000

## 2023-02-24 NOTE — ED Notes (Signed)
Lab/ PBT x2, and family x2 at [facility]
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
## 2023-02-24 NOTE — ED Notes (Signed)
Lab successful at [facility]. Blood sent. EDP into room. Pt family updated. Pt resting comfortably.

## 2023-02-24 NOTE — ED Notes (Signed)
Family back at [facility]

## 2023-02-24 NOTE — ED Notes (Signed)
Given pillow, sheet & repositioned. Given sips of water.

## 2023-02-24 NOTE — ED Notes (Signed)
Difficult IV access. 1st BC obtained. Family x2 arrives to [facility]. Xray into room. Frequent persistent congested cough present.

## 2023-02-24 NOTE — ED Notes (Signed)
Unsuccessful blood draw with IV, PBT notified.

## 2023-02-24 NOTE — ED Notes (Signed)
Alert, NAD, calm, interactive, resps e/u, speaking clearly, coherent, some confusion, answers questions, follows commands. Improved from arrival.

## 2023-02-24 NOTE — ED Notes (Addendum)
Staff at [employer] [facility] [employer] called Staff Updated on Pt. Being admitted to hospital

## 2023-02-24 NOTE — ED Triage Notes (Addendum)
Pt brought in by [facility] from [facility] with initial c/o "diabetic problems". EMS arrived and staff reported to them that pt's blood sugar was low at 80. EMS reports their CBG was 115. Facility then stated that she had AMS, but was unable to say for how long, and had also recently been c/o pain to left side. Pt denies pain and weakness at this time. EMS reports temp of 102, HR 61, BP 170/76. Pt is coughing upon arrival to ED and EMS reports the facility said she had a negative Covid test yesterday but they did not report any SOB to EMS.

## 2023-02-24 NOTE — ED Provider Notes (Signed)
[place] [facility]ERGENC[facility] DEPARTMENT AT [facility] Provider Note   CSN: [id number] Arrival date & time: 02/24/23  1313     History  Chief Complaint  Patient presents with   Altered Mental Status    [name] is a 77 y.o. female.  77 year old female with past medical history of congenital blindness and COPD presenting to the emergency department today with cough and worsening confusion.  The patient has been having intermittent confusion over the past few months per her family.  They report this is worse over the past week.  The patient has had a cough since Monday.  She is coughing up some sputum intermittently.  She presented from her nursing facility today for further evaluation.  The patient currently is denying any chest pain, shortness of breath, or other complaints.  Her family does report that she was recently diagnosed with COPD.   Altered Mental Status      Home Medications Prior to Admission medications   Medication Sig Start Date End Date Taking? Authorizing Provider  albuterol (VENTOLIN HFA) 108 (90 Base) MCG/ACT inhaler Inhale 2 puffs into the lungs every 6 (six) hours as needed for wheezing or shortness of breath. 07/02/21  Yes Vassie Loll, MD  amitriptyline (ELAVIL) 50 MG tablet Take 50 mg by mouth at bedtime.   Yes [provider]  apixaban (ELIQUIS) 5 MG TABS tablet Take 1 tablet (5 mg total) by mouth 2 (two) times daily. 09/27/22 02/24/23 Yes Shahmehdi, Gemma Payor, MD  azaTHIOprine (IMURAN) 50 MG tablet Take 3 tablets (150 mg total) by mouth daily. 04/28/22  Yes Carlan, Chelsea L, NP  calcium carbonate (TUMS - DOSED IN MG ELEMENTAL CALCIUM) 500 MG chewable tablet Chew 1,000 mg by mouth every 8 (eight) hours as needed for heartburn.   Yes [provider]  Cholecalciferol 50 MCG (2000 UT) TABS Take 2,000 Units by mouth daily at 6 (six) AM.   Yes [provider]  dextromethorphan (DELSYM) 30 MG/5ML liquid Take 10 mLs by mouth 2 (two)  times daily as needed for cough.   Yes [provider]  ezetimibe (ZETIA) 10 MG tablet Take 1 tablet (10 mg total) by mouth daily. 12/29/21  Yes Branch, Dorothe Pea, MD  glipiZIDE (GLUCOTROL) 5 MG tablet Take 5 mg by mouth 2 (two) times daily. 10/02/19  Yes [provider]  insulin lispro (HUMALOG) 100 UNIT/ML KwikPen Inject 2-15 Units into the skin 3 (three) times daily before meals. CBG < 121 = 0 units, 121-150 = 2 units, 151-200 = 3 units, 201-250 = 5 units, 251-300 = 8 units, 301-400 = 15 units.   Yes [provider]  LANTUS SOLOSTAR 100 UNIT/ML Solostar Pen Inject 20 Units into the skin 2 (two) times daily. Prime pen with 2u prior to each use 06/22/20  Yes [provider]  lidocaine (LIDODERM) 5 % Place 1 patch onto the skin daily. On for 12 hours, off for 12 hours. 06/09/21  Yes [provider]  loperamide (IMODIUM A-D) 2 MG tablet Take 4 mg by mouth as needed for diarrhea or loose stools.   Yes [provider]  loratadine (CLARITIN) 10 MG tablet Take 10 mg by mouth daily.   Yes [provider]  Magnesium Hydroxide (MILK OF MAGNESIA PO) Take by mouth. As needed   Yes [provider]  magnesium oxide (MAG-OX) 400 MG tablet Take 400 mg by mouth daily.   Yes [provider]  melatonin 5 MG TABS Take 5  mg by mouth at bedtime.   Yes [provider]  metoprolol tartrate (LOPRESSOR) 50 MG tablet Take 1 tablet (50 mg total) by mouth 2 (two) times daily. 09/27/22 02/24/23 Yes Shahmehdi, Gemma Payor, MD  NYSTATIN powder Apply 1 Application topically 3 (three) times daily as needed (redness under abdominal folds and breasts). 08/07/22  Yes [provider]  Phenylephrine-Mineral Oil-Pet (HEMORRHOIDAL) 0.25-14-71.9 % OINT Place 1 application rectally every 6 (six) hours as needed (hemorrhoid discomfort).   Yes [provider]  polyethylene glycol (MIRALAX / GLYCOLAX) 17 g packet Take 17 g by mouth daily.   Yes  [provider]  potassium chloride (MICRO-K) 10 MEQ CR capsule Take 20 mEq by mouth 2 (two) times daily. 11/25/22  Yes [provider]  predniSONE (DELTASONE) 10 MG tablet Take 10 mg by mouth daily. 12/28/22  Yes [provider]  promethazine-dextromethorphan (PROMETHAZINE-DM) 6.25-15 MG/5ML syrup Take 5 mLs by mouth 4 (four) times daily as needed for cough.   Yes [provider]  Pseudoephedrine-guaiFENesin (ROBITUSSIN PE PO) Take 10 mLs by mouth as needed (cough). As needed   Yes [provider]  sertraline (ZOLOFT) 25 MG tablet Take 25 mg by mouth daily. 06/09/21  Yes [provider]      Allergies    Codeine    Review of Systems   Review of Systems  Reason unable to perform ROS: Patient confused.  Respiratory:  Positive for cough.   All other systems reviewed and are negative.   Physical Exam Updated Vital Signs BP (!) 139/50   Pulse 66   Temp 98.6 F (37 C) (Oral)   Resp 17   Ht 5\' 1"  (1.549 m)   Wt 79.8 kg   SpO2 97%   BMI 33.24 kg/m  Physical Exam Vitals and nursing note reviewed.   Gen: NAD, coughing intermittently throughout interview Eyes: PERRL, EOMI HEENT: no oropharyngeal swelling Neck: trachea midline Resp: Scattered wheezes throughout all lung fields, slightly diminished Card: RRR, no murmurs, rubs, or gallops Abd: nontender, nondistended Extremities: no calf tenderness, no edema Vascular: 2+ radial pulses bilaterally, 2+ DP pulses bilaterally Skin: no rashes Psyc: acting appropriately   ED Results / Procedures / Treatments   Labs (all labs ordered are listed, but only abnormal results are displayed) Labs Reviewed  COMPREHENSIVE METABOLIC PANEL - Abnormal; Notable for the following components:      Result Value   Sodium 134 (*)    Glucose, Bld 109 (*)    Calcium 8.6 (*)    Albumin 3.1 (*)    AST 69 (*)    Total Bilirubin 1.4 (*)    All other components within normal limits  LACTIC ACID,  PLASMA - Abnormal; Notable for the following components:   Lactic Acid, Venous 2.1 (*)    All other components within normal limits  LACTIC ACID, PLASMA - Abnormal; Notable for the following components:   Lactic Acid, Venous 2.6 (*)    All other components within normal limits  CBC WITH DIFFERENTIAL/PLATELET - Abnormal; Notable for the following components:   WBC 2.7 (*)    RBC 2.98 (*)    Hemoglobin 10.9 (*)    HCT 32.5 (*)    MCV 109.1 (*)    MCH 36.6 (*)    RDW 16.9 (*)    Lymphs Abs 0.5 (*)    Abs Immature Granulocytes 0.10 (*)    All other components within normal limits  PROTIME-INR - Abnormal; Notable for the following components:  Prothrombin Time 20.6 (*)    INR 1.7 (*)    All other components within normal limits  URINALYSIS, W/ REFLEX TO CULTURE (INFECTION SUSPECTED) - Abnormal; Notable for the following components:   Color, Urine AMBER (*)    Glucose, UA 50 (*)    Hgb urine dipstick SMALL (*)    Protein, ur 30 (*)    Nitrite POSITIVE (*)    Leukocytes,Ua MODERATE (*)    Bacteria, UA FEW (*)    All other components within normal limits  BLOOD GAS, VENOUS - Abnormal; Notable for the following components:   pO2, Ven <31 (*)    Bicarbonate 31.2 (*)    Acid-Base Excess 6.2 (*)    All other components within normal limits  BRAIN NATRIURETIC PEPTIDE - Abnormal; Notable for the following components:   B Natriuretic Peptide 191.0 (*)    All other components within normal limits  GLUCOSE, CAPILLARY - Abnormal; Notable for the following components:   Glucose-Capillary 217 (*)    All other components within normal limits  TSH - Abnormal; Notable for the following components:   TSH 25.633 (*)    All other components within normal limits  CBG MONITORING, ED - Abnormal; Notable for the following components:   Glucose-Capillary 68 (*)    All other components within normal limits  TROPONIN I (HIGH SENSITIVITY) - Abnormal; Notable for the following components:   Troponin I  (High Sensitivity) 27 (*)    All other components within normal limits  TROPONIN I (HIGH SENSITIVITY) - Abnormal; Notable for the following components:   Troponin I (High Sensitivity) 25 (*)    All other components within normal limits  RESP PANEL BY RT-PCR (RSV, FLU A&B, COVID)  RVPGX2  CULTURE, BLOOD (ROUTINE X 2)  CULTURE, BLOOD (ROUTINE X 2)  RESPIRATORY PANEL BY PCR  URINE CULTURE  AMMONIA  VITAMIN B12  FOLATE  RPR  HIV ANTIBODY (ROUTINE TESTING W REFLEX)  BASIC METABOLIC PANEL  CBC  T4, FREE  CBG MONITORING, ED    EKG None  Radiology DG Chest Port 1 View  Result Date: 02/24/2023 CLINICAL DATA:  Possible sepsis. EXAM: PORTABLE CHEST 1 VIEW COMPARISON:  September 22, 2022. FINDINGS: Stable cardiomediastinal silhouette. Both lungs are clear. The visualized skeletal structures are unremarkable. IMPRESSION: No active disease. Electronically Signed   By: Lupita Raider M.D.   On: 02/24/2023 16:19    Procedures Procedures    Medications Ordered in ED Medications  acetaminophen (TYLENOL) tablet 650 mg (has no administration in time range)    Or  acetaminophen (TYLENOL) suppository 650 mg (has no administration in time range)  ondansetron (ZOFRAN) tablet 4 mg (has no administration in time range)    Or  ondansetron (ZOFRAN) injection 4 mg (has no administration in time range)  albuterol (PROVENTIL) (2.5 MG/3ML) 0.083% nebulizer solution 2.5 mg (has no administration in time range)  hydrALAZINE (APRESOLINE) injection 5 mg (has no administration in time range)  dextrose 5 %-0.45 % sodium chloride infusion ( Intravenous New Bag/Given 02/24/23 2151)  amitriptyline (ELAVIL) tablet 50 mg (has no administration in time range)  apixaban (ELIQUIS) tablet 5 mg (has no administration in time range)  azaTHIOprine (IMURAN) tablet 150 mg (has no administration in time range)  calcium carbonate (TUMS - dosed in mg elemental calcium) chewable tablet 1,000 mg (has no administration in time  range)  cholecalciferol (VITAMIN D3) 25 MCG (1000 UNIT) tablet 2,000 Units (has no administration in time range)  ezetimibe (ZETIA) tablet  10 mg (has no administration in time range)  magnesium oxide (MAG-OX) tablet 400 mg (has no administration in time range)  melatonin tablet 6 mg (has no administration in time range)  metoprolol tartrate (LOPRESSOR) tablet 50 mg (has no administration in time range)  sertraline (ZOLOFT) tablet 25 mg (has no administration in time range)  cefTRIAXone (ROCEPHIN) 1 g in sodium chloride 0.9 % 100 mL IVPB (has no administration in time range)  azithromycin (ZITHROMAX) tablet 500 mg (has no administration in time range)  predniSONE (DELTASONE) tablet 40 mg (has no administration in time range)    Followed by  predniSONE (DELTASONE) tablet 10 mg (has no administration in time range)  cyanocobalamin (VITAMIN B12) injection 1,000 mcg (has no administration in time range)  ipratropium-albuterol (DUONEB) 0.5-2.5 (3) MG/3ML nebulizer solution 3 mL (has no administration in time range)  methylPREDNISolone sodium succinate (SOLU-MEDROL) 125 mg/2 mL injection 125 mg (125 mg Intravenous Given 02/24/23 1622)  ipratropium-albuterol (DUONEB) 0.5-2.5 (3) MG/3ML nebulizer solution 3 mL (3 mLs Nebulization Given 02/24/23 1622)  cefTRIAXone (ROCEPHIN) 1 g in sodium chloride 0.9 % 100 mL IVPB (0 g Intravenous Stopped 02/24/23 1713)  azithromycin (ZITHROMAX) tablet 500 mg (500 mg Oral Given 02/24/23 1621)  acetaminophen (TYLENOL) tablet 650 mg (650 mg Oral Given 02/24/23 1621)  lactated ringers bolus 1,000 mL (0 mLs Intravenous Stopped 02/24/23 2003)  dextrose 50 % solution 50 mL (50 mLs Intravenous Given 02/24/23 2025)    ED Course/ Medical Decision Making/ A&P                                 Medical Decision Making 77 year old female with past medical history of congenital blindness and COPD presenting to the emergency department today with cough and confusion that is worse over  the past week.  The patient does have some wheezing here on exam.  She is febrile here on arrival.  I will further evaluate her here with a sepsis workup.  Will cover empirically with Rocephin and azithromycin.  I will give the patient Solu-Medrol as well as DuoNebs given her history of COPD.  Will obtain a COVID and flu swab on the patient.  I will reevaluate for ultimate disposition.  The patient's white blood cell count is low.  Chest x-ray is clear but the patient does have some coarse breath sounds on exam with a cough so I suspect this is the source of her infection.  Initial lactate was 2.1 but did go up to 2.6.  1 L of IV fluids is ordered.  Given the intermittent confusion with generalized weakness and calls placed to hospitalist service for admission with cultures pending.  It does seem that the confusion has been intermittent over the past few months but the family does think that it may be a little worse over the past few days but she certainly is generally weak compared to her baseline.  Amount and/or Complexity of Data Reviewed Labs: ordered.  Risk OTC drugs. Prescription drug management. Decision regarding hospitalization.           Final Clinical Impression(s) / ED Diagnoses Final diagnoses:  Upper respiratory tract infection, unspecified type  Lactic acidosis  Intermittent confusion    Rx / DC Orders ED Discharge Orders     None         Durwin Glaze, MD 02/24/23 2312

## 2023-02-24 NOTE — ED Notes (Addendum)
In room to ready pt for transport, noticed pt was diaphoretic. Obtained CBG, blood glucose 68. Dr Randol Kern sent text chat. Awaiting reply. Pt remains alert and oriented. 4 ounces orange juice given po.

## 2023-02-25 DIAGNOSIS — H548 Legal blindness, as defined in USA: Secondary | ICD-10-CM | POA: Diagnosis not present

## 2023-02-25 DIAGNOSIS — J189 Pneumonia, unspecified organism: Secondary | ICD-10-CM

## 2023-02-25 DIAGNOSIS — R4182 Altered mental status, unspecified: Secondary | ICD-10-CM | POA: Diagnosis not present

## 2023-02-25 DIAGNOSIS — E039 Hypothyroidism, unspecified: Secondary | ICD-10-CM

## 2023-02-25 DIAGNOSIS — J441 Chronic obstructive pulmonary disease with (acute) exacerbation: Secondary | ICD-10-CM

## 2023-02-25 LAB — GLUCOSE, CAPILLARY
Glucose-Capillary: 267 mg/dL — ABNORMAL HIGH (ref 70–99)
Glucose-Capillary: 308 mg/dL — ABNORMAL HIGH (ref 70–99)
Glucose-Capillary: 303 mg/dL — ABNORMAL HIGH (ref 70–99)
Glucose-Capillary: 329 mg/dL — ABNORMAL HIGH (ref 70–99)

## 2023-02-25 LAB — RESPIRATORY PANEL BY PCR

## 2023-02-25 LAB — LACTIC ACID, PLASMA
Lactic Acid, Venous: 3.4 mmol/L (ref 0.5–1.9)
Lactic Acid, Venous: 4.8 mmol/L (ref 0.5–1.9)
Lactic Acid, Venous: 5.3 mmol/L (ref 0.5–1.9)

## 2023-02-25 LAB — BASIC METABOLIC PANEL
Anion gap: 10 (ref 5–15)
BUN: 17 mg/dL (ref 8–23)
CO2: 23 mmol/L (ref 22–32)
Calcium: 8.2 mg/dL — ABNORMAL LOW (ref 8.9–10.3)
Chloride: 99 mmol/L (ref 98–111)
Creatinine, Ser: 0.71 mg/dL (ref 0.44–1.00)
GFR, Estimated: >60 mL/min (ref >60)
Glucose, Bld: 286 mg/dL — ABNORMAL HIGH (ref 70–99)
Potassium: 4.1 mmol/L (ref 3.5–5.1)
Sodium: 132 mmol/L — ABNORMAL LOW (ref 135–145)

## 2023-02-25 LAB — CBC
HCT: 33.6 % — ABNORMAL LOW (ref 36.0–46.0)
Hemoglobin: 10.7 g/dL — ABNORMAL LOW (ref 12.0–15.0)
MCH: 35.3 pg — ABNORMAL HIGH (ref 26.0–34.0)
MCHC: 31.8 g/dL (ref 30.0–36.0)
MCV: 110.9 fL — ABNORMAL HIGH (ref 80.0–100.0)
Platelets: 154 10*3/uL (ref 150–400)
RBC: 3.03 MIL/uL — ABNORMAL LOW (ref 3.87–5.11)
RDW: 16.7 % — ABNORMAL HIGH (ref 11.5–15.5)
WBC: 3 10*3/uL — ABNORMAL LOW (ref 4.0–10.5)
nRBC: 0 % (ref 0.0–0.2)

## 2023-02-25 LAB — HIV ANTIBODY (ROUTINE TESTING W REFLEX): HIV Screen 4th Generation wRfx: NONREACTIVE

## 2023-02-25 LAB — T4, FREE: Free T4: <0.25 ng/dL — ABNORMAL LOW (ref 0.61–1.12)

## 2023-02-25 LAB — MRSA NEXT GEN BY PCR, NASAL: MRSA by PCR Next Gen: NOT DETECTED

## 2023-02-25 MED ORDER — LORATADINE 10 MG PO TABS
10.0000 mg | ORAL_TABLET | Freq: Every day | ORAL | Status: DC
  Administered 2023-02-25 – 2023-03-01 (×5): 10 mg via ORAL
  Filled 2023-02-25 (×5): qty 1

## 2023-02-25 MED ORDER — INSULIN ASPART 100 UNIT/ML IJ SOLN: 0.0000 [IU] | Freq: Three times a day (TID) | INTRAMUSCULAR | Status: DC

## 2023-02-25 MED ORDER — LACTATED RINGERS IV BOLUS (SEPSIS)
1000.0000 mL | Freq: Once | INTRAVENOUS | Status: AC
  Administered 2023-02-25: 1000 mL via INTRAVENOUS

## 2023-02-25 MED ORDER — IPRATROPIUM-ALBUTEROL 0.5-2.5 (3) MG/3ML IN SOLN
3.0000 mL | Freq: Three times a day (TID) | RESPIRATORY_TRACT | Status: DC
  Administered 2023-02-25 – 2023-02-28 (×10): 3 mL via RESPIRATORY_TRACT
  Filled 2023-02-25 (×10): qty 3

## 2023-02-25 MED ORDER — SODIUM CHLORIDE 0.9 % IV SOLN: INTRAVENOUS | Status: DC

## 2023-02-25 MED ORDER — DEXTROMETHORPHAN POLISTIREX ER 30 MG/5ML PO SUER
10.0000 mL | Freq: Two times a day (BID) | ORAL | Status: DC | PRN
  Administered 2023-02-25 – 2023-02-26 (×3): 60 mg via ORAL
  Filled 2023-02-25 (×2): qty 10

## 2023-02-25 MED ORDER — INSULIN ASPART 100 UNIT/ML IJ SOLN
0.0000 [IU] | Freq: Three times a day (TID) | INTRAMUSCULAR | Status: DC
  Administered 2023-02-25: 11 [IU] via SUBCUTANEOUS

## 2023-02-25 MED ORDER — LEVOTHYROXINE SODIUM 100 MCG PO TABS
100.0000 ug | ORAL_TABLET | Freq: Every day | ORAL | Status: DC
  Administered 2023-02-26 – 2023-03-01 (×4): 100 ug via ORAL
  Filled 2023-02-25 (×4): qty 1

## 2023-02-25 MED ORDER — INSULIN GLARGINE-YFGN 100 UNIT/ML ~~LOC~~ SOLN
15.0000 [IU] | Freq: Every day | SUBCUTANEOUS | Status: DC
  Administered 2023-02-25 – 2023-02-26 (×2): 15 [IU] via SUBCUTANEOUS
  Filled 2023-02-25 (×4): qty 0.15

## 2023-02-25 MED ORDER — INSULIN ASPART 100 UNIT/ML IJ SOLN
0.0000 [IU] | Freq: Three times a day (TID) | INTRAMUSCULAR | Status: DC
  Administered 2023-02-25: 15 [IU] via SUBCUTANEOUS
  Administered 2023-02-26: 4 [IU] via SUBCUTANEOUS
  Administered 2023-02-26: 7 [IU] via SUBCUTANEOUS

## 2023-02-25 MED ORDER — INSULIN ASPART 100 UNIT/ML IJ SOLN
5.0000 [IU] | Freq: Three times a day (TID) | INTRAMUSCULAR | Status: DC
  Administered 2023-02-25 – 2023-02-26 (×4): 5 [IU] via SUBCUTANEOUS

## 2023-02-25 MED ORDER — SODIUM CHLORIDE 0.9 % IV SOLN
2.0000 g | INTRAVENOUS | Status: DC
  Administered 2023-02-25 – 2023-02-28 (×4): 2 g via INTRAVENOUS
  Filled 2023-02-25 (×5): qty 20

## 2023-02-25 MED ORDER — INSULIN ASPART 100 UNIT/ML IJ SOLN
0.0000 [IU] | Freq: Every day | INTRAMUSCULAR | Status: DC
  Administered 2023-02-25: 4 [IU] via SUBCUTANEOUS
  Administered 2023-02-26: 2 [IU] via SUBCUTANEOUS

## 2023-02-25 MED ORDER — LIDOCAINE 5 % EX PTCH
1.0000 | MEDICATED_PATCH | Freq: Every day | CUTANEOUS | Status: DC
  Administered 2023-02-25 – 2023-03-01 (×5): 1 via TRANSDERMAL
  Filled 2023-02-25 (×5): qty 1

## 2023-02-25 MED ORDER — POLYETHYLENE GLYCOL 3350 17 G PO PACK
17.0000 g | PACK | Freq: Every day | ORAL | Status: DC
  Filled 2023-02-25 (×2): qty 1

## 2023-02-25 MED ORDER — LACTATED RINGERS IV BOLUS
1000.0000 mL | Freq: Once | INTRAVENOUS | Status: AC
  Administered 2023-02-25: 1000 mL via INTRAVENOUS

## 2023-02-25 MED ORDER — LOPERAMIDE HCL 2 MG PO CAPS: 4.0000 mg | ORAL_CAPSULE | ORAL | Status: DC | PRN

## 2023-02-25 MED ORDER — LACTATED RINGERS IV BOLUS (SEPSIS)
500.0000 mL | Freq: Once | INTRAVENOUS | Status: AC
  Administered 2023-02-25: 500 mL via INTRAVENOUS

## 2023-02-25 MED ORDER — NYSTATIN 100000 UNIT/GM EX POWD: 1.0000 | Freq: Three times a day (TID) | CUTANEOUS | Status: DC | PRN

## 2023-02-25 NOTE — Hospital Course (Signed)
77 y.o. female,  with a history of COPD, hypertension, hyperlipidemia, hypothyroidism, CAD s/p stent placement, GERD, diabetes mellitus type 2, blindness, obesity. Pt is SNF resident, she was brought by the facility for intermittent confusion, this has been recurrent over last few weeks per her family, but has much worsened over the last week, patient has been having cough since Monday, some phlegm present, so she was sent for further evaluation, she herself denies any chest pain, dyspnea, but she reports cough, phlegm, she denies any urinary symptoms, she is with known history of COPD as well. -In ED patient was febrile 100.5, initially confused, but mentation much improved, chest x-ray with no acute findings, UA still pending, as were significant for lactic acid of 2.6, white blood cell count of 2.7, [facility] hospitalist consulted to admit.

## 2023-02-25 NOTE — Inpatient Diabetes Management (Signed)
Inpatient Diabetes Program Recommendations  AACE/ADA: New Consensus Statement on Inpatient Glycemic Control (2015)  Target Ranges:  Prepandial:   less than 140 mg/dL      Peak postprandial:   less than 180 mg/dL (1-2 hours)      Critically ill patients:  140 - 180 mg/dL   Lab Results  Component Value Date   GLUCAP 267 (H) 02/25/2023   HGBA1C 10.1 (H) 09/22/2022    Review of Glycemic Control  Latest Reference Range & Units 02/24/23 16:45 02/24/23 20:14 02/24/23 21:00 02/25/23 05:59  Glucose-Capillary 70 - 99 mg/dL 83 68 (L) 409 (H) 811 (H)   Diabetes history: DM 2 Outpatient Diabetes medications: Glipizide 5 mg bid, Humalog 2-15 units tid, Lantus 20 units qam, 15 units qpm Current orders for Inpatient glycemic control:  None  PO prednisone 40 mg Daily  Inpatient Diabetes Program Recommendations:    -   start Novolog 0-9 units tid + hs  Thanks, Christena Deem RN, MSN, BC-ADM Inpatient Diabetes Coordinator Team Pager [phone] (8a-5p)

## 2023-02-25 NOTE — Progress Notes (Signed)
PROGRESS NOTE   [name]  NWG:[id number] DOB: November 26, 1945 DOA: 02/24/2023 PCP: Galvin Proffer, MD   Chief Complaint  Patient presents with   Altered Mental Status   Level of care: Telemetry  Brief Admission History:  77 y.o. female,  with a history of COPD, hypertension, hyperlipidemia, hypothyroidism, CAD s/p stent placement, GERD, diabetes mellitus type 2, blindness, obesity. Pt is SNF resident, she was brought by the facility for intermittent confusion, this has been recurrent over last few weeks per her family, but has much worsened over the last week, patient has been having cough since Monday, some phlegm present, so she was sent for further evaluation, she herself denies any chest pain, dyspnea, but she reports cough, phlegm, she denies any urinary symptoms, she is with known history of COPD as well. -In ED patient was febrile 100.5, initially confused, but mentation much improved, chest x-ray with no acute findings, UA still pending, as were significant for lactic acid of 2.6, white blood cell count of 2.7, [facility] hospitalist consulted to admit.   Assessment and Plan:  Sepsis presumably from HCAP - pt remains symptomatic - lactic acid rising - LR sepsis bolus protocol ordered - recheck lactic acid after bolus completed - continue IV antibiotics and supportive measures - recheck CXR in AM   COPD with acute exacerbation - continue current mgmt with steroids, bronchodilators, nebs  Paroxysmal Afib  - resumed home apixaban and metoprolol XL  Acute metabolic encephalopathy - mentation improving with supportive measures   Essential hypertension  - stable on home meds  Hypothyroidism - TSH  markedly elevated  - had no longer been taking thyroid replacement - follow up free T4 <0.25 - restart levothyroxine   Autoimmune hepatitis  - resumed prednisone and imuran  GERD - pantoprazole for GI protection  DM type 2 with hyperglycemia  - resumed SSI coverage,  prandial and basal coverage - check CBG 5 times per day CBG (last 3)  Recent Labs    02/24/23 2100 02/25/23 0559 02/25/23 1300  GLUCAP 217* 267* 308*   DVT prophylaxis: apixaban Code Status: Full  Family Communication:  Disposition: return SNF when medically stable    Consultants:   Procedures:   Antimicrobials:    Subjective: Pt having thick yellow purulent sputum production  Objective: Vitals:   02/25/23 0833 02/25/23 0945 02/25/23 1303 02/25/23 1459  BP:  (!) 143/63  (!) 155/65  Pulse:  64  (!) 59  Resp:      Temp:    98 F (36.7 C)  TempSrc:    Oral  SpO2: 90%  93% 97%  Weight:      Height:        Intake/Output Summary (Last 24 hours) at 02/25/2023 1609 Last data filed at 02/25/2023 1514 Gross per 24 hour  Intake 3096.79 ml  Output --  Net 3096.79 ml   Filed Weights   02/24/23 1350 02/24/23 2054  Weight: 79.8 kg 82.9 kg   Examination:  General exam: Appears calm and comfortable  Respiratory system: rales heard bilateral.  Cardiovascular system: normal S1 & S2 heard. No JVD, murmurs, rubs, gallops or clicks. No pedal edema. Gastrointestinal system: Abdomen is nondistended, soft and nontender. No organomegaly or masses felt. Normal bowel sounds heard. Central nervous system: Alert and oriented. No focal neurological deficits. Extremities: Symmetric 5 x 5 power. Skin: No rashes, lesions or ulcers. Psychiatry: Judgement and insight appear normal. Mood & affect appropriate.   Data Reviewed: I have personally reviewed following labs  and imaging studies  CBC: Recent Labs  Lab 02/24/23 1514 02/25/23 0444  WBC 2.7* 3.0*  NEUTROABS 2.0  --   HGB 10.9* 10.7*  HCT 32.5* 33.6*  MCV 109.1* 110.9*  PLT 159 154    Basic Metabolic Panel: Recent Labs  Lab 02/24/23 1514 02/25/23 0444  NA 134* 132*  K 4.3 4.1  CL 98 99  CO2 25 23  GLUCOSE 109* 286*  BUN 16 17  CREATININE 0.82 0.71  CALCIUM 8.6* 8.2*    CBG: Recent Labs  Lab 02/24/23 1645  02/24/23 2014 02/24/23 2100 02/25/23 0559 02/25/23 1300  GLUCAP 83 68* 217* 267* 308*    Recent Results (from the past 240 hour(s))  Culture, blood (Routine x 2)     Status: None (Preliminary result)   Collection Time: 02/24/23  2:18 PM   Specimen: BLOOD  Result Value Ref Range Status   Specimen Description BLOOD BLOOD RIGHT ARM  Final   Special Requests   Final    BOTTLES DRAWN AEROBIC AND ANAEROBIC Blood Culture results may not be optimal due to an excessive volume of blood received in culture bottles   Culture   Final    NO GROWTH < 24 HOURS Performed at [facility], [address] [place], [place] [address]    Report Status PENDING  Incomplete  Resp panel by RT-PCR (RSV, Flu A&B, Covid) Anterior Nasal Swab     Status: None   Collection Time: 02/24/23  2:40 PM   Specimen: Anterior Nasal Swab  Result Value Ref Range Status   SARS Coronavirus 2 by RT PCR NEGATIVE NEGATIVE Final    Comment: (NOTE) SARS-CoV-2 target nucleic acids are NOT DETECTED.  The SARS-CoV-2 RNA is generally detectable in upper respiratory specimens during the acute phase of infection. The lowest concentration of SARS-CoV-2 viral copies this assay can detect is 138 copies/mL. A negative result does not preclude SARS-Cov-2 infection and should not be used as the sole basis for treatment or other patient management decisions. A negative result may occur with  improper specimen collection/handling, submission of specimen other than nasopharyngeal swab, presence of viral mutation(s) within the areas targeted by this assay, and inadequate number of viral copies(<138 copies/mL). A negative result must be combined with clinical observations, patient history, and epidemiological information. The expected result is Negative.  Fact Sheet for Patients:  BloggerCourse.com  Fact Sheet for Healthcare Providers:  SeriousBroker.it  This test is no t yet approved or  cleared by the [place] FDA and  has been authorized for detection and/or diagnosis of SARS-CoV-2 by FDA under an Emergency Use Authorization (EUA). This EUA will remain  in effect (meaning this test can be used) for the duration of the COVID-19 declaration under Section 564(b)(1) of the Act, 21 U.S.C.section 360bbb-3(b)(1), unless the authorization is terminated  or revoked sooner.       Influenza A by PCR NEGATIVE NEGATIVE Final   Influenza B by PCR NEGATIVE NEGATIVE Final    Comment: (NOTE) The Xpert Xpress SARS-CoV-2/FLU/RSV plus assay is intended as an aid in the diagnosis of influenza from Nasopharyngeal swab specimens and should not be used as a sole basis for treatment. Nasal washings and aspirates are unacceptable for Xpert Xpress SARS-CoV-2/FLU/RSV testing.  Fact Sheet for Patients: BloggerCourse.com  Fact Sheet for Healthcare Providers: SeriousBroker.it  This test is not yet approved or cleared by the [place] FDA and has been authorized for detection and/or diagnosis of SARS-CoV-2 by FDA under an Emergency Use Authorization (  EUA). This EUA will remain in effect (meaning this test can be used) for the duration of the COVID-19 declaration under Section 564(b)(1) of the Act, 21 U.S.C. section 360bbb-3(b)(1), unless the authorization is terminated or revoked.     Resp Syncytial Virus by PCR NEGATIVE NEGATIVE Final    Comment: (NOTE) Fact Sheet for Patients: BloggerCourse.com  Fact Sheet for Healthcare Providers: SeriousBroker.it  This test is not yet approved or cleared by the [place] FDA and has been authorized for detection and/or diagnosis of SARS-CoV-2 by FDA under an Emergency Use Authorization (EUA). This EUA will remain in effect (meaning this test can be used) for the duration of the COVID-19 declaration under Section 564(b)(1) of the Act, 21  U.S.C. section 360bbb-3(b)(1), unless the authorization is terminated or revoked.  Performed at [facility], [address] [place], [place] [address]   Culture, blood (Routine x 2)     Status: None (Preliminary result)   Collection Time: 02/24/23  3:14 PM   Specimen: BLOOD  Result Value Ref Range Status   Specimen Description BLOOD BLOOD RIGHT ARM  Final   Special Requests   Final    BOTTLES DRAWN AEROBIC AND ANAEROBIC Blood Culture adequate volume   Culture   Final    NO GROWTH < 24 HOURS Performed at [facility], [address] [place], [place] [address]    Report Status PENDING  Incomplete  Respiratory (~20 pathogens) panel by PCR     Status: None   Collection Time: 02/24/23  5:27 PM   Specimen: Nasopharyngeal Swab; Respiratory  Result Value Ref Range Status   Adenovirus NOT DETECTED NOT DETECTED Final   Coronavirus 229E NOT DETECTED NOT DETECTED Final    Comment: (NOTE) The Coronavirus on the Respiratory Panel, DOES NOT test for the novel  Coronavirus (2019 nCoV)    Coronavirus HKU1 NOT DETECTED NOT DETECTED Final   Coronavirus NL63 NOT DETECTED NOT DETECTED Final   Coronavirus OC43 NOT DETECTED NOT DETECTED Final   Metapneumovirus NOT DETECTED NOT DETECTED Final   Rhinovirus / Enterovirus NOT DETECTED NOT DETECTED Final   Influenza A NOT DETECTED NOT DETECTED Final   Influenza B NOT DETECTED NOT DETECTED Final   Parainfluenza Virus 1 NOT DETECTED NOT DETECTED Final   Parainfluenza Virus 2 NOT DETECTED NOT DETECTED Final   Parainfluenza Virus 3 NOT DETECTED NOT DETECTED Final   Parainfluenza Virus 4 NOT DETECTED NOT DETECTED Final   Respiratory Syncytial Virus NOT DETECTED NOT DETECTED Final   Bordetella pertussis NOT DETECTED NOT DETECTED Final   Bordetella Parapertussis NOT DETECTED NOT DETECTED Final   Chlamydophila pneumoniae NOT DETECTED NOT DETECTED Final   Mycoplasma pneumoniae NOT DETECTED NOT DETECTED Final    Comment: Performed at [facility],  [address], [place], [place] [address]  MRSA Next Gen by PCR, Nasal     Status: None   Collection Time: 02/25/23 11:33 AM   Specimen: Nasal Mucosa; Nasal Swab  Result Value Ref Range Status   MRSA by PCR Next Gen NOT DETECTED NOT DETECTED Final    Comment: (NOTE) The GeneXpert MRSA Assay (FDA approved for NASAL specimens only), is one component of a comprehensive MRSA colonization surveillance program. It is not intended to diagnose MRSA infection nor to guide or monitor treatment for MRSA infections. Test performance is not FDA approved in patients less than 25 years old. Performed at [facility], [address] [place], [place] [address]      Radiology Studies: DG  Chest Port 1 View  Result Date: 02/24/2023 CLINICAL DATA:  Possible sepsis. EXAM: PORTABLE CHEST 1 VIEW COMPARISON:  September 22, 2022. FINDINGS: Stable cardiomediastinal silhouette. Both lungs are clear. The visualized skeletal structures are unremarkable. IMPRESSION: No active disease. Electronically Signed   By: Lupita Raider M.D.   On: 02/24/2023 16:19    Scheduled Meds:  amitriptyline  50 mg Oral QHS   apixaban  5 mg Oral BID   azaTHIOprine  150 mg Oral Daily   azithromycin  500 mg Oral Daily   cholecalciferol  2,000 Units Oral Q0600   cyanocobalamin  1,000 mcg Subcutaneous Daily   ezetimibe  10 mg Oral Daily   insulin aspart  0-15 Units Subcutaneous TID WC   insulin aspart  5 Units Subcutaneous TID WC   insulin glargine-yfgn  15 Units Subcutaneous Daily   ipratropium-albuterol  3 mL Nebulization TID   lidocaine  1 patch Transdermal Daily   loratadine  10 mg Oral Daily   magnesium oxide  400 mg Oral Daily   melatonin  6 mg Oral QHS   metoprolol tartrate  50 mg Oral BID   polyethylene glycol  17 g Oral Daily   predniSONE  40 mg Oral Q breakfast   Followed by   Melene Muller ON 02/28/2023] predniSONE  10 mg Oral Q breakfast   sertraline  25 mg Oral Daily   Continuous Infusions:  sodium chloride 75 mL/hr at 02/25/23  1514   cefTRIAXone (ROCEPHIN)  IV Stopped (02/25/23 1014)   lactated ringers     And   lactated ringers     And   lactated ringers       LOS: 1 day   Time spent: 55 mins  Natelie Ostrosky Laural Benes, MD How to contact the [facility] Attending or Consulting provider 7A - 7P or covering provider during after hours 7P -7A, for this patient?  Check the care team in [facility] and look for a) attending/consulting TRH provider listed and b) the [facility] team listed Log into [email or web address] and use [place]'s universal password to access. If you do not have the password, please contact the hospital operator. Locate the [facility] provider you are looking for under [facility] and page to a number that you can be directly reached. If you still have difficulty reaching the provider, please page the [facility] (Director on Call) for the Hospitalists listed on amion for assistance.  02/25/2023, 4:09 PM

## 2023-02-25 NOTE — Progress Notes (Signed)
   02/25/23 2113  Provider Notification  Provider Name/Title Dr Victorino Dike  Date Provider Notified 02/25/23  Time Provider Notified 2113  Method of Notification Page (secure chat)  Notification Reason Critical Result  Test performed and critical result Lactic Acid 5.3  Date Critical Result Received 02/25/23  Provider response See new orders  Date of Provider Response 02/25/23  Time of Provider Response 2117

## 2023-02-25 NOTE — Plan of Care (Signed)
  Problem: Acute Rehab PT Goals(only PT should resolve) Goal: Pt Will Go Supine/Side To Sit Outcome: Progressing Flowsheets (Taken 02/25/2023 1454) Pt will go Supine/Side to Sit:  with contact guard assist  with minimal assist Goal: Patient Will Transfer Sit To/From Stand Outcome: Progressing Flowsheets (Taken 02/25/2023 1454) Patient will transfer sit to/from stand:  with contact guard assist  with minimal assist Goal: Pt Will Transfer Bed To Chair/Chair To Bed Outcome: Progressing Flowsheets (Taken 02/25/2023 1454) Pt will Transfer Bed to Chair/Chair to Bed: with min assist Goal: Pt Will Ambulate Outcome: Progressing Flowsheets (Taken 02/25/2023 1454) Pt will Ambulate:  10 feet  with moderate assist  with minimal assist  with rolling walker   2:55 PM, 02/25/23 Ocie Bob, MPT Physical Therapist with [facility] [phone] office [phone] mobile phone

## 2023-02-25 NOTE — Plan of Care (Signed)

## 2023-02-25 NOTE — Progress Notes (Signed)
Date and time results received: 02/25/23 1240 (use smartphrase ".now" to insert current time)  Test: Lactic Acid Critical Value: 3.4  Name of Provider Notified: Dr. Laural Benes  Orders Received? Or Actions Taken?: MD notified. Fluid bolus ordered.

## 2023-02-25 NOTE — TOC Initial Note (Signed)
Transition of Care [facility]) - Initial/Assessment Note    Patient Details  Name: [name] MRN: [id number] Date of Birth: 04-Jan-1946  [facility] CM/SW Contact:    Leitha Bleak, RN Phone Number: 02/25/2023, 11:06 AM  Clinical Narrative:        Patient admitted with Altered mental status from [facility]F. CM spoke with Tiffany to assess and fill in FL2. PT eval pending. They will accept her back over the weekend. They would like FL2 faxed to review to ensure they can get all her medications. TOC following.            Expected Discharge Plan: Home/Self Care Barriers to Discharge: Continued Medical Work up   Patient Goals and CMS Choice Patient states their goals for this hospitalization and ongoing recovery are:: to return to ALF CMS Medicare.gov Compare Post Acute Care list provided to:: Patient Represenative (must comment)    Expected Discharge Plan and Services      Living arrangements for the past 2 months: Assisted Living Facility                    Prior Living Arrangements/Services Living arrangements for the past 2 months: Assisted Living Facility Lives with:: Facility Resident      Current home services: DME      Orientation: : Oriented to Self, Oriented to Place Alcohol / Substance Use: Not Applicable Psych Involvement: No (comment)  Admission diagnosis:  Lactic acidosis [E87.20] Intermittent confusion [R41.0] Upper respiratory tract infection, unspecified type [J06.9] AMS (altered mental status) [R41.82] Patient Active Problem List   Diagnosis Date Noted   AMS (altered mental status) 02/24/2023   Paroxysmal atrial fibrillation with RVR (HCC) 09/23/2022   Poisoning by unspecified narcotics, intentional self-harm, initial encounter (HCC) 09/22/2022   Pneumonia 09/22/2022   Loose stools 09/14/2022   Bimalleolar fracture of right ankle 06/29/2021   Hypokalemia 06/29/2021   Hyperglycemia due to diabetes mellitus (HCC) 06/29/2021    Insomnia 06/29/2021   Diabetic neuropathy (HCC) 06/29/2021   COVID-19 virus infection 06/29/2021   Autoimmune hepatitis (HCC) 01/20/2021   Elevated LFTs 12/11/2020   NASH (nonalcoholic steatohepatitis) 12/11/2020   Acute lower UTI 06/18/2018   Elevated troponin 06/18/2018   UTI (urinary tract infection) 06/18/2018   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) 06/21/2017   Mastalgia 06/21/2017   Acute diastolic CHF (congestive heart failure) (HCC) 12/20/2016   Adrenal insufficiency (HCC) 12/18/2016   Lobar pneumonia (HCC) 12/15/2016   Acute metabolic encephalopathy 12/15/2016   Thrombocytopenia (HCC) 12/15/2016   CKD (chronic kidney disease) stage 3, GFR 30-59 ml/min (HCC) 12/15/2016   Sepsis (HCC) 12/14/2016   COPD exacerbation (HCC) 04/24/2016   DM type 2 (diabetes mellitus, type 2) (HCC) 04/24/2016   Aortic atherosclerosis (HCC) 04/24/2016   Transaminitis 10/20/2014   Acute bronchitis 10/20/2014   Acute respiratory failure (HCC) 10/17/2014   IBS (irritable bowel syndrome) 08/07/2012   Lower abdominal pain 10/05/2011   Coronary artery disease    Tobacco abuse, in remission    Gastroesophageal reflux disease    Obesity, Class II, BMI 35-39.9 04/01/2010   BLINDNESS 04/01/2010   Hypothyroidism 09/29/2009   Hyperlipidemia, unspecified 09/29/2009   Essential hypertension 09/29/2009   PCP:  Galvin Proffer, MD Pharmacy:   [facility] [place], [place] - [address] [place] [address] Phone: [phone] Fax: [phone]    Social Determinants of Health (SDOH) Social History: SDOH Screenings   Food Insecurity:  No Food Insecurity (09/24/2022)  Housing: Low Risk  (09/24/2022)  Transportation Needs: No Transportation Needs (09/24/2022)  Utilities: Not At Risk (09/24/2022)  Tobacco Use: Medium Risk (02/24/2023)  Health Literacy: High Risk (09/08/2020)   Received from [facility]   SDOH  Interventions:   Readmission Risk Interventions    02/25/2023   11:04 AM 09/23/2022    2:48 PM  Readmission Risk Prevention Plan  Transportation Screening Complete Complete  PCP or Specialist Appt within 3-5 Days Not Complete   HRI or Home Care Consult Complete Complete  Social Work Consult for Recovery Care Planning/Counseling Complete Complete  Palliative Care Screening Not Applicable Not Applicable  Medication Review Oceanographer) Complete Complete

## 2023-02-25 NOTE — Evaluation (Signed)
Physical Therapy Evaluation Patient Details Name: [name] MRN: [id number] DOB: [birth year] Today's Date: 02/25/2023  History of Present Illness  Joselyne Kolis  is a 77 y.o. female,  with a history of COPD, hypertension, hyperlipidemia, hypothyroidism, CAD s/p stent placement, GERD, diabetes mellitus type 2, blindness, obesity.  -Audree Bane is SNF resident, she was brought by the facility for intermittent confusion, this has been recurrent over last few weeks per her family, but has much worsened over the last week, patient has been having cough since Monday, some phlegm present, so she was sent for further evaluation, she herself denies any chest pain, dyspnea, but she reports cough, phlegm, she denies any urinary symptoms, she is with known history of COPD PD as well.   Clinical Impression  Patient demonstrates slow labored movement for sitting up at bedside, incontinent of stool, fair/goo return for rolling side to side in bed, very unsteady on feet, has to lean on armrest of chair/BSC during transfers and tolerated sitting up on [facility] to finish bowel movement with her sister present in room - nursing staff notified.  Patient will benefit from continued skilled physical therapy in hospital and recommended venue below to increase strength, balance, endurance for safe ADLs and gait.          If plan is discharge home, recommend the following: A lot of help with walking and/or transfers;Help with stairs or ramp for entrance;Assistance with cooking/housework;A lot of help with bathing/dressing/bathroom   Can travel by private vehicle        Equipment Recommendations None recommended by PT  Recommendations for Other Services       Functional Status Assessment Patient has had a recent decline in their functional status and demonstrates the ability to make significant improvements in function in a reasonable and predictable amount of time.     Precautions / Restrictions Precautions Precautions:  Fall Restrictions Weight Bearing Restrictions: No      Mobility  Bed Mobility Overal bed mobility: Needs Assistance Bed Mobility: Supine to Sit     Supine to sit: Min assist, Mod assist     General bed mobility comments: increased time, labored movement    Transfers Overall transfer level: Needs assistance Equipment used: 1 person hand held assist, None Transfers: Sit to/from Stand, Bed to chair/wheelchair/BSC Sit to Stand: Min assist   Step pivot transfers: Min assist, Mod assist       General transfer comment: had to lean on armrest of chair and BSC during step pivo transfers    Ambulation/Gait Ambulation/Gait assistance: Mod assist, Max assist Gait Distance (Feet): 3 Feet Assistive device: 1 person hand held assist Gait Pattern/deviations: Decreased step length - right, Decreased step length - left, Decreased stride length, Trunk flexed Gait velocity: slow     General Gait Details: limited to a few side steps while leaning on armrest of chair before having to sit due to c/o fatigue and weakness  Stairs            Wheelchair Mobility     Tilt Bed    Modified Rankin (Stroke Patients Only)       Balance Overall balance assessment: Needs assistance Sitting-balance support: Feet supported, No upper extremity supported Sitting balance-Leahy Scale: Fair Sitting balance - Comments: fair/good seated at EOB   Standing balance support: Reliant on assistive device for balance, During functional activity, Single extremity supported Standing balance-Leahy Scale: Poor Standing balance comment: leaning on arm rest of chair and BSC  Pertinent Vitals/Pain Pain Assessment Pain Assessment: No/denies pain    Home Living Family/patient expects to be discharged to:: Assisted living                 Home Equipment: Wheelchair - manual      Prior Function Prior Level of Function : Needs assist       Physical  Assist : Mobility (physical);ADLs (physical) Mobility (physical): Bed mobility;Transfers;Gait   Mobility Comments: Supervised transfers to wheelchair, uses wheelchair for mobility, was walking until right ankle fracture a year ago, "per patient" ADLs Comments: Assisted by ALF staff     Extremity/Trunk Assessment   Upper Extremity Assessment Upper Extremity Assessment: Overall WFL for tasks assessed    Lower Extremity Assessment Lower Extremity Assessment: Generalized weakness    Cervical / Trunk Assessment Cervical / Trunk Assessment: Kyphotic  Communication   Communication Communication: No apparent difficulties;Other (comment) (visually impared) Cueing Techniques: Verbal cues;Tactile cues  Cognition Arousal: Alert Behavior During Therapy: WFL for tasks assessed/performed Overall Cognitive Status: Within Functional Limits for tasks assessed                                          General Comments      Exercises     Assessment/Plan    PT Assessment Patient needs continued PT services  PT Problem List Decreased strength;Decreased activity tolerance;Decreased balance;Decreased mobility       PT Treatment Interventions DME instruction;Gait training;Functional mobility training;Therapeutic activities;Therapeutic exercise;Balance training;Wheelchair mobility training;Patient/family education    PT Goals (Current goals can be found in the Care Plan section)  Acute Rehab PT Goals Patient Stated Goal: return home with ALF staff to assist PT Goal Formulation: With patient/family Time For Goal Achievement: 03/01/23 Potential to Achieve Goals: Good    Frequency Min 3X/week     Co-evaluation               AM-PAC PT "6 Clicks" Mobility  Outcome Measure Help needed turning from your back to your side while in a flat bed without using bedrails?: A Little Help needed moving from lying on your back to sitting on the side of a flat bed without using  bedrails?: A Lot Help needed moving to and from a bed to a chair (including a wheelchair)?: A Lot Help needed standing up from a chair using your arms (e.g., wheelchair or bedside chair)?: A Little Help needed to walk in hospital room?: A Lot Help needed climbing 3-5 steps with a railing? : Total 6 Click Score: 13    End of Session   Activity Tolerance: Patient tolerated treatment well;Patient limited by fatigue Patient left: Other (comment);with call bell/phone within reach;with family/visitor present (left sitting on [facility]) Nurse Communication: Mobility status PT Visit Diagnosis: Unsteadiness on feet (R26.81);Other abnormalities of gait and mobility (R26.89);Muscle weakness (generalized) (M62.81)    Time: 1010-1041 PT Time Calculation (min) (ACUTE ONLY): 31 min   Charges:   PT Evaluation $PT Eval Moderate Complexity: 1 Mod PT Treatments $Therapeutic Activity: 23-37 mins PT General Charges $$ ACUTE PT VISIT: 1 Visit         2:52 PM, 02/25/23 Ocie Bob, MPT Physical Therapist with [facility] [phone] office [phone] mobile phone

## 2023-02-26 ENCOUNTER — Inpatient Hospital Stay (HOSPITAL_COMMUNITY): Payer: Medicare Other

## 2023-02-26 DIAGNOSIS — R4182 Altered mental status, unspecified: Secondary | ICD-10-CM | POA: Diagnosis not present

## 2023-02-26 DIAGNOSIS — H548 Legal blindness, as defined in USA: Secondary | ICD-10-CM | POA: Diagnosis not present

## 2023-02-26 DIAGNOSIS — J441 Chronic obstructive pulmonary disease with (acute) exacerbation: Secondary | ICD-10-CM | POA: Diagnosis not present

## 2023-02-26 DIAGNOSIS — E039 Hypothyroidism, unspecified: Secondary | ICD-10-CM | POA: Diagnosis not present

## 2023-02-26 LAB — URINE CULTURE: Culture: NO GROWTH

## 2023-02-26 LAB — BASIC METABOLIC PANEL
Anion gap: 9 (ref 5–15)
BUN: 16 mg/dL (ref 8–23)
CO2: 26 mmol/L (ref 22–32)
Calcium: 8.3 mg/dL — ABNORMAL LOW (ref 8.9–10.3)
Chloride: 102 mmol/L (ref 98–111)
Creatinine, Ser: 0.75 mg/dL (ref 0.44–1.00)
GFR, Estimated: >60 mL/min (ref >60)
Glucose, Bld: 143 mg/dL — ABNORMAL HIGH (ref 70–99)
Potassium: 3.6 mmol/L (ref 3.5–5.1)
Sodium: 137 mmol/L (ref 135–145)

## 2023-02-26 LAB — CBC
HCT: 31.8 % — ABNORMAL LOW (ref 36.0–46.0)
Hemoglobin: 10.2 g/dL — ABNORMAL LOW (ref 12.0–15.0)
MCH: 35.8 pg — ABNORMAL HIGH (ref 26.0–34.0)
MCHC: 32.1 g/dL (ref 30.0–36.0)
MCV: 111.6 fL — ABNORMAL HIGH (ref 80.0–100.0)
Platelets: 168 10*3/uL (ref 150–400)
RBC: 2.85 MIL/uL — ABNORMAL LOW (ref 3.87–5.11)
RDW: 17 % — ABNORMAL HIGH (ref 11.5–15.5)
WBC: 4.1 10*3/uL (ref 4.0–10.5)
nRBC: 0 % (ref 0.0–0.2)

## 2023-02-26 LAB — GLUCOSE, CAPILLARY
Glucose-Capillary: 193 mg/dL — ABNORMAL HIGH (ref 70–99)
Glucose-Capillary: 119 mg/dL — ABNORMAL HIGH (ref 70–99)
Glucose-Capillary: 167 mg/dL — ABNORMAL HIGH (ref 70–99)
Glucose-Capillary: 220 mg/dL — ABNORMAL HIGH (ref 70–99)
Glucose-Capillary: 248 mg/dL — ABNORMAL HIGH (ref 70–99)

## 2023-02-26 LAB — RPR: RPR Ser Ql: NONREACTIVE

## 2023-02-26 LAB — LACTIC ACID, PLASMA
Lactic Acid, Venous: 2.3 mmol/L (ref 0.5–1.9)
Lactic Acid, Venous: 3.4 mmol/L (ref 0.5–1.9)

## 2023-02-26 LAB — HEMOGLOBIN A1C
Hgb A1c MFr Bld: 8.4 % — ABNORMAL HIGH (ref 4.8–5.6)
Mean Plasma Glucose: 194 mg/dL

## 2023-02-26 MED ORDER — POLYETHYLENE GLYCOL 3350 17 G PO PACK: 17.0000 g | PACK | Freq: Every day | ORAL | Status: DC | PRN

## 2023-02-26 MED ORDER — GUAIFENESIN ER 600 MG PO TB12
1200.0000 mg | ORAL_TABLET | Freq: Two times a day (BID) | ORAL | Status: DC
  Administered 2023-02-26 – 2023-03-01 (×6): 1200 mg via ORAL
  Filled 2023-02-26 (×6): qty 2

## 2023-02-26 MED ORDER — HYDROCOD POLI-CHLORPHE POLI ER 10-8 MG/5ML PO SUER
5.0000 mL | Freq: Two times a day (BID) | ORAL | Status: DC | PRN
  Administered 2023-02-26 – 2023-03-01 (×4): 5 mL via ORAL
  Filled 2023-02-26 (×4): qty 5

## 2023-02-26 MED ORDER — DM-GUAIFENESIN ER 30-600 MG PO TB12
2.0000 | ORAL_TABLET | Freq: Two times a day (BID) | ORAL | Status: DC | PRN
  Administered 2023-02-26: 2 via ORAL
  Filled 2023-02-26: qty 2

## 2023-02-26 MED ORDER — POTASSIUM CHLORIDE CRYS ER 20 MEQ PO TBCR
40.0000 meq | EXTENDED_RELEASE_TABLET | Freq: Once | ORAL | Status: AC
  Administered 2023-02-26: 40 meq via ORAL
  Filled 2023-02-26: qty 2

## 2023-02-26 MED ORDER — BENZONATATE 100 MG PO CAPS: 100.0000 mg | ORAL_CAPSULE | Freq: Three times a day (TID) | ORAL | Status: DC | PRN

## 2023-02-26 MED ORDER — INSULIN ASPART 100 UNIT/ML IJ SOLN: 6.0000 [IU] | Freq: Three times a day (TID) | INTRAMUSCULAR | Status: DC

## 2023-02-26 MED ORDER — FUROSEMIDE 10 MG/ML IJ SOLN
30.0000 mg | Freq: Once | INTRAMUSCULAR | Status: AC
  Administered 2023-02-26: 30 mg via INTRAVENOUS
  Filled 2023-02-26: qty 4

## 2023-02-26 NOTE — Progress Notes (Signed)
Date and time results received: 02/26/23 0755  Test: Lactic Acid Critical Value: 2.3  Name of Provider Notified: Laural Benes, Salena Saner  Orders Received? Or Actions Taken?: Awaiting response

## 2023-02-26 NOTE — TOC Progression Note (Signed)
Transition of Care [facility]) - Progression Note    Patient Details  Name: ZORAYA FIORENZA MRN: [id number] Date of Birth: 1945-09-01  Transition of Care [facility]) CM/SW Contact  Catalina Gravel, [place] Phone Number: 02/26/2023, 1:25 PM  Clinical Narrative:     CSW contcated Soil scientist at ALF to determine if in house PT or TOC should assist.  Stated CSW should coordinate.  CSW contacted [facility] regarding PT recommendation, they declined.  CSW was able to match pt with [facility]. [facility] added to AVS. TOC to follow. DC expected Monday.    Expected Discharge Plan: Home/Self Care Barriers to Discharge: Continued Medical Work up  Expected Discharge Plan and Services       Living arrangements for the past 2 months: Assisted Living Facility                                       Social Determinants of Health (SDOH) Interventions SDOH Screenings   Food Insecurity: No Food Insecurity (09/24/2022)  Housing: Low Risk  (09/24/2022)  Transportation Needs: No Transportation Needs (09/24/2022)  Utilities: Not At Risk (09/24/2022)  Tobacco Use: Medium Risk (02/24/2023)  Health Literacy: High Risk (09/08/2020)   Received from [facility], [facility]    Readmission Risk Interventions    02/25/2023   11:04 AM 09/23/2022    2:48 PM  Readmission Risk Prevention Plan  Transportation Screening Complete Complete  PCP or Specialist Appt within 3-5 Days Not Complete   HRI or Home Care Consult Complete Complete  Social Work Consult for Recovery Care Planning/Counseling Complete Complete  Palliative Care Screening Not Applicable Not Applicable  Medication Review Oceanographer) Complete Complete

## 2023-02-26 NOTE — Progress Notes (Signed)
Patient slept some during the night, Continues to have a strong, dry cough. Prn medication given. Mentation varies, alert to self situation at times. Plan of care ongoing.

## 2023-02-26 NOTE — Progress Notes (Signed)
PROGRESS NOTE   [name]  WGN:[id number] DOB: 01-16-1946 DOA: 02/24/2023 PCP: Galvin Proffer, MD   Chief Complaint  Patient presents with   Altered Mental Status   Level of care: Telemetry  Brief Admission History:  77 y.o. female,  with a history of COPD, hypertension, hyperlipidemia, hypothyroidism, CAD s/p stent placement, GERD, diabetes mellitus type 2, blindness, obesity. Pt is SNF resident, she was brought by the facility for intermittent confusion, this has been recurrent over last few weeks per her family, but has much worsened over the last week, patient has been having cough since Monday, some phlegm present, so she was sent for further evaluation, she herself denies any chest pain, dyspnea, but she reports cough, phlegm, she denies any urinary symptoms, she is with known history of COPD as well. -In ED patient was febrile 100.5, initially confused, but mentation much improved, chest x-ray with no acute findings, UA still pending, as were significant for lactic acid of 2.6, white blood cell count of 2.7, [facility] hospitalist consulted to admit.   Assessment and Plan:  Sepsis presumably from HCAP - pt remains symptomatic - lactic acid rising - LR sepsis bolus protocol ordered - recheck lactic acid after bolus completed - continue IV antibiotics and supportive measures  COPD with acute exacerbation - continue current mgmt with steroids, bronchodilators, nebs  Paroxysmal Afib  - resumed home apixaban and metoprolol XL  Acute metabolic encephalopathy - mentation improving with supportive measures   Essential hypertension  - stable on home meds  Hypothyroidism - TSH  markedly elevated  - had no longer been taking thyroid replacement - follow up free T4 <0.25 - restart levothyroxine   Autoimmune hepatitis  - resumed prednisone and imuran  GERD - pantoprazole for GI protection  DM type 2 with hyperglycemia  - resumed SSI coverage, prandial and basal  coverage - check CBG 5 times per day CBG (last 3)  Recent Labs    02/26/23 0745 02/26/23 1122 02/26/23 1644  GLUCAP 119* 167* 220*   DVT prophylaxis: apixaban Code Status: Full  Family Communication: sister at bedside 9/28 Disposition: return SNF when medically stable    Consultants:   Procedures:   Antimicrobials:    Subjective: Course cough, chest congestion, some wheezing, mostly nonproductive cough;   Objective: Vitals:   02/26/23 0300 02/26/23 0827 02/26/23 1339 02/26/23 1349  BP: (!) 149/68  (!) 161/91   Pulse: 68  62   Resp: 20     Temp: 98.3 F (36.8 C)  98.5 F (36.9 C)   TempSrc: Oral  Oral   SpO2: 100% 93% 100% 94%  Weight:      Height:        Intake/Output Summary (Last 24 hours) at 02/26/2023 1826 Last data filed at 02/26/2023 1759 Gross per 24 hour  Intake 650 ml  Output 600 ml  Net 50 ml   Filed Weights   02/24/23 1350 02/24/23 2054  Weight: 79.8 kg 82.9 kg   Examination:  General exam: Appears calm and comfortable  Respiratory system: rales heard bilateral with expiratory wheezing.  Cardiovascular system: normal S1 & S2 heard. No JVD, murmurs, rubs, gallops or clicks. No pedal edema. Gastrointestinal system: Abdomen is nondistended, soft and nontender. No organomegaly or masses felt. Normal bowel sounds heard. Central nervous system: Alert and oriented. No focal neurological deficits. Extremities: Symmetric 5 x 5 power. Skin: No rashes, lesions or ulcers. Psychiatry: Judgement and insight appear normal. Mood & affect appropriate.   Data Reviewed:  I have personally reviewed following labs and imaging studies  CBC: Recent Labs  Lab 02/24/23 1514 02/25/23 0444 02/26/23 0423  WBC 2.7* 3.0* 4.1  NEUTROABS 2.0  --   --   HGB 10.9* 10.7* 10.2*  HCT 32.5* 33.6* 31.8*  MCV 109.1* 110.9* 111.6*  PLT 159 154 168    Basic Metabolic Panel: Recent Labs  Lab 02/24/23 1514 02/25/23 0444 02/26/23 0423  NA 134* 132* 137  K 4.3 4.1 3.6   CL 98 99 102  CO2 25 23 26   GLUCOSE 109* 286* 143*  BUN 16 17 16   CREATININE 0.82 0.71 0.75  CALCIUM 8.6* 8.2* 8.3*    CBG: Recent Labs  Lab 02/25/23 2043 02/26/23 0304 02/26/23 0745 02/26/23 1122 02/26/23 1644  GLUCAP 329* 193* 119* 167* 220*    Recent Results (from the past 240 hour(s))  Culture, blood (Routine x 2)     Status: None (Preliminary result)   Collection Time: 02/24/23  2:18 PM   Specimen: BLOOD  Result Value Ref Range Status   Specimen Description BLOOD BLOOD RIGHT ARM  Final   Special Requests   Final    BOTTLES DRAWN AEROBIC AND ANAEROBIC Blood Culture results may not be optimal due to an excessive volume of blood received in culture bottles   Culture   Final    NO GROWTH 2 DAYS Performed at [facility], [address] [place], [place] [address]    Report Status PENDING  Incomplete  Resp panel by RT-PCR (RSV, Flu A&B, Covid) Anterior Nasal Swab     Status: None   Collection Time: 02/24/23  2:40 PM   Specimen: Anterior Nasal Swab  Result Value Ref Range Status   SARS Coronavirus 2 by RT PCR NEGATIVE NEGATIVE Final    Comment: (NOTE) SARS-CoV-2 target nucleic acids are NOT DETECTED.  The SARS-CoV-2 RNA is generally detectable in upper respiratory specimens during the acute phase of infection. The lowest concentration of SARS-CoV-2 viral copies this assay can detect is 138 copies/mL. A negative result does not preclude SARS-Cov-2 infection and should not be used as the sole basis for treatment or other patient management decisions. A negative result may occur with  improper specimen collection/handling, submission of specimen other than nasopharyngeal swab, presence of viral mutation(s) within the areas targeted by this assay, and inadequate number of viral copies(<138 copies/mL). A negative result must be combined with clinical observations, patient history, and epidemiological information. The expected result is Negative.  Fact Sheet for  Patients:  BloggerCourse.com  Fact Sheet for Healthcare Providers:  SeriousBroker.it  This test is no t yet approved or cleared by the [place] FDA and  has been authorized for detection and/or diagnosis of SARS-CoV-2 by FDA under an Emergency Use Authorization (EUA). This EUA will remain  in effect (meaning this test can be used) for the duration of the COVID-19 declaration under Section 564(b)(1) of the Act, 21 U.S.C.section 360bbb-3(b)(1), unless the authorization is terminated  or revoked sooner.       Influenza A by PCR NEGATIVE NEGATIVE Final   Influenza B by PCR NEGATIVE NEGATIVE Final    Comment: (NOTE) The Xpert Xpress SARS-CoV-2/FLU/RSV plus assay is intended as an aid in the diagnosis of influenza from Nasopharyngeal swab specimens and should not be used as a sole basis for treatment. Nasal washings and aspirates are unacceptable for Xpert Xpress SARS-CoV-2/FLU/RSV testing.  Fact Sheet for Patients: BloggerCourse.com  Fact Sheet for Healthcare Providers: SeriousBroker.it  This test is not yet  approved or cleared by the [place] and has been authorized for detection and/or diagnosis of SARS-CoV-2 by FDA under an Emergency Use Authorization (EUA). This EUA will remain in effect (meaning this test can be used) for the duration of the COVID-19 declaration under Section 564(b)(1) of the Act, 21 U.S.C. section 360bbb-3(b)(1), unless the authorization is terminated or revoked.     Resp Syncytial Virus by PCR NEGATIVE NEGATIVE Final    Comment: (NOTE) Fact Sheet for Patients: BloggerCourse.com  Fact Sheet for Healthcare Providers: SeriousBroker.it  This test is not yet approved or cleared by the [place] FDA and has been authorized for detection and/or diagnosis of SARS-CoV-2 by FDA under an Emergency  Use Authorization (EUA). This EUA will remain in effect (meaning this test can be used) for the duration of the COVID-19 declaration under Section 564(b)(1) of the Act, 21 U.S.C. section 360bbb-3(b)(1), unless the authorization is terminated or revoked.  Performed at [facility], [address], [place], [place] [address]   Culture, blood (Routine x 2)     Status: None (Preliminary result)   Collection Time: 02/24/23  3:14 PM   Specimen: BLOOD  Result Value Ref Range Status   Specimen Description BLOOD BLOOD RIGHT ARM  Final   Special Requests   Final    BOTTLES DRAWN AEROBIC AND ANAEROBIC Blood Culture adequate volume   Culture   Final    NO GROWTH 2 DAYS Performed at [facility], [address], [place], [place] [address]    Report Status PENDING  Incomplete  Respiratory (~20 pathogens) panel by PCR     Status: None   Collection Time: 02/24/23  5:27 PM   Specimen: Nasopharyngeal Swab; Respiratory  Result Value Ref Range Status   Adenovirus NOT DETECTED NOT DETECTED Final   Coronavirus 229E NOT DETECTED NOT DETECTED Final    Comment: (NOTE) The Coronavirus on the Respiratory Panel, DOES NOT test for the novel  Coronavirus (2019 nCoV)    Coronavirus HKU1 NOT DETECTED NOT DETECTED Final   Coronavirus NL63 NOT DETECTED NOT DETECTED Final   Coronavirus OC43 NOT DETECTED NOT DETECTED Final   Metapneumovirus NOT DETECTED NOT DETECTED Final   Rhinovirus / Enterovirus NOT DETECTED NOT DETECTED Final   Influenza A NOT DETECTED NOT DETECTED Final   Influenza B NOT DETECTED NOT DETECTED Final   Parainfluenza Virus 1 NOT DETECTED NOT DETECTED Final   Parainfluenza Virus 2 NOT DETECTED NOT DETECTED Final   Parainfluenza Virus 3 NOT DETECTED NOT DETECTED Final   Parainfluenza Virus 4 NOT DETECTED NOT DETECTED Final   Respiratory Syncytial Virus NOT DETECTED NOT DETECTED Final   Bordetella pertussis NOT DETECTED NOT DETECTED Final   Bordetella Parapertussis NOT DETECTED NOT DETECTED  Final   Chlamydophila pneumoniae NOT DETECTED NOT DETECTED Final   Mycoplasma pneumoniae NOT DETECTED NOT DETECTED Final    Comment: Performed at [facility], [address], [place], [place] [address]  Urine Culture     Status: None   Collection Time: 02/24/23  9:30 PM   Specimen: Urine, Random  Result Value Ref Range Status   Specimen Description   Final    URINE, RANDOM Performed at [facility], [address] [place], [place] [address]    Special Requests   Final    NONE Reflexed from [id number] Performed at [facility], [address], [place], [place] [address]    Culture   Final    NO GROWTH Performed at [facility],  [address] [place], [place] [address]    Report Status 02/26/2023 FINAL  Final  MRSA Next Gen by PCR, Nasal     Status: None   Collection Time: 02/25/23 11:33 AM   Specimen: Nasal Mucosa; Nasal Swab  Result Value Ref Range Status   MRSA by PCR Next Gen NOT DETECTED NOT DETECTED Final    Comment: (NOTE) The GeneXpert MRSA Assay (FDA approved for NASAL specimens only), is one component of a comprehensive MRSA colonization surveillance program. It is not intended to diagnose MRSA infection nor to guide or monitor treatment for MRSA infections. Test performance is not FDA approved in patients less than 28 years old. Performed at [facility], [address], [place], [place] [address]      Radiology Studies: DG CHEST PORT 1 VIEW  Result Date: 02/26/2023 CLINICAL DATA:  Pneumonia EXAM: PORTABLE CHEST 1 VIEW COMPARISON:  Chest radiograph dated 02/24/2023. FINDINGS: The heart is enlarged. Vascular calcifications are seen in the aortic arch. Mild bilateral lower lung predominant interstitial and airspace opacities. No pleural effusion or pneumothorax. Degenerative changes are seen in the spine. IMPRESSION: Mild bilateral lower lung predominant interstitial and airspace opacities may represent pulmonary edema or pneumonia. Electronically Signed   By:  Romona Curls M.D.   On: 02/26/2023 10:01    Scheduled Meds:  amitriptyline  50 mg Oral QHS   apixaban  5 mg Oral BID   azaTHIOprine  150 mg Oral Daily   azithromycin  500 mg Oral Daily   cholecalciferol  2,000 Units Oral Q0600   cyanocobalamin  1,000 mcg Subcutaneous Daily   ezetimibe  10 mg Oral Daily   furosemide  30 mg Intravenous Once   guaiFENesin  1,200 mg Oral BID   insulin aspart  0-20 Units Subcutaneous TID WC   insulin aspart  0-5 Units Subcutaneous QHS   [START ON 02/27/2023] insulin aspart  6 Units Subcutaneous TID WC   insulin glargine-yfgn  15 Units Subcutaneous Daily   ipratropium-albuterol  3 mL Nebulization TID   levothyroxine  100 mcg Oral QAC breakfast   lidocaine  1 patch Transdermal Daily   loratadine  10 mg Oral Daily   magnesium oxide  400 mg Oral Daily   melatonin  6 mg Oral QHS   metoprolol tartrate  50 mg Oral BID   potassium chloride  40 mEq Oral Once   predniSONE  40 mg Oral Q breakfast   Followed by   Melene Muller ON 02/28/2023] predniSONE  10 mg Oral Q breakfast   sertraline  25 mg Oral Daily   Continuous Infusions:  sodium chloride 75 mL/hr at 02/26/23 1747   cefTRIAXone (ROCEPHIN)  IV 2 g (02/26/23 1013)     LOS: 2 days   Time spent: 51 mins  Harith Mccadden Laural Benes, MD How to contact the [facility] Attending or Consulting provider 7A - 7P or covering provider during after hours 7P -7A, for this patient?  Check the care team in [facility] and look for a) attending/consulting TRH provider listed and b) the [facility] team listed Log into [email or web address] and use [facility] universal password to access. If you do not have the password, please contact the hospital operator. Locate the [facility] provider you are looking for under [facility] and page to a number that you can be directly reached. If you still have difficulty reaching the provider, please page the [facility] (Director on Call) for the Hospitalists listed on amion for assistance.  02/26/2023, 6:26 PM

## 2023-02-26 NOTE — Progress Notes (Signed)
   02/26/23 0059  Provider Notification  Provider Name/Title Dr. Victorino Dike  Date Provider Notified 02/26/23  Time Provider Notified [phone]  Notification Reason Critical Result  Test performed and critical result lactic 3.4  Date Critical Result Received 02/26/23  Time Critical Result Received 0055  Provider response See new orders  Date of Provider Response 02/26/23

## 2023-02-26 NOTE — Plan of Care (Signed)
  Problem: Education: Goal: Knowledge of General Education information will improve Description: Including pain rating scale, medication(s)/side effects and non-pharmacologic comfort measures Outcome: Progressing   Problem: Health Behavior/Discharge Planning: Goal: Ability to manage health-related needs will improve Outcome: Progressing   Problem: Clinical Measurements: Goal: Ability to maintain clinical measurements within normal limits will improve Outcome: Progressing Goal: Will remain free from infection Outcome: Progressing Goal: Diagnostic test results will improve Outcome: Progressing Goal: Respiratory complications will improve Outcome: Progressing Goal: Cardiovascular complication will be avoided Outcome: Progressing   Problem: Activity: Goal: Risk for activity intolerance will decrease Outcome: Progressing   Problem: Nutrition: Goal: Adequate nutrition will be maintained Outcome: Progressing   Problem: Coping: Goal: Level of anxiety will decrease Outcome: Progressing   Problem: Elimination: Goal: Will not experience complications related to bowel motility Outcome: Progressing Goal: Will not experience complications related to urinary retention Outcome: Progressing   Problem: Pain Managment: Goal: General experience of comfort will improve Outcome: Progressing   Problem: Safety: Goal: Ability to remain free from injury will improve Outcome: Progressing   Problem: Skin Integrity: Goal: Risk for impaired skin integrity will decrease Outcome: Progressing   Problem: Education: Goal: Ability to describe self-care measures that may prevent or decrease complications (Diabetes Survival Skills Education) will improve Outcome: Progressing Goal: Individualized Educational Video(s) Outcome: Progressing   Problem: Coping: Goal: Ability to adjust to condition or change in health will improve Outcome: Progressing   Problem: Fluid Volume: Goal: Ability to  maintain a balanced intake and output will improve Outcome: Progressing   Problem: Health Behavior/Discharge Planning: Goal: Ability to identify and utilize available resources and services will improve Outcome: Progressing Goal: Ability to manage health-related needs will improve Outcome: Progressing   Problem: Metabolic: Goal: Ability to maintain appropriate glucose levels will improve Outcome: Progressing   Problem: Nutritional: Goal: Maintenance of adequate nutrition will improve Outcome: Progressing   Problem: Skin Integrity: Goal: Risk for impaired skin integrity will decrease Outcome: Progressing   Problem: Tissue Perfusion: Goal: Adequacy of tissue perfusion will improve Outcome: Progressing   Problem: Fluid Volume: Goal: Hemodynamic stability will improve Outcome: Progressing   Problem: Clinical Measurements: Goal: Diagnostic test results will improve Outcome: Progressing Goal: Signs and symptoms of infection will decrease Outcome: Progressing   Problem: Respiratory: Goal: Ability to maintain adequate ventilation will improve Outcome: Progressing

## 2023-02-27 DIAGNOSIS — E039 Hypothyroidism, unspecified: Secondary | ICD-10-CM | POA: Diagnosis not present

## 2023-02-27 DIAGNOSIS — H548 Legal blindness, as defined in USA: Secondary | ICD-10-CM | POA: Diagnosis not present

## 2023-02-27 DIAGNOSIS — J441 Chronic obstructive pulmonary disease with (acute) exacerbation: Secondary | ICD-10-CM | POA: Diagnosis not present

## 2023-02-27 DIAGNOSIS — R4182 Altered mental status, unspecified: Secondary | ICD-10-CM | POA: Diagnosis not present

## 2023-02-27 LAB — GLUCOSE, CAPILLARY
Glucose-Capillary: 73 mg/dL (ref 70–99)
Glucose-Capillary: 54 mg/dL — ABNORMAL LOW (ref 70–99)
Glucose-Capillary: 56 mg/dL — ABNORMAL LOW (ref 70–99)
Glucose-Capillary: 67 mg/dL — ABNORMAL LOW (ref 70–99)
Glucose-Capillary: 66 mg/dL — ABNORMAL LOW (ref 70–99)
Glucose-Capillary: 95 mg/dL (ref 70–99)
Glucose-Capillary: 156 mg/dL — ABNORMAL HIGH (ref 70–99)
Glucose-Capillary: 278 mg/dL — ABNORMAL HIGH (ref 70–99)
Glucose-Capillary: 406 mg/dL — ABNORMAL HIGH (ref 70–99)

## 2023-02-27 LAB — MAGNESIUM: Magnesium: 1.7 mg/dL (ref 1.7–2.4)

## 2023-02-27 MED ORDER — INSULIN ASPART 100 UNIT/ML IJ SOLN
0.0000 [IU] | Freq: Three times a day (TID) | INTRAMUSCULAR | Status: DC
  Administered 2023-02-27: 8 [IU] via SUBCUTANEOUS
  Administered 2023-02-27 – 2023-03-01 (×4): 3 [IU] via SUBCUTANEOUS

## 2023-02-27 MED ORDER — INSULIN ASPART 100 UNIT/ML IJ SOLN
4.0000 [IU] | Freq: Three times a day (TID) | INTRAMUSCULAR | Status: DC
  Administered 2023-02-27 – 2023-03-01 (×7): 4 [IU] via SUBCUTANEOUS

## 2023-02-27 MED ORDER — AMLODIPINE BESYLATE 5 MG PO TABS
5.0000 mg | ORAL_TABLET | Freq: Every day | ORAL | Status: DC
  Administered 2023-02-27 – 2023-03-01 (×3): 5 mg via ORAL
  Filled 2023-02-27 (×3): qty 1

## 2023-02-27 MED ORDER — INSULIN GLARGINE-YFGN 100 UNIT/ML ~~LOC~~ SOLN
12.0000 [IU] | Freq: Every day | SUBCUTANEOUS | Status: DC
  Administered 2023-02-27 – 2023-03-01 (×3): 12 [IU] via SUBCUTANEOUS
  Filled 2023-02-27 (×4): qty 0.12

## 2023-02-27 NOTE — Progress Notes (Signed)
PROGRESS NOTE   [name]  LKG:[id number] DOB: Oct 02, 1945 DOA: 02/24/2023 PCP: Galvin Proffer, MD   Chief Complaint  Patient presents with   Altered Mental Status   Level of care: Telemetry  Brief Admission History:  77 y.o. female,  with a history of COPD, hypertension, hyperlipidemia, hypothyroidism, CAD s/p stent placement, GERD, diabetes mellitus type 2, blindness, obesity. Pt is SNF resident, she was brought by the facility for intermittent confusion, this has been recurrent over last few weeks per her family, but has much worsened over the last week, patient has been having cough since Monday, some phlegm present, so she was sent for further evaluation, she herself denies any chest pain, dyspnea, but she reports cough, phlegm, she denies any urinary symptoms, she is with known history of COPD as well. -In ED patient was febrile 100.5, initially confused, but mentation much improved, chest x-ray with no acute findings, UA still pending, as were significant for lactic acid of 2.6, white blood cell count of 2.7, [facility] hospitalist consulted to admit.   Assessment and Plan:  Sepsis presumably from HCAP - pt remains symptomatic - lactic acid trending back down - LR sepsis bolus protocol given - recheck lactic acid after bolus completed - continue IV antibiotics and supportive measures  COPD with acute exacerbation - continue current mgmt with steroids, bronchodilators, nebs  Paroxysmal Afib  - resumed home apixaban and metoprolol XL  Acute metabolic encephalopathy - mentation improving with supportive measures   Essential hypertension  - suboptimally controlled  - added amlodipine 5 mg daily   Hypothyroidism - TSH  markedly elevated  - had no longer been taking thyroid replacement - follow up free T4 <0.25 - restart levothyroxine   Autoimmune hepatitis  - resumed prednisone and imuran  GERD - pantoprazole for GI protection  DM type 2 with hyperglycemia  -  resumed SSI coverage, prandial and basal coverage - check CBG 5 times per day CBG (last 3)  Recent Labs    02/27/23 0818 02/27/23 0835 02/27/23 0906  GLUCAP 67* 66* 95   Hypoglycemia from insulin - reduced insulin doses  - continue frequent CBG monitoring   DVT prophylaxis: apixaban Code Status: Full  Family Communication: sister at bedside 9/28 Disposition: return SNF when medically stable    Consultants:   Procedures:   Antimicrobials:    Subjective: Cough starting to improve, much less SOB today, a little better.   Objective: Vitals:   02/26/23 2020 02/26/23 2127 02/27/23 0439 02/27/23 0835  BP: (!) 168/72 (!) 173/66 (!) 146/76   Pulse: 60 (!) 56 60   Resp: 20     Temp: (!) 97.5 F (36.4 C) 98.4 F (36.9 C) 97.7 F (36.5 C)   TempSrc: Oral Oral Axillary   SpO2: 100% 98% 99% 96%  Weight:      Height:        Intake/Output Summary (Last 24 hours) at 02/27/2023 1117 Last data filed at 02/27/2023 0834 Gross per 24 hour  Intake 910 ml  Output 2500 ml  Net -1590 ml   Filed Weights   02/24/23 1350 02/24/23 2054  Weight: 79.8 kg 82.9 kg   Examination:  General exam: Appears calm and comfortable  Respiratory system: bilateral rales heard with wheezing.   Cardiovascular system: normal S1 & S2 heard. No JVD, murmurs, rubs, gallops or clicks. No pedal edema. Gastrointestinal system: Abdomen is nondistended, soft and nontender. No organomegaly or masses felt. Normal bowel sounds heard. Central nervous system: Alert and  oriented. No focal neurological deficits. Extremities: Symmetric 5 x 5 power. Skin: No rashes, lesions or ulcers. Psychiatry: Judgement and insight appear normal. Mood & affect appropriate.   Data Reviewed: I have personally reviewed following labs and imaging studies  CBC: Recent Labs  Lab 02/24/23 1514 02/25/23 0444 02/26/23 0423  WBC 2.7* 3.0* 4.1  NEUTROABS 2.0  --   --   HGB 10.9* 10.7* 10.2*  HCT 32.5* 33.6* 31.8*  MCV 109.1*  110.9* 111.6*  PLT 159 154 168    Basic Metabolic Panel: Recent Labs  Lab 02/24/23 1514 02/25/23 0444 02/26/23 0423 02/27/23 0706  NA 134* 132* 137  --   K 4.3 4.1 3.6  --   CL 98 99 102  --   CO2 25 23 26   --   GLUCOSE 109* 286* 143*  --   BUN 16 17 16   --   CREATININE 0.82 0.71 0.75  --   CALCIUM 8.6* 8.2* 8.3*  --   MG  --   --   --  1.7    CBG: Recent Labs  Lab 02/27/23 0735 02/27/23 0756 02/27/23 0818 02/27/23 0835 02/27/23 0906  GLUCAP 54* 56* 67* 66* 95    Recent Results (from the past 240 hour(s))  Culture, blood (Routine x 2)     Status: None (Preliminary result)   Collection Time: 02/24/23  2:18 PM   Specimen: BLOOD  Result Value Ref Range Status   Specimen Description BLOOD BLOOD RIGHT ARM  Final   Special Requests   Final    BOTTLES DRAWN AEROBIC AND ANAEROBIC Blood Culture results may not be optimal due to an excessive volume of blood received in culture bottles   Culture   Final    NO GROWTH 3 DAYS Performed at [facility], [address], [place], [place] [address]    Report Status PENDING  Incomplete  Resp panel by RT-PCR (RSV, Flu A&B, Covid) Anterior Nasal Swab     Status: None   Collection Time: 02/24/23  2:40 PM   Specimen: Anterior Nasal Swab  Result Value Ref Range Status   SARS Coronavirus 2 by RT PCR NEGATIVE NEGATIVE Final    Comment: (NOTE) SARS-CoV-2 target nucleic acids are NOT DETECTED.  The SARS-CoV-2 RNA is generally detectable in upper respiratory specimens during the acute phase of infection. The lowest concentration of SARS-CoV-2 viral copies this assay can detect is 138 copies/mL. A negative result does not preclude SARS-Cov-2 infection and should not be used as the sole basis for treatment or other patient management decisions. A negative result may occur with  improper specimen collection/handling, submission of specimen other than nasopharyngeal swab, presence of viral mutation(s) within the areas targeted by this  assay, and inadequate number of viral copies(<138 copies/mL). A negative result must be combined with clinical observations, patient history, and epidemiological information. The expected result is Negative.  Fact Sheet for Patients:  BloggerCourse.com  Fact Sheet for Healthcare Providers:  SeriousBroker.it  This test is no t yet approved or cleared by the [place] FDA and  has been authorized for detection and/or diagnosis of SARS-CoV-2 by FDA under an Emergency Use Authorization (EUA). This EUA will remain  in effect (meaning this test can be used) for the duration of the COVID-19 declaration under Section 564(b)(1) of the Act, 21 U.S.C.section 360bbb-3(b)(1), unless the authorization is terminated  or revoked sooner.       Influenza A by PCR NEGATIVE NEGATIVE Final   Influenza B by PCR  NEGATIVE NEGATIVE Final    Comment: (NOTE) The Xpert Xpress SARS-CoV-2/FLU/RSV plus assay is intended as an aid in the diagnosis of influenza from Nasopharyngeal swab specimens and should not be used as a sole basis for treatment. Nasal washings and aspirates are unacceptable for Xpert Xpress SARS-CoV-2/FLU/RSV testing.  Fact Sheet for Patients: BloggerCourse.com  Fact Sheet for Healthcare Providers: SeriousBroker.it  This test is not yet approved or cleared by the [place] FDA and has been authorized for detection and/or diagnosis of SARS-CoV-2 by FDA under an Emergency Use Authorization (EUA). This EUA will remain in effect (meaning this test can be used) for the duration of the COVID-19 declaration under Section 564(b)(1) of the Act, 21 U.S.C. section 360bbb-3(b)(1), unless the authorization is terminated or revoked.     Resp Syncytial Virus by PCR NEGATIVE NEGATIVE Final    Comment: (NOTE) Fact Sheet for Patients: BloggerCourse.com  Fact Sheet for  Healthcare Providers: SeriousBroker.it  This test is not yet approved or cleared by the [place] FDA and has been authorized for detection and/or diagnosis of SARS-CoV-2 by FDA under an Emergency Use Authorization (EUA). This EUA will remain in effect (meaning this test can be used) for the duration of the COVID-19 declaration under Section 564(b)(1) of the Act, 21 U.S.C. section 360bbb-3(b)(1), unless the authorization is terminated or revoked.  Performed at [facility], [address] [place], [place] [address]   Culture, blood (Routine x 2)     Status: None (Preliminary result)   Collection Time: 02/24/23  3:14 PM   Specimen: BLOOD  Result Value Ref Range Status   Specimen Description BLOOD BLOOD RIGHT ARM  Final   Special Requests   Final    BOTTLES DRAWN AEROBIC AND ANAEROBIC Blood Culture adequate volume   Culture   Final    NO GROWTH 3 DAYS Performed at [facility], [address], [place], [place] [address]    Report Status PENDING  Incomplete  Respiratory (~20 pathogens) panel by PCR     Status: None   Collection Time: 02/24/23  5:27 PM   Specimen: Nasopharyngeal Swab; Respiratory  Result Value Ref Range Status   Adenovirus NOT DETECTED NOT DETECTED Final   Coronavirus 229E NOT DETECTED NOT DETECTED Final    Comment: (NOTE) The Coronavirus on the Respiratory Panel, DOES NOT test for the novel  Coronavirus (2019 nCoV)    Coronavirus HKU1 NOT DETECTED NOT DETECTED Final   Coronavirus NL63 NOT DETECTED NOT DETECTED Final   Coronavirus OC43 NOT DETECTED NOT DETECTED Final   Metapneumovirus NOT DETECTED NOT DETECTED Final   Rhinovirus / Enterovirus NOT DETECTED NOT DETECTED Final   Influenza A NOT DETECTED NOT DETECTED Final   Influenza B NOT DETECTED NOT DETECTED Final   Parainfluenza Virus 1 NOT DETECTED NOT DETECTED Final   Parainfluenza Virus 2 NOT DETECTED NOT DETECTED Final   Parainfluenza Virus 3 NOT DETECTED NOT DETECTED Final    Parainfluenza Virus 4 NOT DETECTED NOT DETECTED Final   Respiratory Syncytial Virus NOT DETECTED NOT DETECTED Final   Bordetella pertussis NOT DETECTED NOT DETECTED Final   Bordetella Parapertussis NOT DETECTED NOT DETECTED Final   Chlamydophila pneumoniae NOT DETECTED NOT DETECTED Final   Mycoplasma pneumoniae NOT DETECTED NOT DETECTED Final    Comment: Performed at [facility], [address], [place], [place] [address]  Urine Culture     Status: None   Collection Time: 02/24/23  9:30 PM   Specimen: Urine, Random  Result Value Ref  Range Status   Specimen Description   Final    URINE, RANDOM Performed at [facility], [address] [place], [place] [address]    Special Requests   Final    NONE Reflexed from [id number] Performed at [facility], [address], [place], [place] [address]    Culture   Final    NO GROWTH Performed at [facility], [address], [place], [place] [address]    Report Status 02/26/2023 FINAL  Final  MRSA Next Gen by PCR, Nasal     Status: None   Collection Time: 02/25/23 11:33 AM   Specimen: Nasal Mucosa; Nasal Swab  Result Value Ref Range Status   MRSA by PCR Next Gen NOT DETECTED NOT DETECTED Final    Comment: (NOTE) The GeneXpert MRSA Assay (FDA approved for NASAL specimens only), is one component of a comprehensive MRSA colonization surveillance program. It is not intended to diagnose MRSA infection nor to guide or monitor treatment for MRSA infections. Test performance is not FDA approved in patients less than 31 years old. Performed at [facility], [address], [place], [place] [address]      Radiology Studies: DG CHEST PORT 1 VIEW  Result Date: 02/26/2023 CLINICAL DATA:  Pneumonia EXAM: PORTABLE CHEST 1 VIEW COMPARISON:  Chest radiograph dated 02/24/2023. FINDINGS: The heart is enlarged. Vascular calcifications are seen in the aortic arch. Mild bilateral lower lung predominant interstitial and airspace opacities. No pleural  effusion or pneumothorax. Degenerative changes are seen in the spine. IMPRESSION: Mild bilateral lower lung predominant interstitial and airspace opacities may represent pulmonary edema or pneumonia. Electronically Signed   By: Romona Curls M.D.   On: 02/26/2023 10:01    Scheduled Meds:  amitriptyline  50 mg Oral QHS   apixaban  5 mg Oral BID   azaTHIOprine  150 mg Oral Daily   azithromycin  500 mg Oral Daily   cholecalciferol  2,000 Units Oral Q0600   cyanocobalamin  1,000 mcg Subcutaneous Daily   ezetimibe  10 mg Oral Daily   guaiFENesin  1,200 mg Oral BID   insulin aspart  0-15 Units Subcutaneous TID WC   insulin aspart  4 Units Subcutaneous TID WC   insulin glargine-yfgn  12 Units Subcutaneous Daily   ipratropium-albuterol  3 mL Nebulization TID   levothyroxine  100 mcg Oral QAC breakfast   lidocaine  1 patch Transdermal Daily   loratadine  10 mg Oral Daily   magnesium oxide  400 mg Oral Daily   melatonin  6 mg Oral QHS   metoprolol tartrate  50 mg Oral BID   [START ON 02/28/2023] predniSONE  10 mg Oral Q breakfast   sertraline  25 mg Oral Daily   Continuous Infusions:  sodium chloride 50 mL/hr at 02/27/23 1105   cefTRIAXone (ROCEPHIN)  IV 2 g (02/26/23 1013)     LOS: 3 days   Time spent: 48 mins  Diedre Maclellan Laural Benes, MD How to contact the [facility] Attending or Consulting provider 7A - 7P or covering provider during after hours 7P -7A, for this patient?  Check the care team in [facility] and look for a) attending/consulting TRH provider listed and b) the [facility] team listed Log into [email or web address] and use [facility]'s universal password to access. If you do not have the password, please contact the hospital operator. Locate the [facility] provider you are looking for under [facility] and page to a number that you can be directly reached. If you still have  difficulty reaching the provider, please page the [facility] (Director on Call) for the Hospitalists listed on amion for assistance.  02/27/2023,  11:17 AM

## 2023-02-27 NOTE — Progress Notes (Signed)
Pt's blood sugar low this am at 54 mg/dl. Pt alert, oriented to person & place, denies c/o. Skin warm and dry, color appropriate. Pt received 30 gm CHO snack/fld with no improvement in blood glucose after 15 minutes (resulted 56 mg/dl). Breakfast tray given and pt's blood sugar rechecked, resulted at 61 mg/dl. Pt orientation remains same, conversational with occasional word salad. Denies c/o. CHO 15gm given (juice).

## 2023-02-27 NOTE — TOC Progression Note (Signed)
Transition of Care [facility]) - Progression Note    Patient Details  Name: [name] MRN: [id number] Date of Birth: April 25, 1946  Transition of [facility]) CM/SW Contact  Catalina Gravel, [place] Phone Number: 02/27/2023, 4:38 PM  Clinical Narrative:    CSW referred pt to potential HHPT provider, room visit to discuss with pt, she was receiving a breathing tx.  Provider willing to visit ALF if pt accepts, and mental state more clear to respond.  TOC to follow.    Expected Discharge Plan: Home/Self Care Barriers to Discharge: Continued Medical Work up  Expected Discharge Plan and Services       Living arrangements for the past 2 months: Assisted Living Facility                                       Social Determinants of Health (SDOH) Interventions SDOH Screenings   Food Insecurity: No Food Insecurity (09/24/2022)  Housing: Low Risk  (09/24/2022)  Transportation Needs: No Transportation Needs (09/24/2022)  Utilities: Not At Risk (09/24/2022)  Tobacco Use: Medium Risk (02/24/2023)  Health Literacy: High Risk (09/08/2020)   Received from [facility], [facility]    Readmission Risk Interventions    02/25/2023   11:04 AM 09/23/2022    2:48 PM  Readmission Risk Prevention Plan  Transportation Screening Complete Complete  PCP or Specialist Appt within 3-5 Days Not Complete   HRI or Home Care Consult Complete Complete  Social Work Consult for Recovery Care Planning/Counseling Complete Complete  Palliative Care Screening Not Applicable Not Applicable  Medication Review Oceanographer) Complete Complete

## 2023-02-27 NOTE — Progress Notes (Signed)
Pt had episode of hypoglycemia this am requiring oral CHO admin x3. MD Laural Benes was notified and med orders changed. Pt with no other issues r/t CBG levels. Pt has tolerated oral food/fluids without diff or n/v. Pt with congested, productive cough, expectorating clear to tan colored mucous throughout the day. Ordered cough med controlling cough. VSS.

## 2023-02-27 NOTE — Progress Notes (Signed)
Patient slept some during the night. Patient has a strong cough that she states is " not moving anything". Alert to self. Prn medication given. Plan of care ongoing.

## 2023-02-27 NOTE — Progress Notes (Signed)
MD Laural Benes in to evaluate patient, updated on blood sugars and tx provided. Current CBG 95 mg/dl. MD states has updated med orders.

## 2023-02-28 ENCOUNTER — Other Ambulatory Visit: Payer: Self-pay

## 2023-02-28 DIAGNOSIS — G9341 Metabolic encephalopathy: Secondary | ICD-10-CM

## 2023-02-28 DIAGNOSIS — E2740 Unspecified adrenocortical insufficiency: Secondary | ICD-10-CM

## 2023-02-28 DIAGNOSIS — R4182 Altered mental status, unspecified: Secondary | ICD-10-CM | POA: Diagnosis not present

## 2023-02-28 DIAGNOSIS — E039 Hypothyroidism, unspecified: Secondary | ICD-10-CM | POA: Diagnosis not present

## 2023-02-28 DIAGNOSIS — I480 Paroxysmal atrial fibrillation: Secondary | ICD-10-CM

## 2023-02-28 DIAGNOSIS — H548 Legal blindness, as defined in USA: Secondary | ICD-10-CM | POA: Diagnosis not present

## 2023-02-28 LAB — GLUCOSE, CAPILLARY
Glucose-Capillary: 163 mg/dL — ABNORMAL HIGH (ref 70–99)
Glucose-Capillary: 104 mg/dL — ABNORMAL HIGH (ref 70–99)
Glucose-Capillary: 181 mg/dL — ABNORMAL HIGH (ref 70–99)
Glucose-Capillary: 198 mg/dL — ABNORMAL HIGH (ref 70–99)
Glucose-Capillary: 222 mg/dL — ABNORMAL HIGH (ref 70–99)

## 2023-02-28 LAB — BASIC METABOLIC PANEL
Anion gap: 7 (ref 5–15)
BUN: 13 mg/dL (ref 8–23)
CO2: 31 mmol/L (ref 22–32)
Calcium: 8.2 mg/dL — ABNORMAL LOW (ref 8.9–10.3)
Chloride: 97 mmol/L — ABNORMAL LOW (ref 98–111)
Creatinine, Ser: 0.73 mg/dL (ref 0.44–1.00)
GFR, Estimated: >60 mL/min (ref >60)
Glucose, Bld: 148 mg/dL — ABNORMAL HIGH (ref 70–99)
Potassium: 3.4 mmol/L — ABNORMAL LOW (ref 3.5–5.1)
Sodium: 135 mmol/L (ref 135–145)

## 2023-02-28 MED ORDER — INFLUENZA VAC A&B SURF ANT ADJ 0.5 ML IM SUSY: 0.5000 mL | PREFILLED_SYRINGE | INTRAMUSCULAR | Status: DC

## 2023-02-28 MED ORDER — HYDRALAZINE HCL 20 MG/ML IJ SOLN: 10.0000 mg | INTRAMUSCULAR | Status: DC | PRN

## 2023-02-28 MED ORDER — IPRATROPIUM-ALBUTEROL 0.5-2.5 (3) MG/3ML IN SOLN: 3.0000 mL | RESPIRATORY_TRACT | Status: DC | PRN

## 2023-02-28 NOTE — Progress Notes (Signed)
PROGRESS NOTE   [name]  FIE:[id number] DOB: 01-27-1946 DOA: 02/24/2023 PCP: Galvin Proffer, MD   Chief Complaint  Patient presents with   Altered Mental Status   Level of care: Telemetry  Brief Admission History:  77 y.o. female,  with a history of COPD, hypertension, hyperlipidemia, hypothyroidism, CAD s/p stent placement, GERD, diabetes mellitus type 2, blindness, obesity. Pt is SNF resident, she was brought by the facility for intermittent confusion, this has been recurrent over last few weeks per her family, but has much worsened over the last week, patient has been having cough since Monday, some phlegm present, so she was sent for further evaluation, she herself denies any chest pain, dyspnea, but she reports cough, phlegm, she denies any urinary symptoms, she is with known history of COPD as well. -In ED patient was febrile 100.5, initially confused, but mentation much improved, chest x-ray with no acute findings, UA still pending, as were significant for lactic acid of 2.6, white blood cell count of 2.7, [facility] hospitalist consulted to admit.   Assessment and Plan:  Sepsis presumably from HCAP - pt still having significant cough, chest congestion - lactic acid trending back down - LR sepsis bolus protocol given - recheck lactic acid after bolus completed - completing full 5 day course of IV antibiotics and supportive measures  COPD with acute exacerbation - continue current mgmt with steroids, bronchodilators, nebs  Paroxysmal Afib  - resumed home apixaban and metoprolol XL  Acute metabolic encephalopathy - mentation improving with supportive measures   Essential hypertension  - suboptimally controlled  - added amlodipine 5 mg daily   Hypothyroidism - TSH  markedly elevated  - had no longer been taking thyroid replacement - follow up free T4 <0.25 - restart levothyroxine   Autoimmune hepatitis  - resumed prednisone and imuran  GERD - pantoprazole for GI  protection  DM type 2 with hyperglycemia  - resumed SSI coverage, prandial and basal coverage - check CBG 5 times per day CBG (last 3)  Recent Labs    02/27/23 2010 02/28/23 0304 02/28/23 0743  GLUCAP 406* 163* 104*   Hypoglycemia from insulin - reduced insulin doses  - continue frequent CBG monitoring   DVT prophylaxis: apixaban Code Status: Full  Family Communication: sister at bedside 9/28 Disposition: anticipate DC 10/1    Consultants:   Procedures:   Antimicrobials:    Subjective: Cough starting to improve, much less SOB today, a little better.   Objective: Vitals:   02/27/23 2006 02/27/23 2021 02/28/23 0403 02/28/23 0914  BP: (!) 167/72  (!) 177/77   Pulse: (!) 58  (!) 53   Resp:      Temp: 98.5 F (36.9 C)  (!) 97.4 F (36.3 C)   TempSrc: Oral  Axillary   SpO2: 100% 95% 100% 96%  Weight:      Height:        Intake/Output Summary (Last 24 hours) at 02/28/2023 1119 Last data filed at 02/28/2023 0755 Gross per 24 hour  Intake 200 ml  Output 2950 ml  Net -2750 ml   Filed Weights   02/24/23 1350 02/24/23 2054  Weight: 79.8 kg 82.9 kg   Examination:  General exam: Appears calm and comfortable  Respiratory system: better air movement bilateral, barky cough with some wet sounds heard.   Cardiovascular system: normal S1 & S2 heard. No JVD, murmurs, rubs, gallops or clicks. No pedal edema. Gastrointestinal system: Abdomen is nondistended, soft and nontender. No organomegaly or masses  felt. Normal bowel sounds heard. Central nervous system: Alert and oriented. No focal neurological deficits. Extremities: Symmetric 5 x 5 power. Skin: No rashes, lesions or ulcers. Psychiatry: Judgement and insight appear normal. Mood & affect appropriate.   Data Reviewed: I have personally reviewed following labs and imaging studies  CBC: Recent Labs  Lab 02/24/23 1514 02/25/23 0444 02/26/23 0423  WBC 2.7* 3.0* 4.1  NEUTROABS 2.0  --   --   HGB 10.9* 10.7* 10.2*   HCT 32.5* 33.6* 31.8*  MCV 109.1* 110.9* 111.6*  PLT 159 154 168    Basic Metabolic Panel: Recent Labs  Lab 02/24/23 1514 02/25/23 0444 02/26/23 0423 02/27/23 0706 02/28/23 0446  NA 134* 132* 137  --  135  K 4.3 4.1 3.6  --  3.4*  CL 98 99 102  --  97*  CO2 25 23 26   --  31  GLUCOSE 109* 286* 143*  --  148*  BUN 16 17 16   --  13  CREATININE 0.82 0.71 0.75  --  0.73  CALCIUM 8.6* 8.2* 8.3*  --  8.2*  MG  --   --   --  1.7  --     CBG: Recent Labs  Lab 02/27/23 1135 02/27/23 1613 02/27/23 2010 02/28/23 0304 02/28/23 0743  GLUCAP 156* 278* 406* 163* 104*    Recent Results (from the past 240 hour(s))  Culture, blood (Routine x 2)     Status: None (Preliminary result)   Collection Time: 02/24/23  2:18 PM   Specimen: BLOOD  Result Value Ref Range Status   Specimen Description BLOOD BLOOD RIGHT ARM  Final   Special Requests   Final    BOTTLES DRAWN AEROBIC AND ANAEROBIC Blood Culture results may not be optimal due to an excessive volume of blood received in culture bottles   Culture   Final    NO GROWTH 4 DAYS Performed at [facility], [address] [place], [place] [address]    Report Status PENDING  Incomplete  Resp panel by RT-PCR (RSV, Flu A&B, Covid) Anterior Nasal Swab     Status: None   Collection Time: 02/24/23  2:40 PM   Specimen: Anterior Nasal Swab  Result Value Ref Range Status   SARS Coronavirus 2 by RT PCR NEGATIVE NEGATIVE Final    Comment: (NOTE) SARS-CoV-2 target nucleic acids are NOT DETECTED.  The SARS-CoV-2 RNA is generally detectable in upper respiratory specimens during the acute phase of infection. The lowest concentration of SARS-CoV-2 viral copies this assay can detect is 138 copies/mL. A negative result does not preclude SARS-Cov-2 infection and should not be used as the sole basis for treatment or other patient management decisions. A negative result may occur with  improper specimen collection/handling, submission of specimen  other than nasopharyngeal swab, presence of viral mutation(s) within the areas targeted by this assay, and inadequate number of viral copies(<138 copies/mL). A negative result must be combined with clinical observations, patient history, and epidemiological information. The expected result is Negative.  Fact Sheet for Patients:  BloggerCourse.com  Fact Sheet for Healthcare Providers:  SeriousBroker.it  This test is no t yet approved or cleared by the [place] FDA and  has been authorized for detection and/or diagnosis of SARS-CoV-2 by FDA under an Emergency Use Authorization (EUA). This EUA will remain  in effect (meaning this test can be used) for the duration of the COVID-19 declaration under Section 564(b)(1) of the Act, 21 U.S.C.section 360bbb-3(b)(1), unless the authorization is terminated  or revoked sooner.       Influenza A by PCR NEGATIVE NEGATIVE Final   Influenza B by PCR NEGATIVE NEGATIVE Final    Comment: (NOTE) The Xpert Xpress SARS-CoV-2/FLU/RSV plus assay is intended as an aid in the diagnosis of influenza from Nasopharyngeal swab specimens and should not be used as a sole basis for treatment. Nasal washings and aspirates are unacceptable for Xpert Xpress SARS-CoV-2/FLU/RSV testing.  Fact Sheet for Patients: BloggerCourse.com  Fact Sheet for Healthcare Providers: SeriousBroker.it  This test is not yet approved or cleared by the [place] FDA and has been authorized for detection and/or diagnosis of SARS-CoV-2 by FDA under an Emergency Use Authorization (EUA). This EUA will remain in effect (meaning this test can be used) for the duration of the COVID-19 declaration under Section 564(b)(1) of the Act, 21 U.S.C. section 360bbb-3(b)(1), unless the authorization is terminated or revoked.     Resp Syncytial Virus by PCR NEGATIVE NEGATIVE Final     Comment: (NOTE) Fact Sheet for Patients: BloggerCourse.com  Fact Sheet for Healthcare Providers: SeriousBroker.it  This test is not yet approved or cleared by the [place] FDA and has been authorized for detection and/or diagnosis of SARS-CoV-2 by FDA under an Emergency Use Authorization (EUA). This EUA will remain in effect (meaning this test can be used) for the duration of the COVID-19 declaration under Section 564(b)(1) of the Act, 21 U.S.C. section 360bbb-3(b)(1), unless the authorization is terminated or revoked.  Performed at [facility], [address] [place], [place] [address]   Culture, blood (Routine x 2)     Status: None (Preliminary result)   Collection Time: 02/24/23  3:14 PM   Specimen: BLOOD  Result Value Ref Range Status   Specimen Description BLOOD BLOOD RIGHT ARM  Final   Special Requests   Final    BOTTLES DRAWN AEROBIC AND ANAEROBIC Blood Culture adequate volume   Culture   Final    NO GROWTH 4 DAYS Performed at [facility], [address] [place], [place] [address]    Report Status PENDING  Incomplete  Respiratory (~20 pathogens) panel by PCR     Status: None   Collection Time: 02/24/23  5:27 PM   Specimen: Nasopharyngeal Swab; Respiratory  Result Value Ref Range Status   Adenovirus NOT DETECTED NOT DETECTED Final   Coronavirus 229E NOT DETECTED NOT DETECTED Final    Comment: (NOTE) The Coronavirus on the Respiratory Panel, DOES NOT test for the novel  Coronavirus (2019 nCoV)    Coronavirus HKU1 NOT DETECTED NOT DETECTED Final   Coronavirus NL63 NOT DETECTED NOT DETECTED Final   Coronavirus OC43 NOT DETECTED NOT DETECTED Final   Metapneumovirus NOT DETECTED NOT DETECTED Final   Rhinovirus / Enterovirus NOT DETECTED NOT DETECTED Final   Influenza A NOT DETECTED NOT DETECTED Final   Influenza B NOT DETECTED NOT DETECTED Final   Parainfluenza Virus 1 NOT DETECTED NOT DETECTED Final    Parainfluenza Virus 2 NOT DETECTED NOT DETECTED Final   Parainfluenza Virus 3 NOT DETECTED NOT DETECTED Final   Parainfluenza Virus 4 NOT DETECTED NOT DETECTED Final   Respiratory Syncytial Virus NOT DETECTED NOT DETECTED Final   Bordetella pertussis NOT DETECTED NOT DETECTED Final   Bordetella Parapertussis NOT DETECTED NOT DETECTED Final   Chlamydophila pneumoniae NOT DETECTED NOT DETECTED Final   Mycoplasma pneumoniae NOT DETECTED NOT DETECTED Final    Comment: Performed at [facility], [address] [place], [place] [address]  Urine Culture  Status: None   Collection Time: 02/24/23  9:30 PM   Specimen: Urine, Random  Result Value Ref Range Status   Specimen Description   Final    URINE, RANDOM Performed at [facility], [address] [place], [place] [address]    Special Requests   Final    NONE Reflexed from [id number] Performed at [facility], [address], [place], [place] [address]    Culture   Final    NO GROWTH Performed at [facility], [address] [place], [place] [address]    Report Status 02/26/2023 FINAL  Final  MRSA Next Gen by PCR, Nasal     Status: None   Collection Time: 02/25/23 11:33 AM   Specimen: Nasal Mucosa; Nasal Swab  Result Value Ref Range Status   MRSA by PCR Next Gen NOT DETECTED NOT DETECTED Final    Comment: (NOTE) The GeneXpert MRSA Assay (FDA approved for NASAL specimens only), is one component of a comprehensive MRSA colonization surveillance program. It is not intended to diagnose MRSA infection nor to guide or monitor treatment for MRSA infections. Test performance is not FDA approved in patients less than 67 years old. Performed at [facility], [address], [place], [place] [address]      Radiology Studies: No results found.  Scheduled Meds:  amitriptyline  50 mg Oral QHS   amLODipine  5 mg Oral Daily   apixaban  5 mg Oral BID   azaTHIOprine  150 mg Oral Daily   cholecalciferol  2,000 Units Oral Q0600    cyanocobalamin  1,000 mcg Subcutaneous Daily   ezetimibe  10 mg Oral Daily   guaiFENesin  1,200 mg Oral BID   insulin aspart  0-15 Units Subcutaneous TID WC   insulin aspart  4 Units Subcutaneous TID WC   insulin glargine-yfgn  12 Units Subcutaneous Daily   ipratropium-albuterol  3 mL Nebulization TID   levothyroxine  100 mcg Oral QAC breakfast   lidocaine  1 patch Transdermal Daily   loratadine  10 mg Oral Daily   magnesium oxide  400 mg Oral Daily   melatonin  6 mg Oral QHS   metoprolol tartrate  50 mg Oral BID   predniSONE  10 mg Oral Q breakfast   sertraline  25 mg Oral Daily   Continuous Infusions:  sodium chloride 10 mL/hr at 02/28/23 1024     LOS: 4 days   Time spent: 42 mins  Jozy Mcphearson Laural Benes, MD How to contact the [facility] Attending or Consulting provider 7A - 7P or covering provider during after hours 7P -7A, for this patient?  Check the care team in [facility] and look for a) attending/consulting [facility] provider listed and b) the [facility] team listed Log into [email or web address] and use [facility] universal password to access. If you do not have the password, please contact the hospital operator. Locate the [facility] provider you are looking for under [facility] and page to a number that you can be directly reached. If you still have difficulty reaching the provider, please page the [facility] (Director on Call) for the Hospitalists listed on amion for assistance.  02/28/2023, 11:19 AM

## 2023-02-28 NOTE — Care Management Important Message (Signed)
Important Message  Patient Details  Name: [name] MRN: [id number] Date of Birth: 11-Aug-1945   Important Message Given:  Yes - Medicare IM (copy given to family member in room)     Corey Harold 02/28/2023, 1:44 PM

## 2023-02-28 NOTE — Progress Notes (Signed)
OT Cancellation Note  Patient Details Name: [name] MRN: [id number] DOB: 09-30-1945   Cancelled Treatment:    Reason Eval/Treat Not Completed: Patient declined, no reason specified;Other (comment). Pt was supine in bed coughing and reportedly feeling nauseous. Pt was encouraged several times to participate and that changing positions may heap ease her cough, but the pt declined. Will attempt to see pt later as time permits.   Shihab States OT, MOT   Danie Chandler 02/28/2023, 9:42 AM

## 2023-03-01 DIAGNOSIS — I480 Paroxysmal atrial fibrillation: Secondary | ICD-10-CM | POA: Diagnosis not present

## 2023-03-01 DIAGNOSIS — E2740 Unspecified adrenocortical insufficiency: Secondary | ICD-10-CM | POA: Diagnosis not present

## 2023-03-01 DIAGNOSIS — R4182 Altered mental status, unspecified: Secondary | ICD-10-CM | POA: Diagnosis not present

## 2023-03-01 DIAGNOSIS — K754 Autoimmune hepatitis: Secondary | ICD-10-CM | POA: Diagnosis not present

## 2023-03-01 DIAGNOSIS — I10 Essential (primary) hypertension: Secondary | ICD-10-CM

## 2023-03-01 LAB — CULTURE, BLOOD (ROUTINE X 2)
Culture: NO GROWTH
Culture: NO GROWTH
Special Requests: ADEQUATE

## 2023-03-01 LAB — GLUCOSE, CAPILLARY
Glucose-Capillary: 74 mg/dL (ref 70–99)
Glucose-Capillary: 95 mg/dL (ref 70–99)
Glucose-Capillary: 95 mg/dL (ref 70–99)
Glucose-Capillary: 197 mg/dL — ABNORMAL HIGH (ref 70–99)

## 2023-03-01 MED ORDER — LEVOTHYROXINE SODIUM 100 MCG PO TABS: 100.0000 ug | ORAL_TABLET | Freq: Every day | ORAL | 1 refills | Status: DC

## 2023-03-01 MED ORDER — GUAIFENESIN ER 600 MG PO TB12: 1200.0000 mg | ORAL_TABLET | Freq: Two times a day (BID) | ORAL | Status: AC

## 2023-03-01 MED ORDER — IPRATROPIUM-ALBUTEROL 0.5-2.5 (3) MG/3ML IN SOLN: 3.0000 mL | Freq: Two times a day (BID) | RESPIRATORY_TRACT | Status: DC

## 2023-03-01 MED ORDER — LANTUS SOLOSTAR 100 UNIT/ML ~~LOC~~ SOPN: 14.0000 [IU] | PEN_INJECTOR | Freq: Two times a day (BID) | SUBCUTANEOUS | Status: DC

## 2023-03-01 MED ORDER — AMLODIPINE BESYLATE 5 MG PO TABS: 5.0000 mg | ORAL_TABLET | Freq: Every day | ORAL | 1 refills | Status: DC

## 2023-03-01 NOTE — Discharge Summary (Signed)
Physician Discharge Summary  BRYSA PREZIOSI WUJ:[id number] DOB: [birth year] DOA: 02/24/2023  PCP: Galvin Proffer, MD  Admit date: 02/24/2023 Discharge date: 03/01/2023  Admitted From: ALF Disposition: ALF   Recommendations for Outpatient Follow-up:  Follow up with PCP in 2 weeks Please obtain BMP in 2 weeks Please check TSH and free T4 in 2 months  Please monitor blood sugar at least 4-5 times per day Please monitor blood pressure and adjust BP meds as needed Please follow up on the following pending results:  Final culture data  Home Health:  PT   Discharge Condition: STABLE   CODE STATUS: FULL DIET: carb modified heart healthy   Brief Hospitalization Summary: Please see all hospital notes, images, labs for full details of the hospitalization. Admission provider HPI:  77 y.o. female,  with a history of COPD, hypertension, hyperlipidemia, hypothyroidism, CAD s/p stent placement, GERD, diabetes mellitus type 2, blindness, obesity. Pt is SNF resident, she was brought by the facility for intermittent confusion, this has been recurrent over last few weeks per her family, but has much worsened over the last week, patient has been having cough since Monday, some phlegm present, so she was sent for further evaluation, she herself denies any chest pain, dyspnea, but she reports cough, phlegm, she denies any urinary symptoms, she is with known history of COPD as well. -In ED patient was febrile 100.5, initially confused, but mentation much improved, chest x-ray with no acute findings, UA still pending, as were significant for lactic acid of 2.6, white blood cell count of 2.7, [facility] hospitalist consulted to admit.  Hospital Course by Problem list   Sepsis from HCAP - sepsis physiology resolved  - lactic acid trended back down after treatments  - LR sepsis bolus protocol given - completed full 5 day course of IV antibiotics and supportive measures   COPD with acute exacerbation - treated with  steroids, bronchodilators, nebs   Paroxysmal Afib  - resumed home apixaban and metoprolol XL   Acute metabolic encephalopathy - mentation improving with supportive measures    Essential hypertension  - suboptimally controlled  - added amlodipine 5 mg daily    Hypothyroidism - TSH  markedly elevated  - had no longer been taking thyroid replacement - follow up free T4 <0.25 - restart levothyroxine  - please check TSH and free T4 in 2 months   Autoimmune hepatitis  - resumed prednisone and imuran   GERD - pantoprazole for GI protection   DM type 2 with hyperglycemia  - resumed SSI coverage, prandial and basal coverage - check CBG 5 times per day CBG (last 3)  Recent Labs (last 2 labs)       Recent Labs    02/27/23 2010 02/28/23 0304 02/28/23 0743  GLUCAP 406* 163* 104*      Hypoglycemia from insulin - reduced insulin doses  - continue frequent CBG monitoring    Discharge Diagnoses:  Principal Problem:   AMS (altered mental status) Active Problems:   Hypothyroidism   Obesity, Class II, BMI 35-39.9   BLINDNESS   Essential hypertension   COPD exacerbation (HCC)   DM type 2 (diabetes mellitus, type 2) (HCC)   Acute metabolic encephalopathy   CKD (chronic kidney disease) stage 3, GFR 30-59 ml/min (HCC)   Adrenal insufficiency (HCC)   Acute diastolic CHF (congestive heart failure) (HCC)   NASH (nonalcoholic steatohepatitis)   Autoimmune hepatitis (HCC)   Diabetic neuropathy (HCC)   Paroxysmal atrial fibrillation with RVR (HCC)  Discharge Instructions:  Allergies as of 03/01/2023       Reactions   Codeine Anaphylaxis, Other (See Comments)   REACTION: caused "cramping in hands" and hyperventilation.        Medication List     STOP taking these medications    ROBITUSSIN PE PO       TAKE these medications    albuterol 108 (90 Base) MCG/ACT inhaler Commonly known as: VENTOLIN HFA Inhale 2 puffs into the lungs every 6 (six) hours as needed for  wheezing or shortness of breath.   amitriptyline 50 MG tablet Commonly known as: ELAVIL Take 50 mg by mouth at bedtime.   amLODipine 5 MG tablet Commonly known as: NORVASC Take 1 tablet (5 mg total) by mouth daily. Start taking on: March 02, 2023   apixaban 5 MG Tabs tablet Commonly known as: ELIQUIS Take 1 tablet (5 mg total) by mouth 2 (two) times daily.   azaTHIOprine 50 MG tablet Commonly known as: IMURAN Take 3 tablets (150 mg total) by mouth daily.   calcium carbonate 500 MG chewable tablet Commonly known as: TUMS - dosed in mg elemental calcium Chew 1,000 mg by mouth every 8 (eight) hours as needed for heartburn.   Cholecalciferol 50 MCG (2000 UT) Tabs Take 2,000 Units by mouth daily at 6 (six) AM.   Delsym 30 MG/5ML liquid Generic drug: dextromethorphan Take 10 mLs by mouth 2 (two) times daily as needed for cough.   ezetimibe 10 MG tablet Commonly known as: ZETIA Take 1 tablet (10 mg total) by mouth daily.   glipiZIDE 5 MG tablet Commonly known as: GLUCOTROL Take 5 mg by mouth 2 (two) times daily.   guaiFENesin 600 MG 12 hr tablet Commonly known as: MUCINEX Take 2 tablets (1,200 mg total) by mouth 2 (two) times daily for 3 days.   Hemorrhoidal 0.25-14-71.9 % Oint Generic drug: Phenylephrine-Mineral Oil-Pet Place 1 application rectally every 6 (six) hours as needed (hemorrhoid discomfort).   Imodium A-D 2 MG tablet Generic drug: loperamide Take 4 mg by mouth as needed for diarrhea or loose stools.   insulin lispro 100 UNIT/ML KwikPen Commonly known as: HUMALOG Inject 2-15 Units into the skin 3 (three) times daily before meals. CBG < 121 = 0 units, 121-150 = 2 units, 151-200 = 3 units, 201-250 = 5 units, 251-300 = 8 units, 301-400 = 15 units.   ipratropium-albuterol 0.5-2.5 (3) MG/3ML Soln Commonly known as: DUONEB Take 3 mLs by nebulization in the morning and at bedtime.   Lantus SoloStar 100 UNIT/ML Solostar Pen Generic drug: insulin  glargine Inject 14 Units into the skin 2 (two) times daily. Prime pen with 2u prior to each use What changed: how much to take   lidocaine 5 % Commonly known as: LIDODERM Place 1 patch onto the skin daily. On for 12 hours, off for 12 hours.   loratadine 10 MG tablet Commonly known as: CLARITIN Take 10 mg by mouth daily.   magnesium oxide 400 MG tablet Commonly known as: MAG-OX Take 400 mg by mouth daily.   melatonin 5 MG Tabs Take 5 mg by mouth at bedtime.   metoprolol tartrate 50 MG tablet Commonly known as: LOPRESSOR Take 1 tablet (50 mg total) by mouth 2 (two) times daily.   MILK OF MAGNESIA PO Take by mouth. As needed   nystatin powder Generic drug: nystatin Apply 1 Application topically 3 (three) times daily as needed (redness under abdominal folds and breasts).   polyethylene glycol 17 g  packet Commonly known as: MIRALAX / GLYCOLAX Take 17 g by mouth daily.   potassium chloride 10 MEQ CR capsule Commonly known as: MICRO-K Take 20 mEq by mouth 2 (two) times daily.   predniSONE 10 MG tablet Commonly known as: DELTASONE Take 10 mg by mouth daily.   promethazine-dextromethorphan 6.25-15 MG/5ML syrup Commonly known as: PROMETHAZINE-DM Take 5 mLs by mouth 4 (four) times daily as needed for cough.   sertraline 25 MG tablet Commonly known as: ZOLOFT Take 25 mg by mouth daily.        Follow-up Information     [facility] Follow up.   Why: Will schedule visits. Contact information: [address] [place] [address] [phone]         Galvin Proffer, MD. Schedule an appointment as soon as possible for a visit in 2 week(s).   Specialty: Internal Medicine Why: Hospital Follow Up Contact information: [address] [place] [address] [phone]                Allergies  Allergen Reactions   Codeine Anaphylaxis and Other (See Comments)    REACTION: caused "cramping in hands" and hyperventilation.    Allergies as of 03/01/2023       Reactions   Codeine Anaphylaxis, Other (See Comments)   REACTION: caused "cramping in hands" and hyperventilation.        Medication List     STOP taking these medications    ROBITUSSIN PE PO       TAKE these medications    albuterol 108 (90 Base) MCG/ACT inhaler Commonly known as: VENTOLIN HFA Inhale 2 puffs into the lungs every 6 (six) hours as needed for wheezing or shortness of breath.   amitriptyline 50 MG tablet Commonly known as: ELAVIL Take 50 mg by mouth at bedtime.   amLODipine 5 MG tablet Commonly known as: NORVASC Take 1 tablet (5 mg total) by mouth daily. Start taking on: March 02, 2023   apixaban 5 MG Tabs tablet Commonly known as: ELIQUIS Take 1 tablet (5 mg total) by mouth 2 (two) times daily.   azaTHIOprine 50 MG tablet Commonly known as: IMURAN Take 3 tablets (150 mg total) by mouth daily.   calcium carbonate 500 MG chewable tablet Commonly known as: TUMS - dosed in mg elemental calcium Chew 1,000 mg by mouth every 8 (eight) hours as needed for heartburn.   Cholecalciferol 50 MCG (2000 UT) Tabs Take 2,000 Units by mouth daily at 6 (six) AM.   Delsym 30 MG/5ML liquid Generic drug: dextromethorphan Take 10 mLs by mouth 2 (two) times daily as needed for cough.   ezetimibe 10 MG tablet Commonly known as: ZETIA Take 1 tablet (10 mg total) by mouth daily.   glipiZIDE 5 MG tablet Commonly known as: GLUCOTROL Take 5 mg by mouth 2 (two) times daily.   guaiFENesin 600 MG 12 hr tablet Commonly known as: MUCINEX Take 2 tablets (1,200 mg total) by mouth 2 (two) times daily for 3 days.   Hemorrhoidal 0.25-14-71.9 % Oint Generic drug: Phenylephrine-Mineral Oil-Pet Place 1 application rectally every 6 (six) hours as needed (hemorrhoid discomfort).   Imodium A-D 2 MG tablet Generic drug: loperamide Take 4 mg by mouth as needed for diarrhea or loose stools.   insulin lispro 100 UNIT/ML KwikPen Commonly  known as: HUMALOG Inject 2-15 Units into the skin 3 (three) times daily before meals. CBG < 121 = 0 units, 121-150 = 2 units, 151-200 =  3 units, 201-250 = 5 units, 251-300 = 8 units, 301-400 = 15 units.   ipratropium-albuterol 0.5-2.5 (3) MG/3ML Soln Commonly known as: DUONEB Take 3 mLs by nebulization in the morning and at bedtime.   Lantus SoloStar 100 UNIT/ML Solostar Pen Generic drug: insulin glargine Inject 14 Units into the skin 2 (two) times daily. Prime pen with 2u prior to each use What changed: how much to take   lidocaine 5 % Commonly known as: LIDODERM Place 1 patch onto the skin daily. On for 12 hours, off for 12 hours.   loratadine 10 MG tablet Commonly known as: CLARITIN Take 10 mg by mouth daily.   magnesium oxide 400 MG tablet Commonly known as: MAG-OX Take 400 mg by mouth daily.   melatonin 5 MG Tabs Take 5 mg by mouth at bedtime.   metoprolol tartrate 50 MG tablet Commonly known as: LOPRESSOR Take 1 tablet (50 mg total) by mouth 2 (two) times daily.   MILK OF MAGNESIA PO Take by mouth. As needed   nystatin powder Generic drug: nystatin Apply 1 Application topically 3 (three) times daily as needed (redness under abdominal folds and breasts).   polyethylene glycol 17 g packet Commonly known as: MIRALAX / GLYCOLAX Take 17 g by mouth daily.   potassium chloride 10 MEQ CR capsule Commonly known as: MICRO-K Take 20 mEq by mouth 2 (two) times daily.   predniSONE 10 MG tablet Commonly known as: DELTASONE Take 10 mg by mouth daily.   promethazine-dextromethorphan 6.25-15 MG/5ML syrup Commonly known as: PROMETHAZINE-DM Take 5 mLs by mouth 4 (four) times daily as needed for cough.   sertraline 25 MG tablet Commonly known as: ZOLOFT Take 25 mg by mouth daily.        Procedures/Studies: DG CHEST PORT 1 VIEW  Result Date: 02/26/2023 CLINICAL DATA:  Pneumonia EXAM: PORTABLE CHEST 1 VIEW COMPARISON:  Chest radiograph dated 02/24/2023. FINDINGS: The  heart is enlarged. Vascular calcifications are seen in the aortic arch. Mild bilateral lower lung predominant interstitial and airspace opacities. No pleural effusion or pneumothorax. Degenerative changes are seen in the spine. IMPRESSION: Mild bilateral lower lung predominant interstitial and airspace opacities may represent pulmonary edema or pneumonia. Electronically Signed   By: Romona Curls M.D.   On: 02/26/2023 10:01   DG Chest Port 1 View  Result Date: 02/24/2023 CLINICAL DATA:  Possible sepsis. EXAM: PORTABLE CHEST 1 VIEW COMPARISON:  September 22, 2022. FINDINGS: Stable cardiomediastinal silhouette. Both lungs are clear. The visualized skeletal structures are unremarkable. IMPRESSION: No active disease. Electronically Signed   By: Lupita Raider M.D.   On: 02/24/2023 16:19     Subjective: Pt having less cough and chest congestion. No fever or chills.  No CP.   Discharge Exam: Vitals:   02/28/23 2035 03/01/23 0329  BP: (!) 164/75 (!) 160/73  Pulse: (!) 51 (!) 55  Resp:    Temp: 98.7 F (37.1 C) 97.8 F (36.6 C)  SpO2: 97% 98%   Vitals:   02/28/23 1421 02/28/23 1950 02/28/23 2035 03/01/23 0329  BP:   (!) 164/75 (!) 160/73  Pulse:   (!) 51 (!) 55  Resp:      Temp:   98.7 F (37.1 C) 97.8 F (36.6 C)  TempSrc:   Oral Oral  SpO2: 93% 97% 97% 98%  Weight:      Height:       General exam: legally blind. Appears calm and comfortable  Respiratory system: better air movement bilateral, barky cough  improving.   Cardiovascular system: normal S1 & S2 heard. No JVD, murmurs, rubs, gallops or clicks. No pedal edema. Gastrointestinal system: Abdomen is nondistended, soft and nontender. No organomegaly or masses felt. Normal bowel sounds heard. Central nervous system: Alert and oriented. No focal neurological deficits. Extremities: Symmetric 5 x 5 power. Skin: No rashes, lesions or ulcers. Psychiatry: Judgement and insight appear normal. Mood & affect appropriate.    The results of  significant diagnostics from this hospitalization (including imaging, microbiology, ancillary and laboratory) are listed below for reference.     Microbiology: Recent Results (from the past 240 hour(s))  Culture, blood (Routine x 2)     Status: None   Collection Time: 02/24/23  2:18 PM   Specimen: BLOOD  Result Value Ref Range Status   Specimen Description BLOOD BLOOD RIGHT ARM  Final   Special Requests   Final    BOTTLES DRAWN AEROBIC AND ANAEROBIC Blood Culture results may not be optimal due to an excessive volume of blood received in culture bottles   Culture   Final    NO GROWTH 5 DAYS Performed at [facility], [address], [place], [place] [address]    Report Status 03/01/2023 FINAL  Final  Resp panel by RT-PCR (RSV, Flu A&B, Covid) Anterior Nasal Swab     Status: None   Collection Time: 02/24/23  2:40 PM   Specimen: Anterior Nasal Swab  Result Value Ref Range Status   SARS Coronavirus 2 by RT PCR NEGATIVE NEGATIVE Final    Comment: (NOTE) SARS-CoV-2 target nucleic acids are NOT DETECTED.  The SARS-CoV-2 RNA is generally detectable in upper respiratory specimens during the acute phase of infection. The lowest concentration of SARS-CoV-2 viral copies this assay can detect is 138 copies/mL. A negative result does not preclude SARS-Cov-2 infection and should not be used as the sole basis for treatment or other patient management decisions. A negative result may occur with  improper specimen collection/handling, submission of specimen other than nasopharyngeal swab, presence of viral mutation(s) within the areas targeted by this assay, and inadequate number of viral copies(<138 copies/mL). A negative result must be combined with clinical observations, patient history, and epidemiological information. The expected result is Negative.  Fact Sheet for Patients:  BloggerCourse.com  Fact Sheet for Healthcare Providers:   SeriousBroker.it  This test is no t yet approved or cleared by the [place] FDA and  has been authorized for detection and/or diagnosis of SARS-CoV-2 by FDA under an Emergency Use Authorization (EUA). This EUA will remain  in effect (meaning this test can be used) for the duration of the COVID-19 declaration under Section 564(b)(1) of the Act, 21 U.S.C.section 360bbb-3(b)(1), unless the authorization is terminated  or revoked sooner.       Influenza A by PCR NEGATIVE NEGATIVE Final   Influenza B by PCR NEGATIVE NEGATIVE Final    Comment: (NOTE) The Xpert Xpress SARS-CoV-2/FLU/RSV plus assay is intended as an aid in the diagnosis of influenza from Nasopharyngeal swab specimens and should not be used as a sole basis for treatment. Nasal washings and aspirates are unacceptable for Xpert Xpress SARS-CoV-2/FLU/RSV testing.  Fact Sheet for Patients: BloggerCourse.com  Fact Sheet for Healthcare Providers: SeriousBroker.it  This test is not yet approved or cleared by the [place] FDA and has been authorized for detection and/or diagnosis of SARS-CoV-2 by FDA under an Emergency Use Authorization (EUA). This EUA will remain in effect (meaning this test can be used) for the duration of the COVID-19  declaration under Section 564(b)(1) of the Act, 21 U.S.C. section 360bbb-3(b)(1), unless the authorization is terminated or revoked.     Resp Syncytial Virus by PCR NEGATIVE NEGATIVE Final    Comment: (NOTE) Fact Sheet for Patients: BloggerCourse.com  Fact Sheet for Healthcare Providers: SeriousBroker.it  This test is not yet approved or cleared by the [place] FDA and has been authorized for detection and/or diagnosis of SARS-CoV-2 by FDA under an Emergency Use Authorization (EUA). This EUA will remain in effect (meaning this test can be used) for  the duration of the COVID-19 declaration under Section 564(b)(1) of the Act, 21 U.S.C. section 360bbb-3(b)(1), unless the authorization is terminated or revoked.  Performed at [facility], [address], [place], [place] [address]   Culture, blood (Routine x 2)     Status: None   Collection Time: 02/24/23  3:14 PM   Specimen: BLOOD  Result Value Ref Range Status   Specimen Description BLOOD BLOOD RIGHT ARM  Final   Special Requests   Final    BOTTLES DRAWN AEROBIC AND ANAEROBIC Blood Culture adequate volume   Culture   Final    NO GROWTH 5 DAYS Performed at [facility], [address] [place], [place] [address]    Report Status 03/01/2023 FINAL  Final  Respiratory (~20 pathogens) panel by PCR     Status: None   Collection Time: 02/24/23  5:27 PM   Specimen: Nasopharyngeal Swab; Respiratory  Result Value Ref Range Status   Adenovirus NOT DETECTED NOT DETECTED Final   Coronavirus 229E NOT DETECTED NOT DETECTED Final    Comment: (NOTE) The Coronavirus on the Respiratory Panel, DOES NOT test for the novel  Coronavirus (2019 nCoV)    Coronavirus HKU1 NOT DETECTED NOT DETECTED Final   Coronavirus NL63 NOT DETECTED NOT DETECTED Final   Coronavirus OC43 NOT DETECTED NOT DETECTED Final   Metapneumovirus NOT DETECTED NOT DETECTED Final   Rhinovirus / Enterovirus NOT DETECTED NOT DETECTED Final   Influenza A NOT DETECTED NOT DETECTED Final   Influenza B NOT DETECTED NOT DETECTED Final   Parainfluenza Virus 1 NOT DETECTED NOT DETECTED Final   Parainfluenza Virus 2 NOT DETECTED NOT DETECTED Final   Parainfluenza Virus 3 NOT DETECTED NOT DETECTED Final   Parainfluenza Virus 4 NOT DETECTED NOT DETECTED Final   Respiratory Syncytial Virus NOT DETECTED NOT DETECTED Final   Bordetella pertussis NOT DETECTED NOT DETECTED Final   Bordetella Parapertussis NOT DETECTED NOT DETECTED Final   Chlamydophila pneumoniae NOT DETECTED NOT DETECTED Final   Mycoplasma pneumoniae NOT DETECTED NOT  DETECTED Final    Comment: Performed at [facility], [address] [place], [place] [address]  Urine Culture     Status: None   Collection Time: 02/24/23  9:30 PM   Specimen: Urine, Random  Result Value Ref Range Status   Specimen Description   Final    URINE, RANDOM Performed at [facility], [address], [place], [place] [address]    Special Requests   Final    NONE Reflexed from [id number] Performed at [facility], [address], [place], [place] [address]    Culture   Final    NO GROWTH Performed at [facility], [address], [place], [place] [address]    Report Status 02/26/2023 FINAL  Final  MRSA Next Gen by PCR, Nasal     Status: None   Collection Time: 02/25/23 11:33 AM   Specimen: Nasal Mucosa; Nasal Swab  Result  Value Ref Range Status   MRSA by PCR Next Gen NOT DETECTED NOT DETECTED Final    Comment: (NOTE) The GeneXpert MRSA Assay (FDA approved for NASAL specimens only), is one component of a comprehensive MRSA colonization surveillance program. It is not intended to diagnose MRSA infection nor to guide or monitor treatment for MRSA infections. Test performance is not FDA approved in patients less than 37 years old. Performed at [facility], [address] [place], [place] [address]      Labs: BNP (last 3 results) Recent Labs    02/24/23 1514  BNP 191.0*   Basic Metabolic Panel: Recent Labs  Lab 02/24/23 1514 02/25/23 0444 02/26/23 0423 02/27/23 0706 02/28/23 0446  NA 134* 132* 137  --  135  K 4.3 4.1 3.6  --  3.4*  CL 98 99 102  --  97*  CO2 25 23 26   --  31  GLUCOSE 109* 286* 143*  --  148*  BUN 16 17 16   --  13  CREATININE 0.82 0.71 0.75  --  0.73  CALCIUM 8.6* 8.2* 8.3*  --  8.2*  MG  --   --   --  1.7  --    Liver Function Tests: Recent Labs  Lab 02/24/23 1514  AST 69*  ALT 27  ALKPHOS 62  BILITOT 1.4*  PROT 7.1  ALBUMIN 3.1*   No results for input(s): "LIPASE", "AMYLASE" in the last 168 hours. Recent Labs  Lab  02/24/23 1514  AMMONIA 11   CBC: Recent Labs  Lab 02/24/23 1514 02/25/23 0444 02/26/23 0423  WBC 2.7* 3.0* 4.1  NEUTROABS 2.0  --   --   HGB 10.9* 10.7* 10.2*  HCT 32.5* 33.6* 31.8*  MCV 109.1* 110.9* 111.6*  PLT 159 154 168   Cardiac Enzymes: No results for input(s): "CKTOTAL", "CKMB", "CKMBINDEX", "TROPONINI" in the last 168 hours. BNP: Invalid input(s): "POCBNP" CBG: Recent Labs  Lab 02/28/23 1624 02/28/23 2035 03/01/23 0311 03/01/23 0715 03/01/23 0744  GLUCAP 198* 222* 74 95 95   D-Dimer No results for input(s): "DDIMER" in the last 72 hours. Hgb A1c No results for input(s): "HGBA1C" in the last 72 hours. Lipid Profile No results for input(s): "CHOL", "HDL", "LDLCALC", "TRIG", "CHOLHDL", "LDLDIRECT" in the last 72 hours. Thyroid function studies No results for input(s): "TSH", "T4TOTAL", "T3FREE", "THYROIDAB" in the last 72 hours.  Invalid input(s): "FREET3" Anemia work up No results for input(s): "VITAMINB12", "FOLATE", "FERRITIN", "TIBC", "IRON", "RETICCTPCT" in the last 72 hours. Urinalysis    Component Value Date/Time   COLORURINE AMBER (A) 02/24/2023 2130   APPEARANCEUR CLEAR 02/24/2023 2130   LABSPEC 1.018 02/24/2023 2130   PHURINE 6.0 02/24/2023 2130   GLUCOSEU 50 (A) 02/24/2023 2130   HGBUR SMALL (A) 02/24/2023 2130   BILIRUBINUR NEGATIVE 02/24/2023 2130   KETONESUR NEGATIVE 02/24/2023 2130   PROTEINUR 30 (A) 02/24/2023 2130   UROBILINOGEN 0.2 10/17/2014 1541   NITRITE POSITIVE (A) 02/24/2023 2130   LEUKOCYTESUR MODERATE (A) 02/24/2023 2130   Sepsis Labs Recent Labs  Lab 02/24/23 1514 02/25/23 0444 02/26/23 0423  WBC 2.7* 3.0* 4.1   Microbiology Recent Results (from the past 240 hour(s))  Culture, blood (Routine x 2)     Status: None   Collection Time: 02/24/23  2:18 PM   Specimen: BLOOD  Result Value Ref Range Status   Specimen Description BLOOD BLOOD RIGHT ARM  Final   Special Requests   Final    BOTTLES DRAWN AEROBIC AND  ANAEROBIC  Blood Culture results may not be optimal due to an excessive volume of blood received in culture bottles   Culture   Final    NO GROWTH 5 DAYS Performed at [facility], [address], [place], [place] [address]    Report Status 03/01/2023 FINAL  Final  Resp panel by RT-PCR (RSV, Flu A&B, Covid) Anterior Nasal Swab     Status: None   Collection Time: 02/24/23  2:40 PM   Specimen: Anterior Nasal Swab  Result Value Ref Range Status   SARS Coronavirus 2 by RT PCR NEGATIVE NEGATIVE Final    Comment: (NOTE) SARS-CoV-2 target nucleic acids are NOT DETECTED.  The SARS-CoV-2 RNA is generally detectable in upper respiratory specimens during the acute phase of infection. The lowest concentration of SARS-CoV-2 viral copies this assay can detect is 138 copies/mL. A negative result does not preclude SARS-Cov-2 infection and should not be used as the sole basis for treatment or other patient management decisions. A negative result may occur with  improper specimen collection/handling, submission of specimen other than nasopharyngeal swab, presence of viral mutation(s) within the areas targeted by this assay, and inadequate number of viral copies(<138 copies/mL). A negative result must be combined with clinical observations, patient history, and epidemiological information. The expected result is Negative.  Fact Sheet for Patients:  BloggerCourse.com  Fact Sheet for Healthcare Providers:  SeriousBroker.it  This test is no t yet approved or cleared by the [place] FDA and  has been authorized for detection and/or diagnosis of SARS-CoV-2 by FDA under an Emergency Use Authorization (EUA). This EUA will remain  in effect (meaning this test can be used) for the duration of the COVID-19 declaration under Section 564(b)(1) of the Act, 21 U.S.C.section 360bbb-3(b)(1), unless the authorization is terminated  or revoked sooner.        Influenza A by PCR NEGATIVE NEGATIVE Final   Influenza B by PCR NEGATIVE NEGATIVE Final    Comment: (NOTE) The Xpert Xpress SARS-CoV-2/FLU/RSV plus assay is intended as an aid in the diagnosis of influenza from Nasopharyngeal swab specimens and should not be used as a sole basis for treatment. Nasal washings and aspirates are unacceptable for Xpert Xpress SARS-CoV-2/FLU/RSV testing.  Fact Sheet for Patients: BloggerCourse.com  Fact Sheet for Healthcare Providers: SeriousBroker.it  This test is not yet approved or cleared by the [place] FDA and has been authorized for detection and/or diagnosis of SARS-CoV-2 by FDA under an Emergency Use Authorization (EUA). This EUA will remain in effect (meaning this test can be used) for the duration of the COVID-19 declaration under Section 564(b)(1) of the Act, 21 U.S.C. section 360bbb-3(b)(1), unless the authorization is terminated or revoked.     Resp Syncytial Virus by PCR NEGATIVE NEGATIVE Final    Comment: (NOTE) Fact Sheet for Patients: BloggerCourse.com  Fact Sheet for Healthcare Providers: SeriousBroker.it  This test is not yet approved or cleared by the [place] FDA and has been authorized for detection and/or diagnosis of SARS-CoV-2 by FDA under an Emergency Use Authorization (EUA). This EUA will remain in effect (meaning this test can be used) for the duration of the COVID-19 declaration under Section 564(b)(1) of the Act, 21 U.S.C. section 360bbb-3(b)(1), unless the authorization is terminated or revoked.  Performed at [facility], [address], [place], [place] [address]   Culture, blood (Routine x 2)     Status: None   Collection Time: 02/24/23  3:14 PM   Specimen: BLOOD  Result Value Ref Range Status  Specimen Description BLOOD BLOOD RIGHT ARM  Final   Special Requests   Final    BOTTLES DRAWN AEROBIC AND  ANAEROBIC Blood Culture adequate volume   Culture   Final    NO GROWTH 5 DAYS Performed at [facility], [address], [place], [place] [address]    Report Status 03/01/2023 FINAL  Final  Respiratory (~20 pathogens) panel by PCR     Status: None   Collection Time: 02/24/23  5:27 PM   Specimen: Nasopharyngeal Swab; Respiratory  Result Value Ref Range Status   Adenovirus NOT DETECTED NOT DETECTED Final   Coronavirus 229E NOT DETECTED NOT DETECTED Final    Comment: (NOTE) The Coronavirus on the Respiratory Panel, DOES NOT test for the novel  Coronavirus (2019 nCoV)    Coronavirus HKU1 NOT DETECTED NOT DETECTED Final   Coronavirus NL63 NOT DETECTED NOT DETECTED Final   Coronavirus OC43 NOT DETECTED NOT DETECTED Final   Metapneumovirus NOT DETECTED NOT DETECTED Final   Rhinovirus / Enterovirus NOT DETECTED NOT DETECTED Final   Influenza A NOT DETECTED NOT DETECTED Final   Influenza B NOT DETECTED NOT DETECTED Final   Parainfluenza Virus 1 NOT DETECTED NOT DETECTED Final   Parainfluenza Virus 2 NOT DETECTED NOT DETECTED Final   Parainfluenza Virus 3 NOT DETECTED NOT DETECTED Final   Parainfluenza Virus 4 NOT DETECTED NOT DETECTED Final   Respiratory Syncytial Virus NOT DETECTED NOT DETECTED Final   Bordetella pertussis NOT DETECTED NOT DETECTED Final   Bordetella Parapertussis NOT DETECTED NOT DETECTED Final   Chlamydophila pneumoniae NOT DETECTED NOT DETECTED Final   Mycoplasma pneumoniae NOT DETECTED NOT DETECTED Final    Comment: Performed at [facility], [address], [place], [place] [address]  Urine Culture     Status: None   Collection Time: 02/24/23  9:30 PM   Specimen: Urine, Random  Result Value Ref Range Status   Specimen Description   Final    URINE, RANDOM Performed at [facility], [address] [place], [place] [address]    Special Requests   Final    NONE Reflexed from [id number] Performed at [facility], [address], [place], [place] [address]     Culture   Final    NO GROWTH Performed at [facility], [address] [place], [place] [address]    Report Status 02/26/2023 FINAL  Final  MRSA Next Gen by PCR, Nasal     Status: None   Collection Time: 02/25/23 11:33 AM   Specimen: Nasal Mucosa; Nasal Swab  Result Value Ref Range Status   MRSA by PCR Next Gen NOT DETECTED NOT DETECTED Final    Comment: (NOTE) The GeneXpert MRSA Assay (FDA approved for NASAL specimens only), is one component of a comprehensive MRSA colonization surveillance program. It is not intended to diagnose MRSA infection nor to guide or monitor treatment for MRSA infections. Test performance is not FDA approved in patients less than 60 years old. Performed at [facility], [address] [place], [place] [address]    Time coordinating discharge:  35 mins   SIGNED:  Standley Dakins, MD  [facility] 03/01/2023, 11:04 AM How to contact the [facility] Attending or Consulting provider 7A - 7P or covering provider during after hours 7P -7A, for this patient?  Check the care team in [facility] and look for a) attending/consulting TRH provider listed and b) the [facility] team listed Log into [email or web address] and use 's universal password to access. If  you do not have the password, please contact the hospital operator. Locate the [facility] provider you are looking for under [facility] and page to a number that you can be directly reached. If you still have difficulty reaching the provider, please page the [facility] (Director on Call) for the Hospitalists listed on amion for assistance.

## 2023-03-01 NOTE — [place] FL2 (Signed)
[place] MEDICAID FL2 LEVEL OF CARE FORM     IDENTIFICATION  Patient Name: [name] Birthdate: 1946-02-17 Sex: female Admission Date (Current Location): 02/24/2023  [facility] and [place] Number:  [employer] and Address:  [facility],  [address], [place] [address]      Provider Number: [id number]  Attending Physician Name and Address:  Cleora Fleet, MD  Relative Name and Phone Number:  Jefferey Pica (Sister)  [phone]    Current Level of Care: Hospital Recommended Level of Care: Assisted Living Facility Prior Approval Number:    Date Approved/Denied:   PASRR Number:    Discharge Plan: Domiciliary (Rest home)    Current Diagnoses: Patient Active Problem List   Diagnosis Date Noted   AMS (altered mental status) 02/24/2023   Paroxysmal atrial fibrillation with RVR (HCC) 09/23/2022   Poisoning by unspecified narcotics, intentional self-harm, initial encounter (HCC) 09/22/2022   Pneumonia 09/22/2022   Loose stools 09/14/2022   Bimalleolar fracture of right ankle 06/29/2021   Hypokalemia 06/29/2021   Hyperglycemia due to diabetes mellitus (HCC) 06/29/2021   Insomnia 06/29/2021   Diabetic neuropathy (HCC) 06/29/2021   COVID-19 virus infection 06/29/2021   Autoimmune hepatitis (HCC) 01/20/2021   Elevated LFTs 12/11/2020   NASH (nonalcoholic steatohepatitis) 12/11/2020   Acute lower UTI 06/18/2018   Elevated troponin 06/18/2018   UTI (urinary tract infection) 06/18/2018   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) 06/21/2017   Mastalgia 06/21/2017   Acute diastolic CHF (congestive heart failure) (HCC) 12/20/2016   Adrenal insufficiency (HCC) 12/18/2016   Lobar pneumonia (HCC) 12/15/2016   Acute metabolic encephalopathy 12/15/2016   Thrombocytopenia (HCC) 12/15/2016   CKD (chronic kidney disease) stage 3, GFR 30-59 ml/min (HCC) 12/15/2016   Sepsis (HCC) 12/14/2016   COPD exacerbation (HCC) 04/24/2016   DM type 2  (diabetes mellitus, type 2) (HCC) 04/24/2016   Aortic atherosclerosis (HCC) 04/24/2016   Transaminitis 10/20/2014   Acute bronchitis 10/20/2014   Acute respiratory failure (HCC) 10/17/2014   IBS (irritable bowel syndrome) 08/07/2012   Lower abdominal pain 10/05/2011   Coronary artery disease    Tobacco abuse, in remission    Gastroesophageal reflux disease    Obesity, Class II, BMI 35-39.9 04/01/2010   BLINDNESS 04/01/2010   Hypothyroidism 09/29/2009   Hyperlipidemia, unspecified 09/29/2009   Essential hypertension 09/29/2009    Orientation RESPIRATION BLADDER Height & Weight     Self, Place  Normal Incontinent Weight: 82.9 kg Height:  5\' 1"  (154.9 cm)  BEHAVIORAL SYMPTOMS/MOOD NEUROLOGICAL BOWEL NUTRITION STATUS      Incontinent Diet (Regular diet /No concentrated sweets)  AMBULATORY STATUS COMMUNICATION OF NEEDS Skin   Limited Assist Verbally                         Personal Care Assistance Level of Assistance  Bathing, Feeding, Dressing Bathing Assistance: Limited assistance Feeding assistance: Limited assistance Dressing Assistance: Limited assistance     Functional Limitations Info  Sight, Hearing, Speech Sight Info: Impaired (BLIND) Hearing Info: Adequate Speech Info: Adequate    SPECIAL CARE FACTORS FREQUENCY  PT (By licensed PT)     PT Frequency: 2 -3 times a week -  [facility]              Contractures Contractures Info: Not present    Additional Factors Info  Code Status, Allergies Code Status Info: Full Allergies Info: codeine  Current Medications (03/01/2023):  This is the current hospital active medication list Current Facility-Administered Medications  Medication Dose Route Frequency Provider Last Rate Last Admin   acetaminophen (TYLENOL) tablet 650 mg  650 mg Oral Q6H PRN Elgergawy, Leana Roe, MD       Or   acetaminophen (TYLENOL) suppository 650 mg  650 mg Rectal Q6H PRN Elgergawy, Leana Roe, MD        albuterol (PROVENTIL) (2.5 MG/3ML) 0.083% nebulizer solution 2.5 mg  2.5 mg Nebulization Q2H PRN Elgergawy, Leana Roe, MD       amitriptyline (ELAVIL) tablet 50 mg  50 mg Oral QHS Elgergawy, Leana Roe, MD   50 mg at 02/28/23 2138   amLODipine (NORVASC) tablet 5 mg  5 mg Oral Daily Johnson, Clanford L, MD   5 mg at 03/01/23 9147   apixaban (ELIQUIS) tablet 5 mg  5 mg Oral BID Elgergawy, Leana Roe, MD   5 mg at 03/01/23 0908   azaTHIOprine (IMURAN) tablet 150 mg  150 mg Oral Daily Elgergawy, Leana Roe, MD   150 mg at 03/01/23 0907   calcium carbonate (TUMS - dosed in mg elemental calcium) chewable tablet 1,000 mg  1,000 mg Oral Q8H PRN Elgergawy, Leana Roe, MD       chlorpheniramine-HYDROcodone (TUSSIONEX) 10-8 MG/5ML suspension 5 mL  5 mL Oral Q12H PRN Laural Benes, Clanford L, MD   5 mL at 03/01/23 0538   cholecalciferol (VITAMIN D3) 25 MCG (1000 UNIT) tablet 2,000 Units  2,000 Units Oral [id number] Elgergawy, Leana Roe, MD   2,000 Units at 03/01/23 0535   cyanocobalamin (VITAMIN B12) injection 1,000 mcg  1,000 mcg Subcutaneous Daily Elgergawy, Leana Roe, MD   1,000 mcg at 03/01/23 1004   ezetimibe (ZETIA) tablet 10 mg  10 mg Oral Daily Elgergawy, Leana Roe, MD   10 mg at 03/01/23 0907   guaiFENesin (MUCINEX) 12 hr tablet 1,200 mg  1,200 mg Oral BID Laural Benes, Clanford L, MD   1,200 mg at 03/01/23 2130   hydrALAZINE (APRESOLINE) injection 10 mg  10 mg Intravenous Q4H PRN Johnson, Clanford L, MD       influenza vaccine adjuvanted (FLUAD) injection 0.5 mL  0.5 mL Intramuscular Tomorrow-1000 Johnson, Clanford L, MD       insulin aspart (novoLOG) injection 0-15 Units  0-15 Units Subcutaneous TID WC Johnson, Clanford L, MD   3 Units at 02/28/23 1641   insulin aspart (novoLOG) injection 4 Units  4 Units Subcutaneous TID WC Johnson, Clanford L, MD   4 Units at 03/01/23 1005   insulin glargine-yfgn (SEMGLEE) injection 12 Units  12 Units Subcutaneous Daily Johnson, Clanford L, MD   12 Units at 03/01/23 1005    ipratropium-albuterol (DUONEB) 0.5-2.5 (3) MG/3ML nebulizer solution 3 mL  3 mL Nebulization Q4H PRN Johnson, Clanford L, MD       levothyroxine (SYNTHROID) tablet 100 mcg  100 mcg Oral QAC breakfast Johnson, Clanford L, MD   100 mcg at 03/01/23 0535   lidocaine (LIDODERM) 5 % 1 patch  1 patch Transdermal Daily Johnson, Clanford L, MD   1 patch at 03/01/23 1006   loperamide (IMODIUM) capsule 4 mg  4 mg Oral PRN Johnson, Clanford L, MD       loratadine (CLARITIN) tablet 10 mg  10 mg Oral Daily Johnson, Clanford L, MD   10 mg at 03/01/23 0908   magnesium oxide (MAG-OX) tablet 400 mg  400 mg Oral Daily Elgergawy, Leana Roe, MD   400 mg at 03/01/23  8413   melatonin tablet 6 mg  6 mg Oral QHS Elgergawy, Leana Roe, MD   6 mg at 02/28/23 2138   metoprolol tartrate (LOPRESSOR) tablet 50 mg  50 mg Oral BID Elgergawy, Leana Roe, MD   50 mg at 03/01/23 0908   nystatin (MYCOSTATIN/NYSTOP) topical powder 1 Application  1 Application Topical TID PRN Johnson, Clanford L, MD       ondansetron (ZOFRAN) tablet 4 mg  4 mg Oral Q6H PRN Elgergawy, Leana Roe, MD       Or   ondansetron (ZOFRAN) injection 4 mg  4 mg Intravenous Q6H PRN Elgergawy, Leana Roe, MD       polyethylene glycol (MIRALAX / GLYCOLAX) packet 17 g  17 g Oral Daily PRN Johnson, Clanford L, MD       predniSONE (DELTASONE) tablet 10 mg  10 mg Oral Q breakfast Elgergawy, Leana Roe, MD   10 mg at 03/01/23 0907   sertraline (ZOLOFT) tablet 25 mg  25 mg Oral Daily Elgergawy, Leana Roe, MD   25 mg at 03/01/23 2440     Discharge Medications: Allergies as of 03/01/2023       Reactions   Codeine Anaphylaxis, Other (See Comments)   REACTION: caused "cramping in hands" and hyperventilation.        Medication List     STOP taking these medications    ROBITUSSIN PE PO       TAKE these medications    albuterol 108 (90 Base) MCG/ACT inhaler Commonly known as: VENTOLIN HFA Inhale 2 puffs into the lungs every 6 (six) hours as needed for wheezing or  shortness of breath.   amitriptyline 50 MG tablet Commonly known as: ELAVIL Take 50 mg by mouth at bedtime.   amLODipine 5 MG tablet Commonly known as: NORVASC Take 1 tablet (5 mg total) by mouth daily. Start taking on: March 02, 2023   apixaban 5 MG Tabs tablet Commonly known as: ELIQUIS Take 1 tablet (5 mg total) by mouth 2 (two) times daily.   azaTHIOprine 50 MG tablet Commonly known as: IMURAN Take 3 tablets (150 mg total) by mouth daily.   calcium carbonate 500 MG chewable tablet Commonly known as: TUMS - dosed in mg elemental calcium Chew 1,000 mg by mouth every 8 (eight) hours as needed for heartburn.   Cholecalciferol 50 MCG (2000 UT) Tabs Take 2,000 Units by mouth daily at 6 (six) AM.   Delsym 30 MG/5ML liquid Generic drug: dextromethorphan Take 10 mLs by mouth 2 (two) times daily as needed for cough.   ezetimibe 10 MG tablet Commonly known as: ZETIA Take 1 tablet (10 mg total) by mouth daily.   glipiZIDE 5 MG tablet Commonly known as: GLUCOTROL Take 5 mg by mouth 2 (two) times daily.   guaiFENesin 600 MG 12 hr tablet Commonly known as: MUCINEX Take 2 tablets (1,200 mg total) by mouth 2 (two) times daily for 3 days.   Hemorrhoidal 0.25-14-71.9 % Oint Generic drug: Phenylephrine-Mineral Oil-Pet Place 1 application rectally every 6 (six) hours as needed (hemorrhoid discomfort).   Imodium A-D 2 MG tablet Generic drug: loperamide Take 4 mg by mouth as needed for diarrhea or loose stools.   insulin lispro 100 UNIT/ML KwikPen Commonly known as: HUMALOG Inject 2-15 Units into the skin 3 (three) times daily before meals. CBG < 121 = 0 units, 121-150 = 2 units, 151-200 = 3 units, 201-250 = 5 units, 251-300 = 8 units, 301-400 = 15 units.   ipratropium-albuterol 0.5-2.5 (  3) MG/3ML Soln Commonly known as: DUONEB Take 3 mLs by nebulization in the morning and at bedtime.   Lantus SoloStar 100 UNIT/ML Solostar Pen Generic drug: insulin glargine Inject 14 Units  into the skin 2 (two) times daily. Prime pen with 2u prior to each use What changed: how much to take   levothyroxine 100 MCG tablet Commonly known as: SYNTHROID Take 1 tablet (100 mcg total) by mouth daily before breakfast. Start taking on: March 02, 2023   lidocaine 5 % Commonly known as: LIDODERM Place 1 patch onto the skin daily. On for 12 hours, off for 12 hours.   loratadine 10 MG tablet Commonly known as: CLARITIN Take 10 mg by mouth daily.   magnesium oxide 400 MG tablet Commonly known as: MAG-OX Take 400 mg by mouth daily.   melatonin 5 MG Tabs Take 5 mg by mouth at bedtime.   metoprolol tartrate 50 MG tablet Commonly known as: LOPRESSOR Take 1 tablet (50 mg total) by mouth 2 (two) times daily.   MILK OF MAGNESIA PO Take by mouth. As needed   nystatin powder Generic drug: nystatin Apply 1 Application topically 3 (three) times daily as needed (redness under abdominal folds and breasts).   polyethylene glycol 17 g packet Commonly known as: MIRALAX / GLYCOLAX Take 17 g by mouth daily.   potassium chloride 10 MEQ CR capsule Commonly known as: MICRO-K Take 20 mEq by mouth 2 (two) times daily.   predniSONE 10 MG tablet Commonly known as: DELTASONE Take 10 mg by mouth daily.   promethazine-dextromethorphan 6.25-15 MG/5ML syrup Commonly known as: PROMETHAZINE-DM Take 5 mLs by mouth 4 (four) times daily as needed for cough.   sertraline 25 MG tablet Commonly known as: ZOLOFT Take 25 mg by mouth daily.         Relevant Imaging Results:  Relevant Lab Results:   Additional Information SS# [id number]  Leitha Bleak, RN

## 2023-03-01 NOTE — TOC Transition Note (Addendum)
Transition of Care [facility]) - CM/SW Discharge Note   Patient Details  Name: [name] MRN: [id number] Date of Birth: [birth year]  [facility]) CM/SW Contact:  Leitha Bleak, RN Phone Number: 03/01/2023, 11:28 AM   Clinical Narrative:   Patient medically ready to discharge back to [facility]. FL2 faxed. Confirmed with Toni Amend they will have a driver pick her up at 1PM,  RN updated.  Morrie Sheldon with [facility] updated HHPT ordered.   [facility] staff is on the way, Patient is ready per Charity fundraiser.   Final next level of care: Assisted Living Barriers to Discharge: Barriers Resolved  Patient Goals and CMS Choice CMS Medicare.gov Compare Post Acute Care list provided to:: Patient Represenative (must comment)    Discharge Placement                 Patient to be transferred to facility by: [facility] Staff   Patient and family notified of of transfer: 03/01/23  Discharge Plan and Services Additional resources added to the After Visit Summary for        Social Determinants of Health (SDOH) Interventions SDOH Screenings   Food Insecurity: No Food Insecurity (02/28/2023)  Housing: Low Risk  (02/28/2023)  Transportation Needs: No Transportation Needs (02/28/2023)  Utilities: Not At Risk (02/28/2023)  Tobacco Use: Medium Risk (02/24/2023)  Health Literacy: High Risk (09/08/2020)   Received from [facility]     Readmission Risk Interventions    02/25/2023   11:04 AM 09/23/2022    2:48 PM  Readmission Risk Prevention Plan  Transportation Screening Complete Complete  PCP or Specialist Appt within 3-5 Days Not Complete   HRI or Home Care Consult Complete Complete  Social Work Consult for Recovery Care Planning/Counseling Complete Complete  Palliative Care Screening Not Applicable Not Applicable  Medication Review Oceanographer) Complete Complete

## 2023-03-01 NOTE — Discharge Instructions (Signed)
IMPORTANT INFORMATION: PAY CLOSE ATTENTION   PHYSICIAN DISCHARGE INSTRUCTIONS  Follow with Primary care provider  Hague, Myrene Galas, MD  and other consultants as instructed by your Hospitalist Physician  SEEK MEDICAL CARE OR RETURN TO EMERGENCY ROOM IF SYMPTOMS COME BACK, WORSEN OR NEW PROBLEM DEVELOPS   Please note: You were cared for by a hospitalist during your hospital stay. Every effort will be made to forward records to your primary care provider.  You can request that your primary care provider send for your hospital records if they have not received them.  Once you are discharged, your primary care physician will handle any further medical issues. Please note that NO REFILLS for any discharge medications will be authorized once you are discharged, as it is imperative that you return to your primary care physician (or establish a relationship with a primary care physician if you do not have one) for your post hospital discharge needs so that they can reassess your need for medications and monitor your lab values.  Please get a complete blood count and chemistry panel checked by your Primary MD at your next visit, and again as instructed by your Primary MD.  Get Medicines reviewed and adjusted: Please take all your medications with you for your next visit with your Primary MD  Laboratory/radiological data: Please request your Primary MD to go over all hospital tests and procedure/radiological results at the follow up, please ask your primary care provider to get all Hospital records sent to his/her office.  In some cases, they will be blood work, cultures and biopsy results pending at the time of your discharge. Please request that your primary care provider follow up on these results.  If you are diabetic, please bring your blood sugar readings with you to your follow up appointment with primary care.    Please call and make your follow up appointments as soon as possible.    Also Note  the following: If you experience worsening of your admission symptoms, develop shortness of breath, life threatening emergency, suicidal or homicidal thoughts you must seek medical attention immediately by calling 911 or calling your MD immediately  if symptoms less severe.  You must read complete instructions/literature along with all the possible adverse reactions/side effects for all the Medicines you take and that have been prescribed to you. Take any new Medicines after you have completely understood and accpet all the possible adverse reactions/side effects.   Do not drive when taking Pain medications or sleeping medications (Benzodiazepines)  Do not take more than prescribed Pain, Sleep and Anxiety Medications. It is not advisable to combine anxiety,sleep and pain medications without talking with your primary care practitioner  Special Instructions: If you have smoked or chewed Tobacco  in the last 2 yrs please stop smoking, stop any regular Alcohol  and or any Recreational drug use.  Wear Seat belts while driving.  Do not drive if taking any narcotic, mind altering or controlled substances or recreational drugs or alcohol.

## 2023-03-01 NOTE — Plan of Care (Signed)
  Problem: Coping: Goal: Level of anxiety will decrease Outcome: Progressing   Problem: Pain Managment: Goal: General experience of comfort will improve Outcome: Progressing   

## 2023-03-02 ENCOUNTER — Telehealth (INDEPENDENT_AMBULATORY_CARE_PROVIDER_SITE_OTHER): Payer: Self-pay | Admitting: *Deleted

## 2023-03-02 ENCOUNTER — Other Ambulatory Visit (INDEPENDENT_AMBULATORY_CARE_PROVIDER_SITE_OTHER): Payer: Self-pay | Admitting: *Deleted

## 2023-03-02 DIAGNOSIS — K754 Autoimmune hepatitis: Secondary | ICD-10-CM

## 2023-03-02 NOTE — Telephone Encounter (Signed)
Copied from cc chart from [place]:  Can we see if they can get an IgG drawn on her as well?       Order for IgG put in and faxed to [place] [facility].   Await results.

## 2023-03-03 DIAGNOSIS — E038 Other specified hypothyroidism: Secondary | ICD-10-CM | POA: Diagnosis not present

## 2023-03-03 DIAGNOSIS — Z79899 Other long term (current) drug therapy: Secondary | ICD-10-CM | POA: Diagnosis not present

## 2023-03-15 ENCOUNTER — Other Ambulatory Visit (INDEPENDENT_AMBULATORY_CARE_PROVIDER_SITE_OTHER): Payer: Self-pay | Admitting: *Deleted

## 2023-03-15 NOTE — Telephone Encounter (Signed)
igG results sent to you under cc chart

## 2023-03-15 NOTE — Telephone Encounter (Signed)
Faxed request again to [place] [facility] to send results of IgG since we have not received anything.

## 2023-03-24 ENCOUNTER — Telehealth (INDEPENDENT_AMBULATORY_CARE_PROVIDER_SITE_OTHER): Payer: Self-pay | Admitting: *Deleted

## 2023-03-24 NOTE — Telephone Encounter (Signed)
Copied from cc chart so I could document on.   Hannah James, NP  Metro Kung, LPN IgG is normal at 1546, however, her LFTs have gone up mildly with ALT 69, AT 73, Alk phos is normal at 96. WBC looks good at 3.9  Can we confirm she is still taking her prednisone 10mg  daily and AZA 150mg  daily?       I tried to call [employer] [facility] [place] and no answer.unable to leave message.

## 2023-03-30 NOTE — Telephone Encounter (Signed)
Tried to call [employer] and no answer

## 2023-03-31 NOTE — Telephone Encounter (Signed)
[place] [place] [facility] [place] and no answer. Faxed [employer] asking for info needed. Await response.

## 2023-04-01 NOTE — Telephone Encounter (Signed)
[employer] [facility] faxed mar and patient is taking azathioprine 50mg  - 3 tabs daily and prednisone 10 mg daily

## 2023-04-07 ENCOUNTER — Other Ambulatory Visit (INDEPENDENT_AMBULATORY_CARE_PROVIDER_SITE_OTHER): Payer: Self-pay | Admitting: Gastroenterology

## 2023-04-07 DIAGNOSIS — R7989 Other specified abnormal findings of blood chemistry: Secondary | ICD-10-CM

## 2023-04-07 DIAGNOSIS — K7581 Nonalcoholic steatohepatitis (NASH): Secondary | ICD-10-CM

## 2023-04-07 NOTE — Telephone Encounter (Signed)
Faxed orders to [facility]

## 2023-04-12 NOTE — Telephone Encounter (Signed)
Ultrasound scheduled for 04/25/23 at 9:30. Information has been faxed to [facility].

## 2023-04-18 ENCOUNTER — Other Ambulatory Visit (INDEPENDENT_AMBULATORY_CARE_PROVIDER_SITE_OTHER): Payer: Self-pay | Admitting: *Deleted

## 2023-04-20 ENCOUNTER — Encounter (HOSPITAL_COMMUNITY): Payer: Self-pay | Admitting: Internal Medicine

## 2023-04-20 ENCOUNTER — Emergency Department (HOSPITAL_COMMUNITY): Payer: Medicare Other

## 2023-04-20 ENCOUNTER — Other Ambulatory Visit: Payer: Self-pay

## 2023-04-20 ENCOUNTER — Inpatient Hospital Stay (HOSPITAL_COMMUNITY)
Admission: EM | Admit: 2023-04-20 | Discharge: 2023-04-29 | DRG: 291 | Disposition: A | Discharge status: Home Health | Payer: Medicare Other | Source: Skilled Nursing Facility | Attending: Internal Medicine | Admitting: Internal Medicine

## 2023-04-20 DIAGNOSIS — E1165 Type 2 diabetes mellitus with hyperglycemia: Secondary | ICD-10-CM | POA: Diagnosis present

## 2023-04-20 DIAGNOSIS — H548 Legal blindness, as defined in USA: Secondary | ICD-10-CM | POA: Diagnosis not present

## 2023-04-20 DIAGNOSIS — H547 Unspecified visual loss: Secondary | ICD-10-CM | POA: Diagnosis present

## 2023-04-20 DIAGNOSIS — I10 Essential (primary) hypertension: Secondary | ICD-10-CM | POA: Diagnosis present

## 2023-04-20 DIAGNOSIS — Z1152 Encounter for screening for COVID-19: Secondary | ICD-10-CM

## 2023-04-20 DIAGNOSIS — E876 Hypokalemia: Secondary | ICD-10-CM | POA: Diagnosis not present

## 2023-04-20 DIAGNOSIS — K754 Autoimmune hepatitis: Secondary | ICD-10-CM | POA: Diagnosis present

## 2023-04-20 DIAGNOSIS — F05 Delirium due to known physiological condition: Secondary | ICD-10-CM | POA: Diagnosis not present

## 2023-04-20 DIAGNOSIS — E785 Hyperlipidemia, unspecified: Secondary | ICD-10-CM | POA: Diagnosis present

## 2023-04-20 DIAGNOSIS — I5033 Acute on chronic diastolic (congestive) heart failure: Secondary | ICD-10-CM | POA: Diagnosis present

## 2023-04-20 DIAGNOSIS — E66811 Obesity, class 1: Secondary | ICD-10-CM | POA: Diagnosis present

## 2023-04-20 DIAGNOSIS — J441 Chronic obstructive pulmonary disease with (acute) exacerbation: Secondary | ICD-10-CM | POA: Diagnosis present

## 2023-04-20 DIAGNOSIS — I252 Old myocardial infarction: Secondary | ICD-10-CM

## 2023-04-20 DIAGNOSIS — E119 Type 2 diabetes mellitus without complications: Secondary | ICD-10-CM

## 2023-04-20 DIAGNOSIS — I471 Supraventricular tachycardia, unspecified: Secondary | ICD-10-CM | POA: Diagnosis present

## 2023-04-20 DIAGNOSIS — I429 Cardiomyopathy, unspecified: Secondary | ICD-10-CM | POA: Diagnosis present

## 2023-04-20 DIAGNOSIS — Z7901 Long term (current) use of anticoagulants: Secondary | ICD-10-CM

## 2023-04-20 DIAGNOSIS — R0602 Shortness of breath: Secondary | ICD-10-CM

## 2023-04-20 DIAGNOSIS — R946 Abnormal results of thyroid function studies: Secondary | ICD-10-CM | POA: Diagnosis present

## 2023-04-20 DIAGNOSIS — K7581 Nonalcoholic steatohepatitis (NASH): Secondary | ICD-10-CM | POA: Diagnosis present

## 2023-04-20 DIAGNOSIS — Z79624 Long term (current) use of inhibitors of nucleotide synthesis: Secondary | ICD-10-CM

## 2023-04-20 DIAGNOSIS — Z8249 Family history of ischemic heart disease and other diseases of the circulatory system: Secondary | ICD-10-CM

## 2023-04-20 DIAGNOSIS — J9601 Acute respiratory failure with hypoxia: Secondary | ICD-10-CM | POA: Diagnosis present

## 2023-04-20 DIAGNOSIS — E782 Mixed hyperlipidemia: Secondary | ICD-10-CM | POA: Diagnosis present

## 2023-04-20 DIAGNOSIS — K219 Gastro-esophageal reflux disease without esophagitis: Secondary | ICD-10-CM | POA: Diagnosis present

## 2023-04-20 DIAGNOSIS — E2740 Unspecified adrenocortical insufficiency: Secondary | ICD-10-CM | POA: Diagnosis present

## 2023-04-20 DIAGNOSIS — Z90710 Acquired absence of both cervix and uterus: Secondary | ICD-10-CM

## 2023-04-20 DIAGNOSIS — I130 Hypertensive heart and chronic kidney disease with heart failure and stage 1 through stage 4 chronic kidney disease, or unspecified chronic kidney disease: Secondary | ICD-10-CM | POA: Diagnosis present

## 2023-04-20 DIAGNOSIS — Z6838 Body mass index (BMI) 38.0-38.9, adult: Secondary | ICD-10-CM

## 2023-04-20 DIAGNOSIS — E66812 Obesity, class 2: Secondary | ICD-10-CM | POA: Diagnosis present

## 2023-04-20 DIAGNOSIS — Z87891 Personal history of nicotine dependence: Secondary | ICD-10-CM

## 2023-04-20 DIAGNOSIS — Z87892 Personal history of anaphylaxis: Secondary | ICD-10-CM

## 2023-04-20 DIAGNOSIS — N183 Chronic kidney disease, stage 3 unspecified: Secondary | ICD-10-CM | POA: Diagnosis present

## 2023-04-20 DIAGNOSIS — E11649 Type 2 diabetes mellitus with hypoglycemia without coma: Secondary | ICD-10-CM | POA: Diagnosis not present

## 2023-04-20 DIAGNOSIS — Z7952 Long term (current) use of systemic steroids: Secondary | ICD-10-CM

## 2023-04-20 DIAGNOSIS — Z885 Allergy status to narcotic agent status: Secondary | ICD-10-CM

## 2023-04-20 DIAGNOSIS — M25511 Pain in right shoulder: Secondary | ICD-10-CM

## 2023-04-20 DIAGNOSIS — Z79899 Other long term (current) drug therapy: Secondary | ICD-10-CM

## 2023-04-20 DIAGNOSIS — I2510 Atherosclerotic heart disease of native coronary artery without angina pectoris: Secondary | ICD-10-CM | POA: Diagnosis present

## 2023-04-20 DIAGNOSIS — E1122 Type 2 diabetes mellitus with diabetic chronic kidney disease: Secondary | ICD-10-CM | POA: Diagnosis present

## 2023-04-20 DIAGNOSIS — I480 Paroxysmal atrial fibrillation: Principal | ICD-10-CM | POA: Diagnosis present

## 2023-04-20 DIAGNOSIS — E162 Hypoglycemia, unspecified: Secondary | ICD-10-CM | POA: Diagnosis not present

## 2023-04-20 DIAGNOSIS — E1140 Type 2 diabetes mellitus with diabetic neuropathy, unspecified: Secondary | ICD-10-CM | POA: Diagnosis present

## 2023-04-20 DIAGNOSIS — T383X5A Adverse effect of insulin and oral hypoglycemic [antidiabetic] drugs, initial encounter: Secondary | ICD-10-CM | POA: Diagnosis not present

## 2023-04-20 DIAGNOSIS — I4891 Unspecified atrial fibrillation: Principal | ICD-10-CM

## 2023-04-20 DIAGNOSIS — E669 Obesity, unspecified: Secondary | ICD-10-CM | POA: Diagnosis present

## 2023-04-20 DIAGNOSIS — R1084 Generalized abdominal pain: Secondary | ICD-10-CM | POA: Diagnosis present

## 2023-04-20 DIAGNOSIS — Z823 Family history of stroke: Secondary | ICD-10-CM

## 2023-04-20 DIAGNOSIS — Z955 Presence of coronary angioplasty implant and graft: Secondary | ICD-10-CM

## 2023-04-20 DIAGNOSIS — Z794 Long term (current) use of insulin: Secondary | ICD-10-CM | POA: Diagnosis not present

## 2023-04-20 DIAGNOSIS — N390 Urinary tract infection, site not specified: Secondary | ICD-10-CM | POA: Diagnosis present

## 2023-04-20 DIAGNOSIS — Z801 Family history of malignant neoplasm of trachea, bronchus and lung: Secondary | ICD-10-CM

## 2023-04-20 DIAGNOSIS — I5031 Acute diastolic (congestive) heart failure: Secondary | ICD-10-CM | POA: Diagnosis not present

## 2023-04-20 DIAGNOSIS — K5900 Constipation, unspecified: Secondary | ICD-10-CM | POA: Diagnosis not present

## 2023-04-20 DIAGNOSIS — E039 Hypothyroidism, unspecified: Secondary | ICD-10-CM | POA: Diagnosis present

## 2023-04-20 DIAGNOSIS — J9600 Acute respiratory failure, unspecified whether with hypoxia or hypercapnia: Secondary | ICD-10-CM | POA: Diagnosis present

## 2023-04-20 DIAGNOSIS — Z993 Dependence on wheelchair: Secondary | ICD-10-CM

## 2023-04-20 DIAGNOSIS — R4182 Altered mental status, unspecified: Secondary | ICD-10-CM | POA: Diagnosis present

## 2023-04-20 DIAGNOSIS — I5021 Acute systolic (congestive) heart failure: Secondary | ICD-10-CM | POA: Diagnosis not present

## 2023-04-20 DIAGNOSIS — Z79890 Hormone replacement therapy (postmenopausal): Secondary | ICD-10-CM

## 2023-04-20 DIAGNOSIS — Z7984 Long term (current) use of oral hypoglycemic drugs: Secondary | ICD-10-CM

## 2023-04-20 LAB — COMPREHENSIVE METABOLIC PANEL
ALT: 31 U/L (ref 0–44)
AST: 64 U/L — ABNORMAL HIGH (ref 15–41)
Albumin: 3.2 g/dL — ABNORMAL LOW (ref 3.5–5.0)
Alkaline Phosphatase: 80 U/L (ref 38–126)
Anion gap: 9 (ref 5–15)
BUN: 16 mg/dL (ref 8–23)
CO2: 23 mmol/L (ref 22–32)
Calcium: 8.5 mg/dL — ABNORMAL LOW (ref 8.9–10.3)
Chloride: 102 mmol/L (ref 98–111)
Creatinine, Ser: 0.83 mg/dL (ref 0.44–1.00)
GFR, Estimated: >60 mL/min (ref >60)
Glucose, Bld: 134 mg/dL — ABNORMAL HIGH (ref 70–99)
Potassium: 4.3 mmol/L (ref 3.5–5.1)
Sodium: 134 mmol/L — ABNORMAL LOW (ref 135–145)
Total Bilirubin: 0.7 mg/dL (ref <1.2)
Total Protein: 6.7 g/dL (ref 6.5–8.1)

## 2023-04-20 LAB — RESP PANEL BY RT-PCR (RSV, FLU A&B, COVID)  RVPGX2
Influenza A by PCR: NEGATIVE
Influenza B by PCR: NEGATIVE
Resp Syncytial Virus by PCR: NEGATIVE
SARS Coronavirus 2 by RT PCR: NEGATIVE

## 2023-04-20 LAB — GLUCOSE, CAPILLARY
Glucose-Capillary: 80 mg/dL (ref 70–99)
Glucose-Capillary: 72 mg/dL (ref 70–99)

## 2023-04-20 LAB — CBC
HCT: 33.3 % — ABNORMAL LOW (ref 36.0–46.0)
Hemoglobin: 10.3 g/dL — ABNORMAL LOW (ref 12.0–15.0)
MCH: 35.5 pg — ABNORMAL HIGH (ref 26.0–34.0)
MCHC: 30.9 g/dL (ref 30.0–36.0)
MCV: 114.8 fL — ABNORMAL HIGH (ref 80.0–100.0)
Platelets: 159 10*3/uL (ref 150–400)
RBC: 2.9 MIL/uL — ABNORMAL LOW (ref 3.87–5.11)
RDW: 17.5 % — ABNORMAL HIGH (ref 11.5–15.5)
WBC: 3.6 10*3/uL — ABNORMAL LOW (ref 4.0–10.5)
nRBC: 0 % (ref 0.0–0.2)

## 2023-04-20 LAB — TROPONIN I (HIGH SENSITIVITY)
Troponin I (High Sensitivity): 11 ng/L (ref <18)
Troponin I (High Sensitivity): 9 ng/L (ref <18)

## 2023-04-20 LAB — CBG MONITORING, ED: Glucose-Capillary: 121 mg/dL — ABNORMAL HIGH (ref 70–99)

## 2023-04-20 MED ORDER — METOPROLOL TARTRATE 5 MG/5ML IV SOLN
5.0000 mg | INTRAVENOUS | Status: AC
  Administered 2023-04-20: 5 mg via INTRAVENOUS
  Filled 2023-04-20: qty 5

## 2023-04-20 MED ORDER — ASPIRIN 81 MG PO CHEW
162.0000 mg | CHEWABLE_TABLET | Freq: Once | ORAL | Status: AC
  Administered 2023-04-20: 162 mg via ORAL
  Filled 2023-04-20: qty 2

## 2023-04-20 MED ORDER — LACTATED RINGERS IV BOLUS
500.0000 mL | Freq: Once | INTRAVENOUS | Status: AC
  Administered 2023-04-20: 500 mL via INTRAVENOUS

## 2023-04-20 MED ORDER — AZATHIOPRINE 50 MG PO TABS
150.0000 mg | ORAL_TABLET | Freq: Every day | ORAL | Status: DC
  Administered 2023-04-21 – 2023-04-29 (×9): 150 mg via ORAL
  Filled 2023-04-20 (×13): qty 3

## 2023-04-20 MED ORDER — AMITRIPTYLINE HCL 25 MG PO TABS
50.0000 mg | ORAL_TABLET | Freq: Every day | ORAL | Status: DC
  Administered 2023-04-20 – 2023-04-28 (×8): 50 mg via ORAL
  Filled 2023-04-20 (×9): qty 2

## 2023-04-20 MED ORDER — DILTIAZEM HCL 25 MG/5ML IV SOLN
10.0000 mg | Freq: Once | INTRAVENOUS | Status: AC
  Administered 2023-04-20: 10 mg via INTRAVENOUS
  Filled 2023-04-20: qty 5

## 2023-04-20 MED ORDER — ACETAMINOPHEN 650 MG RE SUPP: 650.0000 mg | Freq: Four times a day (QID) | RECTAL | Status: DC | PRN

## 2023-04-20 MED ORDER — ACETAMINOPHEN 325 MG PO TABS
650.0000 mg | ORAL_TABLET | Freq: Four times a day (QID) | ORAL | Status: DC | PRN
  Administered 2023-04-24 – 2023-04-28 (×5): 650 mg via ORAL
  Filled 2023-04-20 (×5): qty 2

## 2023-04-20 MED ORDER — IPRATROPIUM-ALBUTEROL 0.5-2.5 (3) MG/3ML IN SOLN
3.0000 mL | Freq: Two times a day (BID) | RESPIRATORY_TRACT | Status: DC
  Administered 2023-04-21 – 2023-04-23 (×6): 3 mL via RESPIRATORY_TRACT
  Filled 2023-04-20 (×5): qty 3

## 2023-04-20 MED ORDER — LEVOTHYROXINE SODIUM 100 MCG PO TABS
100.0000 ug | ORAL_TABLET | Freq: Every day | ORAL | Status: DC
  Administered 2023-04-21 – 2023-04-29 (×8): 100 ug via ORAL
  Filled 2023-04-20 (×3): qty 1
  Filled 2023-04-20: qty 2
  Filled 2023-04-20 (×2): qty 1
  Filled 2023-04-20: qty 4
  Filled 2023-04-20: qty 1
  Filled 2023-04-20: qty 2
  Filled 2023-04-20: qty 1

## 2023-04-20 MED ORDER — METOPROLOL TARTRATE 50 MG PO TABS
50.0000 mg | ORAL_TABLET | Freq: Two times a day (BID) | ORAL | Status: DC
  Administered 2023-04-20 – 2023-04-23 (×7): 50 mg via ORAL
  Filled 2023-04-20 (×7): qty 1

## 2023-04-20 MED ORDER — FENTANYL CITRATE PF 50 MCG/ML IJ SOSY
50.0000 ug | PREFILLED_SYRINGE | Freq: Once | INTRAMUSCULAR | Status: AC
  Administered 2023-04-20: 50 ug via INTRAVENOUS
  Filled 2023-04-20: qty 1

## 2023-04-20 MED ORDER — APIXABAN 5 MG PO TABS
5.0000 mg | ORAL_TABLET | Freq: Two times a day (BID) | ORAL | Status: DC
  Administered 2023-04-20 – 2023-04-29 (×17): 5 mg via ORAL
  Filled 2023-04-20 (×18): qty 1

## 2023-04-20 MED ORDER — HYDROCORTISONE SOD SUC (PF) 100 MG IJ SOLR
100.0000 mg | Freq: Two times a day (BID) | INTRAMUSCULAR | Status: DC
  Administered 2023-04-20 – 2023-04-21 (×2): 100 mg via INTRAVENOUS
  Filled 2023-04-20 (×2): qty 2

## 2023-04-20 MED ORDER — ALBUTEROL SULFATE (2.5 MG/3ML) 0.083% IN NEBU: 2.5000 mg | INHALATION_SOLUTION | Freq: Four times a day (QID) | RESPIRATORY_TRACT | Status: DC

## 2023-04-20 MED ORDER — INSULIN ASPART 100 UNIT/ML IJ SOLN
0.0000 [IU] | Freq: Every day | INTRAMUSCULAR | Status: DC
Start: 2023-04-20 — End: 2023-04-25
  Administered 2023-04-22: 2 [IU] via SUBCUTANEOUS
  Administered 2023-04-23: 3 [IU] via SUBCUTANEOUS

## 2023-04-20 MED ORDER — LIDOCAINE 5 % EX PTCH
1.0000 | MEDICATED_PATCH | CUTANEOUS | Status: DC
  Administered 2023-04-20 – 2023-04-28 (×9): 1 via TRANSDERMAL
  Filled 2023-04-20 (×12): qty 1

## 2023-04-20 MED ORDER — ALBUTEROL SULFATE (2.5 MG/3ML) 0.083% IN NEBU: 2.5000 mg | INHALATION_SOLUTION | RESPIRATORY_TRACT | Status: DC | PRN

## 2023-04-20 MED ORDER — DILTIAZEM HCL-DEXTROSE 125-5 MG/125ML-% IV SOLN (PREMIX)
5.0000 mg/h | INTRAVENOUS | Status: DC
  Administered 2023-04-20: 5 mg/h via INTRAVENOUS
  Filled 2023-04-20: qty 125

## 2023-04-20 MED ORDER — SODIUM CHLORIDE 0.9% IV SOLUTION: Freq: Once | INTRAVENOUS | Status: DC

## 2023-04-20 MED ORDER — IPRATROPIUM-ALBUTEROL 0.5-2.5 (3) MG/3ML IN SOLN: 3.0000 mL | Freq: Four times a day (QID) | RESPIRATORY_TRACT | Status: DC

## 2023-04-20 MED ORDER — INSULIN ASPART 100 UNIT/ML IJ SOLN
0.0000 [IU] | Freq: Three times a day (TID) | INTRAMUSCULAR | Status: DC
Start: 2023-04-21 — End: 2023-04-25
  Administered 2023-04-21: 5 [IU] via SUBCUTANEOUS
  Administered 2023-04-21: 2 [IU] via SUBCUTANEOUS
  Administered 2023-04-21 – 2023-04-22 (×3): 3 [IU] via SUBCUTANEOUS
  Administered 2023-04-23 – 2023-04-24 (×3): 5 [IU] via SUBCUTANEOUS
  Administered 2023-04-24: 2 [IU] via SUBCUTANEOUS
  Administered 2023-04-24 – 2023-04-25 (×2): 3 [IU] via SUBCUTANEOUS

## 2023-04-20 MED ORDER — HYDROMORPHONE HCL 1 MG/ML IJ SOLN
0.5000 mg | Freq: Once | INTRAMUSCULAR | Status: AC
  Administered 2023-04-20: 0.5 mg via INTRAVENOUS
  Filled 2023-04-20: qty 0.5

## 2023-04-20 MED ORDER — IPRATROPIUM BROMIDE 0.02 % IN SOLN: 0.5000 mg | Freq: Four times a day (QID) | RESPIRATORY_TRACT | Status: DC

## 2023-04-20 MED ORDER — INSULIN GLARGINE-YFGN 100 UNIT/ML ~~LOC~~ SOLN
6.0000 [IU] | Freq: Two times a day (BID) | SUBCUTANEOUS | Status: DC
  Administered 2023-04-21 – 2023-04-22 (×3): 6 [IU] via SUBCUTANEOUS
  Filled 2023-04-20 (×6): qty 0.06

## 2023-04-20 MED ORDER — SERTRALINE HCL 50 MG PO TABS
50.0000 mg | ORAL_TABLET | Freq: Every day | ORAL | Status: DC
  Administered 2023-04-21 – 2023-04-29 (×9): 50 mg via ORAL
  Filled 2023-04-20 (×9): qty 1

## 2023-04-20 MED ORDER — EZETIMIBE 10 MG PO TABS
10.0000 mg | ORAL_TABLET | Freq: Every day | ORAL | Status: DC
  Administered 2023-04-21 – 2023-04-29 (×9): 10 mg via ORAL
  Filled 2023-04-20 (×9): qty 1

## 2023-04-20 MED ORDER — IPRATROPIUM-ALBUTEROL 0.5-2.5 (3) MG/3ML IN SOLN
RESPIRATORY_TRACT | Status: AC
  Filled 2023-04-20: qty 3

## 2023-04-20 NOTE — ED Notes (Signed)
Report given to [place] in ICU, pt can go up when ready.
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
## 2023-04-20 NOTE — ED Provider Notes (Signed)
Patient with atrial fibrillation with RVR.  She also has congestive heart failure.  She is started on a Cardizem drip and is being admitted to medicine   Bethann Berkshire, MD 04/20/23 [phone]

## 2023-04-20 NOTE — Progress Notes (Signed)
Pt is more confused than earlier. 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 She is able to appropriately state her DOB, and month. She does report she lives at [facility] and plans to "get out of the place soon". She often states "hold on I thinking" when asked questions. Her orientation and presentation are different than earlier. No other obvious neuro deficit. She remains in afib with rates from 80's -100 and is on 2 L [place]. BP WNL. She denies pain. CBG is 72. This RN notified overnight coverage Dr Dorthula PerfectDelrae Rend about changes. Awaiting further orders. [place] RN
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
## 2023-04-20 NOTE — ED Notes (Signed)
Patient is yelling out a lot, about the bed and restless legs. Has been redirected bout the yelling but patient seems very agitated.

## 2023-04-20 NOTE — Progress Notes (Signed)
[place] from poison control called to check on patient. This RN reviewed vitals and CXR results. Poison control will close out the case now. [place] RN

## 2023-04-20 NOTE — ED Triage Notes (Signed)
Patient brought by EMS for low O2 (80%) and SOB per facility, EMS found patient to be in A-fib from 70-130's with complaints of right arm pain.

## 2023-04-20 NOTE — ED Provider Notes (Signed)
Patient with atrial fibrillation and RVR with mild congestive heart failure.  She is started on a Cardizem drip and is being admitted by medicine   Bethann Berkshire, MD 04/20/23 [phone]

## 2023-04-20 NOTE — ED Notes (Signed)
This nurse called report to ICU, nurse stated they are moving rooms around and were unable to take patients at this time. States they will call back.

## 2023-04-21 ENCOUNTER — Inpatient Hospital Stay (HOSPITAL_COMMUNITY): Payer: Medicare Other

## 2023-04-21 DIAGNOSIS — I5033 Acute on chronic diastolic (congestive) heart failure: Secondary | ICD-10-CM | POA: Diagnosis not present

## 2023-04-21 DIAGNOSIS — I4891 Unspecified atrial fibrillation: Secondary | ICD-10-CM

## 2023-04-21 DIAGNOSIS — E119 Type 2 diabetes mellitus without complications: Secondary | ICD-10-CM | POA: Diagnosis not present

## 2023-04-21 DIAGNOSIS — J9601 Acute respiratory failure with hypoxia: Secondary | ICD-10-CM | POA: Diagnosis not present

## 2023-04-21 DIAGNOSIS — I2510 Atherosclerotic heart disease of native coronary artery without angina pectoris: Secondary | ICD-10-CM | POA: Diagnosis not present

## 2023-04-21 LAB — URINALYSIS, ROUTINE W REFLEX MICROSCOPIC
Bilirubin Urine: NEGATIVE
Glucose, UA: NEGATIVE mg/dL
Hgb urine dipstick: NEGATIVE
Ketones, ur: 5 mg/dL — AB
Nitrite: NEGATIVE
Protein, ur: 100 mg/dL — AB
Specific Gravity, Urine: 1.024 (ref 1.005–1.030)
WBC, UA: >50 WBC/hpf (ref 0–5)
pH: 5 (ref 5.0–8.0)

## 2023-04-21 LAB — CBC
HCT: 31.8 % — ABNORMAL LOW (ref 36.0–46.0)
Hemoglobin: 10.2 g/dL — ABNORMAL LOW (ref 12.0–15.0)
MCH: 37 pg — ABNORMAL HIGH (ref 26.0–34.0)
MCHC: 32.1 g/dL (ref 30.0–36.0)
MCV: 115.2 fL — ABNORMAL HIGH (ref 80.0–100.0)
Platelets: 163 10*3/uL (ref 150–400)
RBC: 2.76 MIL/uL — ABNORMAL LOW (ref 3.87–5.11)
RDW: 17.6 % — ABNORMAL HIGH (ref 11.5–15.5)
WBC: 3.5 10*3/uL — ABNORMAL LOW (ref 4.0–10.5)
nRBC: 0 % (ref 0.0–0.2)

## 2023-04-21 LAB — BASIC METABOLIC PANEL
Anion gap: 11 (ref 5–15)
BUN: 18 mg/dL (ref 8–23)
CO2: 24 mmol/L (ref 22–32)
Calcium: 8.8 mg/dL — ABNORMAL LOW (ref 8.9–10.3)
Chloride: 100 mmol/L (ref 98–111)
Creatinine, Ser: 0.82 mg/dL (ref 0.44–1.00)
GFR, Estimated: >60 mL/min (ref >60)
Glucose, Bld: 147 mg/dL — ABNORMAL HIGH (ref 70–99)
Potassium: 4.6 mmol/L (ref 3.5–5.1)
Sodium: 135 mmol/L (ref 135–145)

## 2023-04-21 LAB — BLOOD GAS, VENOUS
Acid-base deficit: 0.2 mmol/L (ref 0.0–2.0)
Bicarbonate: 24.8 mmol/L (ref 20.0–28.0)
Drawn by: 6509
O2 Saturation: 54 %
Patient temperature: 36.5
pCO2, Ven: 40 mm[Hg] — ABNORMAL LOW (ref 44–60)
pH, Ven: 7.4 (ref 7.25–7.43)
pO2, Ven: 31 mm[Hg] — CL (ref 32–45)

## 2023-04-21 LAB — GLUCOSE, CAPILLARY
Glucose-Capillary: 144 mg/dL — ABNORMAL HIGH (ref 70–99)
Glucose-Capillary: 231 mg/dL — ABNORMAL HIGH (ref 70–99)
Glucose-Capillary: 191 mg/dL — ABNORMAL HIGH (ref 70–99)
Glucose-Capillary: 127 mg/dL — ABNORMAL HIGH (ref 70–99)

## 2023-04-21 LAB — ABO/RH: ABO/RH(D): B POS

## 2023-04-21 LAB — ECHOCARDIOGRAM LIMITED
Calc EF: 49.8 %
Height: 61 in
S' Lateral: 3.8 cm
Single Plane A2C EF: 49.6 %
Single Plane A4C EF: 50.4 %
Weight: 2941.82 [oz_av]

## 2023-04-21 LAB — TSH: TSH: 3.568 u[IU]/mL (ref 0.350–4.500)

## 2023-04-21 LAB — BRAIN NATRIURETIC PEPTIDE: B Natriuretic Peptide: 539 pg/mL — ABNORMAL HIGH (ref 0.0–100.0)

## 2023-04-21 LAB — MRSA NEXT GEN BY PCR, NASAL: MRSA by PCR Next Gen: NOT DETECTED

## 2023-04-21 LAB — PREPARE RBC (CROSSMATCH)

## 2023-04-21 MED ORDER — FUROSEMIDE 10 MG/ML IJ SOLN
20.0000 mg | Freq: Once | INTRAMUSCULAR | Status: AC
  Administered 2023-04-21: 20 mg via INTRAVENOUS
  Filled 2023-04-21: qty 2

## 2023-04-21 MED ORDER — INSULIN ASPART 100 UNIT/ML IJ SOLN
5.0000 [IU] | Freq: Three times a day (TID) | INTRAMUSCULAR | Status: DC
  Administered 2023-04-21: 5 [IU] via SUBCUTANEOUS

## 2023-04-21 MED ORDER — PREDNISONE 10 MG PO TABS
10.0000 mg | ORAL_TABLET | Freq: Every day | ORAL | Status: DC
  Administered 2023-04-22 – 2023-04-29 (×8): 10 mg via ORAL
  Filled 2023-04-21 (×8): qty 1

## 2023-04-21 MED ORDER — SODIUM CHLORIDE 0.9 % IV SOLN
2.0000 g | INTRAVENOUS | Status: AC
  Administered 2023-04-21 – 2023-04-23 (×3): 2 g via INTRAVENOUS
  Filled 2023-04-21 (×3): qty 20

## 2023-04-21 MED ORDER — DIGOXIN 0.25 MG/ML IJ SOLN
0.5000 mg | Freq: Once | INTRAMUSCULAR | Status: AC
  Administered 2023-04-21: 0.5 mg via INTRAVENOUS
  Filled 2023-04-21: qty 2

## 2023-04-21 MED ORDER — CHLORHEXIDINE GLUCONATE CLOTH 2 % EX PADS
6.0000 | MEDICATED_PAD | Freq: Every day | CUTANEOUS | Status: DC
  Administered 2023-04-21 – 2023-04-29 (×9): 6 via TOPICAL

## 2023-04-21 MED ORDER — POLYETHYLENE GLYCOL 3350 17 G PO PACK
17.0000 g | PACK | Freq: Every day | ORAL | Status: DC
  Administered 2023-04-21 – 2023-04-23 (×3): 17 g via ORAL
  Filled 2023-04-21 (×3): qty 1

## 2023-04-21 MED ORDER — DIGOXIN 0.25 MG/ML IJ SOLN
0.2500 mg | Freq: Once | INTRAMUSCULAR | Status: AC
  Administered 2023-04-21: 0.25 mg via INTRAVENOUS
  Filled 2023-04-21: qty 2

## 2023-04-21 MED ORDER — AZITHROMYCIN 250 MG PO TABS
500.0000 mg | ORAL_TABLET | Freq: Every day | ORAL | Status: AC
  Administered 2023-04-21 – 2023-04-23 (×3): 500 mg via ORAL
  Filled 2023-04-21 (×3): qty 2

## 2023-04-21 MED ORDER — DIGOXIN 0.25 MG/ML IJ SOLN
0.2500 mg | Freq: Once | INTRAMUSCULAR | Status: AC
  Administered 2023-04-21: 0.25 mg via INTRAVENOUS
  Filled 2023-04-21 (×2): qty 2
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
## 2023-04-21 NOTE — TOC Initial Note (Addendum)
Transition of Care [facility]) - Initial/Assessment Note    Patient Details  Name: [name] MRN: [id number] Date of Birth: [birth year]  [facility] CM/SW Contact:    Isabella Bowens, LCSWA Phone Number: 04/21/2023, 2:20 PM  Clinical Narrative:   Patient was admitted for Atrial fibrillation with RVR . Patient is a long-term resident at [facility]. SW spoke with sister. Sister stated that patient has been at [facility] for several years and will return back once is ready. Patient has a bed and wheelchair , which sister states, " that is all she needs ". Sister is POA not Marine scientist.  Patient was reestablished with [facility], spoke with Morrie Sheldon. HHPT order is needed, MD made aware and facility will provide transportation.  Olegario Messier at [facility] was made aware of possible DC and HH being ordered. TOC will continue to follow.          Expected Discharge Plan: Assisted Living Barriers to Discharge: Continued Medical Work up   Patient Goals and CMS Choice Patient states their goals for this hospitalization and ongoing recovery are:: return back to ALF          Expected Discharge Plan and Services     Post Acute Care Choice: Home Health Living arrangements for the past 2 months: Assisted Living Facility                           HH Arranged: [facility] Agency: [facility] Date HH Agency Contacted: 04/21/23 Time HH Agency Contacted: 1417 Representative spoke with at [facility] Agency: Morrie Sheldon  Prior Living Arrangements/Services Living arrangements for the past 2 months: Assisted Living Facility Lives with:: Facility Resident Patient language and need for interpreter reviewed:: Yes Do you feel safe going back to the place where you live?: Yes          Current home services: Home PT    Activities of Daily Living   ADL Screening (condition at time of admission) Independently performs ADLs?: No Does the patient have a NEW difficulty with  bathing/dressing/toileting/self-feeding that is expected to last >3 days?: Yes (Initiates electronic notice to provider for possible OT consult) Does the patient have a NEW difficulty with getting in/out of bed, walking, or climbing stairs that is expected to last >3 days?: Yes (Initiates electronic notice to provider for possible PT consult) Does the patient have a NEW difficulty with communication that is expected to last >3 days?: No Is the patient deaf or have difficulty hearing?: No Does the patient have difficulty seeing, even when wearing glasses/contacts?: Yes (pt is blind) Does the patient have difficulty concentrating, remembering, or making decisions?: No  Permission Sought/Granted Permission sought to share information with : Other (comment) (POA : Mona ( sister )) Permission granted to share information with : Yes, Verbal Permission Granted  Share Information with NAME: Raynelle Fanning     Permission granted to share info w Relationship: Sister : POA     Emotional Assessment     Affect (typically observed): Accepting, Appropriate Orientation: : Oriented to Self Alcohol / Substance Use: Not Applicable Psych Involvement: No (comment)  Admission diagnosis:  Shortness of breath [R06.02] Atrial fibrillation with RVR (HCC) [I48.91] Acute pain of right shoulder [M25.511] Patient Active Problem List   Diagnosis Date Noted   Atrial fibrillation with RVR (HCC) 04/20/2023   AMS (altered mental status) 02/24/2023   Paroxysmal atrial fibrillation with RVR (HCC) 09/23/2022   Poisoning by  unspecified narcotics, intentional self-harm, initial encounter (HCC) 09/22/2022   Pneumonia 09/22/2022   Loose stools 09/14/2022   Bimalleolar fracture of right ankle 06/29/2021   Hypokalemia 06/29/2021   Hyperglycemia due to diabetes mellitus (HCC) 06/29/2021   Insomnia 06/29/2021   Diabetic neuropathy (HCC) 06/29/2021   COVID-19 virus infection 06/29/2021   Autoimmune hepatitis (HCC) 01/20/2021    Elevated LFTs 12/11/2020   NASH (nonalcoholic steatohepatitis) 12/11/2020   Acute lower UTI 06/18/2018   Elevated troponin 06/18/2018   UTI (urinary tract infection) 06/18/2018   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) 06/21/2017   Mastalgia 06/21/2017   Acute diastolic CHF (congestive heart failure) (HCC) 12/20/2016   Adrenal insufficiency (HCC) 12/18/2016   Lobar pneumonia (HCC) 12/15/2016   Acute metabolic encephalopathy 12/15/2016   Thrombocytopenia (HCC) 12/15/2016   CKD (chronic kidney disease) stage 3, GFR 30-59 ml/min (HCC) 12/15/2016   Sepsis (HCC) 12/14/2016   COPD exacerbation (HCC) 04/24/2016   DM type 2 (diabetes mellitus, type 2) (HCC) 04/24/2016   Aortic atherosclerosis (HCC) 04/24/2016   Transaminitis 10/20/2014   Acute bronchitis 10/20/2014   Acute respiratory failure (HCC) 10/17/2014   IBS (irritable bowel syndrome) 08/07/2012   Lower abdominal pain 10/05/2011   Coronary artery disease    Tobacco abuse, in remission    Gastroesophageal reflux disease    Obesity, Class II, BMI 35-39.9 04/01/2010   BLINDNESS 04/01/2010   Hypothyroidism 09/29/2009   Hyperlipidemia, unspecified 09/29/2009   Essential hypertension 09/29/2009   PCP:  Galvin Proffer, MD Pharmacy:   [facility] [place], [place] - [address] [place] [address] Phone: [phone] Fax: [phone]     Social Determinants of Health (SDOH) Social History: SDOH Screenings   Food Insecurity: No Food Insecurity (04/20/2023)  Housing: Low Risk  (04/20/2023)  Transportation Needs: No Transportation Needs (04/20/2023)  Utilities: Not At Risk (04/20/2023)  Tobacco Use: Medium Risk (04/20/2023)  Health Literacy: High Risk (09/08/2020)   Received from [facility]   SDOH Interventions:     Readmission Risk Interventions    04/21/2023    2:15 PM 04/21/2023    2:13 PM 02/25/2023   11:04 AM  Readmission Risk  Prevention Plan  Transportation Screening Complete Complete Complete  PCP or Specialist Appt within 3-5 Days  Not Complete Not Complete  HRI or Home Care Consult Complete Complete Complete  Social Work Consult for Recovery Care Planning/Counseling Complete Complete Complete  Palliative Care Screening Not Applicable Not Applicable Not Applicable  Medication Review Oceanographer) Complete Complete Complete

## 2023-04-21 NOTE — Progress Notes (Signed)
Pt became increasingly confused overnight. She was seeing cats, thinking she was at her brothers house,and this AM believes she is in church.  Overnight coverage ordered head CT but no acute findings obtained. She remains on cardizem drip. Urinalysis obtained this AM and sent to lab for testing. Kellogg RN
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
## 2023-04-21 NOTE — Evaluation (Signed)
Physical Therapy Evaluation Patient Details Name: [name] MRN: [id number] DOB: 09-02-1945 Today's Date: 04/21/2023  History of Present Illness  [name]  is a 77 y.o. female,   with a history of COPD, room air at baseline, hypertension, hyperlipidemia, hypothyroidism, CAD s/p stent placement, GERD, diabetes mellitus type 2, blindness, obesity.  She is wheelchair dependent for last year.  Sisters at bedside,  Pt is SNF resident, she was brought by the facility for dyspnea, hypoxia, at baseline patient with oxygen requirement, she was noted by facility to be dyspneic today, with hypoxia 80% on room air, EMS were called she was found to be in A-fib with heart rate in the 130s, requiring oxygen 2 L which brings her saturation up to 90%, denies any chest pain, fever, chills, cough, she does report vomiting couple times today, she denies any abdominal pain, diarrhea .   Clinical Impression  Patient demonstrates slightly labored movement for sitting up at bedside with [facility] raised, good return for completing sit to stands and step pivot transfers leaning on armrest during transfer to chair.  Patient tolerated sitting up in chair after therapy with her sister present - RN aware. Patient will benefit from continued skilled physical therapy in hospital and recommended venue below to increase strength, balance, endurance for safe ADLs and gait.           If plan is discharge home, recommend the following: A little help with walking and/or transfers;A lot of help with bathing/dressing/bathroom;Help with stairs or ramp for entrance;Assistance with cooking/housework   Can travel by private vehicle        Equipment Recommendations Rolling walker (2 wheels)  Recommendations for Other Services       Functional Status Assessment Patient has had a recent decline in their functional status and demonstrates the ability to make significant improvements in function in a reasonable and predictable  amount of time.     Precautions / Restrictions Precautions Precautions: Fall Restrictions Weight Bearing Restrictions: No      Mobility  Bed Mobility Overal bed mobility: Needs Assistance Bed Mobility: Supine to Sit     Supine to sit: Supervision, Contact guard, HOB elevated     General bed mobility comments: increased time, labored movement    Transfers Overall transfer level: Needs assistance Equipment used: 1 person hand held assist Transfers: Sit to/from Stand, Bed to chair/wheelchair/BSC Sit to Stand: Supervision   Step pivot transfers: Contact guard assist       General transfer comment: demonstrates good return for leaning on armrest of chair and completing step pivot transfer to chair    Ambulation/Gait                  Stairs            Wheelchair Mobility     Tilt Bed    Modified Rankin (Stroke Patients Only)       Balance Overall balance assessment: Needs assistance Sitting-balance support: Feet supported, No upper extremity supported Sitting balance-Leahy Scale: Fair Sitting balance - Comments: fair/good seated at EOB   Standing balance support: Reliant on assistive device for balance, During functional activity, Bilateral upper extremity supported Standing balance-Leahy Scale: Fair Standing balance comment: leaning on armrest of chair                             Pertinent Vitals/Pain Pain Assessment Pain Assessment: No/denies pain    Home Living Family/patient expects to  be discharged to:: Assisted living                 Home Equipment: Wheelchair - manual      Prior Function Prior Level of Function : Needs assist       Physical Assist : Mobility (physical);ADLs (physical) Mobility (physical): Bed mobility;Transfers;Gait   Mobility Comments: Supervised transfers to wheelchair, uses w/c for mobility ADLs Comments: Assisted by ALF staff     Extremity/Trunk Assessment   Upper Extremity  Assessment Upper Extremity Assessment: Overall WFL for tasks assessed    Lower Extremity Assessment Lower Extremity Assessment: Generalized weakness    Cervical / Trunk Assessment Cervical / Trunk Assessment: Kyphotic  Communication   Communication Communication: No apparent difficulties Cueing Techniques: Verbal cues;Tactile cues  Cognition Arousal: Alert Behavior During Therapy: WFL for tasks assessed/performed, Impulsive Overall Cognitive Status: Within Functional Limits for tasks assessed                                 General Comments: Patient is blind, appears slightly confused but able to follow directions with verbal/tactile cueing        General Comments      Exercises     Assessment/Plan    PT Assessment Patient needs continued PT services  PT Problem List Decreased strength;Decreased activity tolerance;Decreased balance;Decreased mobility       PT Treatment Interventions DME instruction;Functional mobility training;Therapeutic activities;Therapeutic exercise;Balance training;Gait training;Patient/family education    PT Goals (Current goals can be found in the Care Plan section)  Acute Rehab PT Goals Patient Stated Goal: return home with ALF staff to assist PT Goal Formulation: With patient/family Time For Goal Achievement: 04/28/23 Potential to Achieve Goals: Good    Frequency Min 3X/week     Co-evaluation               AM-PAC PT "6 Clicks" Mobility  Outcome Measure Help needed turning from your back to your side while in a flat bed without using bedrails?: A Little Help needed moving from lying on your back to sitting on the side of a flat bed without using bedrails?: A Little Help needed moving to and from a bed to a chair (including a wheelchair)?: A Little Help needed standing up from a chair using your arms (e.g., wheelchair or bedside chair)?: A Little Help needed to walk in hospital room?: A Lot Help needed climbing 3-5  steps with a railing? : A Lot 6 Click Score: 16    End of Session   Activity Tolerance: Patient tolerated treatment well;Patient limited by fatigue Patient left: in chair;with call bell/phone within reach;with family/visitor present Nurse Communication: Mobility status PT Visit Diagnosis: Unsteadiness on feet (R26.81);Other abnormalities of gait and mobility (R26.89);Muscle weakness (generalized) (M62.81)    Time: 1037-1100 PT Time Calculation (min) (ACUTE ONLY): 23 min   Charges:   PT Evaluation $PT Eval Moderate Complexity: 1 Mod PT Treatments $Therapeutic Activity: 23-37 mins PT General Charges $$ ACUTE PT VISIT: 1 Visit         2:17 PM, 04/21/23 Ocie Bob, MPT Physical Therapist with [facility] [phone] office [phone] mobile phone

## 2023-04-21 NOTE — Progress Notes (Signed)
*  PRELIMINARY RESULTS* Echocardiogram Limited 2-D Echocardiogram  has been performed.  Stacey Drain 04/21/2023, 4:37 PM

## 2023-04-21 NOTE — Plan of Care (Signed)
  Problem: Acute Rehab PT Goals(only PT should resolve) Goal: Pt Will Go Supine/Side To Sit Outcome: Progressing Flowsheets (Taken 04/21/2023 1419) Pt will go Supine/Side to Sit:  with modified independence  with supervision Goal: Patient Will Transfer Sit To/From Stand Outcome: Progressing Flowsheets (Taken 04/21/2023 1419) Patient will transfer sit to/from stand:  with modified independence  with supervision Goal: Pt Will Transfer Bed To Chair/Chair To Bed Outcome: Progressing Flowsheets (Taken 04/21/2023 1419) Pt will Transfer Bed to Chair/Chair to Bed:  with modified independence  with supervision Goal: Pt Will Ambulate Outcome: Progressing Flowsheets (Taken 04/21/2023 1419) Pt will Ambulate:  10 feet  with minimal assist  with moderate assist  with rolling walker   2:20 PM, 04/21/23 Ocie Bob, MPT Physical Therapist with [facility] [phone] office [phone] mobile phone

## 2023-04-21 NOTE — Hospital Course (Addendum)
77 y.o. female,   with a history of COPD, room air at baseline, hypertension, hyperlipidemia, hypothyroidism, CAD s/p stent placement, GERD, diabetes mellitus type 2, blindness, obesity.  She is wheelchair dependent for last year.  Sisters at bedside.  Pt is ALF resident [facility]), she was brought by the facility for dyspnea, hypoxia, at baseline patient is not on supplemental oxygen.  she was noted by facility to be dyspneic with hypoxia spO2 80% on room air, EMS were called she was found to be in A-fib with heart rate in the 130s, requiring oxygen 2 L which brings her saturation up to 90%, denies any chest pain, fever, chills, cough, she does report vomiting couple times today, she denies any abdominal pain, diarrhea. -In ED she was noted to be in A-fib with RVR, multiple attempts with IV metoprolol did not slow her heart rate, so she was started on Cardizem drip, chest x-ray significant for mild pulmonary edema, labs were significant for low normal potassium at 4.3, creatinine within normal limit at 0.8, white blood cell count 3.6, platelet at 159K her respiratory panel including COVID, and flu were negative,[facility] hospitalist consulted to admit.  She was initially started on diltiazem drip and cardiology was consulted to assist.

## 2023-04-22 DIAGNOSIS — J441 Chronic obstructive pulmonary disease with (acute) exacerbation: Secondary | ICD-10-CM

## 2023-04-22 DIAGNOSIS — I5021 Acute systolic (congestive) heart failure: Secondary | ICD-10-CM | POA: Diagnosis not present

## 2023-04-22 DIAGNOSIS — I4891 Unspecified atrial fibrillation: Secondary | ICD-10-CM | POA: Diagnosis not present

## 2023-04-22 DIAGNOSIS — I2510 Atherosclerotic heart disease of native coronary artery without angina pectoris: Secondary | ICD-10-CM | POA: Diagnosis not present

## 2023-04-22 DIAGNOSIS — N390 Urinary tract infection, site not specified: Secondary | ICD-10-CM

## 2023-04-22 LAB — GLUCOSE, CAPILLARY
Glucose-Capillary: 63 mg/dL — ABNORMAL LOW (ref 70–99)
Glucose-Capillary: 199 mg/dL — ABNORMAL HIGH (ref 70–99)
Glucose-Capillary: 192 mg/dL — ABNORMAL HIGH (ref 70–99)
Glucose-Capillary: 204 mg/dL — ABNORMAL HIGH (ref 70–99)

## 2023-04-22 MED ORDER — DIGOXIN 125 MCG PO TABS
0.1250 mg | ORAL_TABLET | Freq: Every day | ORAL | Status: DC
  Administered 2023-04-22: 0.125 mg via ORAL
  Filled 2023-04-22: qty 1

## 2023-04-22 MED ORDER — AMIODARONE HCL IN DEXTROSE 360-4.14 MG/200ML-% IV SOLN
30.0000 mg/h | INTRAVENOUS | Status: AC
  Administered 2023-04-22: 30 mg/h via INTRAVENOUS
  Filled 2023-04-22: qty 200

## 2023-04-22 MED ORDER — FUROSEMIDE 10 MG/ML IJ SOLN: 40.0000 mg | Freq: Once | INTRAMUSCULAR | Status: DC

## 2023-04-22 MED ORDER — INSULIN ASPART 100 UNIT/ML IJ SOLN
3.0000 [IU] | Freq: Three times a day (TID) | INTRAMUSCULAR | Status: DC
  Administered 2023-04-22 – 2023-04-25 (×10): 3 [IU] via SUBCUTANEOUS

## 2023-04-22 MED ORDER — MENTHOL 3 MG MT LOZG
1.0000 | LOZENGE | OROMUCOSAL | Status: DC | PRN
  Administered 2023-04-22: 3 mg via ORAL
  Filled 2023-04-22: qty 9

## 2023-04-22 MED ORDER — INSULIN GLARGINE-YFGN 100 UNIT/ML ~~LOC~~ SOLN
5.0000 [IU] | Freq: Two times a day (BID) | SUBCUTANEOUS | Status: DC
  Administered 2023-04-22 – 2023-04-24 (×5): 5 [IU] via SUBCUTANEOUS
  Filled 2023-04-22 (×8): qty 0.05

## 2023-04-22 MED ORDER — FUROSEMIDE 10 MG/ML IJ SOLN
20.0000 mg | Freq: Once | INTRAMUSCULAR | Status: AC
  Administered 2023-04-22: 20 mg via INTRAVENOUS
  Filled 2023-04-22: qty 2

## 2023-04-22 MED ORDER — AMIODARONE LOAD VIA INFUSION
150.0000 mg | Freq: Once | INTRAVENOUS | Status: AC
  Administered 2023-04-22: 150 mg via INTRAVENOUS
  Filled 2023-04-22: qty 83.34

## 2023-04-22 MED ORDER — AMIODARONE HCL IN DEXTROSE 360-4.14 MG/200ML-% IV SOLN
60.0000 mg/h | INTRAVENOUS | Status: AC
  Administered 2023-04-22 (×2): 60 mg/h via INTRAVENOUS
  Filled 2023-04-22 (×2): qty 200
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
## 2023-04-22 NOTE — Care Management Important Message (Signed)
Important Message  Patient Details  Name: [name] MRN: [id number] Date of Birth: Oct 03, 1945   Important Message Given:  Yes - Medicare IM     Corey Harold 04/22/2023, 10:48 AM

## 2023-04-22 NOTE — TOC Progression Note (Signed)
Transition of Care [facility]) - Progression Note    Patient Details  Name: [name] MRN: [id number] Date of Birth: February 16, 1946  Transition of [facility]) CM/SW Contact  Elliot Gault, LCSW Phone Number: 04/22/2023, 10:16 AM  Clinical Narrative:     TOC following. MD anticipating dc Monday as [employer] cannot re-admit pt on the weekend. Will follow.  Expected Discharge Plan: Assisted Living Barriers to Discharge: Continued Medical Work up  Expected Discharge Plan and Services     Post Acute Care Choice: Home Health Living arrangements for the past 2 months: Assisted Living Facility                           HH Arranged: PT [facility] Agency: [facility] Date HH Agency Contacted: 04/21/23 Time HH Agency Contacted: 1417 Representative spoke with at [facility] Agency: Morrie Sheldon   Social Determinants of Health (SDOH) Interventions SDOH Screenings   Food Insecurity: No Food Insecurity (04/20/2023)  Housing: Low Risk  (04/20/2023)  Transportation Needs: No Transportation Needs (04/20/2023)  Utilities: Not At Risk (04/20/2023)  Tobacco Use: Medium Risk (04/20/2023)  Health Literacy: High Risk (09/08/2020)   [facility]    Readmission Risk Interventions    04/21/2023    2:15 PM 04/21/2023    2:13 PM 02/25/2023   11:04 AM  Readmission Risk Prevention Plan  Transportation Screening Complete Complete Complete  PCP or Specialist Appt within 3-5 Days  Not Complete Not Complete  HRI or Home Care Consult Complete Complete Complete  Social Work Consult for Recovery Care Planning/Counseling Complete Complete Complete  Palliative Care Screening Not Applicable Not Applicable Not Applicable  Medication Review Oceanographer) Complete Complete Complete

## 2023-04-22 NOTE — Plan of Care (Signed)
  Problem: Education: Goal: Knowledge of General Education information will improve Description: Including pain rating scale, medication(s)/side effects and non-pharmacologic comfort measures Outcome: Progressing   Problem: Health Behavior/Discharge Planning: Goal: Ability to manage health-related needs will improve Outcome: Progressing   Problem: Clinical Measurements: Goal: Ability to maintain clinical measurements within normal limits will improve Outcome: Progressing Goal: Will remain free from infection Outcome: Progressing Goal: Diagnostic test results will improve Outcome: Progressing Goal: Respiratory complications will improve Outcome: Progressing Goal: Cardiovascular complication will be avoided Outcome: Progressing   Problem: Activity: Goal: Risk for activity intolerance will decrease Outcome: Progressing   Problem: Nutrition: Goal: Adequate nutrition will be maintained Outcome: Progressing   Problem: Coping: Goal: Level of anxiety will decrease Outcome: Progressing   Problem: Elimination: Goal: Will not experience complications related to bowel motility Outcome: Progressing Goal: Will not experience complications related to urinary retention Outcome: Progressing   Problem: Pain Management: Goal: General experience of comfort will improve Outcome: Progressing   Problem: Safety: Goal: Ability to remain free from injury will improve Outcome: Progressing   Problem: Skin Integrity: Goal: Risk for impaired skin integrity will decrease Outcome: Progressing   Problem: Education: Goal: Knowledge of disease or condition will improve Outcome: Progressing Goal: Understanding of medication regimen will improve Outcome: Progressing Goal: Individualized Educational Video(s) Outcome: Progressing   Problem: Activity: Goal: Ability to tolerate increased activity will improve Outcome: Progressing   Problem: Cardiac: Goal: Ability to achieve and maintain  adequate cardiopulmonary perfusion will improve Outcome: Progressing   Problem: Health Behavior/Discharge Planning: Goal: Ability to safely manage health-related needs after discharge will improve Outcome: Progressing   Problem: Education: Goal: Ability to describe self-care measures that may prevent or decrease complications (Diabetes Survival Skills Education) will improve Outcome: Progressing Goal: Individualized Educational Video(s) Outcome: Progressing   Problem: Coping: Goal: Ability to adjust to condition or change in health will improve Outcome: Progressing   Problem: Fluid Volume: Goal: Ability to maintain a balanced intake and output will improve Outcome: Progressing   Problem: Health Behavior/Discharge Planning: Goal: Ability to identify and utilize available resources and services will improve Outcome: Progressing Goal: Ability to manage health-related needs will improve Outcome: Progressing   Problem: Metabolic: Goal: Ability to maintain appropriate glucose levels will improve Outcome: Progressing   Problem: Nutritional: Goal: Maintenance of adequate nutrition will improve Outcome: Progressing Goal: Progress toward achieving an optimal weight will improve Outcome: Progressing   Problem: Skin Integrity: Goal: Risk for impaired skin integrity will decrease Outcome: Progressing   Problem: Tissue Perfusion: Goal: Adequacy of tissue perfusion will improve Outcome: Progressing
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
## 2023-04-23 ENCOUNTER — Inpatient Hospital Stay (HOSPITAL_COMMUNITY): Payer: Medicare Other

## 2023-04-23 DIAGNOSIS — H548 Legal blindness, as defined in USA: Secondary | ICD-10-CM

## 2023-04-23 DIAGNOSIS — I4891 Unspecified atrial fibrillation: Secondary | ICD-10-CM | POA: Diagnosis not present

## 2023-04-23 DIAGNOSIS — J441 Chronic obstructive pulmonary disease with (acute) exacerbation: Secondary | ICD-10-CM | POA: Diagnosis not present

## 2023-04-23 DIAGNOSIS — E785 Hyperlipidemia, unspecified: Secondary | ICD-10-CM | POA: Diagnosis not present

## 2023-04-23 DIAGNOSIS — J9601 Acute respiratory failure with hypoxia: Secondary | ICD-10-CM | POA: Diagnosis not present

## 2023-04-23 LAB — GLUCOSE, CAPILLARY
Glucose-Capillary: 116 mg/dL — ABNORMAL HIGH (ref 70–99)
Glucose-Capillary: 218 mg/dL — ABNORMAL HIGH (ref 70–99)
Glucose-Capillary: 249 mg/dL — ABNORMAL HIGH (ref 70–99)
Glucose-Capillary: 200 mg/dL — ABNORMAL HIGH (ref 70–99)

## 2023-04-23 LAB — BASIC METABOLIC PANEL
Anion gap: 11 (ref 5–15)
BUN: 19 mg/dL (ref 8–23)
CO2: 27 mmol/L (ref 22–32)
Calcium: 8.2 mg/dL — ABNORMAL LOW (ref 8.9–10.3)
Chloride: 99 mmol/L (ref 98–111)
Creatinine, Ser: 0.77 mg/dL (ref 0.44–1.00)
GFR, Estimated: >60 mL/min (ref >60)
Glucose, Bld: 171 mg/dL — ABNORMAL HIGH (ref 70–99)
Potassium: 3.2 mmol/L — ABNORMAL LOW (ref 3.5–5.1)
Sodium: 137 mmol/L (ref 135–145)

## 2023-04-23 LAB — MAGNESIUM: Magnesium: 1.8 mg/dL (ref 1.7–2.4)

## 2023-04-23 MED ORDER — AMLODIPINE BESYLATE 5 MG PO TABS
5.0000 mg | ORAL_TABLET | Freq: Every day | ORAL | Status: DC
  Administered 2023-04-23: 5 mg via ORAL
  Filled 2023-04-23: qty 1

## 2023-04-23 MED ORDER — POTASSIUM CHLORIDE CRYS ER 20 MEQ PO TBCR
40.0000 meq | EXTENDED_RELEASE_TABLET | Freq: Once | ORAL | Status: AC
  Administered 2023-04-23: 40 meq via ORAL
  Filled 2023-04-23: qty 2

## 2023-04-23 MED ORDER — PANTOPRAZOLE SODIUM 40 MG PO TBEC
40.0000 mg | DELAYED_RELEASE_TABLET | Freq: Every evening | ORAL | Status: DC
  Administered 2023-04-23 – 2023-04-28 (×6): 40 mg via ORAL
  Filled 2023-04-23 (×6): qty 1

## 2023-04-23 MED ORDER — AMIODARONE HCL 200 MG PO TABS
200.0000 mg | ORAL_TABLET | Freq: Two times a day (BID) | ORAL | Status: DC
  Administered 2023-04-23 – 2023-04-25 (×5): 200 mg via ORAL
  Filled 2023-04-23 (×5): qty 1

## 2023-04-23 MED ORDER — MAGNESIUM SULFATE 4 GM/100ML IV SOLN
4.0000 g | Freq: Once | INTRAVENOUS | Status: AC
  Administered 2023-04-23: 4 g via INTRAVENOUS
  Filled 2023-04-23: qty 100

## 2023-04-23 MED ORDER — SIMETHICONE 80 MG PO CHEW
80.0000 mg | CHEWABLE_TABLET | Freq: Four times a day (QID) | ORAL | Status: AC
  Administered 2023-04-23: 80 mg via ORAL
  Filled 2023-04-23: qty 1

## 2023-04-23 MED ORDER — POLYETHYLENE GLYCOL 3350 17 G PO PACK
17.0000 g | PACK | Freq: Two times a day (BID) | ORAL | Status: DC
  Administered 2023-04-23 – 2023-04-29 (×10): 17 g via ORAL
  Filled 2023-04-23 (×11): qty 1

## 2023-04-23 NOTE — Progress Notes (Signed)
PROGRESS NOTE   SANTA LURA  ZOX:[id number] DOB: Apr 26, 1946 DOA: 04/20/2023 PCP: Galvin Proffer, MD   Chief Complaint  Patient presents with   Shortness of Breath    Staff at ALF stated patient was having some shortness of breath and that her O2 was at 80% RA, EMS was called and they found patient to be in A-fib between 70-130 with an O2 of 90%.  Complaints of pain in her right shoulder denied chest pain.   Level of care: Telemetry  Brief Admission History:  77 y.o. female,   with a history of COPD, room air at baseline, hypertension, hyperlipidemia, hypothyroidism, CAD s/p stent placement, GERD, diabetes mellitus type 2, blindness, obesity.  She is wheelchair dependent for last year.  Sisters at bedside.  Pt is ALF resident, she was brought by the facility for dyspnea, hypoxia, at baseline patient with no supplemental oxygen requirement, she was noted by facility to be dyspneic with hypoxia spO2 80% on room air, EMS were called she was found to be in A-fib with heart rate in the 130s, requiring oxygen 2 L which brings her saturation up to 90%, denies any chest pain, fever, chills, cough, she does report vomiting couple times today, she denies any abdominal pain, diarrhea. -In ED she was noted to be in A-fib with RVR, multiple attempts with IV metoprolol did not slow her heart rate, so she was started on Cardizem drip, chest x-ray significant for mild pulmonary edema, labs were significant for low normal potassium at 4.3, creatinine within normal limit at 0.8, white blood cell count 3.6, platelet at 159K her respiratory panel including COVID, and flu were negative,[facility] consulted to admi   Assessment and Plan:  Acute respiratory failure with hypoxia Pulmonary edema Acute HFrEF  -Patient with hypoxia 80% on room air on presentation, with increased work of breathing, imaging significant for pulmonary edema likely due to A-fib with RVR -weaned to room air oxygen -Echo 11/21: LVEF  40-45% with RWMAs   A-fib with RVR -Known history of paroxysmal A-fib, presented in RVR, did not improve despite multiple dosing of IV metoprolol, initially started on Cardizem drip  -weaned off Cardizem drip on 11/22 -started on IV amiodarone on 11/22 and transitioned to oral amiodarone 200 mg BID 11/23 -continue apixaban for anticoagulation -appreciate cardiology team consultation and recommendations   Nausea with Vomiting - RESOLVED  -Presented with vomiting.  She was afebrile, no leukocytosis, abdominal exam is benign, will keep on as needed nausea meds and follow clinically -currently tolerating diet with no further emesis  Generalized Abdominal Pain - KUB showing a lot of gas  - added simethicone 11/23 - increased miralax to BID    COPD with mild exacerbation  -continue bronchodilators -completed 3 day course of antibiotics  Acute respiratory failure with hypoxia  - she has been weaned down to room air oxygen  - treated for mild COPD exacerbation  UTI - Treated - follow urine culture - ceftriaxone IV x 3 days completed   Hypertension -blood pressure rebounding  -metoprolol 50 mg BID  -added amlodipine 5 mg daily    Hypothyroidism -continue with home dose Synthroid -TSH has improved nicely to 3.568   Autoimmune hepatitis -Continue with Imuran, she is chronically on prednisone 10 mg daily    GERD -Continue with PPI   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  Having bowel movements.    Objective: Vitals:   04/23/23 1130 04/23/23 1200 04/23/23 1212 04/23/23 1230  BP: (!) 153/102 (!) 172/124  139/64  Pulse: (!) 101   (!) 109  Resp: 18 18  19   Temp:   97.9 F (36.6 C)   TempSrc:   Axillary   SpO2: 93%   99%  Weight:      Height:        Intake/Output Summary (Last 24 hours) at 04/23/2023 1423 Last data filed at 04/23/2023 1214 Gross per 24 hour  Intake 559.24 ml  Output 1050 ml  Net -490.76 ml   Filed Weights   04/20/23 2238  Weight: 83.4 kg   Examination:  General exam: Appears moderately distressed.  Pt is blind but hearing is good.   Respiratory system: rare expiratory wheezes heard.  Cardiovascular system: irregularly irregular, normal S1 & S2 heard. No JVD, murmurs, rubs, gallops or clicks. No pedal edema. Gastrointestinal system: Abdomen is nondistended, soft and nontender. No organomegaly or masses felt. Normal bowel sounds heard. Central nervous system: Alert and oriented. No focal neurological deficits. Extremities: Symmetric 5 x 5 power. Skin: No rashes, lesions or ulcers. Psychiatry: Judgement and insight appear normal. Mood & affect appropriate.   Data Reviewed: I have personally reviewed following labs and imaging studies  CBC: Recent Labs  Lab 04/20/23 1406 04/21/23 0435  WBC 3.6* 3.5*  HGB 10.3* 10.2*  HCT 33.3* 31.8*  MCV 114.8* 115.2*  PLT 159 163    Basic Metabolic Panel: Recent Labs  Lab 04/20/23 1406 04/21/23 0435 04/23/23 0413  NA 134* 135 137  K 4.3 4.6 3.2*  CL 102 100 99  CO2 23 24 27   GLUCOSE 134* 147* 171*  BUN 16 18 19   CREATININE 0.83 0.82 0.77  CALCIUM 8.5* 8.8* 8.2*  MG  --   --  1.8    CBG: Recent Labs  Lab 04/22/23 1157 04/22/23 1537 04/22/23 2114 04/23/23 0755 04/23/23 1152  GLUCAP 199* 192* 204* 116* 218*    Recent Results (from the past 240 hour(s))  Resp panel by RT-PCR (RSV, Flu A&B, Covid) Anterior Nasal Swab      Status: None   Collection Time: 04/20/23  1:28 PM   Specimen: Anterior Nasal Swab  Result Value Ref Range Status   SARS Coronavirus 2 by RT PCR NEGATIVE NEGATIVE Final    Comment: (NOTE) SARS-CoV-2 target nucleic acids are NOT DETECTED.  The SARS-CoV-2 RNA is generally detectable in upper respiratory specimens during the acute phase of infection. The lowest concentration of SARS-CoV-2 viral copies this assay can detect is 138 copies/mL. A negative result does not preclude SARS-Cov-2 infection and should not be used as the sole basis for treatment or other patient management decisions. A negative result may occur with  improper specimen collection/handling, submission of specimen other than nasopharyngeal swab, presence of viral mutation(s) within the areas targeted by this assay, and inadequate number of viral copies(<138 copies/mL). A negative result must be combined with clinical observations, patient history, and epidemiological information. The expected result is Negative.  Fact Sheet for Patients:  BloggerCourse.com  Fact Sheet for Healthcare Providers:  SeriousBroker.it  This test is no t yet approved or cleared by the [place] FDA and  has been authorized for detection and/or diagnosis of SARS-CoV-2 by FDA under an Emergency Use Authorization (EUA). This EUA will remain  in effect (meaning this test can be used) for the duration of the COVID-19 declaration under Section 564(b)(1) of the Act, 21 U.S.C.section 360bbb-3(b)(1), unless the authorization is terminated  or revoked sooner.       Influenza A by PCR NEGATIVE NEGATIVE Final   Influenza B by PCR NEGATIVE NEGATIVE Final    Comment: (NOTE) The Xpert Xpress SARS-CoV-2/FLU/RSV plus assay is intended as an aid in the diagnosis of influenza from Nasopharyngeal swab specimens and should not be used as a sole basis for treatment. Nasal washings and aspirates are  unacceptable for Xpert Xpress SARS-CoV-2/FLU/RSV testing.  Fact Sheet for Patients: BloggerCourse.com  Fact Sheet for Healthcare Providers: SeriousBroker.it  This test is not yet approved or cleared by the [place] FDA and has been authorized for detection and/or diagnosis of SARS-CoV-2 by FDA under an Emergency Use Authorization (EUA). This EUA will remain in effect (meaning this test can be used) for the duration of the COVID-19 declaration under Section 564(b)(1) of the Act, 21 U.S.C. section 360bbb-3(b)(1), unless the authorization is terminated or revoked.     Resp Syncytial Virus by PCR NEGATIVE NEGATIVE Final    Comment: (NOTE) Fact Sheet for Patients: BloggerCourse.com  Fact Sheet for Healthcare Providers: SeriousBroker.it  This test is not yet approved or cleared by the [place] FDA and has been authorized for detection and/or diagnosis of SARS-CoV-2 by FDA under an Emergency Use Authorization (EUA). This EUA will remain in effect (meaning this test can be used) for the duration of the COVID-19 declaration under Section 564(b)(1) of the Act, 21 U.S.C. section 360bbb-3(b)(1), unless the authorization is terminated or revoked.  Performed at [facility], [address] [place], [place] [address]   MRSA Next Gen by PCR, Nasal     Status: None   Collection Time: 04/20/23  7:55 PM   Specimen: Nasal Mucosa; Nasal Swab  Result Value Ref Range Status   MRSA by PCR Next Gen NOT DETECTED NOT DETECTED Final    Comment: (NOTE) The GeneXpert MRSA Assay (FDA approved for NASAL specimens only), is one component of a comprehensive MRSA colonization surveillance program. It is not intended to diagnose MRSA infection nor to guide or monitor treatment for MRSA infections. Test performance is not FDA approved in patients less than 30 years old. Performed at [facility], [address], [place], [place] [address]      Radiology Studies: DG Abd 1 View  Result Date: 04/23/2023 CLINICAL DATA:  Generalized abdominal pain. EXAM: ABDOMEN - 1 VIEW COMPARISON:  Abdomen and pelvis CT dated 10/06/2011 FINDINGS: Normal bowel-gas pattern. Lumbar spine degenerative changes and minimal scoliosis. No calcified urinary tract calculi seen. IMPRESSION: No acute abnormality. Electronically Signed   By: Beckie Salts M.D.   On: 04/23/2023 11:07   ECHOCARDIOGRAM LIMITED  Result Date: 04/21/2023    ECHOCARDIOGRAM LIMITED REPORT   Patient Name:   [name] Date of Exam: 04/21/2023 Medical Rec #:  [id number]       Height:       61.0 in Accession #:    [id number]      Weight:       183.9 lb Date of Birth:  [birth year]       BSA:          1.822 m Patient Age:    77 years        BP:           110/46 mmHg Patient Gender: F               HR:           90 bpm. [facility] Procedure: Limited Echo Indications:    evaluate LVEF / Atrial Fibrillation l48.91  History:        Patient has prior history of Echocardiogram examinations. COPD,                 Arrythmias:Atrial Fibrillation, Signs/Symptoms:Shortness of                 Breath; Risk Factors:Hypertension, Diabetes and Former Smoker.  Sonographer:    Celesta Gentile RCS Referring Phys: [id number] VISHNU P MALLIPEDDI IMPRESSIONS  1. Limited study.  2. Left ventricular ejection fraction, by estimation, is 45 to 50%. The left ventricle has mildly decreased function. The left ventricle demonstrates regional wall motion abnormalities (see scoring diagram/findings for description).  3. Right ventricular systolic function is normal. The right ventricular size is normal.  4. The mitral valve is degenerative.  5. The inferior vena cava is dilated in size with >50% respiratory variability, suggesting right atrial pressure of 8 mmHg. Comparison(s): Prior images reviewed side by side. LVEF mildly reduced in 45-50% range. FINDINGS  Left  Ventricle: Left ventricular ejection fraction, by estimation, is 45 to 50%. The left ventricle has mildly decreased function. The left ventricle demonstrates regional wall motion abnormalities. The left ventricular internal cavity size was normal in size. There is borderline left ventricular hypertrophy.  LV Wall Scoring: The basal inferolateral segment, basal anterolateral segment, and basal inferior segment are akinetic. The entire anterior wall, mid and distal lateral wall, entire septum, entire apex, mid and distal inferior wall, and mid anterolateral segment are hypokinetic. Right Ventricle: The right ventricular size is normal. Right ventricular systolic function is normal. Pericardium: Presence of epicardial fat layer. Mitral Valve: The mitral valve is degenerative in appearance. There is mild thickening of the mitral valve leaflet(s). Mild mitral annular calcification. Aorta: The aortic root is normal in size and structure. Venous: The inferior vena cava is dilated in size with greater than 50% respiratory variability, suggesting right atrial pressure of 8 mmHg. LEFT VENTRICLE PLAX 2D LVIDd:         5.20 cm LVIDs:         3.80 cm LV PW:         1.10 cm LV IVS:        1.00 cm LVOT diam:     1.80 cm LVOT Area:     2.54 cm  LV Volumes (MOD) LV vol d, MOD A2C: 78.1 ml LV vol d, MOD A4C: 85.3 ml LV vol s, MOD A2C: 39.4 ml LV vol s, MOD A4C: 42.3 ml LV SV MOD A2C:     38.7 ml LV SV MOD A4C:     85.3 ml LV SV MOD BP:      41.6 ml LEFT ATRIUM         Index LA diam:    4.90 cm 2.69 cm/m   AORTA Ao Root diam: 3.20 cm  SHUNTS Systemic Diam: 1.80 cm Nona Dell MD Electronically signed by Nona Dell MD Signature Date/Time: 04/21/2023/4:34:38 PM    Final  Scheduled Meds:  sodium chloride   Intravenous Once   amiodarone  200 mg Oral BID   amitriptyline  50 mg Oral QHS   amLODipine  5 mg Oral Daily   apixaban  5 mg Oral BID   azaTHIOprine  150 mg Oral Daily   Chlorhexidine Gluconate Cloth  6 each  Topical Daily   ezetimibe  10 mg Oral Daily   insulin aspart  0-15 Units Subcutaneous TID WC   insulin aspart  0-5 Units Subcutaneous QHS   insulin aspart  3 Units Subcutaneous TID WC   insulin glargine-yfgn  5 Units Subcutaneous BID   ipratropium-albuterol  3 mL Nebulization BID   levothyroxine  100 mcg Oral QAC breakfast   lidocaine  1 patch Transdermal Q24H   metoprolol tartrate  50 mg Oral BID   polyethylene glycol  17 g Oral Daily   predniSONE  10 mg Oral Daily   sertraline  50 mg Oral Daily   Continuous Infusions:  magnesium sulfate bolus IVPB 4 g (04/23/23 1306)    LOS: 3 days   Critical Care Procedure Note Authorized and Performed by: Maryln Manuel MD  Total Critical Care time:  56 mins Due to a high probability of clinically significant, life threatening deterioration, the patient required my highest level of preparedness to intervene emergently and I personally spent this critical care time directly and personally managing the patient.  This critical care time included obtaining a history; examining the patient, pulse oximetry; ordering and review of studies; arranging urgent treatment with development of a management plan; evaluation of patient's response of treatment; frequent reassessment; and discussions with other providers.  This critical care time was performed to assess and manage the high probability of imminent and life threatening deterioration that could result in multi-organ failure.  It was exclusive of separately billable procedures and treating other patients and teaching time.   Standley Dakins, MD How to contact the [facility] Attending or Consulting provider 7A - 7P or covering provider during after hours 7P -7A, for this patient?  Check the care team in [facility] and look for a) attending/consulting [facility] provider listed and b) the [facility] team listed Log into [email or web address] to find provider on call.  Locate the [facility] provider you are looking for under [facility] and page to  a number that you can be directly reached. If you still have difficulty reaching the provider, please page the [facility] (Director on Call) for the Hospitalists listed on amion for assistance.  04/23/2023, 2:23 PM

## 2023-04-24 DIAGNOSIS — I5021 Acute systolic (congestive) heart failure: Secondary | ICD-10-CM | POA: Diagnosis not present

## 2023-04-24 DIAGNOSIS — I2510 Atherosclerotic heart disease of native coronary artery without angina pectoris: Secondary | ICD-10-CM

## 2023-04-24 DIAGNOSIS — I4891 Unspecified atrial fibrillation: Secondary | ICD-10-CM | POA: Diagnosis not present

## 2023-04-24 LAB — GLUCOSE, CAPILLARY
Glucose-Capillary: 132 mg/dL — ABNORMAL HIGH (ref 70–99)
Glucose-Capillary: 205 mg/dL — ABNORMAL HIGH (ref 70–99)
Glucose-Capillary: 192 mg/dL — ABNORMAL HIGH (ref 70–99)
Glucose-Capillary: 172 mg/dL — ABNORMAL HIGH (ref 70–99)

## 2023-04-24 MED ORDER — METOPROLOL TARTRATE 50 MG PO TABS
75.0000 mg | ORAL_TABLET | Freq: Two times a day (BID) | ORAL | Status: DC
  Administered 2023-04-24 – 2023-04-26 (×4): 75 mg via ORAL
  Filled 2023-04-24 (×5): qty 1

## 2023-04-24 NOTE — Progress Notes (Signed)
PROGRESS NOTE   [name]  ZOX:[id number] DOB: 07-24-1945 DOA: 04/20/2023 PCP: Galvin Proffer, MD   Chief Complaint  Patient presents with   Shortness of Breath    Staff at ALF stated patient was having some shortness of breath and that her O2 was at 80% RA, EMS was called and they found patient to be in A-fib between 70-130 with an O2 of 90%.  Complaints of pain in her right shoulder denied chest pain.   Level of care: Telemetry  Brief Admission History:  77 y.o. female,   with a history of COPD, room air at baseline, hypertension, hyperlipidemia, hypothyroidism, CAD s/p stent placement, GERD, diabetes mellitus type 2, blindness, obesity.  She is wheelchair dependent for last year.  Sisters at bedside.  Pt is ALF resident, she was brought by the facility for dyspnea, hypoxia, at baseline patient with no supplemental oxygen requirement, she was noted by facility to be dyspneic with hypoxia spO2 80% on room air, EMS were called she was found to be in A-fib with heart rate in the 130s, requiring oxygen 2 L which brings her saturation up to 90%, denies any chest pain, fever, chills, cough, she does report vomiting couple times today, she denies any abdominal pain, diarrhea. -In ED she was noted to be in A-fib with RVR, multiple attempts with IV metoprolol did not slow her heart rate, so she was started on Cardizem drip, chest x-ray significant for mild pulmonary edema, labs were significant for low normal potassium at 4.3, creatinine within normal limit at 0.8, white blood cell count 3.6, platelet at 159K her respiratory panel including COVID, and flu were negative,[facility] hospitalist consulted to admi   Assessment and Plan:  Acute respiratory failure with hypoxia Pulmonary edema Acute HFrEF  -Patient with hypoxia 80% on room air on presentation, with increased work of breathing, imaging significant for pulmonary edema likely due to A-fib with RVR -weaned to room air oxygen -Echo 11/21: LVEF  40-45% with RWMAs -she was treated with 2 doses of IV furosemide   Intake/Output Summary (Last 24 hours) at 04/24/2023 1125 Last data filed at 04/24/2023 0901 Gross per 24 hour  Intake 760 ml  Output 800 ml  Net -40 ml   Added daily weights and obtain high quality ReDS vest reading  A-fib with RVR -Known history of paroxysmal A-fib, presented in RVR, did not improve despite multiple dosing of IV metoprolol, initially started on Cardizem drip  -weaned off Cardizem drip on 11/22 -started on IV amiodarone on 11/22 and transitioned to oral amiodarone 200 mg BID 11/23 per recs -continue apixaban for anticoagulation -appreciate cardiology team consultation and recommendations   Nausea with Vomiting - RESOLVED  -Presented with vomiting.  She was afebrile, no leukocytosis, abdominal exam is benign, will keep on as needed nausea meds and follow clinically -currently tolerating diet with no further emesis  Generalized Abdominal Pain - improved  - KUB showing a lot of gas  - added simethicone 11/23 - increased miralax to BID  - no BM yesterday but feels like soon to patient   COPD with mild exacerbation  -continue bronchodilators -completed 3 day course of antibiotics  Acute respiratory failure with hypoxia  - she has been weaned down to room air oxygen  - treated for mild COPD exacerbation  UTI - Treated - follow urine culture - ceftriaxone IV x 3 days completed   Hypertension  -suboptimally controlled -increased metoprolol to 75 mg BID on 11/24   Hypothyroidism -continue  with home dose Synthroid -TSH has improved nicely to 3.568   Autoimmune hepatitis -Continue with Imuran, she is chronically on prednisone 10 mg daily    GERD -Continue with PPI   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     Objective: Vitals:   04/24/23 0715 04/24/23 0718 04/24/23 0720 04/24/23 0828  BP:      Pulse: (!) 125     Resp: (!) 24 (!) 26 (!) 22   Temp:    97.6 F (36.4 C)  TempSrc:    Oral  SpO2: 95%     Weight:      Height:        Intake/Output Summary (Last 24 hours) at 04/24/2023 1122 Last data filed at 04/24/2023 0901 Gross per 24 hour  Intake 760 ml  Output 800 ml  Net -40 ml   Filed Weights   04/20/23 2238  Weight: 83.4 kg   Examination:  General exam: Appears moderately distressed.  Pt is blind but hearing is good.   Respiratory system: rare expiratory wheezes heard.  Cardiovascular system: irregularly irregular, tachycardic normal S1 & S2 heard. No JVD, murmurs, rubs, gallops or clicks. No pedal edema. Gastrointestinal system: Abdomen is nondistended, soft and nontender. No organomegaly or masses felt. Normal bowel sounds heard. Central nervous system: Alert and oriented. No focal neurological deficits. Extremities: Symmetric 5 x 5 power. Skin: No rashes, lesions or ulcers. Psychiatry: Judgement and insight appear normal. Mood & affect appropriate.   Data Reviewed: I have personally reviewed following labs and imaging studies  CBC: Recent Labs  Lab 04/20/23 1406 04/21/23 0435  WBC 3.6* 3.5*  HGB 10.3* 10.2*  HCT 33.3* 31.8*  MCV 114.8* 115.2*  PLT 159 163    Basic Metabolic Panel: Recent Labs  Lab 04/20/23 1406 04/21/23 0435 04/23/23 0413  NA 134* 135 137  K 4.3 4.6 3.2*  CL 102 100 99  CO2 23 24 27   GLUCOSE 134* 147* 171*  BUN 16 18 19    CREATININE 0.83 0.82 0.77  CALCIUM 8.5* 8.8* 8.2*  MG  --   --  1.8    CBG: Recent Labs  Lab 04/23/23 0755 04/23/23 1152 04/23/23 1710 04/23/23 2216 04/24/23 0802  GLUCAP 116* 218* 249* 200* 132*    Recent Results (from the past 240 hour(s))  Resp panel by RT-PCR (RSV, Flu A&B, Covid) Anterior Nasal Swab     Status: None   Collection Time: 04/20/23  1:28 PM   Specimen: Anterior Nasal Swab  Result Value Ref Range Status   SARS Coronavirus 2 by RT PCR NEGATIVE NEGATIVE Final    Comment: (NOTE) SARS-CoV-2 target nucleic acids are NOT DETECTED.  The SARS-CoV-2 RNA is generally detectable in upper respiratory specimens during the acute phase of infection. The lowest concentration of SARS-CoV-2 viral copies this assay can detect is 138 copies/mL. A negative result does not preclude SARS-Cov-2 infection and should not be used  as the sole basis for treatment or other patient management decisions. A negative result may occur with  improper specimen collection/handling, submission of specimen other than nasopharyngeal swab, presence of viral mutation(s) within the areas targeted by this assay, and inadequate number of viral copies(<138 copies/mL). A negative result must be combined with clinical observations, patient history, and epidemiological information. The expected result is Negative.  Fact Sheet for Patients:  BloggerCourse.com  Fact Sheet for Healthcare Providers:  SeriousBroker.it  This test is no t yet approved or cleared by the [place] FDA and  has been authorized for detection and/or diagnosis of SARS-CoV-2 by FDA under an Emergency Use Authorization (EUA). This EUA will remain  in effect (meaning this test can be used) for the duration of the COVID-19 declaration under Section 564(b)(1) of the Act, 21 U.S.C.section 360bbb-3(b)(1), unless the authorization is terminated  or revoked sooner.       Influenza  A by PCR NEGATIVE NEGATIVE Final   Influenza B by PCR NEGATIVE NEGATIVE Final    Comment: (NOTE) The Xpert Xpress SARS-CoV-2/FLU/RSV plus assay is intended as an aid in the diagnosis of influenza from Nasopharyngeal swab specimens and should not be used as a sole basis for treatment. Nasal washings and aspirates are unacceptable for Xpert Xpress SARS-CoV-2/FLU/RSV testing.  Fact Sheet for Patients: BloggerCourse.com  Fact Sheet for Healthcare Providers: SeriousBroker.it  This test is not yet approved or cleared by the [place] FDA and has been authorized for detection and/or diagnosis of SARS-CoV-2 by FDA under an Emergency Use Authorization (EUA). This EUA will remain in effect (meaning this test can be used) for the duration of the COVID-19 declaration under Section 564(b)(1) of the Act, 21 U.S.C. section 360bbb-3(b)(1), unless the authorization is terminated or revoked.     Resp Syncytial Virus by PCR NEGATIVE NEGATIVE Final    Comment: (NOTE) Fact Sheet for Patients: BloggerCourse.com  Fact Sheet for Healthcare Providers: SeriousBroker.it  This test is not yet approved or cleared by the [place] FDA and has been authorized for detection and/or diagnosis of SARS-CoV-2 by FDA under an Emergency Use Authorization (EUA). This EUA will remain in effect (meaning this test can be used) for the duration of the COVID-19 declaration under Section 564(b)(1) of the Act, 21 U.S.C. section 360bbb-3(b)(1), unless the authorization is terminated or revoked.  Performed at [facility], [address], [place], [place] [address]   MRSA Next Gen by PCR, Nasal     Status: None   Collection Time: 04/20/23  7:55 PM   Specimen: Nasal Mucosa; Nasal Swab  Result Value Ref Range Status   MRSA by PCR Next Gen NOT DETECTED NOT DETECTED Final    Comment: (NOTE) The GeneXpert MRSA Assay  (FDA approved for NASAL specimens only), is one component of a comprehensive MRSA colonization surveillance program. It is not intended to diagnose MRSA infection nor to guide or monitor treatment for MRSA infections. Test performance is not FDA approved in patients less than 7 years old. Performed at [facility], [address] [place], [place] [address]      Radiology Studies: DG Abd 1 View  Result Date: 04/23/2023 CLINICAL DATA:  Generalized abdominal pain. EXAM: ABDOMEN - 1 VIEW COMPARISON:  Abdomen and pelvis CT dated 10/06/2011 FINDINGS: Normal bowel-gas pattern. Lumbar spine degenerative changes and minimal scoliosis. No calcified urinary tract calculi seen. IMPRESSION: No acute abnormality. Electronically Signed   By: Beckie Salts M.D.   On: 04/23/2023 11:07    Scheduled Meds:  sodium chloride   Intravenous Once   amiodarone  200 mg Oral BID   amitriptyline  50 mg Oral QHS   apixaban  5 mg Oral BID   azaTHIOprine  150 mg Oral Daily   Chlorhexidine Gluconate Cloth  6 each Topical Daily   ezetimibe  10 mg Oral Daily   insulin aspart  0-15 Units Subcutaneous TID WC   insulin aspart  0-5 Units Subcutaneous QHS   insulin aspart  3 Units Subcutaneous TID WC   insulin glargine-yfgn  5 Units Subcutaneous BID   levothyroxine  100 mcg Oral QAC breakfast   lidocaine  1 patch Transdermal Q24H   metoprolol tartrate  75 mg Oral BID   pantoprazole  40 mg Oral QPM   polyethylene glycol  17 g Oral BID   predniSONE  10 mg Oral Daily   sertraline  50 mg Oral Daily   Continuous Infusions:    LOS: 4 days   Time spent: 57 mins  Dalisha Shively Laural Benes, MD How to contact the [facility] Attending or Consulting provider 7A - 7P or covering provider during after hours 7P -7A, for this patient?  Check the care team in [facility] and look for a) attending/consulting [facility] provider listed and b) the [facility] team listed Log into [email or web address] to find provider on call.  Locate the [facility] provider you are looking for  under [facility] and page to a number that you can be directly reached. If you still have difficulty reaching the provider, please page the [facility] (Director on Call) for the Hospitalists listed on amion for assistance.  04/24/2023, 11:22 AM

## 2023-04-25 ENCOUNTER — Ambulatory Visit (HOSPITAL_COMMUNITY): Admission: RE | Admit: 2023-04-25 | Payer: Medicare Other | Source: Ambulatory Visit

## 2023-04-25 DIAGNOSIS — E162 Hypoglycemia, unspecified: Secondary | ICD-10-CM | POA: Diagnosis not present

## 2023-04-25 DIAGNOSIS — N390 Urinary tract infection, site not specified: Secondary | ICD-10-CM | POA: Diagnosis not present

## 2023-04-25 DIAGNOSIS — I4891 Unspecified atrial fibrillation: Secondary | ICD-10-CM | POA: Diagnosis not present

## 2023-04-25 DIAGNOSIS — E119 Type 2 diabetes mellitus without complications: Secondary | ICD-10-CM | POA: Diagnosis not present

## 2023-04-25 LAB — GLUCOSE, CAPILLARY
Glucose-Capillary: 61 mg/dL — ABNORMAL LOW (ref 70–99)
Glucose-Capillary: 117 mg/dL — ABNORMAL HIGH (ref 70–99)
Glucose-Capillary: 156 mg/dL — ABNORMAL HIGH (ref 70–99)
Glucose-Capillary: 152 mg/dL — ABNORMAL HIGH (ref 70–99)

## 2023-04-25 LAB — BASIC METABOLIC PANEL
Anion gap: 11 (ref 5–15)
BUN: 14 mg/dL (ref 8–23)
CO2: 28 mmol/L (ref 22–32)
Calcium: 8.6 mg/dL — ABNORMAL LOW (ref 8.9–10.3)
Chloride: 102 mmol/L (ref 98–111)
Creatinine, Ser: 0.69 mg/dL (ref 0.44–1.00)
GFR, Estimated: >60 mL/min (ref >60)
Glucose, Bld: 72 mg/dL (ref 70–99)
Potassium: 3.4 mmol/L — ABNORMAL LOW (ref 3.5–5.1)
Sodium: 141 mmol/L (ref 135–145)

## 2023-04-25 LAB — TYPE AND SCREEN
ABO/RH(D): B POS
Antibody Screen: NEGATIVE
Unit division: 0

## 2023-04-25 LAB — MAGNESIUM: Magnesium: 1.8 mg/dL (ref 1.7–2.4)

## 2023-04-25 LAB — BPAM RBC
Blood Product Expiration Date: 202412032359
Unit Type and Rh: 1700

## 2023-04-25 MED ORDER — INSULIN ASPART 100 UNIT/ML IJ SOLN
0.0000 [IU] | Freq: Three times a day (TID) | INTRAMUSCULAR | Status: DC
  Administered 2023-04-25: 2 [IU] via SUBCUTANEOUS
  Administered 2023-04-26: 5 [IU] via SUBCUTANEOUS
  Administered 2023-04-26: 3 [IU] via SUBCUTANEOUS
  Administered 2023-04-27: 1 [IU] via SUBCUTANEOUS
  Administered 2023-04-27: 5 [IU] via SUBCUTANEOUS
  Administered 2023-04-27: 3 [IU] via SUBCUTANEOUS
  Administered 2023-04-28: 2 [IU] via SUBCUTANEOUS
  Administered 2023-04-28: 3 [IU] via SUBCUTANEOUS
  Administered 2023-04-28: 7 [IU] via SUBCUTANEOUS
  Administered 2023-04-29: 3 [IU] via SUBCUTANEOUS
  Administered 2023-04-29: 1 [IU] via SUBCUTANEOUS

## 2023-04-25 MED ORDER — LOSARTAN POTASSIUM 25 MG PO TABS
25.0000 mg | ORAL_TABLET | Freq: Every day | ORAL | Status: DC
  Administered 2023-04-25 – 2023-04-26 (×2): 25 mg via ORAL
  Filled 2023-04-25 (×2): qty 1

## 2023-04-25 MED ORDER — INSULIN GLARGINE-YFGN 100 UNIT/ML ~~LOC~~ SOLN
5.0000 [IU] | Freq: Every day | SUBCUTANEOUS | Status: DC
  Administered 2023-04-26 – 2023-04-28 (×3): 5 [IU] via SUBCUTANEOUS
  Filled 2023-04-25 (×4): qty 0.05

## 2023-04-25 MED ORDER — AMIODARONE HCL IN DEXTROSE 360-4.14 MG/200ML-% IV SOLN
30.0000 mg/h | INTRAVENOUS | Status: DC
  Administered 2023-04-25 – 2023-04-26 (×4): 30 mg/h via INTRAVENOUS
  Filled 2023-04-25 (×4): qty 200

## 2023-04-25 MED ORDER — AMIODARONE LOAD VIA INFUSION
150.0000 mg | Freq: Once | INTRAVENOUS | Status: AC
  Administered 2023-04-25: 150 mg via INTRAVENOUS
  Filled 2023-04-25: qty 83.34

## 2023-04-25 MED ORDER — PROCHLORPERAZINE EDISYLATE 10 MG/2ML IJ SOLN
10.0000 mg | INTRAMUSCULAR | Status: DC | PRN
  Administered 2023-04-25: 10 mg via INTRAVENOUS
  Filled 2023-04-25: qty 2

## 2023-04-25 MED ORDER — ALPRAZOLAM 0.25 MG PO TABS
0.2500 mg | ORAL_TABLET | Freq: Three times a day (TID) | ORAL | Status: DC | PRN
  Administered 2023-04-27 – 2023-04-28 (×3): 0.25 mg via ORAL
  Filled 2023-04-25 (×3): qty 1

## 2023-04-25 MED ORDER — INSULIN ASPART 100 UNIT/ML IJ SOLN
2.0000 [IU] | Freq: Three times a day (TID) | INTRAMUSCULAR | Status: DC
  Administered 2023-04-25 – 2023-04-29 (×12): 2 [IU] via SUBCUTANEOUS

## 2023-04-25 MED ORDER — MAGNESIUM SULFATE 2 GM/50ML IV SOLN
2.0000 g | Freq: Once | INTRAVENOUS | Status: AC
  Administered 2023-04-25: 2 g via INTRAVENOUS
  Filled 2023-04-25: qty 50

## 2023-04-25 MED ORDER — POTASSIUM CHLORIDE CRYS ER 20 MEQ PO TBCR
40.0000 meq | EXTENDED_RELEASE_TABLET | Freq: Once | ORAL | Status: AC
  Administered 2023-04-25: 40 meq via ORAL
  Filled 2023-04-25: qty 2

## 2023-04-25 NOTE — Inpatient Diabetes Management (Signed)
Inpatient Diabetes Program Recommendations  AACE/ADA: New Consensus Statement on Inpatient Glycemic Control (2015)  Target Ranges:  Prepandial:   less than 140 mg/dL      Peak postprandial:   less than 180 mg/dL (1-2 hours)      Critically ill patients:  140 - 180 mg/dL   Lab Results  Component Value Date   GLUCAP 117 (H) 04/25/2023   HGBA1C 8.4 (H) 02/25/2023    Latest Reference Range & Units 04/24/23 08:02 04/24/23 11:50 04/24/23 17:13 04/24/23 21:04 04/25/23 08:06 04/25/23 08:50  Glucose-Capillary 70 - 99 mg/dL 161 (H) 096 (H) 045 (H) 172 (H) 61 (L) 117 (H)  (H): Data is abnormally high (L): Data is abnormally low  Review of Glycemic Control  Diabetes history: DM Outpatient Diabetes medications: Lantus 6 units BID, Humalog 2-15 units TID, Glucotrol 5 mg BID  Current orders for Inpatient glycemic control: Semglee 5 units BID, Novolog 0-15 units TID, HS scale, Novolog 3 units TID, Prednisone 10 mg daily  Inpatient Diabetes Program Recommendations:   May consider: -Decrease Novolog correction to 0-9 units tid, 0-5 units hs  Thank you, Darel Hong E. Lauriana Denes, RN, MSN, CDCES  Diabetes Coordinator Inpatient Glycemic Control Team Team Pager [phone] (8am-5pm) 04/25/2023 9:41 AM

## 2023-04-25 NOTE — Progress Notes (Signed)
Rounding Note    Patient Name: [name] Date of Encounter: 04/25/2023  [facility] Cardiologist: Dina Rich, MD   Subjective   No complaints  Inpatient Medications    Scheduled Meds:  sodium chloride   Intravenous Once   amiodarone  200 mg Oral BID   amitriptyline  50 mg Oral QHS   apixaban  5 mg Oral BID   azaTHIOprine  150 mg Oral Daily   Chlorhexidine Gluconate Cloth  6 each Topical Daily   ezetimibe  10 mg Oral Daily   insulin aspart  0-15 Units Subcutaneous TID WC   insulin aspart  0-5 Units Subcutaneous QHS   insulin aspart  3 Units Subcutaneous TID WC   insulin glargine-yfgn  5 Units Subcutaneous BID   levothyroxine  100 mcg Oral QAC breakfast   lidocaine  1 patch Transdermal Q24H   metoprolol tartrate  75 mg Oral BID   pantoprazole  40 mg Oral QPM   polyethylene glycol  17 g Oral BID   predniSONE  10 mg Oral Daily   sertraline  50 mg Oral Daily   Continuous Infusions:  magnesium sulfate bolus IVPB 2 g (04/25/23 0812)   PRN Meds: acetaminophen **OR** acetaminophen, albuterol, menthol-cetylpyridinium   Vital Signs    Vitals:   04/25/23 0100 04/25/23 0418 04/25/23 0808 04/25/23 0818  BP:   (!) 156/78   Pulse:   (!) 118   Resp:      Temp:  97.9 F (36.6 C)  97.8 F (36.6 C)  TempSrc:  Axillary  Axillary  SpO2:      Weight: 82.3 kg     Height: 5\' 1"  (1.549 m)       Intake/Output Summary (Last 24 hours) at 04/25/2023 0832 Last data filed at 04/24/2023 2105 Gross per 24 hour  Intake 560 ml  Output 550 ml  Net 10 ml      04/25/2023    1:00 AM 04/20/2023   10:38 PM 02/24/2023    8:54 PM  Last 3 Weights  Weight (lbs) 181 lb 7 oz 183 lb 13.8 oz 182 lb 12.2 oz  Weight (kg) 82.3 kg 83.4 kg 82.9 kg      Telemetry    Afib variable rates - Personally Reviewed  ECG    N/a - Personally Reviewed  Physical Exam   GEN: No acute distress.   Neck: No JVD Cardiac: irreg Respiratory: Clear to auscultation  bilaterally. GI: Soft, nontender, non-distended  MS: No edema; No deformity. Neuro:  Nonfocal  Psych: Normal affect   Labs    High Sensitivity Troponin:   Recent Labs  Lab 04/20/23 1406 04/20/23 1629  TROPONINIHS 11 9     Chemistry Recent Labs  Lab 04/20/23 1406 04/21/23 0435 04/23/23 0413 04/25/23 0525  NA 134* 135 137 141  K 4.3 4.6 3.2* 3.4*  CL 102 100 99 102  CO2 23 24 27 28   GLUCOSE 134* 147* 171* 72  BUN 16 18 19 14   CREATININE 0.83 0.82 0.77 0.69  CALCIUM 8.5* 8.8* 8.2* 8.6*  MG  --   --  1.8 1.8  PROT 6.7  --   --   --   ALBUMIN 3.2*  --   --   --   AST 64*  --   --   --   ALT 31  --   --   --   ALKPHOS 80  --   --   --   BILITOT 0.7  --   --   --  GFRNONAA >60 >60 >60 >60  ANIONGAP 9 11 11 11     Lipids No results for input(s): "CHOL", "TRIG", "HDL", "LABVLDL", "LDLCALC", "CHOLHDL" in the last 168 hours.  Hematology Recent Labs  Lab 04/20/23 1406 04/21/23 0435  WBC 3.6* 3.5*  RBC 2.90* 2.76*  HGB 10.3* 10.2*  HCT 33.3* 31.8*  MCV 114.8* 115.2*  MCH 35.5* 37.0*  MCHC 30.9 32.1  RDW 17.5* 17.6*  PLT 159 163   Thyroid  Recent Labs  Lab 04/21/23 0435  TSH 3.568    BNP Recent Labs  Lab 04/21/23 0435  BNP 539.0*    DDimer No results for input(s): "DDIMER" in the last 168 hours.   Radiology    DG Abd 1 View  Result Date: 04/23/2023 CLINICAL DATA:  Generalized abdominal pain. EXAM: ABDOMEN - 1 VIEW COMPARISON:  Abdomen and pelvis CT dated 10/06/2011 FINDINGS: Normal bowel-gas pattern. Lumbar spine degenerative changes and minimal scoliosis. No calcified urinary tract calculi seen. IMPRESSION: No acute abnormality. Electronically Signed   By: Beckie Salts M.D.   On: 04/23/2023 11:07    Cardiac Studies     Patient Profile     77 y.o. female ith a hx of CAD (s/p BMS to LCx in 2003, DESx3 to LCx, OM3 and RCA in 2006), HTN, HLD, Type 2 DM and paroxysmal atrial fibrillation (diagnosed in 08/2022 during admission for PNA and not placed on  Amiodarone due to abnormal TSH) who is currently admitted for atrial fibrillation with RVR.   Assessment & Plan    1.PAF - issues with afib with RVR this admission - initially on IV Cardizem and Lopressor 50 mg twice daily. Issues with control, transitioned to IV amiodarone then transitoned to oral 200mg  bid. Also on lopressor 75mg  bid.  - prior abnormal TSH that has resolved, monitor while on amio - from review got IV amio boluse 11/22 then on drip for 24 hours, changed to oral 200mg  bid.  - ideally would chemically convert on amio, will restart IV amio for additional load, she remains in afib with variable rates. Amio 150mg  bolus, then drip at 30mg /hr - she is on eliquis 5mg  bid for stroke prevention   2.Acute HFmrEF - 08/2022 echo: LVEF 50-55% - 04/2023 echo limited: LVEF 45-50% - CXR mild edema, BNP 539  - slight decline in LVEF and HF exacerbation likely rate related - has been diuresed, appears euvolemic.  - can transition lopressor to toprol once rate controlled. Add losartan 25mg  daily. WIth low normal to mildly reduced LVEF likely tachy mediated I would not neccesarily commit to aggressive extensive  long term HFrEF medical regimen. Control rhythm and repeat echo over next few weeks, if persistent or further decline could be more aggressive at that point.    3. CAD - She is s/p BMS to LCx in 2003 and DESx3 to LCx, OM3 and RCA in 2006. Troponin values have been negative this admission. History is limited given her AMS but no reported chest pain at this time.  - Not on ASA given the need for anticoagulation and previously had elevated LFT's with statin therapy.   For questions or updates, please contact [facility] Please consult [email or web address] for contact info under        Signed, Dina Rich, MD  04/25/2023, 8:32 AM

## 2023-04-25 NOTE — Progress Notes (Addendum)
Pt. Refusing care from nursing staff. Nurse attempted to recheck pt.'s BP due to BP not reading properly, take pt.'s temperature, perform CBG check and give scheduled medications. Pt. Adamantly refused telling nurse to get out of the room while using strong curse words. Nurse provided education to patient regarding care that nurse is attempting to provide. Pt. Stated she doesn't care and that nurse can take medications herself. Nurse attempted to ask orientation questions and pt. Would not answer questions. When asked pt.'s name and DOB, pt. Said, "I know my name, do you know yours." Pt. Then proceeded to say, "I know that I am in the hospital, do you know where you are at?" Pt. Became increasing agitated at questions being asked and care that was attempting to be provided and yelled at nurse to leave her room. Nurse will attempt again later on to see if pt. Becomes more cooperative.   Around 8:00pm: Nurse attempted to perform nursing care to pt. Again and pt. Refused strongly.    Around 8:30pm: Nurse asked another staff nurse to attempt to recheck pt.'s BP, get temperature and CBG check and pt. Strongly refused. Pt. Seems to be in a bad mood and is uncooperative with nursing care. Nurse attempted to educate patient again on care that is attempting to be provided and again pt. Refused strongly telling staff to leave her alone and to get out of her room. MD notified.

## 2023-04-25 NOTE — Progress Notes (Addendum)
PROGRESS NOTE   [name]  JSE:[id number] DOB: May 13, 1946 DOA: 04/20/2023 PCP: Galvin Proffer, MD   Chief Complaint  Patient presents with   Shortness of Breath    Staff at ALF stated patient was having some shortness of breath and that her O2 was at 80% RA, EMS was called and they found patient to be in A-fib between 70-130 with an O2 of 90%.  Complaints of pain in her right shoulder denied chest pain.   Level of care: Stepdown  Brief Admission History:  77 y.o. female,   with a history of COPD, room air at baseline, hypertension, hyperlipidemia, hypothyroidism, CAD s/p stent placement, GERD, diabetes mellitus type 2, blindness, obesity.  She is wheelchair dependent for last year.  Sisters at bedside.  Pt is ALF resident, she was brought by the facility for dyspnea, hypoxia, at baseline patient with no supplemental oxygen requirement, she was noted by facility to be dyspneic with hypoxia spO2 80% on room air, EMS were called she was found to be in A-fib with heart rate in the 130s, requiring oxygen 2 L which brings her saturation up to 90%, denies any chest pain, fever, chills, cough, she does report vomiting couple times today, she denies any abdominal pain, diarrhea. -In ED she was noted to be in A-fib with RVR, multiple attempts with IV metoprolol did not slow her heart rate, so she was started on Cardizem drip, chest x-ray significant for mild pulmonary edema, labs were significant for low normal potassium at 4.3, creatinine within normal limit at 0.8, white blood cell count 3.6, platelet at 159K her respiratory panel including COVID, and flu were negative,[facility] hospitalist consulted to admi   Assessment and Plan:  Acute respiratory failure with hypoxia Pulmonary edema Acute HFrEF  -Patient with hypoxia 80% on room air on presentation, with increased work of breathing, imaging significant for pulmonary edema likely due to A-fib with RVR -weaned to room air oxygen -Echo 11/21: LVEF  40-45% with RWMAs -she was treated with 2 doses of IV furosemide   Intake/Output Summary (Last 24 hours) at 04/25/2023 1250 Last data filed at 04/25/2023 0737 Gross per 24 hour  Intake 480 ml  Output 550 ml  Net -70 ml   Added daily weights and obtain high quality ReDS vest reading  A-fib with RVR -Known history of paroxysmal A-fib, presented in RVR, did not improve despite multiple dosing of IV metoprolol, initially started on Cardizem drip  -weaned off Cardizem drip on 11/22 -started on IV amiodarone on 11/22 and transitioned to oral amiodarone 200 mg BID 11/23 per recs -continue apixaban for anticoagulation -appreciate cardiology team consultation and recommendations -started back on IV amiodarone 11/25 due to recurrent Afib RVR   Nausea with Vomiting - RESOLVED  -Presented with vomiting.  She was afebrile, no leukocytosis, abdominal exam is benign, will keep on as needed nausea meds and follow clinically -currently tolerating diet with no further emesis  Generalized Abdominal Pain - improved  constipation - KUB showing a lot of gas  - added simethicone 11/23 - increased miralax to BID  - small BM yesterday, abd pain better    COPD with mild exacerbation  -continue bronchodilators -completed 3 day course of antibiotics  Acute respiratory failure with hypoxia  - she has been weaned down to room air oxygen  - treated for mild COPD exacerbation  UTI - Treated - follow urine culture - ceftriaxone IV x 3 days completed   Hypertension  -suboptimally controlled -increased metoprolol  to 75 mg BID on 11/24   Hypothyroidism -continue with home dose Synthroid -TSH has improved nicely to 3.568   Autoimmune hepatitis -Continue with Imuran, she is chronically on prednisone 10 mg daily    GERD -Continue with PPI   Hypoglycemia from insulin - reduced basal and SSI coverage 11/25 - continue close monitoring of CBG  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   -semglee 5 units daily with prandial novolog 2 units TID with meals -A1c 8.4%  -CBG 5x/day  CBG (last 3)  Recent Labs    04/25/23 0806 04/25/23 0850 04/25/23 1141  GLUCAP 61* 117* 156*    Obesity -BMI during recent hospitalization of 38.24 kg/m   DVT prophylaxis: apixaban Code Status: Full  Family Communication: sisters at bedside 11/21, 11/23, t/c to sister 11/25 spoke w/husband Disposition: anticipate return to ALF when medically stabilized    Consultants:  cardiology  Procedures:   Antimicrobials:    Subjective: Pt back on IV amiodarone infusion due to afib RVR, remains constipated but abdominal pain is better;     Objective: Vitals:   04/25/23 0818 04/25/23 1000 04/25/23 1113 04/25/23 1142  BP:  (!) 147/84    Pulse:  88 84   Resp:  17 16   Temp: 97.8 F (36.6 C)   97.9 F (36.6 C)  TempSrc: Axillary   Oral  SpO2:  98% 96%   Weight:      Height:        Intake/Output Summary (Last 24 hours) at 04/25/2023 1250 Last data filed at 04/25/2023 0921 Gross per 24 hour  Intake 480 ml  Output 550 ml  Net -70 ml   Filed Weights   04/20/23 2238 04/25/23 0100  Weight: 83.4 kg 82.3 kg   Examination:  General exam: Appears moderately distressed.  Pt is blind but hearing is good.   Respiratory system: rare expiratory wheezes heard.  Cardiovascular system: irregularly irregular, tachycardic normal S1 & S2 heard. No JVD, murmurs, rubs, gallops or clicks. No pedal edema. Gastrointestinal system: Abdomen is nondistended, soft and nontender. No organomegaly or masses felt. Normal bowel sounds heard. Central nervous system: Alert and oriented. No focal neurological deficits. Extremities: Symmetric 5 x 5 power. Skin: No rashes, lesions or ulcers. Psychiatry: Judgement and insight appear normal. Mood & affect appropriate.   Data Reviewed: I have personally reviewed following labs and imaging studies  CBC: Recent Labs  Lab 04/20/23 1406 04/21/23 0435  WBC 3.6*  3.5*  HGB 10.3* 10.2*  HCT 33.3* 31.8*  MCV 114.8* 115.2*  PLT 159 163    Basic Metabolic Panel: Recent Labs  Lab 04/20/23 1406 04/21/23 0435 04/23/23 0413 04/25/23 0525  NA 134* 135 137 141  K 4.3 4.6 3.2* 3.4*  CL 102 100 99 102  CO2 23 24 27 28   GLUCOSE 134* 147* 171* 72  BUN 16 18 19 14   CREATININE 0.83 0.82 0.77 0.69  CALCIUM 8.5* 8.8* 8.2* 8.6*  MG  --   --  1.8 1.8    CBG: Recent Labs  Lab 04/24/23 1713 04/24/23 2104 04/25/23 0806 04/25/23 0850 04/25/23 1141  GLUCAP 192* 172* 61* 117* 156*    Recent Results (from the past 240 hour(s))  Resp panel by RT-PCR (RSV, Flu A&B, Covid) Anterior Nasal Swab     Status: None   Collection Time: 04/20/23  1:28 PM   Specimen: Anterior Nasal Swab  Result Value Ref Range Status   SARS Coronavirus 2 by RT PCR NEGATIVE NEGATIVE Final  Comment: (NOTE) SARS-CoV-2 target nucleic acids are NOT DETECTED.  The SARS-CoV-2 RNA is generally detectable in upper respiratory specimens during the acute phase of infection. The lowest concentration of SARS-CoV-2 viral copies this assay can detect is 138 copies/mL. A negative result does not preclude SARS-Cov-2 infection and should not be used as the sole basis for treatment or other patient management decisions. A negative result may occur with  improper specimen collection/handling, submission of specimen other than nasopharyngeal swab, presence of viral mutation(s) within the areas targeted by this assay, and inadequate number of viral copies(<138 copies/mL). A negative result must be combined with clinical observations, patient history, and epidemiological information. The expected result is Negative.  Fact Sheet for Patients:  BloggerCourse.com  Fact Sheet for Healthcare Providers:  SeriousBroker.it  This test is no t yet approved or cleared by the [place] FDA and  has been authorized for detection and/or diagnosis of  SARS-CoV-2 by FDA under an Emergency Use Authorization (EUA). This EUA will remain  in effect (meaning this test can be used) for the duration of the COVID-19 declaration under Section 564(b)(1) of the Act, 21 U.S.C.section 360bbb-3(b)(1), unless the authorization is terminated  or revoked sooner.       Influenza A by PCR NEGATIVE NEGATIVE Final   Influenza B by PCR NEGATIVE NEGATIVE Final    Comment: (NOTE) The Xpert Xpress SARS-CoV-2/FLU/RSV plus assay is intended as an aid in the diagnosis of influenza from Nasopharyngeal swab specimens and should not be used as a sole basis for treatment. Nasal washings and aspirates are unacceptable for Xpert Xpress SARS-CoV-2/FLU/RSV testing.  Fact Sheet for Patients: BloggerCourse.com  Fact Sheet for Healthcare Providers: SeriousBroker.it  This test is not yet approved or cleared by the [place] FDA and has been authorized for detection and/or diagnosis of SARS-CoV-2 by FDA under an Emergency Use Authorization (EUA). This EUA will remain in effect (meaning this test can be used) for the duration of the COVID-19 declaration under Section 564(b)(1) of the Act, 21 U.S.C. section 360bbb-3(b)(1), unless the authorization is terminated or revoked.     Resp Syncytial Virus by PCR NEGATIVE NEGATIVE Final    Comment: (NOTE) Fact Sheet for Patients: BloggerCourse.com  Fact Sheet for Healthcare Providers: SeriousBroker.it  This test is not yet approved or cleared by the [place] FDA and has been authorized for detection and/or diagnosis of SARS-CoV-2 by FDA under an Emergency Use Authorization (EUA). This EUA will remain in effect (meaning this test can be used) for the duration of the COVID-19 declaration under Section 564(b)(1) of the Act, 21 U.S.C. section 360bbb-3(b)(1), unless the authorization is terminated  or revoked.  Performed at [facility], [address] [place], [place] [address]   MRSA Next Gen by PCR, Nasal     Status: None   Collection Time: 04/20/23  7:55 PM   Specimen: Nasal Mucosa; Nasal Swab  Result Value Ref Range Status   MRSA by PCR Next Gen NOT DETECTED NOT DETECTED Final    Comment: (NOTE) The GeneXpert MRSA Assay (FDA approved for NASAL specimens only), is one component of a comprehensive MRSA colonization surveillance program. It is not intended to diagnose MRSA infection nor to guide or monitor treatment for MRSA infections. Test performance is not FDA approved in patients less than 40 years old. Performed at [facility], [address] [place], [place] [address]      Radiology Studies: No results found.  Scheduled Meds:  sodium chloride   Intravenous Once  amitriptyline  50 mg Oral QHS   apixaban  5 mg Oral BID   azaTHIOprine  150 mg Oral Daily   Chlorhexidine Gluconate Cloth  6 each Topical Daily   ezetimibe  10 mg Oral Daily   insulin aspart  0-9 Units Subcutaneous TID WC   insulin aspart  2 Units Subcutaneous TID WC   [START ON 04/26/2023] insulin glargine-yfgn  5 Units Subcutaneous QHS   levothyroxine  100 mcg Oral QAC breakfast   lidocaine  1 patch Transdermal Q24H   losartan  25 mg Oral Daily   metoprolol tartrate  75 mg Oral BID   pantoprazole  40 mg Oral QPM   polyethylene glycol  17 g Oral BID   predniSONE  10 mg Oral Daily   sertraline  50 mg Oral Daily   Continuous Infusions:  amiodarone 30 mg/hr (04/25/23 0934)     LOS: 5 days   Critical Care Procedure Note Authorized and Performed by: Maryln Manuel MD  Total Critical Care time:  60 mins Due to a high probability of clinically significant, life threatening deterioration, the patient required my highest level of preparedness to intervene emergently and I personally spent this critical care time directly and personally managing the patient.  This critical care time included obtaining a  history; examining the patient, pulse oximetry; ordering and review of studies; arranging urgent treatment with development of a management plan; evaluation of patient's response of treatment; frequent reassessment; and discussions with other providers.  This critical care time was performed to assess and manage the high probability of imminent and life threatening deterioration that could result in multi-organ failure.  It was exclusive of separately billable procedures and treating other patients and teaching time.    Standley Dakins, MD How to contact the [facility] Attending or Consulting provider 7A - 7P or covering provider during after hours 7P -7A, for this patient?  Check the care team in [facility] and look for a) attending/consulting [facility] provider listed and b) the [facility] team listed Log into [email or web address] to find provider on call.  Locate the [facility] provider you are looking for under [facility] and page to a number that you can be directly reached. If you still have difficulty reaching the provider, please page the [facility] (Director on Call) for the Hospitalists listed on amion for assistance.  04/25/2023, 12:50 PM

## 2023-04-25 NOTE — Plan of Care (Signed)
  Problem: Education: Goal: Knowledge of General Education information will improve Description: Including pain rating scale, medication(s)/side effects and non-pharmacologic comfort measures Outcome: Progressing   Problem: Health Behavior/Discharge Planning: Goal: Ability to manage health-related needs will improve Outcome: Progressing   Problem: Clinical Measurements: Goal: Ability to maintain clinical measurements within normal limits will improve Outcome: Progressing Goal: Will remain free from infection Outcome: Progressing Goal: Diagnostic test results will improve Outcome: Progressing Goal: Respiratory complications will improve Outcome: Progressing Goal: Cardiovascular complication will be avoided Outcome: Progressing   Problem: Activity: Goal: Risk for activity intolerance will decrease Outcome: Progressing   Problem: Nutrition: Goal: Adequate nutrition will be maintained Outcome: Progressing   Problem: Coping: Goal: Level of anxiety will decrease Outcome: Progressing   Problem: Elimination: Goal: Will not experience complications related to bowel motility Outcome: Progressing Goal: Will not experience complications related to urinary retention Outcome: Progressing   Problem: Pain Management: Goal: General experience of comfort will improve Outcome: Progressing   Problem: Safety: Goal: Ability to remain free from injury will improve Outcome: Progressing   Problem: Skin Integrity: Goal: Risk for impaired skin integrity will decrease Outcome: Progressing   Problem: Education: Goal: Knowledge of disease or condition will improve Outcome: Progressing Goal: Understanding of medication regimen will improve Outcome: Progressing Goal: Individualized Educational Video(s) Outcome: Progressing   Problem: Activity: Goal: Ability to tolerate increased activity will improve Outcome: Progressing   Problem: Cardiac: Goal: Ability to achieve and maintain  adequate cardiopulmonary perfusion will improve Outcome: Progressing   Problem: Health Behavior/Discharge Planning: Goal: Ability to safely manage health-related needs after discharge will improve Outcome: Progressing   Problem: Education: Goal: Ability to describe self-care measures that may prevent or decrease complications (Diabetes Survival Skills Education) will improve Outcome: Progressing Goal: Individualized Educational Video(s) Outcome: Progressing   Problem: Coping: Goal: Ability to adjust to condition or change in health will improve Outcome: Progressing   Problem: Fluid Volume: Goal: Ability to maintain a balanced intake and output will improve Outcome: Progressing   Problem: Health Behavior/Discharge Planning: Goal: Ability to identify and utilize available resources and services will improve Outcome: Progressing Goal: Ability to manage health-related needs will improve Outcome: Progressing   Problem: Metabolic: Goal: Ability to maintain appropriate glucose levels will improve Outcome: Progressing   Problem: Nutritional: Goal: Maintenance of adequate nutrition will improve Outcome: Progressing Goal: Progress toward achieving an optimal weight will improve Outcome: Progressing   Problem: Skin Integrity: Goal: Risk for impaired skin integrity will decrease Outcome: Progressing   Problem: Tissue Perfusion: Goal: Adequacy of tissue perfusion will improve Outcome: Progressing

## 2023-04-25 NOTE — Progress Notes (Signed)
Nurse went in to check on pt. And pt. Had taken blood pressure cuff off and O2 sensor probe off. Nurse asked pt. If she would allow nurse to apply BP cuff and O2 sensor back on to monitor her more closely. Pt. Initially refused. Nurse provided education to pt. As to why it is important to monitor her, nurse explained the reasons for everything that staff is trying to do. Pt. Then said well okay. Nurse applied BP cuff and O2 sensor. BP 140/87 HR 82 in afib rhythm, O2 96 on RA. Pt. Refused  temperature check and demanded nurse to leave the room. Will continue to monitor.

## 2023-04-25 NOTE — Plan of Care (Signed)
  Problem: Education: Goal: Knowledge of General Education information will improve Description Including pain rating scale, medication(s)/side effects and non-pharmacologic comfort measures Outcome: Progressing   Problem: Health Behavior/Discharge Planning: Goal: Ability to manage health-related needs will improve Outcome: Progressing   

## 2023-04-25 NOTE — Plan of Care (Signed)
Problem: Education: Goal: Knowledge of General Education information will improve Description: Including pain rating scale, medication(s)/side effects and non-pharmacologic comfort measures 04/25/2023 1756 by Alois Cliche, RN Outcome: Progressing 04/25/2023 1756 by Alois Cliche, RN Outcome: Progressing   Problem: Health Behavior/Discharge Planning: Goal: Ability to manage health-related needs will improve 04/25/2023 1756 by Alois Cliche, RN Outcome: Progressing 04/25/2023 1756 by Alois Cliche, RN Outcome: Progressing   Problem: Clinical Measurements: Goal: Ability to maintain clinical measurements within normal limits will improve 04/25/2023 1756 by Alois Cliche, RN Outcome: Progressing 04/25/2023 1756 by Alois Cliche, RN Outcome: Progressing Goal: Will remain free from infection 04/25/2023 1756 by Alois Cliche, RN Outcome: Progressing 04/25/2023 1756 by Alois Cliche, RN Outcome: Progressing Goal: Diagnostic test results will improve 04/25/2023 1756 by Alois Cliche, RN Outcome: Progressing 04/25/2023 1756 by Alois Cliche, RN Outcome: Progressing Goal: Respiratory complications will improve 04/25/2023 1756 by Alois Cliche, RN Outcome: Progressing 04/25/2023 1756 by Alois Cliche, RN Outcome: Progressing Goal: Cardiovascular complication will be avoided 04/25/2023 1756 by Alois Cliche, RN Outcome: Progressing 04/25/2023 1756 by Alois Cliche, RN Outcome: Progressing   Problem: Activity: Goal: Risk for activity intolerance will decrease 04/25/2023 1756 by Alois Cliche, RN Outcome: Progressing 04/25/2023 1756 by Alois Cliche, RN Outcome: Progressing   Problem: Nutrition: Goal: Adequate nutrition will be maintained 04/25/2023 1756 by Alois Cliche, RN Outcome: Progressing 04/25/2023 1756 by Alois Cliche, RN Outcome: Progressing   Problem: Coping: Goal: Level of anxiety will  decrease 04/25/2023 1756 by Alois Cliche, RN Outcome: Progressing 04/25/2023 1756 by Alois Cliche, RN Outcome: Progressing   Problem: Elimination: Goal: Will not experience complications related to bowel motility 04/25/2023 1756 by Alois Cliche, RN Outcome: Progressing 04/25/2023 1756 by Alois Cliche, RN Outcome: Progressing Goal: Will not experience complications related to urinary retention 04/25/2023 1756 by Alois Cliche, RN Outcome: Progressing 04/25/2023 1756 by Alois Cliche, RN Outcome: Progressing   Problem: Pain Management: Goal: General experience of comfort will improve 04/25/2023 1756 by Alois Cliche, RN Outcome: Progressing 04/25/2023 1756 by Alois Cliche, RN Outcome: Progressing   Problem: Safety: Goal: Ability to remain free from injury will improve 04/25/2023 1756 by Alois Cliche, RN Outcome: Progressing 04/25/2023 1756 by Alois Cliche, RN Outcome: Progressing   Problem: Skin Integrity: Goal: Risk for impaired skin integrity will decrease 04/25/2023 1756 by Alois Cliche, RN Outcome: Progressing 04/25/2023 1756 by Alois Cliche, RN Outcome: Progressing   Problem: Education: Goal: Knowledge of disease or condition will improve 04/25/2023 1756 by Alois Cliche, RN Outcome: Progressing 04/25/2023 1756 by Alois Cliche, RN Outcome: Progressing Goal: Understanding of medication regimen will improve 04/25/2023 1756 by Alois Cliche, RN Outcome: Progressing 04/25/2023 1756 by Alois Cliche, RN Outcome: Progressing Goal: Individualized Educational Video(s) 04/25/2023 1756 by Alois Cliche, RN Outcome: Progressing 04/25/2023 1756 by Alois Cliche, RN Outcome: Progressing   Problem: Activity: Goal: Ability to tolerate increased activity will improve 04/25/2023 1756 by Alois Cliche, RN Outcome: Progressing 04/25/2023 1756 by Alois Cliche, RN Outcome:  Progressing   Problem: Cardiac: Goal: Ability to achieve and maintain adequate cardiopulmonary perfusion will improve 04/25/2023 1756 by Alois Cliche, RN Outcome: Progressing 04/25/2023 1756 by Alois Cliche, RN Outcome: Progressing   Problem: Health Behavior/Discharge Planning: Goal: Ability to safely manage health-related needs after discharge will improve 04/25/2023 1756 by Alois Cliche,  RN Outcome: Progressing 04/25/2023 1756 by Alois Cliche, RN Outcome: Progressing   Problem: Education: Goal: Ability to describe self-care measures that may prevent or decrease complications (Diabetes Survival Skills Education) will improve 04/25/2023 1756 by Alois Cliche, RN Outcome: Progressing 04/25/2023 1756 by Alois Cliche, RN Outcome: Progressing Goal: Individualized Educational Video(s) 04/25/2023 1756 by Alois Cliche, RN Outcome: Progressing 04/25/2023 1756 by Alois Cliche, RN Outcome: Progressing   Problem: Coping: Goal: Ability to adjust to condition or change in health will improve 04/25/2023 1756 by Alois Cliche, RN Outcome: Progressing 04/25/2023 1756 by Alois Cliche, RN Outcome: Progressing   Problem: Fluid Volume: Goal: Ability to maintain a balanced intake and output will improve 04/25/2023 1756 by Alois Cliche, RN Outcome: Progressing 04/25/2023 1756 by Alois Cliche, RN Outcome: Progressing   Problem: Health Behavior/Discharge Planning: Goal: Ability to identify and utilize available resources and services will improve 04/25/2023 1756 by Alois Cliche, RN Outcome: Progressing 04/25/2023 1756 by Alois Cliche, RN Outcome: Progressing Goal: Ability to manage health-related needs will improve 04/25/2023 1756 by Alois Cliche, RN Outcome: Progressing 04/25/2023 1756 by Alois Cliche, RN Outcome: Progressing   Problem: Metabolic: Goal: Ability to maintain appropriate glucose levels will  improve Outcome: Progressing   Problem: Nutritional: Goal: Maintenance of adequate nutrition will improve Outcome: Progressing Goal: Progress toward achieving an optimal weight will improve Outcome: Progressing   Problem: Skin Integrity: Goal: Risk for impaired skin integrity will decrease Outcome: Progressing   Problem: Tissue Perfusion: Goal: Adequacy of tissue perfusion will improve Outcome: Progressing

## 2023-04-26 DIAGNOSIS — I4891 Unspecified atrial fibrillation: Secondary | ICD-10-CM | POA: Diagnosis not present

## 2023-04-26 LAB — BASIC METABOLIC PANEL
Anion gap: 13 (ref 5–15)
BUN: 18 mg/dL (ref 8–23)
CO2: 26 mmol/L (ref 22–32)
Calcium: 8.4 mg/dL — ABNORMAL LOW (ref 8.9–10.3)
Chloride: 99 mmol/L (ref 98–111)
Creatinine, Ser: 0.73 mg/dL (ref 0.44–1.00)
GFR, Estimated: >60 mL/min (ref >60)
Glucose, Bld: 136 mg/dL — ABNORMAL HIGH (ref 70–99)
Potassium: 4 mmol/L (ref 3.5–5.1)
Sodium: 138 mmol/L (ref 135–145)

## 2023-04-26 LAB — MAGNESIUM: Magnesium: 2.1 mg/dL (ref 1.7–2.4)

## 2023-04-26 LAB — GLUCOSE, CAPILLARY
Glucose-Capillary: 113 mg/dL — ABNORMAL HIGH (ref 70–99)
Glucose-Capillary: 224 mg/dL — ABNORMAL HIGH (ref 70–99)
Glucose-Capillary: 258 mg/dL — ABNORMAL HIGH (ref 70–99)
Glucose-Capillary: 249 mg/dL — ABNORMAL HIGH (ref 70–99)

## 2023-04-26 MED ORDER — AMIODARONE LOAD VIA INFUSION
150.0000 mg | Freq: Once | INTRAVENOUS | Status: AC
  Administered 2023-04-26: 150 mg via INTRAVENOUS
  Filled 2023-04-26: qty 83.34

## 2023-04-26 MED ORDER — LOSARTAN POTASSIUM 25 MG PO TABS: 25.0000 mg | ORAL_TABLET | Freq: Every day | ORAL | Status: DC

## 2023-04-26 MED ORDER — LOSARTAN POTASSIUM 50 MG PO TABS: 50.0000 mg | ORAL_TABLET | Freq: Every day | ORAL | Status: DC

## 2023-04-26 MED ORDER — METOPROLOL TARTRATE 50 MG PO TABS: 100.0000 mg | ORAL_TABLET | Freq: Two times a day (BID) | ORAL | Status: DC

## 2023-04-26 NOTE — Plan of Care (Signed)
  Problem: Education: Goal: Knowledge of General Education information will improve Description: Including pain rating scale, medication(s)/side effects and non-pharmacologic comfort measures Outcome: Not Progressing   Problem: Health Behavior/Discharge Planning: Goal: Ability to manage health-related needs will improve Outcome: Progressing   Problem: Clinical Measurements: Goal: Ability to maintain clinical measurements within normal limits will improve Outcome: Progressing Goal: Will remain free from infection Outcome: Progressing Goal: Diagnostic test results will improve Outcome: Progressing Goal: Respiratory complications will improve Outcome: Progressing Goal: Cardiovascular complication will be avoided Outcome: Progressing   Problem: Activity: Goal: Risk for activity intolerance will decrease Outcome: Not Progressing   Problem: Nutrition: Goal: Adequate nutrition will be maintained Outcome: Progressing   Problem: Coping: Goal: Level of anxiety will decrease Outcome: Progressing   Problem: Elimination: Goal: Will not experience complications related to bowel motility Outcome: Not Progressing Goal: Will not experience complications related to urinary retention Outcome: Progressing   Problem: Pain Management: Goal: General experience of comfort will improve Outcome: Progressing   Problem: Safety: Goal: Ability to remain free from injury will improve Outcome: Progressing   Problem: Skin Integrity: Goal: Risk for impaired skin integrity will decrease Outcome: Progressing   Problem: Education: Goal: Knowledge of disease or condition will improve Outcome: Not Progressing Goal: Understanding of medication regimen will improve Outcome: Not Progressing Goal: Individualized Educational Video(s) Outcome: Not Progressing   Problem: Activity: Goal: Ability to tolerate increased activity will improve Outcome: Not Progressing   Problem: Cardiac: Goal: Ability to  achieve and maintain adequate cardiopulmonary perfusion will improve Outcome: Progressing   Problem: Health Behavior/Discharge Planning: Goal: Ability to safely manage health-related needs after discharge will improve Outcome: Progressing   Problem: Education: Goal: Ability to describe self-care measures that may prevent or decrease complications (Diabetes Survival Skills Education) will improve Outcome: Not Progressing Goal: Individualized Educational Video(s) Outcome: Not Progressing   Problem: Coping: Goal: Ability to adjust to condition or change in health will improve Outcome: Progressing   Problem: Fluid Volume: Goal: Ability to maintain a balanced intake and output will improve Outcome: Progressing   Problem: Health Behavior/Discharge Planning: Goal: Ability to identify and utilize available resources and services will improve Outcome: Progressing Goal: Ability to manage health-related needs will improve Outcome: Progressing   Problem: Metabolic: Goal: Ability to maintain appropriate glucose levels will improve Outcome: Progressing

## 2023-04-26 NOTE — Progress Notes (Signed)
Rounding Note    Patient Name: [name] Date of Encounter: 04/26/2023  [facility] Cardiologist: Dina Rich, MD   Subjective   No complaints  Inpatient Medications    Scheduled Meds:  sodium chloride   Intravenous Once   amitriptyline  50 mg Oral QHS   apixaban  5 mg Oral BID   azaTHIOprine  150 mg Oral Daily   Chlorhexidine Gluconate Cloth  6 each Topical Daily   ezetimibe  10 mg Oral Daily   insulin aspart  0-9 Units Subcutaneous TID WC   insulin aspart  2 Units Subcutaneous TID WC   insulin glargine-yfgn  5 Units Subcutaneous QHS   levothyroxine  100 mcg Oral QAC breakfast   lidocaine  1 patch Transdermal Q24H   losartan  25 mg Oral Daily   metoprolol tartrate  75 mg Oral BID   pantoprazole  40 mg Oral QPM   polyethylene glycol  17 g Oral BID   predniSONE  10 mg Oral Daily   sertraline  50 mg Oral Daily   Continuous Infusions:  amiodarone 30 mg/hr (04/26/23 0710)   PRN Meds: acetaminophen **OR** acetaminophen, albuterol, ALPRAZolam, menthol-cetylpyridinium, prochlorperazine   Vital Signs    Vitals:   04/26/23 0400 04/26/23 0500 04/26/23 0806 04/26/23 0827  BP:  (!) 164/90  122/80  Pulse: 95 (!) 102  (!) 106  Resp: 15 (!) 21    Temp:   97.8 F (36.6 C)   TempSrc:   Oral   SpO2: (!) 88% 96%    Weight:      Height:        Intake/Output Summary (Last 24 hours) at 04/26/2023 0842 Last data filed at 04/26/2023 0710 Gross per 24 hour  Intake 1056.77 ml  Output --  Net 1056.77 ml      04/25/2023    1:00 AM 04/20/2023   10:38 PM 02/24/2023    8:54 PM  Last 3 Weights  Weight (lbs) 181 lb 7 oz 183 lb 13.8 oz 182 lb 12.2 oz  Weight (kg) 82.3 kg 83.4 kg 82.9 kg      Telemetry    Afib variable rates - Personally Reviewed  ECG    N/a - Personally Reviewed  Physical Exam   GEN: No acute distress.   Neck: No JVD Cardiac: irreg Respiratory: Clear to auscultation bilaterally. GI: Soft, nontender, non-distended  MS: No  edema; No deformity. Neuro:  Nonfocal  Psych: Normal affect   Labs    High Sensitivity Troponin:   Recent Labs  Lab 04/20/23 1406 04/20/23 1629  TROPONINIHS 11 9     Chemistry Recent Labs  Lab 04/20/23 1406 04/21/23 0435 04/23/23 0413 04/25/23 0525 04/26/23 0345  NA 134*   < > 137 141 138  K 4.3   < > 3.2* 3.4* 4.0  CL 102   < > 99 102 99  CO2 23   < > 27 28 26   GLUCOSE 134*   < > 171* 72 136*  BUN 16   < > 19 14 18   CREATININE 0.83   < > 0.77 0.69 0.73  CALCIUM 8.5*   < > 8.2* 8.6* 8.4*  MG  --   --  1.8 1.8 2.1  PROT 6.7  --   --   --   --   ALBUMIN 3.2*  --   --   --   --   AST 64*  --   --   --   --  ALT 31  --   --   --   --   ALKPHOS 80  --   --   --   --   BILITOT 0.7  --   --   --   --   GFRNONAA >60   < > >60 >60 >60  ANIONGAP 9   < > 11 11 13    < > = values in this interval not displayed.    Lipids No results for input(s): "CHOL", "TRIG", "HDL", "LABVLDL", "LDLCALC", "CHOLHDL" in the last 168 hours.  Hematology Recent Labs  Lab 04/20/23 1406 04/21/23 0435  WBC 3.6* 3.5*  RBC 2.90* 2.76*  HGB 10.3* 10.2*  HCT 33.3* 31.8*  MCV 114.8* 115.2*  MCH 35.5* 37.0*  MCHC 30.9 32.1  RDW 17.5* 17.6*  PLT 159 163   Thyroid  Recent Labs  Lab 04/21/23 0435  TSH 3.568    BNP Recent Labs  Lab 04/21/23 0435  BNP 539.0*    DDimer No results for input(s): "DDIMER" in the last 168 hours.   Radiology    No results found.  Cardiac Studies    Patient Profile     77 y.o. female ith a hx of CAD (s/p BMS to LCx in 2003, DESx3 to LCx, OM3 and RCA in 2006), HTN, HLD, Type 2 DM and paroxysmal atrial fibrillation (diagnosed in 08/2022 during admission for PNA and not placed on Amiodarone due to abnormal TSH) who is currently admitted for atrial fibrillation with RVR.   Assessment & Plan    1.PAF - issues with afib with RVR this admission - initially on IV Cardizem and Lopressor 50 mg twice daily. Issues with rate control, started on IV amio - prior  abnormal TSH that has resolved, monitor while on amio - ideally would chemically convert on amio. Elderly patient with waxing waning mental status would be reluctant for DCCV.  - she is on eliquis 5mg  bid for stroke prevention  - rebolus amio 150, continue drip at 30mg /hr. Increase lopressor to 100mg  bid.      2.Acute HFmrEF - 08/2022 echo: LVEF 50-55% - 04/2023 echo limited: LVEF 45-50% - CXR mild edema, BNP 539   - slight decline in LVEF and HF exacerbation likely rate related - has been diuresed, appears euvolemic.  - can transition lopressor to toprol once rate controlled. Added losartan 25mg  daily. WIth low normal to mildly reduced LVEF likely tachy mediated I would not neccesarily commit to aggressive extensive  long term HFrEF medical regimen. Control rhythm and repeat echo over next few weeks, if persistent or further decline could be more aggressive at that point.      3. CAD - She is s/p BMS to LCx in 2003 and DESx3 to LCx, OM3 and RCA in 2006. Troponin values have been negative this admission. History is limited given her AMS but no reported chest pain at this time.  - Not on ASA given the need for anticoagulation and previously had elevated LFT's with statin therapy.   For questions or updates, please contact [facility] Please consult [email or web address] for contact info under        Signed, Dina Rich, MD  04/26/2023, 8:42 AM

## 2023-04-26 NOTE — Progress Notes (Signed)
PROGRESS NOTE   [name]  ZOX:[id number] DOB: 05-12-1946 DOA: 04/20/2023 PCP: Galvin Proffer, MD   Chief Complaint  Patient presents with   Shortness of Breath    Staff at ALF stated patient was having some shortness of breath and that her O2 was at 80% RA, EMS was called and they found patient to be in A-fib between 70-130 with an O2 of 90%.  Complaints of pain in her right shoulder denied chest pain.   Level of care: Stepdown  Brief Admission History:  77 y.o. female,   with a history of COPD, room air at baseline, hypertension, hyperlipidemia, hypothyroidism, CAD s/p stent placement, GERD, diabetes mellitus type 2, blindness, obesity.  She is wheelchair dependent for last year.  Sisters at bedside.  Pt is ALF resident, she was brought by the facility for dyspnea, hypoxia, at baseline patient with no supplemental oxygen requirement, she was noted by facility to be dyspneic with hypoxia spO2 80% on room air, EMS were called she was found to be in A-fib with heart rate in the 130s, requiring oxygen 2 L which brings her saturation up to 90%, denies any chest pain, fever, chills, cough, she does report vomiting couple times today, she denies any abdominal pain, diarrhea. -In ED she was noted to be in A-fib with RVR, multiple attempts with IV metoprolol did not slow her heart rate, so she was started on Cardizem drip, chest x-ray significant for mild pulmonary edema, labs were significant for low normal potassium at 4.3, creatinine within normal limit at 0.8, white blood cell count 3.6, platelet at 159K her respiratory panel including COVID, and flu were negative,[facility] hospitalist consulted to admi   Assessment and Plan:  Acute respiratory failure with hypoxia Pulmonary edema Acute HFrEF  -Patient with hypoxia 80% on room air on presentation, with increased work of breathing, imaging significant for pulmonary edema likely due to A-fib with RVR -weaned to room air oxygen -Echo 11/21: LVEF  40-45% with RWMAs -she was treated with 2 doses of IV furosemide   Added daily weights and obtain high quality ReDS vest readings  A-fib with RVR -Known history of paroxysmal A-fib, presented in RVR, did not improve despite multiple dosing of IV metoprolol, initially started on Cardizem drip  -weaned off Cardizem drip on 11/22 -started on IV amiodarone on 11/22 and transitioned to oral amiodarone 200 mg BID 11/23 per recs -continue apixaban for anticoagulation -appreciate cardiology team consultation and recommendations -started back on IV amiodarone 11/25 due to recurrent Afib RVR -cardiology team ordered to rebolus IV amiodarone 11/26 and continue infusion and increased metoprolol to 100 mg BID    Nausea with Vomiting - RESOLVED  -Presented with vomiting.  She was afebrile, no leukocytosis, abdominal exam is benign, will keep on as needed nausea meds and follow clinically -currently tolerating diet with no further emesis  Generalized Abdominal Pain - improved  constipation - KUB showing a lot of gas  - added simethicone 11/23 - continue miralax to BID  - small BM 11/24, abd pain better   Acute delirium - likely ICU delirium - sisters reported she has been having more issues of confusion/delirum  - sisters would like outpatient follow up with neurologist for dementia evaluation   COPD with mild exacerbation  -continue bronchodilators -completed 3 day course of antibiotics  Acute respiratory failure with hypoxia  - she has been weaned down to room air oxygen  - treated for mild COPD exacerbation  UTI - Treated -  follow urine culture - ceftriaxone IV x 3 days completed   Hypertension  -suboptimally controlled -increased metoprolol to 75 mg BID on 11/24 -cardiology increased to 100 mg BID on 11/26 -continue losartan 25 mg daily    Hypothyroidism -continue with home dose Synthroid -TSH has improved nicely to 3.568   Autoimmune hepatitis -Continue with Imuran, she is  chronically on prednisone 10 mg daily    GERD -Continue with PPI   Hypoglycemia from insulin - reduced basal and SSI coverage 11/25 - continue close monitoring of CBG  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  Remains on IV amiodarone infusion.  Pt has intermittent ICU delirium.  Pt reports no abdominal pain.      Objective: Vitals:   04/26/23 0700 04/26/23 0800 04/26/23 0806 04/26/23 0827  BP: (!) 185/97 122/80  122/80  Pulse: (!) 108 (!) 108  (!) 106  Resp: 20 20    Temp:   97.8 F (36.6 C)   TempSrc:   Oral   SpO2: 100% 97%    Weight:  83.4 kg    Height:  5\' 1"  (1.549 m)      Intake/Output Summary (Last 24 hours) at 04/26/2023 0936 Last data filed at 04/26/2023 0851 Gross per 24 hour  Intake 1056.77 ml  Output --  Net 1056.77 ml   Filed Weights   04/20/23 2238 04/25/23 0100 04/26/23 0800  Weight: 83.4 kg 82.3 kg 83.4 kg   Examination:  General exam: Appears moderately distressed.  Pt is blind but hearing is good.   Respiratory system: no increased work of breathing.   Cardiovascular system: irregularly irregular, tachycardic normal S1 & S2 heard. No JVD, murmurs, rubs, gallops or clicks. No pedal edema. Gastrointestinal system: Abdomen is nondistended, soft and nontender. No organomegaly or masses felt. Normal bowel sounds heard. Central nervous  system: Alert and oriented. No focal neurological deficits. Extremities: Symmetric 5 x 5 power. Skin: No rashes, lesions or ulcers. Psychiatry: Judgement and insight appear normal. Mood & affect appropriate.   Data Reviewed: I have personally reviewed following labs and imaging studies  CBC: Recent Labs  Lab 04/20/23 1406 04/21/23 0435  WBC 3.6* 3.5*  HGB 10.3* 10.2*  HCT 33.3* 31.8*  MCV 114.8* 115.2*  PLT 159 163    Basic Metabolic Panel: Recent Labs  Lab 04/20/23 1406 04/21/23 0435 04/23/23 0413 04/25/23 0525 04/26/23 0345  NA 134* 135 137 141 138  K 4.3 4.6 3.2* 3.4* 4.0  CL 102 100 99 102 99  CO2 23 24 27 28 26   GLUCOSE 134* 147* 171* 72 136*  BUN 16 18 19 14 18   CREATININE 0.83 0.82 0.77 0.69 0.73  CALCIUM 8.5* 8.8* 8.2* 8.6* 8.4*  MG  --   --  1.8 1.8 2.1    CBG: Recent Labs  Lab 04/25/23 0806 04/25/23 0850 04/25/23 1141 04/25/23 1655 04/26/23  0805  GLUCAP 61* 117* 156* 152* 113*    Recent Results (from the past 240 hour(s))  Resp panel by RT-PCR (RSV, Flu A&B, Covid) Anterior Nasal Swab     Status: None   Collection Time: 04/20/23  1:28 PM   Specimen: Anterior Nasal Swab  Result Value Ref Range Status   SARS Coronavirus 2 by RT PCR NEGATIVE NEGATIVE Final    Comment: (NOTE) SARS-CoV-2 target nucleic acids are NOT DETECTED.  The SARS-CoV-2 RNA is generally detectable in upper respiratory specimens during the acute phase of infection. The lowest concentration of SARS-CoV-2 viral copies this assay can detect is 138 copies/mL. A negative result does not preclude SARS-Cov-2 infection and should not be used as the sole basis for treatment or other patient management decisions. A negative result may occur with  improper specimen collection/handling, submission of specimen other than nasopharyngeal swab, presence of viral mutation(s) within the areas targeted by this assay, and inadequate number of viral copies(<138 copies/mL). A negative result must  be combined with clinical observations, patient history, and epidemiological information. The expected result is Negative.  Fact Sheet for Patients:  BloggerCourse.com  Fact Sheet for Healthcare Providers:  SeriousBroker.it  This test is no t yet approved or cleared by the [place] FDA and  has been authorized for detection and/or diagnosis of SARS-CoV-2 by FDA under an Emergency Use Authorization (EUA). This EUA will remain  in effect (meaning this test can be used) for the duration of the COVID-19 declaration under Section 564(b)(1) of the Act, 21 U.S.C.section 360bbb-3(b)(1), unless the authorization is terminated  or revoked sooner.       Influenza A by PCR NEGATIVE NEGATIVE Final   Influenza B by PCR NEGATIVE NEGATIVE Final    Comment: (NOTE) The Xpert Xpress SARS-CoV-2/FLU/RSV plus assay is intended as an aid in the diagnosis of influenza from Nasopharyngeal swab specimens and should not be used as a sole basis for treatment. Nasal washings and aspirates are unacceptable for Xpert Xpress SARS-CoV-2/FLU/RSV testing.  Fact Sheet for Patients: BloggerCourse.com  Fact Sheet for Healthcare Providers: SeriousBroker.it  This test is not yet approved or cleared by the [place] FDA and has been authorized for detection and/or diagnosis of SARS-CoV-2 by FDA under an Emergency Use Authorization (EUA). This EUA will remain in effect (meaning this test can be used) for the duration of the COVID-19 declaration under Section 564(b)(1) of the Act, 21 U.S.C. section 360bbb-3(b)(1), unless the authorization is terminated or revoked.     Resp Syncytial Virus by PCR NEGATIVE NEGATIVE Final    Comment: (NOTE) Fact Sheet for Patients: BloggerCourse.com  Fact Sheet for Healthcare Providers: SeriousBroker.it  This test is not  yet approved or cleared by the [place] FDA and has been authorized for detection and/or diagnosis of SARS-CoV-2 by FDA under an Emergency Use Authorization (EUA). This EUA will remain in effect (meaning this test can be used) for the duration of the COVID-19 declaration under Section 564(b)(1) of the Act, 21 U.S.C. section 360bbb-3(b)(1), unless the authorization is terminated or revoked.  Performed at [facility], [address] [place], [place] [address]   MRSA Next Gen by PCR, Nasal     Status: None   Collection Time: 04/20/23  7:55 PM   Specimen: Nasal Mucosa; Nasal Swab  Result Value Ref Range Status   MRSA by PCR Next Gen NOT DETECTED NOT DETECTED Final    Comment: (NOTE) The GeneXpert MRSA Assay (FDA approved for NASAL specimens only), is one  component of a comprehensive MRSA colonization surveillance program. It is not intended to diagnose MRSA infection nor to guide or monitor treatment for MRSA infections. Test performance is not FDA approved in patients less than 83 years old. Performed at [facility], [address], [place], [place] [address]      Radiology Studies: No results found.  Scheduled Meds:  sodium chloride   Intravenous Once   amitriptyline  50 mg Oral QHS   apixaban  5 mg Oral BID   azaTHIOprine  150 mg Oral Daily   Chlorhexidine Gluconate Cloth  6 each Topical Daily   ezetimibe  10 mg Oral Daily   insulin aspart  0-9 Units Subcutaneous TID WC   insulin aspart  2 Units Subcutaneous TID WC   insulin glargine-yfgn  5 Units Subcutaneous QHS   levothyroxine  100 mcg Oral QAC breakfast   lidocaine  1 patch Transdermal Q24H   [START ON 04/27/2023] losartan  25 mg Oral Daily   metoprolol tartrate  100 mg Oral BID   pantoprazole  40 mg Oral QPM   polyethylene glycol  17 g Oral BID   predniSONE  10 mg Oral Daily   sertraline  50 mg Oral Daily   Continuous Infusions:  amiodarone 30 mg/hr (04/26/23 0710)     LOS: 6 days   Critical Care  Procedure Note Authorized and Performed by: Maryln Manuel MD  Total Critical Care time:  53 mins Due to a high probability of clinically significant, life threatening deterioration, the patient required my highest level of preparedness to intervene emergently and I personally spent this critical care time directly and personally managing the patient.  This critical care time included obtaining a history; examining the patient, pulse oximetry; ordering and review of studies; arranging urgent treatment with development of a management plan; evaluation of patient's response of treatment; frequent reassessment; and discussions with other providers.  This critical care time was performed to assess and manage the high probability of imminent and life threatening deterioration that could result in multi-organ failure.  It was exclusive of separately billable procedures and treating other patients and teaching time.    Standley Dakins, MD How to contact the [facility] Attending or Consulting provider 7A - 7P or covering provider during after hours 7P -7A, for this patient?  Check the care team in [facility] and look for a) attending/consulting [facility] provider listed and b) the [facility] team listed Log into [email or web address] to find provider on call.  Locate the [facility] provider you are looking for under [facility] and page to a number that you can be directly reached. If you still have difficulty reaching the provider, please page the [facility] (Director on Call) for the Hospitalists listed on amion for assistance.  04/26/2023, 9:36 AM

## 2023-04-27 DIAGNOSIS — J441 Chronic obstructive pulmonary disease with (acute) exacerbation: Secondary | ICD-10-CM | POA: Diagnosis not present

## 2023-04-27 DIAGNOSIS — I4891 Unspecified atrial fibrillation: Secondary | ICD-10-CM | POA: Diagnosis not present

## 2023-04-27 DIAGNOSIS — I5031 Acute diastolic (congestive) heart failure: Secondary | ICD-10-CM | POA: Diagnosis not present

## 2023-04-27 LAB — CBC
HCT: 32.6 % — ABNORMAL LOW (ref 36.0–46.0)
Hemoglobin: 10 g/dL — ABNORMAL LOW (ref 12.0–15.0)
MCH: 35.3 pg — ABNORMAL HIGH (ref 26.0–34.0)
MCHC: 30.7 g/dL (ref 30.0–36.0)
MCV: 115.2 fL — ABNORMAL HIGH (ref 80.0–100.0)
Platelets: 164 10*3/uL (ref 150–400)
RBC: 2.83 MIL/uL — ABNORMAL LOW (ref 3.87–5.11)
RDW: 17.2 % — ABNORMAL HIGH (ref 11.5–15.5)
WBC: 3 10*3/uL — ABNORMAL LOW (ref 4.0–10.5)
nRBC: 0 % (ref 0.0–0.2)

## 2023-04-27 LAB — GLUCOSE, CAPILLARY
Glucose-Capillary: 144 mg/dL — ABNORMAL HIGH (ref 70–99)
Glucose-Capillary: 254 mg/dL — ABNORMAL HIGH (ref 70–99)
Glucose-Capillary: 240 mg/dL — ABNORMAL HIGH (ref 70–99)
Glucose-Capillary: 251 mg/dL — ABNORMAL HIGH (ref 70–99)

## 2023-04-27 LAB — BRAIN NATRIURETIC PEPTIDE: B Natriuretic Peptide: 1008 pg/mL — ABNORMAL HIGH (ref 0.0–100.0)

## 2023-04-27 MED ORDER — LOSARTAN POTASSIUM 50 MG PO TABS
50.0000 mg | ORAL_TABLET | Freq: Every day | ORAL | Status: DC
  Administered 2023-04-27 – 2023-04-29 (×3): 50 mg via ORAL
  Filled 2023-04-27 (×3): qty 1

## 2023-04-27 MED ORDER — AMIODARONE HCL 200 MG PO TABS
200.0000 mg | ORAL_TABLET | Freq: Every day | ORAL | Status: DC
  Administered 2023-04-27 – 2023-04-29 (×3): 200 mg via ORAL
  Filled 2023-04-27 (×3): qty 1

## 2023-04-27 MED ORDER — FUROSEMIDE 10 MG/ML IJ SOLN
40.0000 mg | Freq: Once | INTRAMUSCULAR | Status: AC
  Administered 2023-04-27: 40 mg via INTRAVENOUS
  Filled 2023-04-27: qty 4
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
## 2023-04-27 NOTE — Care Management Important Message (Signed)
Important Message  Patient Details  Name: [name] MRN: [id number] Date of Birth: [birth year]   Important Message Given:  Yes - Medicare IM     Corey Harold 04/27/2023, 12:16 PM

## 2023-04-27 NOTE — Plan of Care (Signed)
  Problem: Education: Goal: Knowledge of General Education information will improve Description: Including pain rating scale, medication(s)/side effects and non-pharmacologic comfort measures Outcome: Progressing   Problem: Health Behavior/Discharge Planning: Goal: Ability to manage health-related needs will improve Outcome: Progressing   Problem: Clinical Measurements: Goal: Ability to maintain clinical measurements within normal limits will improve Outcome: Progressing Goal: Will remain free from infection Outcome: Progressing Goal: Diagnostic test results will improve Outcome: Progressing Goal: Respiratory complications will improve Outcome: Progressing Goal: Cardiovascular complication will be avoided Outcome: Progressing   Problem: Activity: Goal: Risk for activity intolerance will decrease Outcome: Progressing   Problem: Nutrition: Goal: Adequate nutrition will be maintained Outcome: Progressing   Problem: Coping: Goal: Level of anxiety will decrease Outcome: Progressing   Problem: Elimination: Goal: Will not experience complications related to bowel motility Outcome: Progressing Goal: Will not experience complications related to urinary retention Outcome: Progressing   Problem: Pain Management: Goal: General experience of comfort will improve Outcome: Progressing   Problem: Safety: Goal: Ability to remain free from injury will improve Outcome: Progressing   Problem: Skin Integrity: Goal: Risk for impaired skin integrity will decrease Outcome: Progressing   Problem: Education: Goal: Knowledge of disease or condition will improve Outcome: Progressing Goal: Understanding of medication regimen will improve Outcome: Progressing Goal: Individualized Educational Video(s) Outcome: Progressing   Problem: Activity: Goal: Ability to tolerate increased activity will improve Outcome: Progressing   Problem: Cardiac: Goal: Ability to achieve and maintain  adequate cardiopulmonary perfusion will improve Outcome: Progressing   Problem: Health Behavior/Discharge Planning: Goal: Ability to safely manage health-related needs after discharge will improve Outcome: Progressing   Problem: Education: Goal: Ability to describe self-care measures that may prevent or decrease complications (Diabetes Survival Skills Education) will improve Outcome: Progressing Goal: Individualized Educational Video(s) Outcome: Progressing   Problem: Coping: Goal: Ability to adjust to condition or change in health will improve Outcome: Progressing   Problem: Fluid Volume: Goal: Ability to maintain a balanced intake and output will improve Outcome: Progressing   Problem: Health Behavior/Discharge Planning: Goal: Ability to identify and utilize available resources and services will improve Outcome: Progressing Goal: Ability to manage health-related needs will improve Outcome: Progressing   Problem: Metabolic: Goal: Ability to maintain appropriate glucose levels will improve Outcome: Progressing   Problem: Nutritional: Goal: Maintenance of adequate nutrition will improve Outcome: Progressing Goal: Progress toward achieving an optimal weight will improve Outcome: Progressing   Problem: Skin Integrity: Goal: Risk for impaired skin integrity will decrease Outcome: Progressing   Problem: Tissue Perfusion: Goal: Adequacy of tissue perfusion will improve Outcome: Progressing

## 2023-04-27 NOTE — Progress Notes (Signed)
This nurse and tech assisted pt. To [facility]. Patient tolerated transfer well. Pt. Was unsteady on her feet but able to slowly shuffle to [facility]. Pt. Did urinate but was unable to have a BM.

## 2023-04-27 NOTE — Progress Notes (Signed)
PT Cancellation Note  Patient Details Name: [name] MRN: [id number] DOB: 1945-09-19   Cancelled Treatment:    Reason Eval/Treat Not Completed: Patient declined, no reason specified.  Patient declined therapy secondary to c/o fatigue even after much encouragement.   3:30 PM, 04/27/23 Ocie Bob, MPT Physical Therapist with [facility] 336 [phone] office [phone] mobile phone

## 2023-04-27 NOTE — TOC Progression Note (Signed)
Transition of Care [facility]) - Progression Note    Patient Details  Name: [name] MRN: [id number] Date of Birth: 1945-06-18  Transition of Care [facility] CM/SW Contact  Erin Sons, [place] Phone Number: 04/27/2023, 3:13 PM  Clinical Narrative:     CSW spoke with Olegario Messier at [facility]. She confirmed that they could take pt back tomorrow if DC summary and meds are sent early. They would not be able to provide transportation on a holiday.   Expected Discharge Plan: Assisted Living Barriers to Discharge: Continued Medical Work up  Expected Discharge Plan and Services     Post Acute Care Choice: Home Health Living arrangements for the past 2 months: Assisted Living Facility                           HH Arranged: PT [facility] Agency: [facility] Date HH Agency Contacted: 04/21/23 Time HH Agency Contacted: 1417 Representative spoke with at [facility] Agency: [facility] Sheldon   Social Determinants of Health (SDOH) Interventions SDOH Screenings   Food Insecurity: No Food Insecurity (04/20/2023)  Housing: Low Risk  (04/20/2023)  Transportation Needs: No Transportation Needs (04/20/2023)  Utilities: Not At Risk (04/20/2023)  Tobacco Use: Medium Risk (04/20/2023)  Health Literacy: High Risk (09/08/2020)   Received from [facility]    Readmission Risk Interventions    04/21/2023    2:15 PM 04/21/2023    2:13 PM 02/25/2023   11:04 AM  Readmission Risk Prevention Plan  Transportation Screening Complete Complete Complete  PCP or Specialist Appt within 3-5 Days  Not Complete Not Complete  HRI or Home Care Consult Complete Complete Complete  Social Work Consult for Recovery Care Planning/Counseling Complete Complete Complete  Palliative Care Screening Not Applicable Not Applicable Not Applicable  Medication Review Oceanographer) Complete Complete Complete

## 2023-04-27 NOTE — Progress Notes (Signed)
Rounding Note    Patient Name: [name] Date of Encounter: 04/27/2023  [facility] Cardiologist: Dina Rich, MD   Subjective   No complaints  Inpatient Medications    Scheduled Meds:  sodium chloride   Intravenous Once   amitriptyline  50 mg Oral QHS   apixaban  5 mg Oral BID   azaTHIOprine  150 mg Oral Daily   Chlorhexidine Gluconate Cloth  6 each Topical Daily   ezetimibe  10 mg Oral Daily   insulin aspart  0-9 Units Subcutaneous TID WC   insulin aspart  2 Units Subcutaneous TID WC   insulin glargine-yfgn  5 Units Subcutaneous QHS   levothyroxine  100 mcg Oral QAC breakfast   lidocaine  1 patch Transdermal Q24H   losartan  50 mg Oral Daily   pantoprazole  40 mg Oral QPM   polyethylene glycol  17 g Oral BID   predniSONE  10 mg Oral Daily   sertraline  50 mg Oral Daily   Continuous Infusions:  PRN Meds: acetaminophen **OR** acetaminophen, albuterol, ALPRAZolam, menthol-cetylpyridinium, prochlorperazine   Vital Signs    Vitals:   04/27/23 0500 04/27/23 0612 04/27/23 0752 04/27/23 0800  BP: (!) 169/44 (!) 170/57    Pulse: (!) 50 (!) 52    Resp: (!) 22 18    Temp: 97.8 F (36.6 C)  (!) 97.5 F (36.4 C)   TempSrc: Oral  Axillary   SpO2: 97% 97%    Weight: 82.1 kg   82.1 kg  Height:    5\' 1"  (1.549 m)    Intake/Output Summary (Last 24 hours) at 04/27/2023 0808 Last data filed at 04/27/2023 0500 Gross per 24 hour  Intake 449.99 ml  Output 450 ml  Net -0.01 ml      04/27/2023    8:00 AM 04/27/2023    5:00 AM 04/26/2023    8:00 AM  Last 3 Weights  Weight (lbs) 181 lb 181 lb 183 lb 13.8 oz  Weight (kg) 82.1 kg 82.1 kg 83.4 kg      Telemetry    Sinus brady 50s - Personally Reviewed  ECG    N/a - Personally Reviewed  Physical Exam   GEN: No acute distress.   Neck: No JVD Cardiac: sinus brady Respiratory: crackles bilaterally GI: Soft, nontender, non-distended  MS: No edema; No deformity. Neuro:  Nonfocal  Psych:  Normal affect   Labs    High Sensitivity Troponin:   Recent Labs  Lab 04/20/23 1406 04/20/23 1629  TROPONINIHS 11 9     Chemistry Recent Labs  Lab 04/20/23 1406 04/21/23 0435 04/23/23 0413 04/25/23 0525 04/26/23 0345  NA 134*   < > 137 141 138  K 4.3   < > 3.2* 3.4* 4.0  CL 102   < > 99 102 99  CO2 23   < > 27 28 26   GLUCOSE 134*   < > 171* 72 136*  BUN 16   < > 19 14 18   CREATININE 0.83   < > 0.77 0.69 0.73  CALCIUM 8.5*   < > 8.2* 8.6* 8.4*  MG  --   --  1.8 1.8 2.1  PROT 6.7  --   --   --   --   ALBUMIN 3.2*  --   --   --   --   AST 64*  --   --   --   --   ALT 31  --   --   --   --  ALKPHOS 80  --   --   --   --   BILITOT 0.7  --   --   --   --   GFRNONAA >60   < > >60 >60 >60  ANIONGAP 9   < > 11 11 13    < > = values in this interval not displayed.    Lipids No results for input(s): "CHOL", "TRIG", "HDL", "LABVLDL", "LDLCALC", "CHOLHDL" in the last 168 hours.  Hematology Recent Labs  Lab 04/20/23 1406 04/21/23 0435 04/27/23 0457  WBC 3.6* 3.5* 3.0*  RBC 2.90* 2.76* 2.83*  HGB 10.3* 10.2* 10.0*  HCT 33.3* 31.8* 32.6*  MCV 114.8* 115.2* 115.2*  MCH 35.5* 37.0* 35.3*  MCHC 30.9 32.1 30.7  RDW 17.5* 17.6* 17.2*  PLT 159 163 164   Thyroid  Recent Labs  Lab 04/21/23 0435  TSH 3.568    BNP Recent Labs  Lab 04/21/23 0435 04/27/23 0457  BNP 539.0* 1,008.0*    DDimer No results for input(s): "DDIMER" in the last 168 hours.   Radiology    No results found.  Cardiac Studies    Patient Profile     77 y.o. female ith a hx of CAD (s/p BMS to LCx in 2003, DESx3 to LCx, OM3 and RCA in 2006), HTN, HLD, Type 2 DM and paroxysmal atrial fibrillation (diagnosed in 08/2022 during admission for PNA and not placed on Amiodarone due to abnormal TSH) who is currently admitted for atrial fibrillation with RVR.   Assessment & Plan    1.PAF - issues with afib with RVR this admission - initially on IV Cardizem and Lopressor 50 mg twice daily. Limited rate  control, started on IV amio - prior abnormal TSH that has resolved, monitor while on amio - she is on eliquis 5mg  bid for stroke prevention   - converted to junctional rhythm yesterday in the 40s, stable blood pressure. Amio drip was stopped, oral lopressor 100mg  bid was stopped. This AM sinus brady 50s.  - will start oral amio 200mg  daily. Remain off lopressor with bradycardia      2.Acute HFmrEF - 08/2022 echo: LVEF 50-55% - 04/2023 echo limited: LVEF 45-50% - CXR mild edema, BNP 539   - slight decline in LVEF and HF exacerbation likely rate related - diursed initially, now some recurrent signs of HF with crackles on exam and BNP up to 1000 - redose IV lasix 40mg  x 1 today.   - no beta blocker now given bradycardia. Started on losartan 25 mg daily, with HTN increase dose to 50mg .  - WIth low normal to mildly reduced LVEF likely tachy mediated I would not neccesarily commit to aggressive extensive  long term HFrEF medical regimen. Control rhythm and repeat echo over next few weeks, if persistent or further decline could be more aggressive at that point.      3. CAD - She is s/p BMS to LCx in 2003 and DESx3 to LCx, OM3 and RCA in 2006. Troponin values have been negative this admission. History is limited given her AMS but no reported chest pain at this time.  - Not on ASA given the need for anticoagulation and previously had elevated LFT's with statin therapy.   Diurese today, pending volume status and heart rates possibly home tomorrow. Can transfer out of unit to tele bed.   For questions or updates, please contact [facility] Please consult [email or web address] for contact info under        Signed, Dina Rich, MD  04/27/2023, 8:08 AM

## 2023-04-28 DIAGNOSIS — I5031 Acute diastolic (congestive) heart failure: Secondary | ICD-10-CM | POA: Diagnosis not present

## 2023-04-28 DIAGNOSIS — I4891 Unspecified atrial fibrillation: Secondary | ICD-10-CM | POA: Diagnosis not present

## 2023-04-28 DIAGNOSIS — I480 Paroxysmal atrial fibrillation: Secondary | ICD-10-CM | POA: Diagnosis not present

## 2023-04-28 LAB — BASIC METABOLIC PANEL
Anion gap: 11 (ref 5–15)
BUN: 13 mg/dL (ref 8–23)
CO2: 27 mmol/L (ref 22–32)
Calcium: 8.6 mg/dL — ABNORMAL LOW (ref 8.9–10.3)
Chloride: 99 mmol/L (ref 98–111)
Creatinine, Ser: 0.65 mg/dL (ref 0.44–1.00)
GFR, Estimated: >60 mL/min (ref >60)
Glucose, Bld: 173 mg/dL — ABNORMAL HIGH (ref 70–99)
Potassium: 3.3 mmol/L — ABNORMAL LOW (ref 3.5–5.1)
Sodium: 137 mmol/L (ref 135–145)

## 2023-04-28 LAB — GLUCOSE, CAPILLARY
Glucose-Capillary: 160 mg/dL — ABNORMAL HIGH (ref 70–99)
Glucose-Capillary: 235 mg/dL — ABNORMAL HIGH (ref 70–99)
Glucose-Capillary: 319 mg/dL — ABNORMAL HIGH (ref 70–99)
Glucose-Capillary: 266 mg/dL — ABNORMAL HIGH (ref 70–99)

## 2023-04-28 LAB — MAGNESIUM: Magnesium: 1.7 mg/dL (ref 1.7–2.4)

## 2023-04-28 MED ORDER — HYDRALAZINE HCL 20 MG/ML IJ SOLN
10.0000 mg | Freq: Four times a day (QID) | INTRAMUSCULAR | Status: DC | PRN
  Administered 2023-04-28 – 2023-04-29 (×4): 10 mg via INTRAVENOUS
  Filled 2023-04-28 (×4): qty 1

## 2023-04-28 MED ORDER — LOSARTAN POTASSIUM 50 MG PO TABS: 50.0000 mg | ORAL_TABLET | Freq: Every day | ORAL | Status: DC

## 2023-04-28 MED ORDER — AMIODARONE HCL 200 MG PO TABS: 200.0000 mg | ORAL_TABLET | Freq: Every day | ORAL | Status: DC

## 2023-04-28 MED ORDER — FUROSEMIDE 20 MG PO TABS
20.0000 mg | ORAL_TABLET | Freq: Once | ORAL | Status: AC
  Administered 2023-04-28: 20 mg via ORAL
  Filled 2023-04-28: qty 1

## 2023-04-28 MED ORDER — POTASSIUM CHLORIDE CRYS ER 20 MEQ PO TBCR
40.0000 meq | EXTENDED_RELEASE_TABLET | Freq: Once | ORAL | Status: AC
  Administered 2023-04-28: 40 meq via ORAL
  Filled 2023-04-28: qty 2

## 2023-04-28 MED ORDER — MAGNESIUM OXIDE -MG SUPPLEMENT 400 (240 MG) MG PO TABS: 400.0000 mg | ORAL_TABLET | Freq: Every day | ORAL | Status: DC

## 2023-04-28 MED ORDER — MAGNESIUM OXIDE -MG SUPPLEMENT 400 (240 MG) MG PO TABS
400.0000 mg | ORAL_TABLET | Freq: Every day | ORAL | Status: DC
  Administered 2023-04-28 – 2023-04-29 (×2): 400 mg via ORAL
  Filled 2023-04-28 (×2): qty 1

## 2023-04-28 MED ORDER — HYDRALAZINE HCL 50 MG PO TABS: 50.0000 mg | ORAL_TABLET | Freq: Three times a day (TID) | ORAL | Status: DC

## 2023-04-28 MED ORDER — HYDRALAZINE HCL 25 MG PO TABS
25.0000 mg | ORAL_TABLET | Freq: Three times a day (TID) | ORAL | Status: DC
  Administered 2023-04-28 – 2023-04-29 (×2): 25 mg via ORAL
  Filled 2023-04-28 (×2): qty 1

## 2023-04-28 MED ORDER — HYDRALAZINE HCL 25 MG PO TABS: 25.0000 mg | ORAL_TABLET | Freq: Three times a day (TID) | ORAL | Status: DC

## 2023-04-28 NOTE — TOC Progression Note (Signed)
Transition of Care [facility]) - Progression Note    Patient Details  Name: [name] MRN: [id number] Date of Birth: 1945/12/23  Transition [facility]) CM/SW Contact  Leitha Bleak, RN Phone Number: 04/28/2023, 2:42 PM  Clinical Narrative:   Patient up for discharge. FL2 completed, CM has left message for Olegario Messier at [facility] and attempted to call the facility multiple times. No one answering and mail box is full. CM faxed DC summary and FL2. Per RN, family was not ready for her to discharge today and they can not provide transportation. CM explain to RN we have to wait to hear from someone at [facility] that she can return. TOC following.     Expected Discharge Plan: Assisted Living Barriers to Discharge: Other (must enter comment) (Can not reach ALF)  Expected Discharge Plan and Services     Post Acute Care Choice: Home Health Living arrangements for the past 2 months: Assisted Living Facility Expected Discharge Date: 04/28/23                         HH Arranged: PT HH Agency: [facility] Date HH Agency Contacted: 04/21/23 Time HH Agency Contacted: 1417 Representative spoke with at [place] Agency: Morrie Sheldon   Social Determinants of Health (SDOH) Interventions SDOH Screenings   Food Insecurity: No Food Insecurity (04/20/2023)  Housing: Low Risk  (04/20/2023)  Transportation Needs: No Transportation Needs (04/20/2023)  Utilities: Not At Risk (04/20/2023)  Tobacco Use: Medium Risk (04/20/2023)  Health Literacy: High Risk (09/08/2020)   Received from [facility], [facility]    Readmission Risk Interventions    04/21/2023    2:15 PM 04/21/2023    2:13 PM 02/25/2023   11:04 AM  Readmission Risk Prevention Plan  Transportation Screening Complete Complete Complete  PCP or Specialist Appt within 3-5 Days  Not Complete Not Complete  HRI or Home Care Consult Complete Complete Complete  Social Work Consult for Recovery Care  Planning/Counseling Complete Complete Complete  Palliative Care Screening Not Applicable Not Applicable Not Applicable  Medication Review Oceanographer) Complete Complete Complete
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
## 2023-04-28 NOTE — [place] FL2 (Signed)
[place] MEDICAID FL2 LEVEL OF CARE FORM     IDENTIFICATION  Patient Name: [name] Birthdate: 01-11-1946 Sex: female Admission Date (Current Location): 04/20/2023  [facility] and [place] Number:  [employer] and Address:  [facility],  [address], [place] [address]      Provider Number: [id number]  Attending Physician Name and Address:  Catarina Hartshorn, MD  Relative Name and Phone Number:       Current Level of Care: Hospital Recommended Level of Care: Assisted Living Facility Prior Approval Number:    Date Approved/Denied:   PASRR Number:    Discharge Plan: Other (Comment) (ALF)    Current Diagnoses: Patient Active Problem List   Diagnosis Date Noted   Hypoglycemia 04/25/2023   Atrial fibrillation with RVR (HCC) 04/20/2023   AMS (altered mental status) 02/24/2023   Paroxysmal atrial fibrillation with RVR (HCC) 09/23/2022   Poisoning by unspecified narcotics, intentional self-harm, initial encounter (HCC) 09/22/2022   Pneumonia 09/22/2022   Loose stools 09/14/2022   Bimalleolar fracture of right ankle 06/29/2021   Hypokalemia 06/29/2021   Hyperglycemia due to diabetes mellitus (HCC) 06/29/2021   Insomnia 06/29/2021   Diabetic neuropathy (HCC) 06/29/2021   COVID-19 virus infection 06/29/2021   Autoimmune hepatitis (HCC) 01/20/2021   Elevated LFTs 12/11/2020   NASH (nonalcoholic steatohepatitis) 12/11/2020   Acute lower UTI 06/18/2018   Elevated troponin 06/18/2018   UTI (urinary tract infection) 06/18/2018   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) 06/21/2017   Mastalgia 06/21/2017   Acute diastolic CHF (congestive heart failure) (HCC) 12/20/2016   Adrenal insufficiency (HCC) 12/18/2016   Lobar pneumonia (HCC) 12/15/2016   Acute metabolic encephalopathy 12/15/2016   Thrombocytopenia (HCC) 12/15/2016   CKD (chronic kidney disease) stage 3, GFR 30-59 ml/min (HCC) 12/15/2016   Sepsis (HCC) 12/14/2016   COPD exacerbation  (HCC) 04/24/2016   DM type 2 (diabetes mellitus, type 2) (HCC) 04/24/2016   Aortic atherosclerosis (HCC) 04/24/2016   Transaminitis 10/20/2014   Acute bronchitis 10/20/2014   Acute respiratory failure (HCC) 10/17/2014   IBS (irritable bowel syndrome) 08/07/2012   Lower abdominal pain 10/05/2011   Coronary artery disease    Tobacco abuse, in remission    Gastroesophageal reflux disease    Obesity, Class II, BMI 35-39.9 04/01/2010   BLINDNESS 04/01/2010   Hypothyroidism 09/29/2009   Hyperlipidemia, unspecified 09/29/2009   Essential hypertension 09/29/2009    Orientation RESPIRATION BLADDER Height & Weight     Self, Time, Situation, Place  Normal Incontinent Weight: 80.1 kg Height:  5\' 1"  (154.9 cm)  BEHAVIORAL SYMPTOMS/MOOD NEUROLOGICAL BOWEL NUTRITION STATUS      Incontinent Diet (Regular diet /No concentrated sweets))  AMBULATORY STATUS COMMUNICATION OF NEEDS Skin   Limited Assist Verbally Other (Comment) (redness, some folds)                       Personal Care Assistance Level of Assistance  Bathing, Feeding, Dressing Bathing Assistance: Limited assistance Feeding assistance: Independent Dressing Assistance: Limited assistance     Functional Limitations Info  Sight, Hearing, Speech Sight Info: Impaired Hearing Info: Adequate Speech Info: Adequate    SPECIAL CARE FACTORS FREQUENCY  PT (By licensed PT)     PT Frequency: Home Health              Contractures Contractures Info: Not present    Additional Factors Info  Code Status, Allergies, Psychotropic, Insulin Sliding Scale, Isolation Precautions Code Status Info: Full Allergies Info: codeine  Psychotropic Info: Zoloft   Isolation Precautions Info: MRSA contact precautious 09/23/22     Current Medications (04/28/2023):  This is the current hospital active medication list Current Facility-Administered Medications  Medication Dose Route Frequency Provider Last Rate Last Admin   0.9 %  sodium  chloride infusion (Manually program via Guardrails IV Fluids)   Intravenous Once Elgergawy, Leana Roe, MD   Held at 04/20/23 2108   acetaminophen (TYLENOL) tablet 650 mg  650 mg Oral Q6H PRN Elgergawy, Leana Roe, MD   650 mg at 04/27/23 2134   Or   acetaminophen (TYLENOL) suppository 650 mg  650 mg Rectal Q6H PRN Elgergawy, Leana Roe, MD       albuterol (PROVENTIL) (2.5 MG/3ML) 0.083% nebulizer solution 2.5 mg  2.5 mg Nebulization Q2H PRN Elgergawy, Leana Roe, MD       ALPRAZolam Prudy Feeler) tablet 0.25 mg  0.25 mg Oral TID PRN Laural Benes, Clanford L, MD   0.25 mg at 04/27/23 2135   amiodarone (PACERONE) tablet 200 mg  200 mg Oral Daily Antoine Poche, MD   200 mg at 04/28/23 0843   amitriptyline (ELAVIL) tablet 50 mg  50 mg Oral QHS Elgergawy, Leana Roe, MD   50 mg at 04/27/23 2135   apixaban (ELIQUIS) tablet 5 mg  5 mg Oral BID Elgergawy, Leana Roe, MD   5 mg at 04/28/23 0840   azaTHIOprine (IMURAN) tablet 150 mg  150 mg Oral Daily Elgergawy, Leana Roe, MD   150 mg at 04/28/23 0840   Chlorhexidine Gluconate Cloth 2 % PADS 6 each  6 each Topical Daily Elgergawy, Leana Roe, MD   6 each at 04/28/23 0844   ezetimibe (ZETIA) tablet 10 mg  10 mg Oral Daily Elgergawy, Leana Roe, MD   10 mg at 04/28/23 0843   hydrALAZINE (APRESOLINE) injection 10 mg  10 mg Intravenous Q6H PRN Adefeso, Oladapo, DO   10 mg at 04/28/23 0846   hydrALAZINE (APRESOLINE) tablet 25 mg  25 mg Oral Q8H Tat, Onalee Hua, MD       insulin aspart (novoLOG) injection 0-9 Units  0-9 Units Subcutaneous TID WC Johnson, Clanford L, MD   3 Units at 04/28/23 1220   insulin aspart (novoLOG) injection 2 Units  2 Units Subcutaneous TID WC Johnson, Clanford L, MD   2 Units at 04/28/23 1221   insulin glargine-yfgn (SEMGLEE) injection 5 Units  5 Units Subcutaneous QHS Johnson, Clanford L, MD   5 Units at 04/27/23 2144   levothyroxine (SYNTHROID) tablet 100 mcg  100 mcg Oral QAC breakfast Elgergawy, Leana Roe, MD   100 mcg at 04/28/23 0612   lidocaine (LIDODERM) 5 %  1 patch  1 patch Transdermal Q24H Elgergawy, Leana Roe, MD   1 patch at 04/27/23 1647   losartan (COZAAR) tablet 50 mg  50 mg Oral Daily Antoine Poche, MD   50 mg at 04/28/23 0842   magnesium oxide (MAG-OX) tablet 400 mg  400 mg Oral Daily Tat, David, MD   400 mg at 04/28/23 1222   menthol-cetylpyridinium (CEPACOL) lozenge 3 mg  1 lozenge Oral PRN Opyd, Lavone Neri, MD   3 mg at 04/22/23 2124   pantoprazole (PROTONIX) EC tablet 40 mg  40 mg Oral QPM Johnson, Clanford L, MD   40 mg at 04/27/23 1834   polyethylene glycol (MIRALAX / GLYCOLAX) packet 17 g  17 g Oral BID Johnson, Clanford L, MD   17 g at 04/28/23 0844   potassium chloride SA (KLOR-CON M) CR  tablet 40 mEq  40 mEq Oral Once Tat, David, MD       predniSONE (DELTASONE) tablet 10 mg  10 mg Oral Daily Johnson, Clanford L, MD   10 mg at 04/28/23 0842   prochlorperazine (COMPAZINE) injection 10 mg  10 mg Intravenous Q4H PRN Johnson, Clanford L, MD   10 mg at 04/25/23 1306   sertraline (ZOLOFT) tablet 50 mg  50 mg Oral Daily Elgergawy, Leana Roe, MD   50 mg at 04/28/23 0840     Discharge Medications:  Allergies as of 04/28/2023       Reactions   Codeine Anaphylaxis, Other (See Comments)   REACTION: caused "cramping in hands" and hyperventilation.        Medication List     STOP taking these medications    Delsym 30 MG/5ML liquid Generic drug: dextromethorphan   guaifenesin 100 MG/5ML syrup Commonly known as: ROBITUSSIN   ipratropium-albuterol 0.5-2.5 (3) MG/3ML Soln Commonly known as: DUONEB   metoprolol tartrate 50 MG tablet Commonly known as: LOPRESSOR       TAKE these medications    albuterol 108 (90 Base) MCG/ACT inhaler Commonly known as: VENTOLIN HFA Inhale 2 puffs into the lungs every 6 (six) hours as needed for wheezing or shortness of breath.   amiodarone 200 MG tablet Commonly known as: PACERONE Take 1 tablet (200 mg total) by mouth daily. Start taking on: April 29, 2023   amitriptyline 50 MG  tablet Commonly known as: ELAVIL Take 50 mg by mouth at bedtime.   apixaban 5 MG Tabs tablet Commonly known as: ELIQUIS Take 1 tablet (5 mg total) by mouth 2 (two) times daily.   ascorbic acid 500 MG tablet Commonly known as: VITAMIN C Take 500 mg by mouth daily.   azaTHIOprine 50 MG tablet Commonly known as: IMURAN Take 3 tablets (150 mg total) by mouth daily.   calcium carbonate 500 MG chewable tablet Commonly known as: TUMS - dosed in mg elemental calcium Chew 1,000 mg by mouth every 8 (eight) hours as needed for heartburn.   Cholecalciferol 50 MCG (2000 UT) Tabs Take 2,000 Units by mouth daily at 6 (six) AM.   ezetimibe 10 MG tablet Commonly known as: ZETIA Take 1 tablet (10 mg total) by mouth daily.   glipiZIDE 5 MG tablet Commonly known as: GLUCOTROL Take 5 mg by mouth 2 (two) times daily.   Hemorrhoidal 0.25-14-71.9 % Oint Generic drug: Phenylephrine-Mineral Oil-Pet Place 1 application rectally every 6 (six) hours as needed (hemorrhoid discomfort).   hydrALAZINE 25 MG tablet Commonly known as: APRESOLINE Take 1 tablet (25 mg total) by mouth every 8 (eight) hours.   Imodium A-D 2 MG tablet Generic drug: loperamide Take 4 mg by mouth as needed for diarrhea or loose stools.   insulin lispro 100 UNIT/ML KwikPen Commonly known as: HUMALOG Inject 2-15 Units into the skin 3 (three) times daily before meals. CBG < 121 = 0 units, 121-150 = 2 units, 151-200 = 3 units, 201-250 = 5 units, 251-300 = 8 units, 301-400 = 15 units.   Lantus SoloStar 100 UNIT/ML Solostar Pen Generic drug: insulin glargine Inject 14 Units into the skin 2 (two) times daily. Prime pen with 2u prior to each use   levothyroxine 100 MCG tablet Commonly known as: SYNTHROID Take 1 tablet (100 mcg total) by mouth daily before breakfast.   lidocaine 5 % Commonly known as: LIDODERM Place 1 patch onto the skin daily. On for 12 hours, off for 12 hours.  loratadine 10 MG tablet Commonly known as:  CLARITIN Take 10 mg by mouth daily.   losartan 50 MG tablet Commonly known as: COZAAR Take 1 tablet (50 mg total) by mouth daily. Start taking on: April 29, 2023   magnesium oxide 400 (240 Mg) MG tablet Commonly known as: MAG-OX Take 1 tablet (400 mg total) by mouth daily.   magnesium oxide 400 MG tablet Commonly known as: MAG-OX Take 400 mg by mouth daily.   melatonin 5 MG Tabs Take 5 mg by mouth at bedtime.   MILK OF MAGNESIA PO Take 30 mLs by mouth 2 (two) times daily as needed (constipation).   nystatin powder Generic drug: nystatin Apply 1 Application topically 3 (three) times daily as needed (redness under abdominal folds and breasts).   polyethylene glycol 17 g packet Commonly known as: MIRALAX / GLYCOLAX Take 17 g by mouth daily.   potassium chloride 10 MEQ CR capsule Commonly known as: MICRO-K Take 20 mEq by mouth 2 (two) times daily.   predniSONE 10 MG tablet Commonly known as: DELTASONE Take 10 mg by mouth daily.   sertraline 50 MG tablet Commonly known as: ZOLOFT Take 50 mg by mouth daily.   zinc sulfate (50mg  elemental zinc) 220 (50 Zn) MG capsule Take 220 mg by mouth daily.       Relevant Imaging Results:  Relevant Lab Results:   Additional Information SS# [id number]  Leitha Bleak, RN

## 2023-04-29 DIAGNOSIS — I480 Paroxysmal atrial fibrillation: Secondary | ICD-10-CM | POA: Diagnosis not present

## 2023-04-29 DIAGNOSIS — J441 Chronic obstructive pulmonary disease with (acute) exacerbation: Secondary | ICD-10-CM | POA: Diagnosis not present

## 2023-04-29 DIAGNOSIS — I5031 Acute diastolic (congestive) heart failure: Secondary | ICD-10-CM | POA: Diagnosis not present

## 2023-04-29 LAB — BASIC METABOLIC PANEL
Anion gap: 12 (ref 5–15)
BUN: 13 mg/dL (ref 8–23)
CO2: 26 mmol/L (ref 22–32)
Calcium: 8.7 mg/dL — ABNORMAL LOW (ref 8.9–10.3)
Chloride: 99 mmol/L (ref 98–111)
Creatinine, Ser: 0.77 mg/dL (ref 0.44–1.00)
GFR, Estimated: >60 mL/min (ref >60)
Glucose, Bld: 136 mg/dL — ABNORMAL HIGH (ref 70–99)
Potassium: 3.7 mmol/L (ref 3.5–5.1)
Sodium: 137 mmol/L (ref 135–145)

## 2023-04-29 LAB — GLUCOSE, CAPILLARY
Glucose-Capillary: 145 mg/dL — ABNORMAL HIGH (ref 70–99)
Glucose-Capillary: 240 mg/dL — ABNORMAL HIGH (ref 70–99)

## 2023-04-29 LAB — MAGNESIUM: Magnesium: 1.7 mg/dL (ref 1.7–2.4)

## 2023-04-29 MED ORDER — HYDRALAZINE HCL 50 MG PO TABS
50.0000 mg | ORAL_TABLET | Freq: Three times a day (TID) | ORAL | Status: DC
  Administered 2023-04-29: 50 mg via ORAL
  Filled 2023-04-29: qty 1

## 2023-04-29 MED ORDER — MAGNESIUM SULFATE 2 GM/50ML IV SOLN
2.0000 g | Freq: Once | INTRAVENOUS | Status: AC
  Administered 2023-04-29: 2 g via INTRAVENOUS
  Filled 2023-04-29: qty 50

## 2023-04-29 MED ORDER — HYDRALAZINE HCL 50 MG PO TABS: 50.0000 mg | ORAL_TABLET | Freq: Three times a day (TID) | ORAL | Status: DC

## 2023-04-29 NOTE — Progress Notes (Signed)
Patient placed in her own clothing and IV/purewick removed. Transported via wheelchair to main entrance for [employer] transportation. Attempt X2 to call report to [facility] with no answer. DC packet and instructions sent with patient.

## 2023-04-29 NOTE — Progress Notes (Signed)
OT Cancellation Note  Patient Details Name: [name] MRN: [id number] DOB: 08/19/1945   Cancelled Treatment:    Reason Eval/Treat Not Completed: OT screened, no needs identified, will sign off;Other (comment). Pt screened for OT needs, pt is resident at [facility] [place] and plans to discharge back to ALF today. Pt is near baseline for ADL completion, is assisted by ALF staff at baseline. Pt using wheelchair for mobility since ankle fx several months ago. No further acute OT needs at this time.    Ezra Sites, OTR/L  [phone] 04/29/2023, 10:46 AM
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
## 2023-04-29 NOTE — TOC Transition Note (Signed)
Transition of Care [facility]) - CM/SW Discharge Note   Patient Details  Name: [name] MRN: [id number] Date of Birth: 09/07/1945  [facility] CM/SW Contact:  Karn Cassis, LCSW Phone Number: 04/29/2023, 1:39 PM   Clinical Narrative: Pt d/c today. LCSW spoke with pt's sister, Raynelle Fanning and Olegario Messier at [employer]. Both aware and agreeable. Per Olegario Messier, they are unable to transport. RN feels pt can transport by wheelchair Zenaida Niece. Mona agreeable and provided verbal consent for rider waiver. RN cosigned and is on chart. LCSW arranged transport with Sol Blazing with [facility] notified of d/c today. Home health order in. Updated D/C summary and FL2 sent to [facility]. Kathy aware.       Final next level of care: Assisted Living Barriers to Discharge: Barriers Resolved   Patient Goals and CMS Choice      Discharge Placement                  Patient to be transferred to facility by: [facility] Name of family member notified: Raynelle Fanning- sister Patient and family notified of of transfer: 04/29/23  Discharge Plan and Services Additional resources added to the After Visit Summary for       Post Acute Care Choice: Home Health                    HH Arranged: PT [facility] Agency: [facility] Date [facility] Agency Contacted: 04/21/23 Time HH Agency Contacted: 1417 Representative spoke with at [facility] Agency: Morrie Sheldon  Social Determinants of Health (SDOH) Interventions SDOH Screenings   Food Insecurity: No Food Insecurity (04/20/2023)  Housing: Low Risk  (04/20/2023)  Transportation Needs: No Transportation Needs (04/20/2023)  Utilities: Not At Risk (04/20/2023)  Tobacco Use: Medium Risk (04/20/2023)  Health Literacy: High Risk (09/08/2020)   Received from [facility], [facility]     Readmission Risk Interventions    04/21/2023    2:15 PM 04/21/2023    2:13 PM 02/25/2023   11:04 AM  Readmission Risk Prevention Plan  Transportation  Screening Complete Complete Complete  PCP or Specialist Appt within 3-5 Days  Not Complete Not Complete  HRI or Home Care Consult Complete Complete Complete  Social Work Consult for Recovery Care Planning/Counseling Complete Complete Complete  Palliative Care Screening Not Applicable Not Applicable Not Applicable  Medication Review Oceanographer) Complete Complete Complete

## 2023-04-29 NOTE — [place] FL2 (Signed)
[place] MEDICAID [id number] LEVEL OF CARE FORM     IDENTIFICATION  Patient Name: [name] Birthdate: [birth year] Sex: female Admission Date (Current Location): 04/20/2023  [facility] and [place] Number:  [employer] and Address:  [facility],  [address], [place] [address]      Provider Number: [id number]  Attending Physician Name and Address:  Catarina Hartshorn, MD  Relative Name and Phone Number:       Current Level of Care: Hospital Recommended Level of Care: Assisted Living Facility Prior Approval Number:    Date Approved/Denied:   PASRR Number:    Discharge Plan: Other (Comment) (ALF)    Current Diagnoses: Patient Active Problem List   Diagnosis Date Noted   Hypoglycemia 04/25/2023   Atrial fibrillation with RVR (HCC) 04/20/2023   AMS (altered mental status) 02/24/2023   Paroxysmal atrial fibrillation with RVR (HCC) 09/23/2022   Poisoning by unspecified narcotics, intentional self-harm, initial encounter (HCC) 09/22/2022   Pneumonia 09/22/2022   Loose stools 09/14/2022   Bimalleolar fracture of right ankle 06/29/2021   Hypokalemia 06/29/2021   Hyperglycemia due to diabetes mellitus (HCC) 06/29/2021   Insomnia 06/29/2021   Diabetic neuropathy (HCC) 06/29/2021   COVID-19 virus infection 06/29/2021   Autoimmune hepatitis (HCC) 01/20/2021   Elevated LFTs 12/11/2020   NASH (nonalcoholic steatohepatitis) 12/11/2020   Acute lower UTI 06/18/2018   Elevated troponin 06/18/2018   UTI (urinary tract infection) 06/18/2018   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) 06/21/2017   Mastalgia 06/21/2017   Acute diastolic CHF (congestive heart failure) (HCC) 12/20/2016   Adrenal insufficiency (HCC) 12/18/2016   Lobar pneumonia (HCC) 12/15/2016   Acute metabolic encephalopathy 12/15/2016   Thrombocytopenia (HCC) 12/15/2016   CKD (chronic kidney disease) stage 3, GFR 30-59 ml/min (HCC) 12/15/2016   Sepsis (HCC) 12/14/2016   COPD exacerbation  (HCC) 04/24/2016   DM type 2 (diabetes mellitus, type 2) (HCC) 04/24/2016   Aortic atherosclerosis (HCC) 04/24/2016   Transaminitis 10/20/2014   Acute bronchitis 10/20/2014   Acute respiratory failure (HCC) 10/17/2014   IBS (irritable bowel syndrome) 08/07/2012   Lower abdominal pain 10/05/2011   Coronary artery disease    Tobacco abuse, in remission    Gastroesophageal reflux disease    Obesity, Class II, BMI 35-39.9 04/01/2010   BLINDNESS 04/01/2010   Hypothyroidism 09/29/2009   Hyperlipidemia, unspecified 09/29/2009   Essential hypertension 09/29/2009    Orientation RESPIRATION BLADDER Height & Weight     Self, Place  Normal Incontinent Weight: 176 lb 9.4 oz (80.1 kg) Height:  5\' 1"  (154.9 cm)  BEHAVIORAL SYMPTOMS/MOOD NEUROLOGICAL BOWEL NUTRITION STATUS      Incontinent Diet (Heart healthy)  AMBULATORY STATUS COMMUNICATION OF NEEDS Skin   Limited Assist Verbally Skin abrasions, Bruising                       Personal Care Assistance Level of Assistance  Bathing, Feeding, Dressing Bathing Assistance: Limited assistance Feeding assistance: Limited assistance Dressing Assistance: Limited assistance     Functional Limitations Info  Sight, Hearing, Speech Sight Info: Impaired Hearing Info: Adequate Speech Info: Adequate    SPECIAL CARE FACTORS FREQUENCY  PT (By licensed PT)     PT Frequency: Home health              Contractures Contractures Info: Not present    Additional Factors Info  Code Status, Allergies, Psychotropic, Insulin Sliding Scale, Isolation Precautions Code Status Info: Full Allergies Info: codeine Psychotropic Info:  Zoloft   Isolation Precautions Info: MRSA contact precautious 09/23/22     Current Medications (04/29/2023):  This is the current hospital active medication list Current Facility-Administered Medications  Medication Dose Route Frequency Provider Last Rate Last Admin   0.9 %  sodium chloride infusion (Manually program  via Guardrails IV Fluids)   Intravenous Once Elgergawy, Leana Roe, MD   Held at 04/20/23 2108   acetaminophen (TYLENOL) tablet 650 mg  650 mg Oral Q6H PRN Elgergawy, Leana Roe, MD   650 mg at 04/28/23 2104   Or   acetaminophen (TYLENOL) suppository 650 mg  650 mg Rectal Q6H PRN Elgergawy, Leana Roe, MD       albuterol (PROVENTIL) (2.5 MG/3ML) 0.083% nebulizer solution 2.5 mg  2.5 mg Nebulization Q2H PRN Elgergawy, Leana Roe, MD       ALPRAZolam Prudy Feeler) tablet 0.25 mg  0.25 mg Oral TID PRN Laural Benes, Clanford L, MD   0.25 mg at 04/28/23 2028   amiodarone (PACERONE) tablet 200 mg  200 mg Oral Daily Antoine Poche, MD   200 mg at 04/29/23 0803   amitriptyline (ELAVIL) tablet 50 mg  50 mg Oral QHS Elgergawy, Leana Roe, MD   50 mg at 04/28/23 2100   apixaban (ELIQUIS) tablet 5 mg  5 mg Oral BID Elgergawy, Leana Roe, MD   5 mg at 04/29/23 0803   azaTHIOprine (IMURAN) tablet 150 mg  150 mg Oral Daily Elgergawy, Leana Roe, MD   150 mg at 04/29/23 0805   Chlorhexidine Gluconate Cloth 2 % PADS 6 each  6 each Topical Daily Elgergawy, Leana Roe, MD   6 each at 04/29/23 0806   ezetimibe (ZETIA) tablet 10 mg  10 mg Oral Daily Elgergawy, Leana Roe, MD   10 mg at 04/29/23 0804   hydrALAZINE (APRESOLINE) injection 10 mg  10 mg Intravenous Q6H PRN Adefeso, Oladapo, DO   10 mg at 04/29/23 0403   hydrALAZINE (APRESOLINE) tablet 50 mg  50 mg Oral Q8H Tat, Onalee Hua, MD       insulin aspart (novoLOG) injection 0-9 Units  0-9 Units Subcutaneous TID WC Johnson, Clanford L, MD   1 Units at 04/29/23 0806   insulin aspart (novoLOG) injection 2 Units  2 Units Subcutaneous TID WC Johnson, Clanford L, MD   2 Units at 04/29/23 0806   insulin glargine-yfgn (SEMGLEE) injection 5 Units  5 Units Subcutaneous QHS Johnson, Clanford L, MD   5 Units at 04/28/23 2100   levothyroxine (SYNTHROID) tablet 100 mcg  100 mcg Oral QAC breakfast Elgergawy, Leana Roe, MD   100 mcg at 04/29/23 0626   lidocaine (LIDODERM) 5 % 1 patch  1 patch Transdermal Q24H  Elgergawy, Leana Roe, MD   1 patch at 04/28/23 1708   losartan (COZAAR) tablet 50 mg  50 mg Oral Daily Antoine Poche, MD   50 mg at 04/29/23 0804   magnesium oxide (MAG-OX) tablet 400 mg  400 mg Oral Daily Tat, David, MD   400 mg at 04/29/23 4098   magnesium sulfate IVPB 2 g 50 mL  2 g Intravenous Once Tat, David, MD       menthol-cetylpyridinium (CEPACOL) lozenge 3 mg  1 lozenge Oral PRN Opyd, Lavone Neri, MD   3 mg at 04/22/23 2124   pantoprazole (PROTONIX) EC tablet 40 mg  40 mg Oral QPM Johnson, Clanford L, MD   40 mg at 04/28/23 1709   polyethylene glycol (MIRALAX / GLYCOLAX) packet 17 g  17 g Oral BID  Johnson, Clanford L, MD   17 g at 04/29/23 0804   predniSONE (DELTASONE) tablet 10 mg  10 mg Oral Daily Johnson, Clanford L, MD   10 mg at 04/29/23 2952   prochlorperazine (COMPAZINE) injection 10 mg  10 mg Intravenous Q4H PRN Johnson, Clanford L, MD   10 mg at 04/25/23 1306   sertraline (ZOLOFT) tablet 50 mg  50 mg Oral Daily Elgergawy, Leana Roe, MD   50 mg at 04/29/23 0804     Discharge Medications: TAKE these medications     albuterol 108 (90 Base) MCG/ACT inhaler Commonly known as: VENTOLIN HFA Inhale 2 puffs into the lungs every 6 (six) hours as needed for wheezing or shortness of breath.    amiodarone 200 MG tablet Commonly known as: PACERONE Take 1 tablet (200 mg total) by mouth daily.    amitriptyline 50 MG tablet Commonly known as: ELAVIL Take 50 mg by mouth at bedtime.    apixaban 5 MG Tabs tablet Commonly known as: ELIQUIS Take 1 tablet (5 mg total) by mouth 2 (two) times daily.    ascorbic acid 500 MG tablet Commonly known as: VITAMIN C Take 500 mg by mouth daily.    azaTHIOprine 50 MG tablet Commonly known as: IMURAN Take 3 tablets (150 mg total) by mouth daily.    calcium carbonate 500 MG chewable tablet Commonly known as: TUMS - dosed in mg elemental calcium Chew 1,000 mg by mouth every 8 (eight) hours as needed for heartburn.    Cholecalciferol 50 MCG  (2000 UT) Tabs Take 2,000 Units by mouth daily at 6 (six) AM.    ezetimibe 10 MG tablet Commonly known as: ZETIA Take 1 tablet (10 mg total) by mouth daily.    glipiZIDE 5 MG tablet Commonly known as: GLUCOTROL Take 5 mg by mouth 2 (two) times daily.    Hemorrhoidal 0.25-14-71.9 % Oint Generic drug: Phenylephrine-Mineral Oil-Pet Place 1 application rectally every 6 (six) hours as needed (hemorrhoid discomfort).    hydrALAZINE 50 MG tablet Commonly known as: APRESOLINE Take 1 tablet (50 mg total) by mouth every 8 (eight) hours.    Imodium A-D 2 MG tablet Generic drug: loperamide Take 4 mg by mouth as needed for diarrhea or loose stools.    insulin lispro 100 UNIT/ML KwikPen Commonly known as: HUMALOG Inject 2-15 Units into the skin 3 (three) times daily before meals. CBG < 121 = 0 units, 121-150 = 2 units, 151-200 = 3 units, 201-250 = 5 units, 251-300 = 8 units, 301-400 = 15 units.    Lantus SoloStar 100 UNIT/ML Solostar Pen Generic drug: insulin glargine Inject 14 Units into the skin 2 (two) times daily. Prime pen with 2u prior to each use    levothyroxine 100 MCG tablet Commonly known as: SYNTHROID Take 1 tablet (100 mcg total) by mouth daily before breakfast.    lidocaine 5 % Commonly known as: LIDODERM Place 1 patch onto the skin daily. On for 12 hours, off for 12 hours.    loratadine 10 MG tablet Commonly known as: CLARITIN Take 10 mg by mouth daily.    losartan 50 MG tablet Commonly known as: COZAAR Take 1 tablet (50 mg total) by mouth daily.    magnesium oxide 400 (240 Mg) MG tablet Commonly known as: MAG-OX Take 1 tablet (400 mg total) by mouth daily.    magnesium oxide 400 MG tablet Commonly known as: MAG-OX Take 400 mg by mouth daily.    melatonin 5 MG Tabs Take 5  mg by mouth at bedtime.    MILK OF MAGNESIA PO Take 30 mLs by mouth 2 (two) times daily as needed (constipation).    nystatin powder Generic drug: nystatin Apply 1 Application  topically 3 (three) times daily as needed (redness under abdominal folds and breasts).    polyethylene glycol 17 g packet Commonly known as: MIRALAX / GLYCOLAX Take 17 g by mouth daily.    potassium chloride 10 MEQ CR capsule Commonly known as: MICRO-K Take 20 mEq by mouth 2 (two) times daily.    predniSONE 10 MG tablet Commonly known as: DELTASONE Take 10 mg by mouth daily.    sertraline 50 MG tablet Commonly known as: ZOLOFT Take 50 mg by mouth daily.    zinc sulfate (50mg  elemental zinc) 220 (50 Zn) MG capsule Take 220 mg by mouth daily.   Relevant Imaging Results:  Relevant Lab Results:   Additional Information SS# [id number]  [place]arn [place], [place]

## 2023-05-02 ENCOUNTER — Ambulatory Visit: Payer: Medicare Other | Admitting: Cardiology

## 2023-05-02 NOTE — Consult Note (Signed)
[facility] Liaison Note  05/02/2023  PRIMA STOVES 1945-09-27 [id number]  Location: RN Hospital Liaison screened the patient remotely at [facility].  Insurance: [employer]   [name] is a 77 y.o. female who is a Primary Care Patient of Hague, Myrene Galas, MD -[facility]. The patient was screened for  readmission hospitalization with noted extreme risk score for unplanned readmission risk with in 6 months.  The patient was assessed for potential Care Management service needs for post hospital transition for care coordination. Review of patient's electronic medical record reveals patient was admitted for Atrial Fibrillation. Pt from [facility] LTF and discharge back to the facility. Facility will continue to address all her needs.   [facility] Care Management/Population Health does not replace or interfere with any arrangements made by the Inpatient Transition of Care team.   For questions contact:   Elliot Cousin, RN, [facility] Liaison [place]   [facility], Population Health Office Hours MTWF  8:00 am-6:00 pm Direct Dial: [phone] mobile [phone] [Office toll free line] Office Hours are M-F 8:30 - 5 pm [email or web address]

## 2023-05-11 ENCOUNTER — Telehealth (INDEPENDENT_AMBULATORY_CARE_PROVIDER_SITE_OTHER): Payer: Self-pay | Admitting: *Deleted

## 2023-05-11 NOTE — Telephone Encounter (Signed)
Orders faxed to [facility] [employer] of [place] fax #[phone]. Lab order per chelsea for NASH fibrosure plus with a note on lab order to fast 8 hours prior to test. This was ordered 04/07/23 and I faxed that day. I refaxed ordered on 05/02/23 since we did not get results. And I'm refaxing today 05/11/23.

## 2023-05-26 ENCOUNTER — Encounter (INDEPENDENT_AMBULATORY_CARE_PROVIDER_SITE_OTHER): Payer: Self-pay | Admitting: *Deleted

## 2023-05-26 NOTE — Telephone Encounter (Signed)
Have tried calling multiple times to [employer] of [place]. No answer. It goes to vm and then says vm is full. I have faxed over orders multiple times. Will mail the orders to [employer].

## 2023-05-26 NOTE — Telephone Encounter (Signed)
Orders mailed with a letter to [employer]

## 2023-06-09 ENCOUNTER — Other Ambulatory Visit: Payer: Self-pay

## 2023-06-09 ENCOUNTER — Inpatient Hospital Stay (HOSPITAL_COMMUNITY)
Admission: EM | Admit: 2023-06-09 | Discharge: 2023-06-20 | DRG: 309 | Disposition: A | Discharge status: Home / Self Care | Payer: Medicare Other | Attending: Family Medicine | Admitting: Family Medicine

## 2023-06-09 ENCOUNTER — Encounter (HOSPITAL_COMMUNITY): Payer: Self-pay | Admitting: Emergency Medicine

## 2023-06-09 ENCOUNTER — Emergency Department (HOSPITAL_COMMUNITY): Payer: Medicare Other

## 2023-06-09 DIAGNOSIS — Z885 Allergy status to narcotic agent status: Secondary | ICD-10-CM

## 2023-06-09 DIAGNOSIS — E875 Hyperkalemia: Secondary | ICD-10-CM | POA: Diagnosis present

## 2023-06-09 DIAGNOSIS — I130 Hypertensive heart and chronic kidney disease with heart failure and stage 1 through stage 4 chronic kidney disease, or unspecified chronic kidney disease: Secondary | ICD-10-CM | POA: Diagnosis present

## 2023-06-09 DIAGNOSIS — I10 Essential (primary) hypertension: Secondary | ICD-10-CM | POA: Diagnosis not present

## 2023-06-09 DIAGNOSIS — E119 Type 2 diabetes mellitus without complications: Secondary | ICD-10-CM

## 2023-06-09 DIAGNOSIS — E1122 Type 2 diabetes mellitus with diabetic chronic kidney disease: Secondary | ICD-10-CM | POA: Diagnosis present

## 2023-06-09 DIAGNOSIS — Z823 Family history of stroke: Secondary | ICD-10-CM

## 2023-06-09 DIAGNOSIS — D72819 Decreased white blood cell count, unspecified: Secondary | ICD-10-CM | POA: Diagnosis present

## 2023-06-09 DIAGNOSIS — Z794 Long term (current) use of insulin: Secondary | ICD-10-CM

## 2023-06-09 DIAGNOSIS — Z90710 Acquired absence of both cervix and uterus: Secondary | ICD-10-CM

## 2023-06-09 DIAGNOSIS — E785 Hyperlipidemia, unspecified: Secondary | ICD-10-CM | POA: Diagnosis present

## 2023-06-09 DIAGNOSIS — J449 Chronic obstructive pulmonary disease, unspecified: Secondary | ICD-10-CM | POA: Diagnosis present

## 2023-06-09 DIAGNOSIS — Z1152 Encounter for screening for COVID-19: Secondary | ICD-10-CM

## 2023-06-09 DIAGNOSIS — R000 Tachycardia, unspecified: Secondary | ICD-10-CM | POA: Diagnosis not present

## 2023-06-09 DIAGNOSIS — I4819 Other persistent atrial fibrillation: Secondary | ICD-10-CM | POA: Diagnosis not present

## 2023-06-09 DIAGNOSIS — H547 Unspecified visual loss: Secondary | ICD-10-CM | POA: Diagnosis present

## 2023-06-09 DIAGNOSIS — K219 Gastro-esophageal reflux disease without esophagitis: Secondary | ICD-10-CM | POA: Diagnosis present

## 2023-06-09 DIAGNOSIS — Z79624 Long term (current) use of inhibitors of nucleotide synthesis: Secondary | ICD-10-CM

## 2023-06-09 DIAGNOSIS — D649 Anemia, unspecified: Secondary | ICD-10-CM | POA: Diagnosis present

## 2023-06-09 DIAGNOSIS — R002 Palpitations: Secondary | ICD-10-CM | POA: Diagnosis present

## 2023-06-09 DIAGNOSIS — I472 Ventricular tachycardia, unspecified: Secondary | ICD-10-CM | POA: Diagnosis present

## 2023-06-09 DIAGNOSIS — I4891 Unspecified atrial fibrillation: Secondary | ICD-10-CM | POA: Diagnosis not present

## 2023-06-09 DIAGNOSIS — F32A Depression, unspecified: Secondary | ICD-10-CM | POA: Diagnosis present

## 2023-06-09 DIAGNOSIS — E1165 Type 2 diabetes mellitus with hyperglycemia: Secondary | ICD-10-CM | POA: Diagnosis present

## 2023-06-09 DIAGNOSIS — E860 Dehydration: Secondary | ICD-10-CM | POA: Diagnosis present

## 2023-06-09 DIAGNOSIS — E039 Hypothyroidism, unspecified: Secondary | ICD-10-CM | POA: Diagnosis present

## 2023-06-09 DIAGNOSIS — B974 Respiratory syncytial virus as the cause of diseases classified elsewhere: Secondary | ICD-10-CM | POA: Diagnosis present

## 2023-06-09 DIAGNOSIS — I4892 Unspecified atrial flutter: Secondary | ICD-10-CM | POA: Diagnosis not present

## 2023-06-09 DIAGNOSIS — Z7901 Long term (current) use of anticoagulants: Secondary | ICD-10-CM

## 2023-06-09 DIAGNOSIS — E66811 Obesity, class 1: Secondary | ICD-10-CM | POA: Diagnosis present

## 2023-06-09 DIAGNOSIS — Z87891 Personal history of nicotine dependence: Secondary | ICD-10-CM

## 2023-06-09 DIAGNOSIS — Z7984 Long term (current) use of oral hypoglycemic drugs: Secondary | ICD-10-CM

## 2023-06-09 DIAGNOSIS — E66812 Obesity, class 2: Secondary | ICD-10-CM | POA: Diagnosis present

## 2023-06-09 DIAGNOSIS — I5022 Chronic systolic (congestive) heart failure: Secondary | ICD-10-CM | POA: Diagnosis present

## 2023-06-09 DIAGNOSIS — E222 Syndrome of inappropriate secretion of antidiuretic hormone: Secondary | ICD-10-CM | POA: Diagnosis present

## 2023-06-09 DIAGNOSIS — Z79890 Hormone replacement therapy (postmenopausal): Secondary | ICD-10-CM

## 2023-06-09 DIAGNOSIS — Z8249 Family history of ischemic heart disease and other diseases of the circulatory system: Secondary | ICD-10-CM

## 2023-06-09 DIAGNOSIS — I5031 Acute diastolic (congestive) heart failure: Secondary | ICD-10-CM | POA: Diagnosis present

## 2023-06-09 DIAGNOSIS — I480 Paroxysmal atrial fibrillation: Secondary | ICD-10-CM | POA: Diagnosis present

## 2023-06-09 DIAGNOSIS — Z6835 Body mass index (BMI) 35.0-35.9, adult: Secondary | ICD-10-CM

## 2023-06-09 DIAGNOSIS — E876 Hypokalemia: Secondary | ICD-10-CM | POA: Diagnosis present

## 2023-06-09 DIAGNOSIS — R0600 Dyspnea, unspecified: Secondary | ICD-10-CM | POA: Diagnosis present

## 2023-06-09 DIAGNOSIS — K754 Autoimmune hepatitis: Secondary | ICD-10-CM | POA: Diagnosis present

## 2023-06-09 DIAGNOSIS — Z79899 Other long term (current) drug therapy: Secondary | ICD-10-CM

## 2023-06-09 DIAGNOSIS — J069 Acute upper respiratory infection, unspecified: Secondary | ICD-10-CM | POA: Diagnosis present

## 2023-06-09 DIAGNOSIS — Z7952 Long term (current) use of systemic steroids: Secondary | ICD-10-CM

## 2023-06-09 DIAGNOSIS — N183 Chronic kidney disease, stage 3 unspecified: Secondary | ICD-10-CM | POA: Diagnosis present

## 2023-06-09 DIAGNOSIS — I2510 Atherosclerotic heart disease of native coronary artery without angina pectoris: Secondary | ICD-10-CM | POA: Diagnosis present

## 2023-06-09 DIAGNOSIS — Z993 Dependence on wheelchair: Secondary | ICD-10-CM

## 2023-06-09 DIAGNOSIS — Z955 Presence of coronary angioplasty implant and graft: Secondary | ICD-10-CM

## 2023-06-09 LAB — CBC WITH DIFFERENTIAL/PLATELET
Abs Immature Granulocytes: 0.03 10*3/uL (ref 0.00–0.07)
Basophils Absolute: 0 10*3/uL (ref 0.0–0.1)
Basophils Relative: 1 %
Eosinophils Absolute: 0 10*3/uL (ref 0.0–0.5)
Eosinophils Relative: 1 %
HCT: 31.9 % — ABNORMAL LOW (ref 36.0–46.0)
Hemoglobin: 10.2 g/dL — ABNORMAL LOW (ref 12.0–15.0)
Immature Granulocytes: 1 %
Lymphocytes Relative: 9 %
Lymphs Abs: 0.2 10*3/uL — ABNORMAL LOW (ref 0.7–4.0)
MCH: 36.4 pg — ABNORMAL HIGH (ref 26.0–34.0)
MCHC: 32 g/dL (ref 30.0–36.0)
MCV: 113.9 fL — ABNORMAL HIGH (ref 80.0–100.0)
Monocytes Absolute: 0.2 10*3/uL (ref 0.1–1.0)
Monocytes Relative: 8 %
Neutro Abs: 1.7 10*3/uL (ref 1.7–7.7)
Neutrophils Relative %: 80 %
Platelets: 155 10*3/uL (ref 150–400)
RBC: 2.8 MIL/uL — ABNORMAL LOW (ref 3.87–5.11)
RDW: 19.9 % — ABNORMAL HIGH (ref 11.5–15.5)
WBC: 2.1 10*3/uL — ABNORMAL LOW (ref 4.0–10.5)
nRBC: 0 % (ref 0.0–0.2)

## 2023-06-09 LAB — COMPREHENSIVE METABOLIC PANEL
ALT: 54 U/L — ABNORMAL HIGH (ref 0–44)
AST: 118 U/L — ABNORMAL HIGH (ref 15–41)
Albumin: 3 g/dL — ABNORMAL LOW (ref 3.5–5.0)
Alkaline Phosphatase: 81 U/L (ref 38–126)
Anion gap: 15 (ref 5–15)
BUN: 16 mg/dL (ref 8–23)
CO2: 21 mmol/L — ABNORMAL LOW (ref 22–32)
Calcium: 8.6 mg/dL — ABNORMAL LOW (ref 8.9–10.3)
Chloride: 93 mmol/L — ABNORMAL LOW (ref 98–111)
Creatinine, Ser: 0.89 mg/dL (ref 0.44–1.00)
GFR, Estimated: >60 mL/min (ref >60)
Glucose, Bld: 155 mg/dL — ABNORMAL HIGH (ref 70–99)
Potassium: 5.2 mmol/L — ABNORMAL HIGH (ref 3.5–5.1)
Sodium: 129 mmol/L — ABNORMAL LOW (ref 135–145)
Total Bilirubin: 2 mg/dL — ABNORMAL HIGH (ref 0.0–1.2)
Total Protein: 6.2 g/dL — ABNORMAL LOW (ref 6.5–8.1)

## 2023-06-09 LAB — BRAIN NATRIURETIC PEPTIDE: B Natriuretic Peptide: 368 pg/mL — ABNORMAL HIGH (ref 0.0–100.0)

## 2023-06-09 LAB — MRSA NEXT GEN BY PCR, NASAL: MRSA by PCR Next Gen: NOT DETECTED

## 2023-06-09 LAB — TROPONIN I (HIGH SENSITIVITY)
Troponin I (High Sensitivity): 43 ng/L — ABNORMAL HIGH (ref <18)
Troponin I (High Sensitivity): 37 ng/L — ABNORMAL HIGH (ref <18)

## 2023-06-09 LAB — GLUCOSE, CAPILLARY
Glucose-Capillary: 182 mg/dL — ABNORMAL HIGH (ref 70–99)
Glucose-Capillary: 172 mg/dL — ABNORMAL HIGH (ref 70–99)

## 2023-06-09 LAB — TSH: TSH: 0.461 u[IU]/mL (ref 0.350–4.500)

## 2023-06-09 LAB — MAGNESIUM: Magnesium: 1.8 mg/dL (ref 1.7–2.4)

## 2023-06-09 MED ORDER — AMIODARONE HCL IN DEXTROSE 360-4.14 MG/200ML-% IV SOLN
60.0000 mg/h | INTRAVENOUS | Status: AC
  Administered 2023-06-09 (×2): 60 mg/h via INTRAVENOUS
  Filled 2023-06-09 (×2): qty 200

## 2023-06-09 MED ORDER — ACETAMINOPHEN 325 MG PO TABS: 650.0000 mg | ORAL_TABLET | Freq: Four times a day (QID) | ORAL | Status: DC | PRN

## 2023-06-09 MED ORDER — CALCIUM GLUCONATE-NACL 1-0.675 GM/50ML-% IV SOLN
1.0000 g | Freq: Once | INTRAVENOUS | Status: AC
  Administered 2023-06-09: 1000 mg via INTRAVENOUS
  Filled 2023-06-09: qty 50

## 2023-06-09 MED ORDER — SODIUM CHLORIDE 0.9 % IV SOLN: INTRAVENOUS | Status: AC

## 2023-06-09 MED ORDER — INSULIN ASPART 100 UNIT/ML IV SOLN
10.0000 [IU] | Freq: Once | INTRAVENOUS | Status: AC
  Administered 2023-06-09: 10 [IU] via INTRAVENOUS

## 2023-06-09 MED ORDER — AMIODARONE LOAD VIA INFUSION
150.0000 mg | Freq: Once | INTRAVENOUS | Status: AC
  Administered 2023-06-09: 150 mg via INTRAVENOUS
  Filled 2023-06-09: qty 83.34

## 2023-06-09 MED ORDER — LEVALBUTEROL HCL 0.63 MG/3ML IN NEBU: 0.6300 mg | INHALATION_SOLUTION | Freq: Four times a day (QID) | RESPIRATORY_TRACT | Status: DC | PRN

## 2023-06-09 MED ORDER — DILTIAZEM HCL-DEXTROSE 125-5 MG/125ML-% IV SOLN (PREMIX)
5.0000 mg/h | INTRAVENOUS | Status: DC
  Administered 2023-06-09: 5 mg/h via INTRAVENOUS
  Filled 2023-06-09: qty 125

## 2023-06-09 MED ORDER — AMIODARONE HCL IN DEXTROSE 360-4.14 MG/200ML-% IV SOLN
30.0000 mg/h | INTRAVENOUS | Status: DC
  Administered 2023-06-09 – 2023-06-16 (×15): 30 mg/h via INTRAVENOUS
  Administered 2023-06-16 (×2): 60 mg/h via INTRAVENOUS
  Administered 2023-06-17: 30 mg/h via INTRAVENOUS
  Administered 2023-06-17: 60 mg/h via INTRAVENOUS
  Administered 2023-06-18: 30 mg/h via INTRAVENOUS
  Administered 2023-06-18: 30.006 mg/h via INTRAVENOUS
  Filled 2023-06-09 (×22): qty 200

## 2023-06-09 MED ORDER — ONDANSETRON HCL 4 MG/2ML IJ SOLN: 4.0000 mg | Freq: Four times a day (QID) | INTRAMUSCULAR | Status: DC | PRN

## 2023-06-09 MED ORDER — SODIUM ZIRCONIUM CYCLOSILICATE 10 G PO PACK
10.0000 g | PACK | Freq: Once | ORAL | Status: AC
  Administered 2023-06-09: 10 g via ORAL
  Filled 2023-06-09: qty 1

## 2023-06-09 MED ORDER — INSULIN ASPART 100 UNIT/ML IJ SOLN
0.0000 [IU] | INTRAMUSCULAR | Status: DC
  Administered 2023-06-09 – 2023-06-10 (×3): 2 [IU] via SUBCUTANEOUS

## 2023-06-09 MED ORDER — ACETAMINOPHEN 650 MG RE SUPP: 650.0000 mg | Freq: Four times a day (QID) | RECTAL | Status: DC | PRN

## 2023-06-09 MED ORDER — CHLORHEXIDINE GLUCONATE CLOTH 2 % EX PADS
6.0000 | MEDICATED_PAD | Freq: Every day | CUTANEOUS | Status: DC
Start: 2023-06-10 — End: 2023-06-20
  Administered 2023-06-10 – 2023-06-20 (×11): 6 via TOPICAL

## 2023-06-09 MED ORDER — DEXTROSE 50 % IV SOLN
1.0000 | Freq: Once | INTRAVENOUS | Status: AC
  Administered 2023-06-09: 50 mL via INTRAVENOUS
  Filled 2023-06-09: qty 50

## 2023-06-09 MED ORDER — ONDANSETRON HCL 4 MG PO TABS
4.0000 mg | ORAL_TABLET | Freq: Four times a day (QID) | ORAL | Status: DC | PRN
  Administered 2023-06-16 – 2023-06-20 (×3): 4 mg via ORAL
  Filled 2023-06-09 (×3): qty 1

## 2023-06-09 NOTE — Progress Notes (Signed)
 Insulin dose of 10 units to be given IV per Dr.Shah. Route/Dose verified with Heywood Footman.
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
## 2023-06-09 NOTE — Progress Notes (Signed)
 PICC team called, unable to place IV tonight.  Aware pt.has working IV and that pt. Will need  PICC line for lab draws and concern with IV's blowing.
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
## 2023-06-09 NOTE — ED Notes (Addendum)
 Second phlebotomist attempting to obtain blood work, first phlebotomist unable to obtain blood work. Also RN unable to obtain blood work from IV

## 2023-06-09 NOTE — ED Triage Notes (Signed)
 Pt arrived via [facility] from [facility] for abnormal labs and cough. Facility sent lab work with pt. Cbg [phone]

## 2023-06-09 NOTE — Progress Notes (Addendum)
 PICC order received. Primary RN aware PICC will not be placed tonight.Per Primary patient has working PIV.

## 2023-06-09 NOTE — ED Notes (Signed)
 MD made aware of fast HR, EKG given to MD

## 2023-06-09 NOTE — ED Notes (Signed)
 Cardizem drip stopped and amiodarone drip started

## 2023-06-10 ENCOUNTER — Inpatient Hospital Stay (HOSPITAL_COMMUNITY): Payer: Medicare Other

## 2023-06-10 DIAGNOSIS — I4891 Unspecified atrial fibrillation: Secondary | ICD-10-CM | POA: Diagnosis not present

## 2023-06-10 DIAGNOSIS — I4892 Unspecified atrial flutter: Secondary | ICD-10-CM | POA: Diagnosis not present

## 2023-06-10 LAB — CBC
HCT: 31.8 % — ABNORMAL LOW (ref 36.0–46.0)
Hemoglobin: 10.4 g/dL — ABNORMAL LOW (ref 12.0–15.0)
MCH: 37.3 pg — ABNORMAL HIGH (ref 26.0–34.0)
MCHC: 32.7 g/dL (ref 30.0–36.0)
MCV: 114 fL — ABNORMAL HIGH (ref 80.0–100.0)
Platelets: 163 10*3/uL (ref 150–400)
RBC: 2.79 MIL/uL — ABNORMAL LOW (ref 3.87–5.11)
RDW: 20 % — ABNORMAL HIGH (ref 11.5–15.5)
WBC: 1.7 10*3/uL — ABNORMAL LOW (ref 4.0–10.5)
nRBC: 0 % (ref 0.0–0.2)

## 2023-06-10 LAB — COMPREHENSIVE METABOLIC PANEL
ALT: 49 U/L — ABNORMAL HIGH (ref 0–44)
AST: 101 U/L — ABNORMAL HIGH (ref 15–41)
Albumin: 2.8 g/dL — ABNORMAL LOW (ref 3.5–5.0)
Alkaline Phosphatase: 80 U/L (ref 38–126)
Anion gap: 9 (ref 5–15)
BUN: 15 mg/dL (ref 8–23)
CO2: 25 mmol/L (ref 22–32)
Calcium: 8.6 mg/dL — ABNORMAL LOW (ref 8.9–10.3)
Chloride: 95 mmol/L — ABNORMAL LOW (ref 98–111)
Creatinine, Ser: 0.64 mg/dL (ref 0.44–1.00)
GFR, Estimated: >60 mL/min (ref >60)
Glucose, Bld: 122 mg/dL — ABNORMAL HIGH (ref 70–99)
Potassium: 4.1 mmol/L (ref 3.5–5.1)
Sodium: 129 mmol/L — ABNORMAL LOW (ref 135–145)
Total Bilirubin: 1.4 mg/dL — ABNORMAL HIGH (ref 0.0–1.2)
Total Protein: 6.1 g/dL — ABNORMAL LOW (ref 6.5–8.1)

## 2023-06-10 LAB — RESP PANEL BY RT-PCR (RSV, FLU A&B, COVID)  RVPGX2
Influenza A by PCR: NEGATIVE
Influenza B by PCR: NEGATIVE
Resp Syncytial Virus by PCR: POSITIVE — AB
SARS Coronavirus 2 by RT PCR: NEGATIVE

## 2023-06-10 LAB — GLUCOSE, CAPILLARY
Glucose-Capillary: 102 mg/dL — ABNORMAL HIGH (ref 70–99)
Glucose-Capillary: 123 mg/dL — ABNORMAL HIGH (ref 70–99)
Glucose-Capillary: 152 mg/dL — ABNORMAL HIGH (ref 70–99)
Glucose-Capillary: 171 mg/dL — ABNORMAL HIGH (ref 70–99)
Glucose-Capillary: 173 mg/dL — ABNORMAL HIGH (ref 70–99)
Glucose-Capillary: 240 mg/dL — ABNORMAL HIGH (ref 70–99)

## 2023-06-10 LAB — MAGNESIUM: Magnesium: 1.7 mg/dL (ref 1.7–2.4)

## 2023-06-10 MED ORDER — GUAIFENESIN-DM 100-10 MG/5ML PO SYRP
5.0000 mL | ORAL_SOLUTION | ORAL | Status: DC | PRN
  Administered 2023-06-10 – 2023-06-12 (×8): 5 mL via ORAL
  Filled 2023-06-10 (×9): qty 5

## 2023-06-10 MED ORDER — AZATHIOPRINE 50 MG PO TABS
150.0000 mg | ORAL_TABLET | Freq: Every day | ORAL | Status: DC
  Administered 2023-06-10 – 2023-06-20 (×11): 150 mg via ORAL
  Filled 2023-06-10 (×15): qty 3

## 2023-06-10 MED ORDER — AMITRIPTYLINE HCL 25 MG PO TABS
50.0000 mg | ORAL_TABLET | Freq: Every day | ORAL | Status: DC
Start: 2023-06-10 — End: 2023-06-20
  Administered 2023-06-10 – 2023-06-19 (×10): 50 mg via ORAL
  Filled 2023-06-10 (×10): qty 2

## 2023-06-10 MED ORDER — AMIODARONE LOAD VIA INFUSION
150.0000 mg | Freq: Once | INTRAVENOUS | Status: AC
  Administered 2023-06-10: 150 mg via INTRAVENOUS
  Filled 2023-06-10: qty 83.34

## 2023-06-10 MED ORDER — SODIUM CHLORIDE 0.9% FLUSH: 10.0000 mL | INTRAVENOUS | Status: DC | PRN

## 2023-06-10 MED ORDER — SERTRALINE HCL 50 MG PO TABS
50.0000 mg | ORAL_TABLET | Freq: Every day | ORAL | Status: DC
  Administered 2023-06-10 – 2023-06-20 (×11): 50 mg via ORAL
  Filled 2023-06-10 (×11): qty 1

## 2023-06-10 MED ORDER — EZETIMIBE 10 MG PO TABS
10.0000 mg | ORAL_TABLET | Freq: Every day | ORAL | Status: DC
  Administered 2023-06-10 – 2023-06-20 (×11): 10 mg via ORAL
  Filled 2023-06-10 (×11): qty 1

## 2023-06-10 MED ORDER — INSULIN ASPART 100 UNIT/ML IJ SOLN
0.0000 [IU] | Freq: Three times a day (TID) | INTRAMUSCULAR | Status: DC
  Administered 2023-06-10: 3 [IU] via SUBCUTANEOUS
  Administered 2023-06-11 – 2023-06-12 (×4): 2 [IU] via SUBCUTANEOUS
  Administered 2023-06-12 (×2): 1 [IU] via SUBCUTANEOUS
  Administered 2023-06-13: 3 [IU] via SUBCUTANEOUS
  Administered 2023-06-13 – 2023-06-14 (×3): 2 [IU] via SUBCUTANEOUS
  Administered 2023-06-14: 5 [IU] via SUBCUTANEOUS
  Administered 2023-06-14: 2 [IU] via SUBCUTANEOUS
  Administered 2023-06-15: 3 [IU] via SUBCUTANEOUS
  Administered 2023-06-15: 2 [IU] via SUBCUTANEOUS
  Administered 2023-06-15: 3 [IU] via SUBCUTANEOUS
  Administered 2023-06-16: 5 [IU] via SUBCUTANEOUS
  Administered 2023-06-16: 2 [IU] via SUBCUTANEOUS
  Administered 2023-06-16: 5 [IU] via SUBCUTANEOUS
  Administered 2023-06-17: 3 [IU] via SUBCUTANEOUS
  Administered 2023-06-17 (×2): 2 [IU] via SUBCUTANEOUS
  Administered 2023-06-18: 3 [IU] via SUBCUTANEOUS
  Administered 2023-06-18: 1 [IU] via SUBCUTANEOUS
  Administered 2023-06-18: 2 [IU] via SUBCUTANEOUS
  Administered 2023-06-19: 5 [IU] via SUBCUTANEOUS
  Administered 2023-06-19: 3 [IU] via SUBCUTANEOUS
  Administered 2023-06-19: 1 [IU] via SUBCUTANEOUS
  Administered 2023-06-20: 2 [IU] via SUBCUTANEOUS

## 2023-06-10 MED ORDER — INSULIN ASPART 100 UNIT/ML IJ SOLN
0.0000 [IU] | Freq: Every day | INTRAMUSCULAR | Status: DC
  Administered 2023-06-11 – 2023-06-15 (×3): 2 [IU] via SUBCUTANEOUS

## 2023-06-10 MED ORDER — BOOST / RESOURCE BREEZE PO LIQD CUSTOM
1.0000 | Freq: Three times a day (TID) | ORAL | Status: DC
Start: 2023-06-10 — End: 2023-06-16
  Administered 2023-06-10 – 2023-06-11 (×4): 1 via ORAL
  Administered 2023-06-11: 237 mL via ORAL
  Administered 2023-06-12: 1 via ORAL

## 2023-06-10 MED ORDER — APIXABAN 5 MG PO TABS
5.0000 mg | ORAL_TABLET | Freq: Two times a day (BID) | ORAL | Status: DC
  Administered 2023-06-10 – 2023-06-20 (×21): 5 mg via ORAL
  Filled 2023-06-10 (×21): qty 1

## 2023-06-10 MED ORDER — METOPROLOL TARTRATE 5 MG/5ML IV SOLN
5.0000 mg | Freq: Four times a day (QID) | INTRAVENOUS | Status: DC | PRN
  Administered 2023-06-10 – 2023-06-13 (×3): 5 mg via INTRAVENOUS
  Filled 2023-06-10 (×3): qty 5

## 2023-06-10 MED ORDER — LOSARTAN POTASSIUM 50 MG PO TABS
50.0000 mg | ORAL_TABLET | Freq: Every day | ORAL | Status: DC
  Administered 2023-06-10 – 2023-06-16 (×7): 50 mg via ORAL
  Filled 2023-06-10 (×7): qty 1

## 2023-06-10 MED ORDER — HYDRALAZINE HCL 25 MG PO TABS
50.0000 mg | ORAL_TABLET | Freq: Three times a day (TID) | ORAL | Status: DC
  Administered 2023-06-10 – 2023-06-20 (×29): 50 mg via ORAL
  Filled 2023-06-10: qty 2
  Filled 2023-06-10 (×2): qty 1
  Filled 2023-06-10: qty 2
  Filled 2023-06-10: qty 1
  Filled 2023-06-10 (×3): qty 2
  Filled 2023-06-10: qty 1
  Filled 2023-06-10 (×2): qty 2
  Filled 2023-06-10: qty 1
  Filled 2023-06-10: qty 2
  Filled 2023-06-10: qty 1
  Filled 2023-06-10 (×3): qty 2
  Filled 2023-06-10 (×4): qty 1
  Filled 2023-06-10 (×2): qty 2
  Filled 2023-06-10: qty 1
  Filled 2023-06-10 (×2): qty 2
  Filled 2023-06-10 (×2): qty 1
  Filled 2023-06-10 (×3): qty 2

## 2023-06-10 MED ORDER — IPRATROPIUM-ALBUTEROL 0.5-2.5 (3) MG/3ML IN SOLN
3.0000 mL | Freq: Four times a day (QID) | RESPIRATORY_TRACT | Status: AC
  Administered 2023-06-10 (×2): 3 mL via RESPIRATORY_TRACT
  Filled 2023-06-10 (×2): qty 3

## 2023-06-10 MED ORDER — SODIUM CHLORIDE 0.9% FLUSH
10.0000 mL | Freq: Two times a day (BID) | INTRAVENOUS | Status: DC
  Administered 2023-06-10 – 2023-06-19 (×19): 10 mL

## 2023-06-10 MED ORDER — PREDNISONE 10 MG PO TABS
10.0000 mg | ORAL_TABLET | Freq: Every day | ORAL | Status: DC
  Administered 2023-06-10 – 2023-06-20 (×11): 10 mg via ORAL
  Filled 2023-06-10 (×11): qty 1

## 2023-06-10 MED ORDER — LEVOTHYROXINE SODIUM 100 MCG PO TABS
100.0000 ug | ORAL_TABLET | Freq: Every day | ORAL | Status: DC
Start: 2023-06-11 — End: 2023-06-20
  Administered 2023-06-11 – 2023-06-20 (×10): 100 ug via ORAL
  Filled 2023-06-10 (×10): qty 1

## 2023-06-10 NOTE — Plan of Care (Signed)
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
## 2023-06-10 NOTE — TOC Initial Note (Signed)
 Transition of Care [facility]) - Initial/Assessment Note    Patient Details  Name: [name] MRN: [id number] Date of Birth: 06-07-1945  [facility]) CM/SW Contact:    Lucie Lunger, LCSWA Phone Number: 06/10/2023, 12:56 PM  Clinical Narrative:                 Pt is high risk for readmission. CSW notes per chart review that pt admitted from [facility]. CSW reached out to pts sister Almetta to complete assessment. Pts sister states plan will be for return to ALF. Per chart review pt has had HH in the past. Pt has a wheelchair to use when at the facility. TOC to follow.   Expected Discharge Plan: Assisted Living Barriers to Discharge: Continued Medical Work up   Patient Goals and CMS Choice Patient states their goals for this hospitalization and ongoing recovery are:: return to ALF CMS Medicare.gov Compare Post Acute Care list provided to:: Patient Represenative (must comment) Choice offered to / list presented to : Sibling      Expected Discharge Plan and Services In-house Referral: Clinical Social Work Discharge Planning Services: CM Consult Post Acute Care Choice: Home Health Living arrangements for the past 2 months: Assisted Living Facility                                      Prior Living Arrangements/Services Living arrangements for the past 2 months: Assisted Living Facility Lives with:: Facility Resident Patient language and need for interpreter reviewed:: Yes Do you feel safe going back to the place where you live?: Yes      Need for Family Participation in Patient Care: Yes (Comment) Care giver support system in place?: Yes (comment)   Criminal Activity/Legal Involvement Pertinent to Current Situation/Hospitalization: No - Comment as needed  Activities of Daily Living   ADL Screening (condition at time of admission) Independently performs ADLs?: Yes (appropriate for developmental age) Is the patient deaf or have difficulty hearing?:  No Does the patient have difficulty seeing, even when wearing glasses/contacts?: No Does the patient have difficulty concentrating, remembering, or making decisions?: No  Permission Sought/Granted                  Emotional Assessment Appearance:: Appears stated age       Alcohol  / Substance Use: Not Applicable Psych Involvement: No (comment)  Admission diagnosis:  Atrial flutter with rapid ventricular response (HCC) [I48.92] Patient Active Problem List   Diagnosis Date Noted   Atrial flutter with rapid ventricular response (HCC) 06/09/2023   Hypoglycemia 04/25/2023   Atrial fibrillation with RVR (HCC) 04/20/2023   AMS (altered mental status) 02/24/2023   Paroxysmal atrial fibrillation with RVR (HCC) 09/23/2022   Poisoning by unspecified narcotics, intentional self-harm, initial encounter (HCC) 09/22/2022   Pneumonia 09/22/2022   Loose stools 09/14/2022   Bimalleolar fracture of right ankle 06/29/2021   Hypokalemia 06/29/2021   Hyperglycemia due to diabetes mellitus (HCC) 06/29/2021   Insomnia 06/29/2021   Diabetic neuropathy (HCC) 06/29/2021   COVID-19 virus infection 06/29/2021   Autoimmune hepatitis (HCC) 01/20/2021   Elevated LFTs 12/11/2020   NASH (nonalcoholic steatohepatitis) 12/11/2020   Acute lower UTI 06/18/2018   Elevated troponin 06/18/2018   UTI (urinary tract infection) 06/18/2018   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) 06/21/2017   Mastalgia 06/21/2017   Acute diastolic CHF (congestive heart failure) (HCC) 12/20/2016  Adrenal insufficiency (HCC) 12/18/2016   Lobar pneumonia (HCC) 12/15/2016   Acute metabolic encephalopathy 12/15/2016   Thrombocytopenia (HCC) 12/15/2016   CKD (chronic kidney disease) stage 3, GFR 30-59 ml/min (HCC) 12/15/2016   Sepsis (HCC) 12/14/2016   COPD exacerbation (HCC) 04/24/2016   DM type 2 (diabetes mellitus, type 2) (HCC) 04/24/2016   Aortic atherosclerosis (HCC) 04/24/2016   Transaminitis 10/20/2014   Acute  bronchitis 10/20/2014   Acute respiratory failure (HCC) 10/17/2014   IBS (irritable bowel syndrome) 08/07/2012   Lower abdominal pain 10/05/2011   Coronary artery disease    Tobacco abuse, in remission    Gastroesophageal reflux disease    Obesity, Class II, BMI 35-39.9 04/01/2010   BLINDNESS 04/01/2010   Hypothyroidism 09/29/2009   Hyperlipidemia, unspecified 09/29/2009   Essential hypertension 09/29/2009   PCP:  Dawayne Kerney SQUIBB, MD Pharmacy:   [facility] - [place], [place] - [phone] [place] [address] Phone: [phone] Fax: [phone]     Social Drivers of Health (SDOH) Social History: SDOH Screenings   Food Insecurity: No Food Insecurity (06/09/2023)  Housing: Low Risk  (06/09/2023)  Transportation Needs: No Transportation Needs (06/09/2023)  Utilities: Not At Risk (06/09/2023)  Social Connections: Socially Integrated (06/10/2023)  Tobacco Use: Medium Risk (06/09/2023)  Health Literacy: High Risk (09/08/2020)   [facility] from [facility]   SDOH Interventions:     Readmission Risk Interventions    06/10/2023   12:55 PM 04/21/2023    2:15 PM 04/21/2023    2:13 PM  Readmission Risk Prevention Plan  Transportation Screening Complete Complete Complete  PCP or Specialist Appt within 3-5 Days   Not Complete  HRI or Home Care Consult  Complete Complete  Social Work Consult for Recovery Care Planning/Counseling  Complete Complete  Palliative Care Screening  Not Applicable Not Applicable  Medication Review Oceanographer) Complete Complete Complete  HRI or Home Care Consult Complete    SW Recovery Care/Counseling Consult Complete    Palliative Care Screening Not Applicable    Skilled Nursing Facility Not Applicable
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
## 2023-06-10 NOTE — Plan of Care (Signed)
  Problem: Education: Goal: Knowledge of General Education information will improve Description: Including pain rating scale, medication(s)/side effects and non-pharmacologic comfort measures Outcome: Not Progressing   Problem: Health Behavior/Discharge Planning: Goal: Ability to manage health-related needs will improve Outcome: Not Progressing   Problem: Clinical Measurements: Goal: Ability to maintain clinical measurements within normal limits will improve Outcome: Progressing Goal: Will remain free from infection Outcome: Progressing Goal: Diagnostic test results will improve Outcome: Progressing Goal: Respiratory complications will improve Outcome: Progressing Goal: Cardiovascular complication will be avoided Outcome: Not Progressing

## 2023-06-10 NOTE — Progress Notes (Signed)
 Peripherally Inserted Central Catheter Placement  The IV Nurse has discussed with the patient and/or persons authorized to consent for the patient, the purpose of this procedure and the potential benefits and risks involved with this procedure.  The benefits include less needle sticks, lab draws from the catheter, and the patient may be discharged home with the catheter. Risks include, but not limited to, infection, bleeding, blood clot (thrombus formation), and puncture of an artery; nerve damage and irregular heartbeat and possibility to perform a PICC exchange if needed/ordered by physician.  Alternatives to this procedure were also discussed.  Bard Power PICC patient education guide, fact sheet on infection prevention and patient information card has been provided to patient /or left at bedside.  Sister, Almetta, who is POA signed consent who was at bedside.   PICC Placement Documentation  PICC Double Lumen 06/10/23 Left Basilic 43 cm 1 cm (Active)  Indication for Insertion or Continuance of Line Limited venous access - need for IV therapy >5 days (PICC only) 06/10/23 1325  Exposed Catheter (cm) 1 cm 06/10/23 1325  Site Assessment Clean, Dry, Intact 06/10/23 1325  Lumen #1 Status Flushed;Saline locked;Blood return noted 06/10/23 1325  Lumen #2 Status Flushed;Saline locked;Blood return noted 06/10/23 1325  Dressing Type Transparent 06/10/23 1325  Dressing Status Antimicrobial disc/dressing in place 06/10/23 1325  Line Care Connections checked and tightened 06/10/23 1325       Corinthian Kemler M Dellar Traber 06/10/2023, 1:50 PM

## 2023-06-11 DIAGNOSIS — I4892 Unspecified atrial flutter: Secondary | ICD-10-CM | POA: Diagnosis not present

## 2023-06-11 LAB — COMPREHENSIVE METABOLIC PANEL
ALT: 47 U/L — ABNORMAL HIGH (ref 0–44)
AST: 108 U/L — ABNORMAL HIGH (ref 15–41)
Albumin: 2.5 g/dL — ABNORMAL LOW (ref 3.5–5.0)
Alkaline Phosphatase: 74 U/L (ref 38–126)
Anion gap: 9 (ref 5–15)
BUN: 14 mg/dL (ref 8–23)
CO2: 26 mmol/L (ref 22–32)
Calcium: 8.1 mg/dL — ABNORMAL LOW (ref 8.9–10.3)
Chloride: 93 mmol/L — ABNORMAL LOW (ref 98–111)
Creatinine, Ser: 0.67 mg/dL (ref 0.44–1.00)
GFR, Estimated: >60 mL/min (ref >60)
Glucose, Bld: 137 mg/dL — ABNORMAL HIGH (ref 70–99)
Potassium: 3.8 mmol/L (ref 3.5–5.1)
Sodium: 128 mmol/L — ABNORMAL LOW (ref 135–145)
Total Bilirubin: 1.3 mg/dL — ABNORMAL HIGH (ref 0.0–1.2)
Total Protein: 5.6 g/dL — ABNORMAL LOW (ref 6.5–8.1)

## 2023-06-11 LAB — CBC
HCT: 29.6 % — ABNORMAL LOW (ref 36.0–46.0)
Hemoglobin: 9.7 g/dL — ABNORMAL LOW (ref 12.0–15.0)
MCH: 36.7 pg — ABNORMAL HIGH (ref 26.0–34.0)
MCHC: 32.8 g/dL (ref 30.0–36.0)
MCV: 112.1 fL — ABNORMAL HIGH (ref 80.0–100.0)
Platelets: 153 10*3/uL (ref 150–400)
RBC: 2.64 MIL/uL — ABNORMAL LOW (ref 3.87–5.11)
RDW: 20 % — ABNORMAL HIGH (ref 11.5–15.5)
WBC: 1.7 10*3/uL — ABNORMAL LOW (ref 4.0–10.5)
nRBC: 0 % (ref 0.0–0.2)

## 2023-06-11 LAB — MAGNESIUM: Magnesium: 1.6 mg/dL — ABNORMAL LOW (ref 1.7–2.4)

## 2023-06-11 LAB — GLUCOSE, CAPILLARY
Glucose-Capillary: 163 mg/dL — ABNORMAL HIGH (ref 70–99)
Glucose-Capillary: 181 mg/dL — ABNORMAL HIGH (ref 70–99)
Glucose-Capillary: 191 mg/dL — ABNORMAL HIGH (ref 70–99)
Glucose-Capillary: 238 mg/dL — ABNORMAL HIGH (ref 70–99)

## 2023-06-11 MED ORDER — AMIODARONE LOAD VIA INFUSION
150.0000 mg | Freq: Once | INTRAVENOUS | Status: AC
  Administered 2023-06-11: 150 mg via INTRAVENOUS
  Filled 2023-06-11: qty 83.34

## 2023-06-11 MED ORDER — SODIUM CHLORIDE 0.9 % IV SOLN: INTRAVENOUS | Status: AC

## 2023-06-11 NOTE — Progress Notes (Addendum)
 PROGRESS NOTE    [name]  FMW:[id number] DOB: Jun 24, 1945 DOA: 06/09/2023 PCP: Dawayne Kerney SQUIBB, MD   Brief Narrative:    [name] is a 78 y.o. female with medical history significant for COPD, hypertension, dyslipidemia, hypothyroidism, CAD status post stent placement, GERD, type 2 diabetes, blindness, and obesity.  She is wheelchair dependent for the last year and patient is a resident at ALF.  She was brought in from her facility due to dyspnea and also appears to have had trouble with nausea and vomiting for the last 2-3 days.  She was admitted for control of wide-complex tachyarrhythmia in the setting of PAF and remains on IV amiodarone  infusion.  She is now noted to have RSV infection as well.  Assessment & Plan:   Principal Problem:   Atrial flutter with rapid ventricular response (HCC) Active Problems:   Hypothyroidism   Obesity, Class II, BMI 35-39.9   Essential hypertension   Coronary artery disease   Gastroesophageal reflux disease   DM type 2 (diabetes mellitus, type 2) (HCC)   Acute diastolic CHF (congestive heart failure) (HCC)   Autoimmune hepatitis (HCC)   Paroxysmal atrial fibrillation with RVR (HCC)  Assessment and Plan:   Wide-complex tachyarrhythmia in the setting of PAF -Started on IV amiodarone  per cardiology, rebolus again today 1/11 and continue drip -Plan for 2D echocardiogram once heart rate controlled -Poor candidate for ischemic testing -On Eliquis  for anticoagulation  Cough secondary to RSV infection -Continue isolation precaution   Hyponatremia-stable -Likely related to dehydration, will start on IV fluid with normal saline -Follow labs in a.m. 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   Consultants:  Cardiology  Procedures:  None  Antimicrobials:  None   Subjective: Patient seen and evaluated today and overall appears to be doing well, but continues to have elevated heart rates.  Continues to have a cough as well.  Objective: Vitals:   06/12/23 0630 06/12/23 0729 06/12/23 0758 06/12/23 0800  BP: 108/84  98/68 128/77  Pulse: (!) 116  (!) 116 (!) 131  Resp: (!) 21  (!) 26 (!) 22  Temp:  98 F (36.7 C)    TempSrc:  Oral    SpO2: 97%  98% 96%  Weight:      Height:        Intake/Output Summary (Last 24 hours) at 06/12/2023 1022 Last data filed at 06/12/2023 0900 Gross per 24 hour  Intake 1619.96 ml  Output 500 ml  Net 1119.96 ml   Filed Weights   06/12/23 0530  Weight: 80.5 kg    Examination:  General exam: Appears calm and comfortable  Respiratory system: Clear to auscultation. Respiratory effort normal.  Nasal cannula oxygen  Cardiovascular system: S1 & S2 heard, irregular and tachycardic Gastrointestinal  system: Abdomen is soft Central nervous system: Alert and awake Extremities: No edema Skin: No significant lesions noted Psychiatry: Flat affect.    Data Reviewed: I have personally reviewed following labs and imaging studies  CBC: Recent Labs  Lab 06/09/23 1322 06/10/23 0432 06/11/23 0445 06/12/23 0515  WBC 2.1* 1.7* 1.7* 1.8*  NEUTROABS 1.7  --   --   --   HGB  10.2* 10.4* 9.7* 9.0*  HCT 31.9* 31.8* 29.6* 26.7*  MCV 113.9* 114.0* 112.1* 111.3*  PLT 155 163 153 123*   Basic Metabolic Panel: Recent Labs  Lab 06/09/23 1322 06/09/23 1601 06/10/23 0432 06/11/23 0445 06/12/23 0515  NA 129*  --  129* 128* 127*  K 5.2*  --  4.1 3.8 3.7  CL 93*  --  95* 93* 92*  CO2 21*  --  25 26 26   GLUCOSE 155*  --  122* 137* 230*  BUN 16  --  15 14 16   CREATININE 0.89  --  0.64 0.67 0.57  CALCIUM  8.6*  --  8.6* 8.1* 7.6*  MG  --  1.8 1.7 1.6* 1.7   GFR: Estimated Creatinine Clearance: 58.4 mL/min (by C-G formula based on SCr of 0.57 mg/dL). Liver Function Tests: Recent Labs  Lab 06/09/23 1322 06/10/23 0432 06/11/23 0445 06/12/23 0515  AST 118* 101* 108* 96*  ALT 54* 49* 47* 42  ALKPHOS 81 80 74 70  BILITOT 2.0* 1.4* 1.3* 1.4*  PROT 6.2* 6.1* 5.6* 5.1*  ALBUMIN 3.0* 2.8* 2.5* 2.2*   No results for input(s): LIPASE, AMYLASE in the last 168 hours. No results for input(s): AMMONIA in the last 168 hours. Coagulation Profile: No results for input(s): INR, PROTIME in the last 168 hours. Cardiac Enzymes: No results for input(s): CKTOTAL, CKMB, CKMBINDEX, TROPONINI in the last 168 hours. BNP (last 3 results) No results for input(s): PROBNP in the last 8760 hours. HbA1C: No results for input(s): HGBA1C in the last 72 hours. CBG: Recent Labs  Lab 06/11/23 0751 06/11/23 1125 06/11/23 1535 06/11/23 2020 06/12/23 0732  GLUCAP 163* 181* 191* 238* 132*   Lipid Profile: No results for input(s): CHOL, HDL, LDLCALC, TRIG, CHOLHDL, LDLDIRECT in the last 72 hours. Thyroid  Function Tests: Recent Labs    06/09/23 1601  TSH 0.461   Anemia Panel: No results for input(s): VITAMINB12, FOLATE, FERRITIN, TIBC, IRON , RETICCTPCT in the last 72 hours. Sepsis Labs: No results for input(s): PROCALCITON, LATICACIDVEN in the last 168 hours.  Recent Results (from the past 240 hours)  MRSA Next Gen by PCR,  Nasal     Status: None   Collection Time: 06/09/23  2:22 PM   Specimen: Nasal Mucosa; Nasal Swab  Result Value Ref Range Status   MRSA by PCR Next Gen NOT DETECTED NOT DETECTED Final    Comment: (NOTE) The GeneXpert MRSA Assay (FDA approved for NASAL specimens only), is one component of a comprehensive MRSA colonization surveillance program. It is not intended to diagnose MRSA infection nor to guide or monitor treatment for MRSA infections. Test performance is not FDA approved in patients less than 9 years old. Performed at [facility], [address], [place], [place] [address]   Resp panel by RT-PCR (RSV, Flu A&B, Covid) Anterior Nasal Swab     Status: Abnormal   Collection Time: 06/10/23  9:33 AM   Specimen: Anterior Nasal Swab  Result Value Ref Range Status   SARS Coronavirus 2 by RT PCR NEGATIVE NEGATIVE Final    Comment: (NOTE) SARS-CoV-2 target  nucleic acids are NOT DETECTED.  The SARS-CoV-2 RNA is generally detectable in upper respiratory specimens during the acute phase of infection. The lowest concentration of SARS-CoV-2 viral copies this assay can detect is 138 copies/mL. A negative result does not preclude SARS-Cov-2 infection and should not be used as the sole basis for treatment or other patient management decisions. A negative result may occur with  improper specimen collection/handling, submission of specimen other than nasopharyngeal swab, presence of viral mutation(s) within the areas targeted by this assay, and inadequate number of viral copies(<138 copies/mL). A negative result must be combined with clinical observations, patient history, and epidemiological information. The expected result is Negative.  Fact Sheet for Patients:  bloggercourse.com  Fact Sheet for Healthcare Providers:  seriousbroker.it  This test is no t yet approved or cleared by the [place]  FDA and  has been authorized for detection  and/or diagnosis of SARS-CoV-2 by FDA under an Emergency Use Authorization (EUA). This EUA will remain  in effect (meaning this test can be used) for the duration of the COVID-19 declaration under Section 564(b)(1) of the Act, 21 U.S.C.section 360bbb-3(b)(1), unless the authorization is terminated  or revoked sooner.       Influenza A by PCR NEGATIVE NEGATIVE Final   Influenza B by PCR NEGATIVE NEGATIVE Final    Comment: (NOTE) The Xpert Xpress SARS-CoV-2/FLU/RSV plus assay is intended as an aid in the diagnosis of influenza from Nasopharyngeal swab specimens and should not be used as a sole basis for treatment. Nasal washings and aspirates are unacceptable for Xpert Xpress SARS-CoV-2/FLU/RSV testing.  Fact Sheet for Patients: bloggercourse.com  Fact Sheet for Healthcare Providers: seriousbroker.it  This test is not yet approved or cleared by the [place]  FDA and has been authorized for detection and/or diagnosis of SARS-CoV-2 by FDA under an Emergency Use Authorization (EUA). This EUA will remain in effect (meaning this test can be used) for the duration of the COVID-19 declaration under Section 564(b)(1) of the Act, 21 U.S.C. section 360bbb-3(b)(1), unless the authorization is terminated or revoked.     Resp Syncytial Virus by PCR POSITIVE (A) NEGATIVE Final    Comment: (NOTE) Fact Sheet for Patients: bloggercourse.com  Fact Sheet for Healthcare Providers: seriousbroker.it  This test is not yet approved or cleared by the [place]  FDA and has been authorized for detection and/or diagnosis of SARS-CoV-2 by FDA under an Emergency Use Authorization (EUA). This EUA will remain in effect (meaning this test can be used) for the duration of the COVID-19 declaration under Section 564(b)(1) of the Act, 21 U.S.C. section 360bbb-3(b)(1), unless the authorization is  terminated or revoked.  Performed at [facility], [address] [place], [place] [address]          Radiology Studies: DG Chest Portable 1 View Result Date: 06/10/2023 CLINICAL DATA:  PICC placement. EXAM: PORTABLE CHEST 1 VIEW COMPARISON:  Chest x-ray from yesterday. FINDINGS: New left upper extremity PICC line with tip in the distal SVC. Stable cardiomediastinal silhouette with mild cardiomegaly. No focal consolidation, pleural effusion, or pneumothorax. No acute osseous abnormality. IMPRESSION: 1. New left upper extremity PICC line with tip in the distal SVC. 2. No active disease. Electronically Signed   By: Elsie ONEIDA Shoulder M.D.   On: 06/10/2023 14:01        Scheduled Meds:  amitriptyline   50 mg Oral QHS   apixaban   5 mg Oral BID   azaTHIOprine   150 mg Oral Daily   Chlorhexidine  Gluconate Cloth  6 each  Topical Q0600   ezetimibe   10 mg Oral Daily   feeding supplement  1 Container Oral TID BM   hydrALAZINE   50 mg Oral TID   insulin  aspart  0-5 Units Subcutaneous QHS   insulin  aspart  0-9 Units Subcutaneous TID WC   levothyroxine   100 mcg Oral QAC breakfast   losartan   50 mg Oral Daily   mouth rinse  15 mL Mouth Rinse 4 times per day   predniSONE   10 mg Oral Daily   sertraline   50 mg Oral Daily   sodium chloride  flush  10-40 mL Intracatheter Q12H   Continuous Infusions:  amiodarone  30 mg/hr (06/12/23 0451)     LOS: 3 days    Time spent: 55 minutes    Racheal Mathurin D Maree, DO [facility]  If 7PM-7AM, please contact night-coverage [email or web address] 06/12/2023, 10:22 AM

## 2023-06-12 LAB — COMPREHENSIVE METABOLIC PANEL WITH GFR
ALT: 42 U/L (ref 0–44)
AST: 96 U/L — ABNORMAL HIGH (ref 15–41)
Albumin: 2.2 g/dL — ABNORMAL LOW (ref 3.5–5.0)
Alkaline Phosphatase: 70 U/L (ref 38–126)
Anion gap: 9 (ref 5–15)
BUN: 16 mg/dL (ref 8–23)
CO2: 26 mmol/L (ref 22–32)
Calcium: 7.6 mg/dL — ABNORMAL LOW (ref 8.9–10.3)
Chloride: 92 mmol/L — ABNORMAL LOW (ref 98–111)
Creatinine, Ser: 0.57 mg/dL (ref 0.44–1.00)
GFR, Estimated: >60 mL/min
Glucose, Bld: 230 mg/dL — ABNORMAL HIGH (ref 70–99)
Potassium: 3.7 mmol/L (ref 3.5–5.1)
Sodium: 127 mmol/L — ABNORMAL LOW (ref 135–145)
Total Bilirubin: 1.4 mg/dL — ABNORMAL HIGH (ref 0.0–1.2)
Total Protein: 5.1 g/dL — ABNORMAL LOW (ref 6.5–8.1)

## 2023-06-12 LAB — CBC
HCT: 26.7 % — ABNORMAL LOW (ref 36.0–46.0)
Hemoglobin: 9 g/dL — ABNORMAL LOW (ref 12.0–15.0)
MCH: 37.5 pg — ABNORMAL HIGH (ref 26.0–34.0)
MCHC: 33.7 g/dL (ref 30.0–36.0)
MCV: 111.3 fL — ABNORMAL HIGH (ref 80.0–100.0)
Platelets: 123 10*3/uL — ABNORMAL LOW (ref 150–400)
RBC: 2.4 MIL/uL — ABNORMAL LOW (ref 3.87–5.11)
RDW: 20.3 % — ABNORMAL HIGH (ref 11.5–15.5)
WBC: 1.8 10*3/uL — ABNORMAL LOW (ref 4.0–10.5)
nRBC: 0 % (ref 0.0–0.2)

## 2023-06-12 LAB — SODIUM, URINE, RANDOM: Sodium, Ur: 13 mmol/L

## 2023-06-12 LAB — GLUCOSE, CAPILLARY
Glucose-Capillary: 132 mg/dL — ABNORMAL HIGH (ref 70–99)
Glucose-Capillary: 136 mg/dL — ABNORMAL HIGH (ref 70–99)
Glucose-Capillary: 175 mg/dL — ABNORMAL HIGH (ref 70–99)
Glucose-Capillary: 221 mg/dL — ABNORMAL HIGH (ref 70–99)

## 2023-06-12 LAB — MAGNESIUM: Magnesium: 1.7 mg/dL (ref 1.7–2.4)

## 2023-06-12 LAB — T4, FREE: Free T4: 1.33 ng/dL — ABNORMAL HIGH (ref 0.61–1.12)

## 2023-06-12 LAB — OSMOLALITY: Osmolality: 291 mosm/kg (ref 275–295)

## 2023-06-12 LAB — OSMOLALITY, URINE: Osmolality, Ur: 939 mosm/kg — ABNORMAL HIGH (ref 300–900)

## 2023-06-12 MED ORDER — ORAL CARE MOUTH RINSE: 15.0000 mL | OROMUCOSAL | Status: DC | PRN

## 2023-06-12 MED ORDER — ORAL CARE MOUTH RINSE
15.0000 mL | OROMUCOSAL | Status: DC
  Administered 2023-06-12 – 2023-06-19 (×31): 15 mL via OROMUCOSAL

## 2023-06-13 DIAGNOSIS — I4892 Unspecified atrial flutter: Secondary | ICD-10-CM | POA: Diagnosis not present

## 2023-06-13 DIAGNOSIS — I4891 Unspecified atrial fibrillation: Secondary | ICD-10-CM | POA: Diagnosis not present

## 2023-06-13 LAB — CBC
HCT: 28.8 % — ABNORMAL LOW (ref 36.0–46.0)
Hemoglobin: 9.6 g/dL — ABNORMAL LOW (ref 12.0–15.0)
MCH: 37.1 pg — ABNORMAL HIGH (ref 26.0–34.0)
MCHC: 33.3 g/dL (ref 30.0–36.0)
MCV: 111.2 fL — ABNORMAL HIGH (ref 80.0–100.0)
Platelets: 154 10*3/uL (ref 150–400)
RBC: 2.59 MIL/uL — ABNORMAL LOW (ref 3.87–5.11)
RDW: 20.7 % — ABNORMAL HIGH (ref 11.5–15.5)
WBC: 2 10*3/uL — ABNORMAL LOW (ref 4.0–10.5)
nRBC: 0 % (ref 0.0–0.2)

## 2023-06-13 LAB — COMPREHENSIVE METABOLIC PANEL
ALT: 41 U/L (ref 0–44)
AST: 94 U/L — ABNORMAL HIGH (ref 15–41)
Albumin: 2.3 g/dL — ABNORMAL LOW (ref 3.5–5.0)
Alkaline Phosphatase: 87 U/L (ref 38–126)
Anion gap: 11 (ref 5–15)
BUN: 15 mg/dL (ref 8–23)
CO2: 23 mmol/L (ref 22–32)
Calcium: 7.5 mg/dL — ABNORMAL LOW (ref 8.9–10.3)
Chloride: 93 mmol/L — ABNORMAL LOW (ref 98–111)
Creatinine, Ser: 0.6 mg/dL (ref 0.44–1.00)
GFR, Estimated: >60 mL/min (ref >60)
Glucose, Bld: 222 mg/dL — ABNORMAL HIGH (ref 70–99)
Potassium: 3.5 mmol/L (ref 3.5–5.1)
Sodium: 127 mmol/L — ABNORMAL LOW (ref 135–145)
Total Bilirubin: 1.3 mg/dL — ABNORMAL HIGH (ref 0.0–1.2)
Total Protein: 5.2 g/dL — ABNORMAL LOW (ref 6.5–8.1)

## 2023-06-13 LAB — GLUCOSE, CAPILLARY
Glucose-Capillary: 155 mg/dL — ABNORMAL HIGH (ref 70–99)
Glucose-Capillary: 181 mg/dL — ABNORMAL HIGH (ref 70–99)
Glucose-Capillary: 248 mg/dL — ABNORMAL HIGH (ref 70–99)
Glucose-Capillary: 147 mg/dL — ABNORMAL HIGH (ref 70–99)

## 2023-06-13 LAB — MAGNESIUM: Magnesium: 1.8 mg/dL (ref 1.7–2.4)

## 2023-06-13 MED ORDER — HYDROCODONE BIT-HOMATROP MBR 5-1.5 MG/5ML PO SOLN
5.0000 mL | ORAL | Status: DC | PRN
  Administered 2023-06-13 – 2023-06-17 (×6): 5 mL via ORAL
  Filled 2023-06-13 (×6): qty 5

## 2023-06-13 NOTE — Progress Notes (Addendum)
 Progress Note  Patient Name: [name] Date of Encounter: 06/13/2023  Primary Cardiologist: Alvan Carrier, MD  Interval Summary   Chart reviewed including follow-up by Dr. Alvan last week.  Patient remains on IV amiodarone , heart rate 120s in atrial fibrillation this morning.  She is asymptomatic in terms of palpitations or chest pain.  Still coughing with chest congestion.  Vital Signs    Vitals:   06/13/23 0815 06/13/23 0820 06/13/23 0830 06/13/23 0848  BP: (!) 157/133  (!) 163/145 139/76  Pulse: (!) 126 (!) 141 (!) 130   Resp: (!) 22 (!) 23 (!) 21 (!) 26  Temp:      TempSrc:      SpO2: 100% 98% 97%   Weight:      Height:        Intake/Output Summary (Last 24 hours) at 06/13/2023 0931 Last data filed at 06/12/2023 1810 Gross per 24 hour  Intake 444.19 ml  Output --  Net 444.19 ml   Filed Weights   06/12/23 0530  Weight: 80.5 kg    Physical Exam   GEN: No acute distress.   Neck: No JVD. Cardiac: Irregularly irregular without gallop.  Respiratory: Prolonged expiratory phase with wheeze and scattered rhonchi anteriorly. GI: Soft, nontender, bowel sounds present. MS: No edema.  ECG/Telemetry    Telemetry reviewed showing atrial fibrillation with RVR.  Labs    Chemistry Recent Labs  Lab 06/11/23 0445 06/12/23 0515 06/13/23 0300  NA 128* 127* 127*  K 3.8 3.7 3.5  CL 93* 92* 93*  CO2 26 26 23   GLUCOSE 137* 230* 222*  BUN 14 16 15   CREATININE 0.67 0.57 0.60  CALCIUM  8.1* 7.6* 7.5*  PROT 5.6* 5.1* 5.2*  ALBUMIN 2.5* 2.2* 2.3*  AST 108* 96* 94*  ALT 47* 42 41  ALKPHOS 74 70 87  BILITOT 1.3* 1.4* 1.3*  GFRNONAA >60 >60 >60  ANIONGAP 9 9 11     Hematology Recent Labs  Lab 06/11/23 0445 06/12/23 0515 06/13/23 0300  WBC 1.7* 1.8* 2.0*  RBC 2.64* 2.40* 2.59*  HGB 9.7* 9.0* 9.6*  HCT 29.6* 26.7* 28.8*  MCV 112.1* 111.3* 111.2*  MCH 36.7* 37.5* 37.1*  MCHC 32.8 33.7 33.3  RDW 20.0* 20.3* 20.7*  PLT 153 123* 154   Cardiac  Enzymes Recent Labs  Lab 06/09/23 1322 06/09/23 1601  TROPONINIHS 43* 37*   Lipid Panel     Component Value Date/Time   CHOL 116 07/25/2012 0815   TRIG 96 07/25/2012 0815   HDL 38 (L) 07/25/2012 0815   CHOLHDL 3.1 07/25/2012 0815   VLDL 19 07/25/2012 0815   LDLCALC 59 07/25/2012 0815    Assessment & Plan   1.  Paroxysmal to persistent atrial fibrillation with RVR.  CHA2DS2-VASc score is 7.  Currently on IV amiodarone  along with Eliquis .  Holding off on standing AV nodal blockers in light of prior junctional bradycardia following cardioversion on prior admission.  2.  Paroxysmal wide-complex tachycardia, likely NSVT.  3.  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  4.  CAD s/p BMS to the circumflex in 2003 as well as DES to the circumflex, OM 3, and RCA in 2006.  No obvious angina.  5.  Active RSV infection.  Has COPD at baseline.  6.  Hypothyroidism on Synthroid .  TSH 0.461.  Would continue IV amiodarone  and Eliquis .  Holding off on electrical cardioversion given high risk of recurrence in the short-term particularly in light of her present  pulmonary status and RSV.  Continue with as needed IV Lopressor .  She is also on Zetia , Cozaar , and hydralazine .  For questions or updates, please contact [facility] Please consult [email or web address] for contact info under   Signed, Jayson Sierras, MD  06/13/2023, 9:31 AM

## 2023-06-13 NOTE — Plan of Care (Signed)
  Problem: Education: Goal: Knowledge of General Education information will improve Description: Including pain rating scale, medication(s)/side effects and non-pharmacologic comfort measures Outcome: Not Progressing   Problem: Clinical Measurements: Goal: Ability to maintain clinical measurements within normal limits will improve Outcome: Not Progressing   Problem: Activity: Goal: Risk for activity intolerance will decrease Outcome: Not Progressing   

## 2023-06-13 NOTE — Progress Notes (Signed)
 PROGRESS NOTE    [name]  FMW:[id number] DOB: Apr 22, 1946 DOA: 06/09/2023 PCP: Dawayne Kerney SQUIBB, MD   Brief Narrative:    [name] is a 78 y.o. female with medical history significant for COPD, hypertension, dyslipidemia, hypothyroidism, CAD status post stent placement, GERD, type 2 diabetes, blindness, and obesity.  She is wheelchair dependent for the last year and patient is a resident at ALF.  She was brought in from her facility due to dyspnea and also appears to have had trouble with nausea and vomiting for the last 2-3 days.  She was admitted for control of wide-complex tachyarrhythmia in the setting of PAF and remains on IV amiodarone  infusion.  She is now noted to have RSV infection as well.  Assessment & Plan:   Principal Problem:   Atrial flutter with rapid ventricular response (HCC) Active Problems:   Hypothyroidism   Obesity, Class II, BMI 35-39.9   Essential hypertension   Coronary artery disease   Gastroesophageal reflux disease   DM type 2 (diabetes mellitus, type 2) (HCC)   Acute diastolic CHF (congestive heart failure) (HCC)   Autoimmune hepatitis (HCC)   Paroxysmal atrial fibrillation with RVR (HCC)  Assessment and Plan:   Wide-complex tachyarrhythmia in the setting of PAF -Continue IV amiodarone  per cardiology -Plan for 2D echocardiogram once heart rate controlled -Poor candidate for ischemic testing -On Eliquis  for anticoagulation  Cough secondary to RSV infection -Continue isolation precaution -Stronger cough medication prescribed   Hyponatremia-stable -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   Consultants:  Cardiology  Procedures:  None  Antimicrobials:  None   Subjective: Patient seen and evaluated today and overall appears to be doing well, but continues to have elevated heart rates.  Continues to have a cough as well.  Objective: Vitals:   06/13/23 0815 06/13/23 0820 06/13/23 0830 06/13/23 0848  BP: (!) 157/133  (!) 163/145 139/76  Pulse: (!) 126 (!) 141 (!) 130   Resp: (!) 22 (!) 23 (!) 21 (!) 26  Temp:      TempSrc:      SpO2: 100% 98% 97%   Weight:      Height:        Intake/Output Summary (Last 24 hours) at 06/13/2023 1127 Last data filed at 06/12/2023 1810 Gross per 24 hour  Intake 352.47 ml  Output --  Net 352.47 ml   Filed Weights   06/12/23 0530  Weight: 80.5 kg    Examination:  General exam: Appears calm and comfortable  Respiratory system: Clear to auscultation. Respiratory effort normal.  Nasal cannula oxygen  Cardiovascular system: S1 & S2 heard, irregular and tachycardic Gastrointestinal system: Abdomen is  soft Central nervous system: Alert and awake Extremities: No edema Skin: No significant lesions noted Psychiatry: Flat affect.    Data Reviewed: I have personally reviewed following labs and imaging studies  CBC: Recent Labs  Lab 06/09/23 1322 06/10/23 0432 06/11/23 0445 06/12/23 0515 06/13/23 0300  WBC 2.1* 1.7* 1.7* 1.8*  2.0*  NEUTROABS 1.7  --   --   --   --   HGB 10.2* 10.4* 9.7* 9.0* 9.6*  HCT 31.9* 31.8* 29.6* 26.7* 28.8*  MCV 113.9* 114.0* 112.1* 111.3* 111.2*  PLT 155 163 153 123* 154   Basic Metabolic Panel: Recent Labs  Lab 06/09/23 1322 06/09/23 1601 06/10/23 0432 06/11/23 0445 06/12/23 0515 06/13/23 0300  NA 129*  --  129* 128* 127* 127*  K 5.2*  --  4.1 3.8 3.7 3.5  CL 93*  --  95* 93* 92* 93*  CO2 21*  --  25 26 26 23   GLUCOSE 155*  --  122* 137* 230* 222*  BUN 16  --  15 14 16 15   CREATININE 0.89  --  0.64 0.67 0.57 0.60  CALCIUM  8.6*  --  8.6* 8.1* 7.6* 7.5*  MG  --  1.8 1.7 1.6* 1.7 1.8   GFR: Estimated Creatinine Clearance: 58.4 mL/min (by C-G formula based on SCr of 0.6 mg/dL). Liver Function Tests: Recent Labs  Lab 06/09/23 1322 06/10/23 0432 06/11/23 0445 06/12/23 0515 06/13/23 0300  AST 118* 101* 108* 96* 94*  ALT 54* 49* 47* 42 41  ALKPHOS 81 80 74 70 87  BILITOT 2.0* 1.4* 1.3* 1.4* 1.3*  PROT 6.2* 6.1* 5.6* 5.1* 5.2*  ALBUMIN 3.0* 2.8* 2.5* 2.2* 2.3*   No results for input(s): LIPASE, AMYLASE in the last 168 hours. No results for input(s): AMMONIA in the last 168 hours. Coagulation Profile: No results for input(s): INR, PROTIME in the last 168 hours. Cardiac Enzymes: No results for input(s): CKTOTAL, CKMB, CKMBINDEX, TROPONINI in the last 168 hours. BNP (last 3 results) No results for input(s): PROBNP in the last 8760 hours. HbA1C: No results for input(s): HGBA1C in the last 72 hours. CBG: Recent Labs  Lab 06/12/23 1118 06/12/23 1521 06/12/23 2111 06/13/23 0757 06/13/23 1105  GLUCAP 136* 175* 221* 155* 181*   Lipid Profile: No results for input(s): CHOL, HDL, LDLCALC, TRIG, CHOLHDL, LDLDIRECT in the last 72 hours. Thyroid  Function Tests: Recent Labs    06/12/23 0815  FREET4 1.33*   Anemia Panel: No results for input(s): VITAMINB12, FOLATE, FERRITIN, TIBC, IRON , RETICCTPCT in the last 72  hours. Sepsis Labs: No results for input(s): PROCALCITON, LATICACIDVEN in the last 168 hours.  Recent Results (from the past 240 hours)  MRSA Next Gen by PCR, Nasal     Status: None   Collection Time: 06/09/23  2:22 PM   Specimen: Nasal Mucosa; Nasal Swab  Result Value Ref Range Status   MRSA by PCR Next Gen NOT DETECTED NOT DETECTED Final    Comment: (NOTE) The GeneXpert MRSA Assay (FDA approved for NASAL specimens only), is one component of a comprehensive MRSA colonization surveillance program. It is not intended to diagnose MRSA infection nor to guide or monitor treatment for MRSA infections. Test performance is not FDA approved in patients less than 19 years old. Performed at [facility], [address] [place], [place] [address]   Resp panel by RT-PCR (RSV, Flu A&B, Covid) Anterior Nasal Swab     Status: Abnormal   Collection Time: 06/10/23  9:33 AM   Specimen: Anterior  Nasal Swab  Result Value Ref Range Status   SARS Coronavirus 2 by RT PCR NEGATIVE NEGATIVE Final    Comment: (NOTE) SARS-CoV-2 target nucleic acids are NOT DETECTED.  The SARS-CoV-2 RNA is generally detectable in upper respiratory specimens during the acute phase of infection. The lowest concentration of SARS-CoV-2 viral copies this assay can detect is 138 copies/mL. A negative result does not preclude SARS-Cov-2 infection and should not be used as the sole basis for treatment or other patient management decisions. A negative result may occur with  improper specimen collection/handling, submission of specimen other than nasopharyngeal swab, presence of viral mutation(s) within the areas targeted by this assay, and inadequate number of viral copies(<138 copies/mL). A negative result must be combined with clinical observations, patient history, and epidemiological information. The expected result is Negative.  Fact Sheet for Patients:  bloggercourse.com  Fact Sheet for  Healthcare Providers:  seriousbroker.it  This test is no t yet approved or cleared by the [place]  FDA and  has been authorized for detection and/or diagnosis of SARS-CoV-2 by FDA under an Emergency Use Authorization (EUA). This EUA will remain  in effect (meaning this test can be used) for the duration of the COVID-19 declaration under Section 564(b)(1) of the Act, 21 U.S.C.section 360bbb-3(b)(1), unless the authorization is terminated  or revoked sooner.       Influenza A by PCR NEGATIVE NEGATIVE Final   Influenza B by PCR NEGATIVE NEGATIVE Final    Comment: (NOTE) The Xpert Xpress SARS-CoV-2/FLU/RSV plus assay is intended as an aid in the diagnosis of influenza from Nasopharyngeal swab specimens and should not be used as a sole basis for treatment. Nasal washings and aspirates are unacceptable for Xpert Xpress SARS-CoV-2/FLU/RSV testing.  Fact Sheet for Patients: bloggercourse.com  Fact Sheet for Healthcare Providers: seriousbroker.it  This test is not yet approved or cleared by the [place]  FDA and has been authorized for detection and/or diagnosis of SARS-CoV-2 by FDA under an Emergency Use Authorization (EUA). This EUA will remain in effect (meaning this test can be used) for the duration of the COVID-19 declaration under Section 564(b)(1) of the Act, 21 U.S.C. section 360bbb-3(b)(1), unless the authorization is terminated or revoked.     Resp Syncytial Virus by PCR POSITIVE (A) NEGATIVE Final    Comment: (NOTE) Fact Sheet for Patients: bloggercourse.com  Fact Sheet for Healthcare Providers: seriousbroker.it  This test is not yet approved or cleared by the [place]  FDA and has been authorized for detection and/or diagnosis of SARS-CoV-2 by FDA under an Emergency Use Authorization (EUA). This EUA will remain in effect (meaning  this test can be used) for the duration of the COVID-19 declaration under Section 564(b)(1) of the Act, 21 U.S.C. section 360bbb-3(b)(1), unless the authorization is terminated or revoked.  Performed at [facility], [address], [place], [place] [address]          Radiology Studies: No results found.       Scheduled Meds:  amitriptyline   50 mg Oral QHS   apixaban   5 mg Oral BID   azaTHIOprine   150 mg Oral Daily   Chlorhexidine  Gluconate Cloth  6 each Topical Q0600   ezetimibe   10 mg Oral Daily   feeding supplement  1 Container Oral TID BM   hydrALAZINE   50 mg Oral TID   insulin  aspart  0-5 Units Subcutaneous QHS   insulin  aspart  0-9 Units Subcutaneous TID WC   levothyroxine   100 mcg Oral QAC breakfast  losartan   50 mg Oral Daily   mouth rinse  15 mL Mouth Rinse 4 times per day   predniSONE   10 mg Oral Daily   sertraline   50 mg Oral Daily   sodium chloride  flush  10-40 mL Intracatheter Q12H   Continuous Infusions:  amiodarone  30 mg/hr (06/13/23 0317)     LOS: 4 days    Time spent: 55 minutes    Nickoli Bagheri D Maree, DO [facility]  If 7PM-7AM, please contact night-coverage [email or web address] 06/13/2023, 11:27 AM

## 2023-06-13 NOTE — ED Provider Notes (Signed)
 [facility] Provider Note   CSN: [id number] Arrival date & time: 06/09/23  1147     History  Chief Complaint  Patient presents with   Cough    [name] is a 78 y.o. female.  Patient has a history of hypertension and COPD and diabetes.  She presents with palpitations.  The history is provided by the patient and the nursing home.  Palpitations Palpitations quality:  Regular Onset quality:  Sudden Timing:  Constant Progression:  Worsening Chronicity:  Recurrent Relieved by:  Nothing Worsened by:  Nothing Associated symptoms: no back pain, no chest pain and no cough        Home Medications Prior to Admission medications   Medication Sig Start Date End Date Taking? Authorizing Provider  albuterol  (VENTOLIN  HFA) 108 (90 Base) MCG/ACT inhaler Inhale 2 puffs into the lungs every 6 (six) hours as needed for wheezing or shortness of breath. 07/02/21  Yes Ricky Fines, MD  amiodarone  (PACERONE ) 200 MG tablet Take 1 tablet (200 mg total) by mouth daily. 04/29/23  Yes Tat, Alm, MD  amitriptyline  (ELAVIL ) 50 MG tablet Take 50 mg by mouth at bedtime.   Yes [provider]  apixaban  (ELIQUIS ) 5 MG TABS tablet Take 1 tablet (5 mg total) by mouth 2 (two) times daily. 09/27/22 06/09/23 Yes Shahmehdi, Adriana LABOR, MD  azaTHIOprine  (IMURAN ) 50 MG tablet Take 3 tablets (150 mg total) by mouth daily. 04/28/22  Yes Carlan, Chelsea L, NP  bisacodyl  (DULCOLAX) 5 MG EC tablet Take 5 mg by mouth in the morning and at bedtime. 06/07/23 07/07/23 Yes [provider]  calcium  carbonate (TUMS - DOSED IN MG ELEMENTAL CALCIUM ) 500 MG chewable tablet Chew 1,000 mg by mouth every 8 (eight) hours as needed for heartburn.   Yes [provider]  Cholecalciferol  50 MCG (2000 UT) TABS Take 2,000 Units by mouth daily at 6 (six) AM.   Yes [provider]  ezetimibe  (ZETIA ) 10 MG tablet Take 1 tablet (10 mg total) by mouth daily. 12/29/21  Yes Branch, Dorn FALCON, MD   guaifenesin  (ROBITUSSIN) 100 MG/5ML syrup Take 200 mg by mouth every 6 (six) hours as needed for cough. Do not exceed 4 doses in 24 hours   Yes [provider]  hydrALAZINE  (APRESOLINE ) 50 MG tablet Take 1 tablet (50 mg total) by mouth every 8 (eight) hours. 04/29/23  Yes Tat, Alm, MD  insulin  lispro (HUMALOG) 100 UNIT/ML KwikPen Inject 2-10 Units into the skin 3 (three) times daily before meals. CBG < 121 = 0 units, 121-150 = 2 units, 151-200 = 3 units, 201-250 = 5 units, 251-300 = 8 units, 301-400 = 15 units.   Yes [provider]  levothyroxine  (SYNTHROID ) 100 MCG tablet Take 1 tablet (100 mcg total) by mouth daily before breakfast. 03/02/23  Yes Johnson, Clanford L, MD  lidocaine  (LIDODERM ) 5 % Place 1 patch onto the skin daily. On for 12 hours, off for 12 hours. 06/09/21  Yes [provider]  loperamide  (IMODIUM  A-D) 2 MG tablet Take 4 mg by mouth as needed for diarrhea or loose stools.   Yes [provider]  loratadine  (CLARITIN ) 10 MG tablet Take 10 mg by mouth daily.   Yes [provider]  losartan  (COZAAR ) 50 MG tablet Take 1 tablet (50 mg total) by mouth daily. 04/29/23  Yes Tat, Alm, MD  Magnesium  Hydroxide (MILK OF MAGNESIA PO) Take 30 mLs by mouth 2 (two) times daily as needed (constipation).  Yes [provider]  magnesium  oxide (MAG-OX) 400 (240 Mg) MG tablet Take 1 tablet (400 mg total) by mouth daily. 04/28/23  Yes Tat, Alm, MD  melatonin 5 MG TABS Take 5 mg by mouth at bedtime.   Yes [provider]  nitrofurantoin, macrocrystal-monohydrate, (MACROBID) 100 MG capsule Take 100 mg by mouth 2 (two) times daily.  06/14/23 Yes [provider]  NYSTATIN  powder Apply 1 Application topically 3 (three) times daily as needed (redness under abdominal folds and breasts). 08/07/22  Yes [provider]  ondansetron  (ZOFRAN ) 4 MG tablet Take 4 mg by mouth every 8 (eight) hours as needed for nausea or vomiting.  05/10/23  Yes [provider]  Phenylephrine -Mineral Oil-Pet (HEMORRHOIDAL) 0.25-14-71.9 % OINT Place 1 application rectally every 6 (six) hours as needed (hemorrhoid discomfort).   Yes [provider]  polyethylene glycol (MIRALAX  / GLYCOLAX ) 17 g packet Take 17 g by mouth 2 (two) times daily.   Yes [provider]  potassium chloride  (MICRO-K ) 10 MEQ CR capsule Take 20 mEq by mouth 2 (two) times daily. 11/25/22  Yes [provider]  predniSONE  (DELTASONE ) 10 MG tablet Take 10 mg by mouth daily. 12/28/22  Yes [provider]  senna (SENOKOT) 8.6 MG TABS tablet Take 1 tablet by mouth in the morning and at bedtime.   Yes [provider]  sertraline  (ZOLOFT ) 50 MG tablet Take 50 mg by mouth daily. 06/09/21  Yes [provider]      Allergies    Codeine    Review of Systems   Review of Systems  Constitutional:  Negative for appetite change and fatigue.  HENT:  Negative for congestion, ear discharge and sinus pressure.   Eyes:  Negative for discharge.  Respiratory:  Negative for cough.   Cardiovascular:  Positive for palpitations. Negative for chest pain.  Gastrointestinal:  Negative for abdominal pain and diarrhea.  Genitourinary:  Negative for frequency and hematuria.  Musculoskeletal:  Negative for back pain.  Skin:  Negative for rash.  Neurological:  Negative for seizures and headaches.  Psychiatric/Behavioral:  Negative for hallucinations.     Physical Exam Updated Vital Signs BP 139/76   Pulse (!) 130   Temp 98.4 F (36.9 C) (Oral)   Resp (!) 26   Ht 5' 1 (1.549 m)   Wt 80.5 kg   SpO2 97%   BMI 33.53 kg/m  Physical Exam Vitals and nursing note reviewed.  Constitutional:      Appearance: She is well-developed.  HENT:     Head: Normocephalic.     Nose: Nose normal.  Eyes:     General: No scleral icterus.    Conjunctiva/sclera: Conjunctivae normal.  Neck:     Thyroid : No thyromegaly.  Cardiovascular:      Rate and Rhythm: Regular rhythm. Tachycardia present.     Heart sounds: No murmur heard.    No friction rub. No gallop.  Pulmonary:     Breath sounds: No stridor. No wheezing or rales.  Chest:     Chest wall: No tenderness.  Abdominal:     General: There is no distension.     Tenderness: There is no abdominal tenderness. There is no rebound.  Musculoskeletal:        General: Normal range of motion.     Cervical back: Neck supple.  Lymphadenopathy:     Cervical: No cervical adenopathy.  Skin:    Findings: No erythema or rash.  Neurological:     Mental  Status: She is alert and oriented to person, place, and time.     Motor: No abnormal muscle tone.     Coordination: Coordination normal.  Psychiatric:        Behavior: Behavior normal.     ED Results / Procedures / Treatments   Labs (all labs ordered are listed, but only abnormal results are displayed) Labs Reviewed  RESP PANEL BY RT-PCR (RSV, FLU A&B, COVID)  RVPGX2 - Abnormal; Notable for the following components:      Result Value   Resp Syncytial Virus by PCR POSITIVE (*)    All other components within normal limits  CBC WITH DIFFERENTIAL/PLATELET - Abnormal; Notable for the following components:   WBC 2.1 (*)    RBC 2.80 (*)    Hemoglobin 10.2 (*)    HCT 31.9 (*)    MCV 113.9 (*)    MCH 36.4 (*)    RDW 19.9 (*)    Lymphs Abs 0.2 (*)    All other components within normal limits  COMPREHENSIVE METABOLIC PANEL - Abnormal; Notable for the following components:   Sodium 129 (*)    Potassium 5.2 (*)    Chloride 93 (*)    CO2 21 (*)    Glucose, Bld 155 (*)    Calcium  8.6 (*)    Total Protein 6.2 (*)    Albumin 3.0 (*)    AST 118 (*)    ALT 54 (*)    Total Bilirubin 2.0 (*)    All other components within normal limits  BRAIN NATRIURETIC PEPTIDE - Abnormal; Notable for the following components:   B Natriuretic Peptide 368.0 (*)    All other components within normal limits  GLUCOSE, CAPILLARY - Abnormal; Notable for  the following components:   Glucose-Capillary 182 (*)    All other components within normal limits  COMPREHENSIVE METABOLIC PANEL - Abnormal; Notable for the following components:   Sodium 129 (*)    Chloride 95 (*)    Glucose, Bld 122 (*)    Calcium  8.6 (*)    Total Protein 6.1 (*)    Albumin 2.8 (*)    AST 101 (*)    ALT 49 (*)    Total Bilirubin 1.4 (*)    All other components within normal limits  CBC - Abnormal; Notable for the following components:   WBC 1.7 (*)    RBC 2.79 (*)    Hemoglobin 10.4 (*)    HCT 31.8 (*)    MCV 114.0 (*)    MCH 37.3 (*)    RDW 20.0 (*)    All other components within normal limits  GLUCOSE, CAPILLARY - Abnormal; Notable for the following components:   Glucose-Capillary 172 (*)    All other components within normal limits  GLUCOSE, CAPILLARY - Abnormal; Notable for the following components:   Glucose-Capillary 102 (*)    All other components within normal limits  GLUCOSE, CAPILLARY - Abnormal; Notable for the following components:   Glucose-Capillary 123 (*)    All other components within normal limits  GLUCOSE, CAPILLARY - Abnormal; Notable for the following components:   Glucose-Capillary 152 (*)    All other components within normal limits  GLUCOSE, CAPILLARY - Abnormal; Notable for the following components:   Glucose-Capillary 173 (*)    All other components within normal limits  GLUCOSE, CAPILLARY - Abnormal; Notable for the following components:   Glucose-Capillary 240 (*)    All other components within normal limits  COMPREHENSIVE METABOLIC PANEL -  Abnormal; Notable for the following components:   Sodium 128 (*)    Chloride 93 (*)    Glucose, Bld 137 (*)    Calcium  8.1 (*)    Total Protein 5.6 (*)    Albumin 2.5 (*)    AST 108 (*)    ALT 47 (*)    Total Bilirubin 1.3 (*)    All other components within normal limits  MAGNESIUM  - Abnormal; Notable for the following components:   Magnesium  1.6 (*)    All other components  within normal limits  CBC - Abnormal; Notable for the following components:   WBC 1.7 (*)    RBC 2.64 (*)    Hemoglobin 9.7 (*)    HCT 29.6 (*)    MCV 112.1 (*)    MCH 36.7 (*)    RDW 20.0 (*)    All other components within normal limits  GLUCOSE, CAPILLARY - Abnormal; Notable for the following components:   Glucose-Capillary 171 (*)    All other components within normal limits  GLUCOSE, CAPILLARY - Abnormal; Notable for the following components:   Glucose-Capillary 163 (*)    All other components within normal limits  GLUCOSE, CAPILLARY - Abnormal; Notable for the following components:   Glucose-Capillary 181 (*)    All other components within normal limits  GLUCOSE, CAPILLARY - Abnormal; Notable for the following components:   Glucose-Capillary 191 (*)    All other components within normal limits  COMPREHENSIVE METABOLIC PANEL - Abnormal; Notable for the following components:   Sodium 127 (*)    Chloride 92 (*)    Glucose, Bld 230 (*)    Calcium  7.6 (*)    Total Protein 5.1 (*)    Albumin 2.2 (*)    AST 96 (*)    Total Bilirubin 1.4 (*)    All other components within normal limits  CBC - Abnormal; Notable for the following components:   WBC 1.8 (*)    RBC 2.40 (*)    Hemoglobin 9.0 (*)    HCT 26.7 (*)    MCV 111.3 (*)    MCH 37.5 (*)    RDW 20.3 (*)    Platelets 123 (*)    All other components within normal limits  GLUCOSE, CAPILLARY - Abnormal; Notable for the following components:   Glucose-Capillary 238 (*)    All other components within normal limits  GLUCOSE, CAPILLARY - Abnormal; Notable for the following components:   Glucose-Capillary 132 (*)    All other components within normal limits  T4, FREE - Abnormal; Notable for the following components:   Free T4 1.33 (*)    All other components within normal limits  OSMOLALITY, URINE - Abnormal; Notable for the following components:   Osmolality, Ur 939 (*)    All other components within normal limits   GLUCOSE, CAPILLARY - Abnormal; Notable for the following components:   Glucose-Capillary 136 (*)    All other components within normal limits  GLUCOSE, CAPILLARY - Abnormal; Notable for the following components:   Glucose-Capillary 175 (*)    All other components within normal limits  COMPREHENSIVE METABOLIC PANEL - Abnormal; Notable for the following components:   Sodium 127 (*)    Chloride 93 (*)    Glucose, Bld 222 (*)    Calcium  7.5 (*)    Total Protein 5.2 (*)    Albumin 2.3 (*)    AST 94 (*)    Total Bilirubin 1.3 (*)    All other components within  normal limits  CBC - Abnormal; Notable for the following components:   WBC 2.0 (*)    RBC 2.59 (*)    Hemoglobin 9.6 (*)    HCT 28.8 (*)    MCV 111.2 (*)    MCH 37.1 (*)    RDW 20.7 (*)    All other components within normal limits  GLUCOSE, CAPILLARY - Abnormal; Notable for the following components:   Glucose-Capillary 221 (*)    All other components within normal limits  GLUCOSE, CAPILLARY - Abnormal; Notable for the following components:   Glucose-Capillary 155 (*)    All other components within normal limits  TROPONIN I (HIGH SENSITIVITY) - Abnormal; Notable for the following components:   Troponin I (High Sensitivity) 43 (*)    All other components within normal limits  TROPONIN I (HIGH SENSITIVITY) - Abnormal; Notable for the following components:   Troponin I (High Sensitivity) 37 (*)    All other components within normal limits  MRSA NEXT GEN BY PCR, NASAL  RESP PANEL BY RT-PCR (RSV, FLU A&B, COVID)  RVPGX2  MAGNESIUM   TSH  MAGNESIUM   MAGNESIUM   OSMOLALITY  SODIUM, URINE, RANDOM  MAGNESIUM     EKG EKG Interpretation Date/Time:  Thursday June 09 2023 13:38:24 EST Ventricular Rate:  155 PR Interval:  127 QRS Duration:  153 QT Interval:  364 QTC Calculation: 585 R Axis:   166  Text Interpretation: Compared to prior traciing, continued A Fib with widened QRS complex Confirmed by Cottie Cough [phone])  on 06/10/2023 7:20:45 AM  Radiology No results found.  Procedures Procedures    Medications Ordered in ED Medications  amiodarone  (NEXTERONE  PREMIX) 360-4.14 MG/200ML-% (1.8 mg/mL) IV infusion (0 mg/hr Intravenous Stopped 06/09/23 2000)  amiodarone  (NEXTERONE  PREMIX) 360-4.14 MG/200ML-% (1.8 mg/mL) IV infusion (30 mg/hr Intravenous New Bag/Given 06/13/23 0317)  Chlorhexidine  Gluconate Cloth 2 % PADS 6 each (6 each Topical Given 06/13/23 0529)  0.9 %  sodium chloride  infusion (0 mLs Intravenous Stopped 06/10/23 0300)  acetaminophen  (TYLENOL ) tablet 650 mg (has no administration in time range)    Or  acetaminophen  (TYLENOL ) suppository 650 mg (has no administration in time range)  ondansetron  (ZOFRAN ) tablet 4 mg (has no administration in time range)    Or  ondansetron  (ZOFRAN ) injection 4 mg (has no administration in time range)  levalbuterol  (XOPENEX ) nebulizer solution 0.63 mg (has no administration in time range)  feeding supplement (BOOST / RESOURCE BREEZE) liquid 1 Container (1 Container Oral Patient Refused/Not Given 06/13/23 0851)  metoprolol  tartrate (LOPRESSOR ) injection 5 mg (5 mg Intravenous Given 06/13/23 0143)  apixaban  (ELIQUIS ) tablet 5 mg (5 mg Oral Given 06/13/23 0841)  losartan  (COZAAR ) tablet 50 mg (50 mg Oral Given 06/13/23 0850)  hydrALAZINE  (APRESOLINE ) tablet 50 mg (50 mg Oral Given 06/13/23 0841)  amitriptyline  (ELAVIL ) tablet 50 mg (50 mg Oral Given 06/12/23 2119)  ezetimibe  (ZETIA ) tablet 10 mg (10 mg Oral Given 06/13/23 0842)  levothyroxine  (SYNTHROID ) tablet 100 mcg (100 mcg Oral Given 06/13/23 0528)  predniSONE  (DELTASONE ) tablet 10 mg (10 mg Oral Given 06/13/23 0850)  sertraline  (ZOLOFT ) tablet 50 mg (50 mg Oral Given 06/13/23 0850)  azaTHIOprine  (IMURAN ) tablet 150 mg (150 mg Oral Given 06/12/23 1148)  insulin  aspart (novoLOG ) injection 0-9 Units (2 Units Subcutaneous Given 06/13/23 0848)  insulin  aspart (novoLOG ) injection 0-5 Units (2 Units Subcutaneous Given 06/12/23  2119)  sodium chloride  flush (NS) 0.9 % injection 10-40 mL (10 mLs Intracatheter Given 06/13/23 0851)  sodium chloride  flush (NS) 0.9 % injection 10-40  mL (has no administration in time range)  0.9 %  sodium chloride  infusion (0 mLs Intravenous Stopped 06/11/23 2316)  Oral care mouth rinse (15 mLs Mouth Rinse Given 06/13/23 0851)  Oral care mouth rinse (has no administration in time range)  HYDROcodone  bit-homatropine (HYCODAN) 5-1.5 MG/5ML syrup 5 mL (5 mLs Oral Given 06/13/23 0849)  amiodarone  (NEXTERONE ) 1.8 mg/mL load via infusion 150 mg (150 mg Intravenous Bolus from Bag 06/09/23 1358)  sodium zirconium cyclosilicate  (LOKELMA ) packet 10 g (10 g Oral Given 06/09/23 1643)  dextrose  50 % solution 50 mL (50 mLs Intravenous Given 06/09/23 1608)  insulin  aspart (novoLOG ) injection 10 Units (10 Units Intravenous Given 06/09/23 1700)  calcium  gluconate 1 g/ 50 mL sodium chloride  IVPB (0 mg Intravenous Stopped 06/09/23 1748)  amiodarone  (NEXTERONE ) 1.8 mg/mL load via infusion 150 mg (150 mg Intravenous Bolus from Bag 06/10/23 0849)  ipratropium-albuterol  (DUONEB) 0.5-2.5 (3) MG/3ML nebulizer solution 3 mL (3 mLs Nebulization Given 06/10/23 1500)  amiodarone  (NEXTERONE ) 1.8 mg/mL load via infusion 150 mg (150 mg Intravenous Bolus from Bag 06/11/23 1011)    ED Course/ Medical Decision Making/ A&P CRITICAL CARE Performed by: Fairy Sermon Total critical care time: 45 minutes Critical care time was exclusive of separately billable procedures and treating other patients. Critical care was necessary to treat or prevent imminent or life-threatening deterioration. Critical care was time spent personally by me on the following activities: development of treatment plan with patient and/or surrogate as well as nursing, discussions with consultants, evaluation of patient's response to treatment, examination of patient, obtaining history from patient or surrogate, ordering and performing treatments and interventions, ordering  and review of laboratory studies, ordering and review of radiographic studies, pulse oximetry and re-evaluation of patient's condition.  Patient with rapid atrial flutter.  Patient has had this before and responded to amiodarone .  I spoke with cardiology and we started amiodarone  on her and she will be admitted to medicine Click here for ABCD2, HEART and other calculatorsREFRESH Note before signing :1}                              Medical Decision Making Amount and/or Complexity of Data Reviewed Labs: ordered. Radiology: ordered.  Risk Decision regarding hospitalization.  This patient presents to the ED for concern of palpitations, this involves an extensive number of treatment options, and is a complaint that carries with it a high risk of complications and morbidity.  The differential diagnosis includes atrial flutter, PVCs   Co morbidities that complicate the patient evaluation  Hypertension COPD diabetes   Additional history obtained:  Additional history obtained from patient External records from outside source obtained and reviewed including hospital records   Lab Tests:  I Ordered, and personally interpreted labs.  The pertinent results include: Troponin 37   Imaging Studies ordered:  I ordered imaging studies including chest x-ray I independently visualized and interpreted imaging which showed cardiomegaly I agree with the radiologist interpretation   Cardiac Monitoring: / EKG:  The patient was maintained on a cardiac monitor.  I personally viewed and interpreted the cardiac monitored which showed an underlying rhythm of: Rapid atrial flutter   Consultations Obtained:  I requested consultation with the internal medicine and cardiology,  and discussed lab and imaging findings as well as pertinent plan - they recommend: Start amiodarone  and admit to medicine with cardiology consult   Problem List / ED Course / Critical interventions / Medication  management  Rapid atrial flutter, hypertension, COPD I ordered medication including amiodarone  for flutter Reevaluation of the patient after these medicines showed that the patient improved I have reviewed the patients home medicines and have made adjustments as needed   Social Determinants of Health:  None   Test / Admission - Considered:  None  Rapid atrial flutter treated with amiodarone .  She will be admitted to medicine with cardiology consult        Final Clinical Impression(s) / ED Diagnoses Final diagnoses:  None    Rx / DC Orders ED Discharge Orders     None         Suzette Pac, MD 06/13/23 1112

## 2023-06-14 DIAGNOSIS — I4892 Unspecified atrial flutter: Secondary | ICD-10-CM | POA: Diagnosis not present

## 2023-06-14 DIAGNOSIS — I4891 Unspecified atrial fibrillation: Secondary | ICD-10-CM | POA: Diagnosis not present

## 2023-06-14 LAB — COMPREHENSIVE METABOLIC PANEL
ALT: 39 U/L (ref 0–44)
AST: 84 U/L — ABNORMAL HIGH (ref 15–41)
Albumin: 2.4 g/dL — ABNORMAL LOW (ref 3.5–5.0)
Alkaline Phosphatase: 89 U/L (ref 38–126)
Anion gap: 9 (ref 5–15)
BUN: 13 mg/dL (ref 8–23)
CO2: 26 mmol/L (ref 22–32)
Calcium: 8 mg/dL — ABNORMAL LOW (ref 8.9–10.3)
Chloride: 97 mmol/L — ABNORMAL LOW (ref 98–111)
Creatinine, Ser: 0.56 mg/dL (ref 0.44–1.00)
GFR, Estimated: >60 mL/min (ref >60)
Glucose, Bld: 137 mg/dL — ABNORMAL HIGH (ref 70–99)
Potassium: 3.6 mmol/L (ref 3.5–5.1)
Sodium: 132 mmol/L — ABNORMAL LOW (ref 135–145)
Total Bilirubin: 1.3 mg/dL — ABNORMAL HIGH (ref 0.0–1.2)
Total Protein: 5.6 g/dL — ABNORMAL LOW (ref 6.5–8.1)

## 2023-06-14 LAB — CBC
HCT: 30.1 % — ABNORMAL LOW (ref 36.0–46.0)
Hemoglobin: 9.8 g/dL — ABNORMAL LOW (ref 12.0–15.0)
MCH: 35.9 pg — ABNORMAL HIGH (ref 26.0–34.0)
MCHC: 32.6 g/dL (ref 30.0–36.0)
MCV: 110.3 fL — ABNORMAL HIGH (ref 80.0–100.0)
Platelets: 172 10*3/uL (ref 150–400)
RBC: 2.73 MIL/uL — ABNORMAL LOW (ref 3.87–5.11)
RDW: 20.6 % — ABNORMAL HIGH (ref 11.5–15.5)
WBC: 2.3 10*3/uL — ABNORMAL LOW (ref 4.0–10.5)
nRBC: 0 % (ref 0.0–0.2)

## 2023-06-14 LAB — GLUCOSE, CAPILLARY
Glucose-Capillary: 187 mg/dL — ABNORMAL HIGH (ref 70–99)
Glucose-Capillary: 166 mg/dL — ABNORMAL HIGH (ref 70–99)
Glucose-Capillary: 284 mg/dL — ABNORMAL HIGH (ref 70–99)
Glucose-Capillary: 156 mg/dL — ABNORMAL HIGH (ref 70–99)

## 2023-06-14 LAB — MAGNESIUM: Magnesium: 1.7 mg/dL (ref 1.7–2.4)

## 2023-06-14 MED ORDER — METOPROLOL TARTRATE 5 MG/5ML IV SOLN
2.5000 mg | Freq: Four times a day (QID) | INTRAVENOUS | Status: DC
  Administered 2023-06-14 – 2023-06-17 (×13): 2.5 mg via INTRAVENOUS
  Filled 2023-06-14 (×13): qty 5

## 2023-06-14 NOTE — Progress Notes (Signed)
 PROGRESS NOTE    [name]  FMW:[id number] DOB: [birth year] DOA: 06/09/2023 PCP: Dawayne Kerney SQUIBB, MD   Brief Narrative:    [name] is a 78 y.o. female with medical history significant for COPD, hypertension, dyslipidemia, hypothyroidism, CAD status post stent placement, GERD, type 2 diabetes, blindness, and obesity.  She is wheelchair dependent for the last year and patient is a resident at ALF.  She was brought in from her facility due to dyspnea and also appears to have had trouble with nausea and vomiting for the last 2-3 days.  She was admitted for control of wide-complex tachyarrhythmia in the setting of PAF and remains on IV amiodarone  infusion.  She is now noted to have RSV infection as well.  Cardiology continues to follow for heart rate control and have started her on scheduled metoprolol  to assist with heart rate control on 1/14.  Assessment & Plan:   Principal Problem:   Atrial flutter with rapid ventricular response (HCC) Active Problems:   Hypothyroidism   Obesity, Class II, BMI 35-39.9   Essential hypertension   Coronary artery disease   Gastroesophageal reflux disease   DM type 2 (diabetes mellitus, type 2) (HCC)   Acute diastolic CHF (congestive heart failure) (HCC)   Autoimmune hepatitis (HCC)   Paroxysmal atrial fibrillation with RVR (HCC)  Assessment and Plan:   Wide-complex tachyarrhythmia in the setting of PAF -Continue IV amiodarone  per cardiology, also started on scheduled IV metoprolol  on 1/14. 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   Consultants:  Cardiology  Procedures:  None  Antimicrobials:  None   Subjective: Patient seen and evaluated today and overall appears to be doing well, but continues to have elevated heart rates.  Cough is improved.  Objective: Vitals:   06/14/23 0756 06/14/23 0800 06/14/23 0900 06/14/23 0930  BP:  113/82 106/62 114/87  Pulse:  (!) 132 (!) 127 (!) 111  Resp:  (!) 26 (!) 21 (!) 25  Temp: 98.2 F (36.8 C)     TempSrc: Oral     SpO2:  97% 95% 96%  Weight:      Height:        Intake/Output Summary (Last 24 hours) at 06/14/2023 1029 Last data filed at 06/14/2023 0900 Gross per 24 hour  Intake 910.2 ml  Output 450 ml  Net 460.2 ml   Filed Weights   06/12/23 0530  Weight: 80.5 kg    Examination:  General exam: Appears calm and comfortable  Respiratory system: Clear to auscultation. Respiratory effort normal.  Nasal cannula oxygen  Cardiovascular system: S1 & S2 heard, irregular and tachycardic Gastrointestinal system: Abdomen is soft Central nervous system: Alert and awake Extremities: No edema Skin: No significant lesions noted Psychiatry: Flat affect.    Data  Reviewed: I have personally reviewed following labs and imaging studies  CBC: Recent Labs  Lab 06/09/23 1322 06/10/23 0432 06/11/23 0445 06/12/23 0515 06/13/23 0300 06/14/23 0356  WBC 2.1* 1.7* 1.7* 1.8* 2.0* 2.3*  NEUTROABS 1.7  --   --   --   --   --   HGB 10.2* 10.4* 9.7* 9.0* 9.6* 9.8*  HCT 31.9* 31.8* 29.6* 26.7* 28.8* 30.1*  MCV 113.9* 114.0* 112.1* 111.3* 111.2* 110.3*  PLT 155 163 153 123* 154 172   Basic Metabolic Panel: Recent Labs  Lab 06/10/23 0432 06/11/23 0445 06/12/23 0515 06/13/23 0300 06/14/23 0356  NA 129* 128* 127* 127* 132*  K 4.1 3.8 3.7 3.5 3.6  CL 95* 93* 92* 93* 97*  CO2 25 26 26 23 26   GLUCOSE 122* 137* 230* 222* 137*  BUN 15 14 16 15 13   CREATININE 0.64 0.67 0.57 0.60 0.56  CALCIUM  8.6* 8.1* 7.6* 7.5* 8.0*  MG 1.7 1.6* 1.7 1.8 1.7   GFR: Estimated Creatinine Clearance: 58.4 mL/min (by C-G formula based on SCr of 0.56 mg/dL). Liver Function Tests: Recent Labs  Lab 06/10/23 0432 06/11/23 0445 06/12/23 0515 06/13/23 0300 06/14/23 0356  AST 101* 108* 96* 94* 84*  ALT 49* 47* 42 41 39  ALKPHOS 80 74 70 87 89  BILITOT 1.4* 1.3* 1.4* 1.3* 1.3*  PROT 6.1* 5.6* 5.1* 5.2* 5.6*  ALBUMIN 2.8* 2.5* 2.2* 2.3* 2.4*   No results for input(s): LIPASE, AMYLASE in the last 168 hours. No results for input(s): AMMONIA in the last 168 hours. Coagulation Profile: No results for input(s): INR, PROTIME in the last 168 hours. Cardiac Enzymes: No results for input(s): CKTOTAL, CKMB, CKMBINDEX, TROPONINI in the last 168 hours. BNP (last 3 results) No results for input(s): PROBNP in the last 8760 hours. HbA1C: No results for input(s): HGBA1C in the last 72 hours. CBG: Recent Labs  Lab 06/13/23 0757 06/13/23 1105 06/13/23 1611 06/13/23 2122 06/14/23 0746  GLUCAP 155* 181* 248* 147* 187*   Lipid Profile: No results for input(s): CHOL, HDL, LDLCALC, TRIG, CHOLHDL, LDLDIRECT in the last 72 hours. Thyroid  Function  Tests: Recent Labs    06/12/23 0815  FREET4 1.33*   Anemia Panel: No results for input(s): VITAMINB12, FOLATE, FERRITIN, TIBC, IRON , RETICCTPCT in the last 72 hours. Sepsis Labs: No results for input(s): PROCALCITON, LATICACIDVEN in the last 168 hours.  Recent Results (from the past 240 hours)  MRSA Next Gen by PCR, Nasal     Status: None   Collection Time: 06/09/23  2:22 PM   Specimen: Nasal Mucosa; Nasal Swab  Result Value Ref Range Status   MRSA by PCR Next Gen NOT DETECTED NOT DETECTED Final    Comment: (NOTE) The GeneXpert MRSA Assay (FDA approved for NASAL specimens only), is one component of a comprehensive MRSA colonization surveillance program. It is not intended to diagnose MRSA infection nor to guide or monitor treatment for MRSA infections. Test performance is not FDA approved in patients less than 37 years old. Performed at [facility], [address] [place], [place] [address]   Resp panel by RT-PCR (RSV, Flu A&B, Covid) Anterior Nasal Swab     Status:  Abnormal   Collection Time: 06/10/23  9:33 AM   Specimen: Anterior Nasal Swab  Result Value Ref Range Status   SARS Coronavirus 2 by RT PCR NEGATIVE NEGATIVE Final    Comment: (NOTE) SARS-CoV-2 target nucleic acids are NOT DETECTED.  The SARS-CoV-2 RNA is generally detectable in upper respiratory specimens during the acute phase of infection. The lowest concentration of SARS-CoV-2 viral copies this assay can detect is 138 copies/mL. A negative result does not preclude SARS-Cov-2 infection and should not be used as the sole basis for treatment or other patient management decisions. A negative result may occur with  improper specimen collection/handling, submission of specimen other than nasopharyngeal swab, presence of viral mutation(s) within the areas targeted by this assay, and inadequate number of viral copies(<138 copies/mL). A negative result must be combined with clinical observations,  patient history, and epidemiological information. The expected result is Negative.  Fact Sheet for Patients:  bloggercourse.com  Fact Sheet for Healthcare Providers:  seriousbroker.it  This test is no t yet approved or cleared by the [place]  FDA and  has been authorized for detection and/or diagnosis of SARS-CoV-2 by FDA under an Emergency Use Authorization (EUA). This EUA will remain  in effect (meaning this test can be used) for the duration of the COVID-19 declaration under Section 564(b)(1) of the Act, 21 U.S.C.section 360bbb-3(b)(1), unless the authorization is terminated  or revoked sooner.       Influenza A by PCR NEGATIVE NEGATIVE Final   Influenza B by PCR NEGATIVE NEGATIVE Final    Comment: (NOTE) The Xpert Xpress SARS-CoV-2/FLU/RSV plus assay is intended as an aid in the diagnosis of influenza from Nasopharyngeal swab specimens and should not be used as a sole basis for treatment. Nasal washings and aspirates are unacceptable for Xpert Xpress SARS-CoV-2/FLU/RSV testing.  Fact Sheet for Patients: bloggercourse.com  Fact Sheet for Healthcare Providers: seriousbroker.it  This test is not yet approved or cleared by the [place]  FDA and has been authorized for detection and/or diagnosis of SARS-CoV-2 by FDA under an Emergency Use Authorization (EUA). This EUA will remain in effect (meaning this test can be used) for the duration of the COVID-19 declaration under Section 564(b)(1) of the Act, 21 U.S.C. section 360bbb-3(b)(1), unless the authorization is terminated or revoked.     Resp Syncytial Virus by PCR POSITIVE (A) NEGATIVE Final    Comment: (NOTE) Fact Sheet for Patients: bloggercourse.com  Fact Sheet for Healthcare Providers: seriousbroker.it  This test is not yet approved or cleared by the [place]  [place] FDA and has been authorized for detection and/or diagnosis of SARS-CoV-2 by FDA under an Emergency Use Authorization (EUA). This EUA will remain in effect (meaning this test can be used) for the duration of the COVID-19 declaration under Section 564(b)(1) of the Act, 21 U.S.C. section 360bbb-3(b)(1), unless the authorization is terminated or revoked.  Performed at [facility], [address], [place], [place] [address]          Radiology Studies: No results found.       Scheduled Meds:  amitriptyline   50 mg Oral QHS   apixaban   5 mg Oral BID   azaTHIOprine   150 mg Oral Daily   Chlorhexidine  Gluconate Cloth  6 each Topical Q0600   ezetimibe   10 mg Oral Daily   feeding supplement  1 Container Oral TID BM   hydrALAZINE   50 mg Oral TID   insulin  aspart  0-5 Units Subcutaneous QHS   insulin  aspart  0-9 Units Subcutaneous  TID WC   levothyroxine   100 mcg Oral QAC breakfast   losartan   50 mg Oral Daily   metoprolol  tartrate  2.5 mg Intravenous Q6H   mouth rinse  15 mL Mouth Rinse 4 times per day   predniSONE   10 mg Oral Daily   sertraline   50 mg Oral Daily   sodium chloride  flush  10-40 mL Intracatheter Q12H   Continuous Infusions:  amiodarone  30 mg/hr (06/14/23 0000)     LOS: 5 days    Time spent: 55 minutes    Renesmae Donahey JONETTA Fairly, DO [facility]  If 7PM-7AM, please contact night-coverage [email or web address] 06/14/2023, 10:29 AM

## 2023-06-14 NOTE — Progress Notes (Signed)
 Bree/Julie CNA's aware to not let pt.lay flat to eat meals. Pt.needs supervision. (Pt. At times demands to lay flat to eat) [email or web address] by RN of importance to not lay flat during PO intake due to high risk of aspiration)

## 2023-06-14 NOTE — Progress Notes (Signed)
 Progress Note  Patient Name: [name] Date of Encounter: 06/14/2023  Primary Cardiologist: Alvan Carrier, MD  Interval Summary   Chart reviewed.  States that her cough is somewhat better, lungs still congested however.  Appetite fair.  Denies any chest pain or palpitations.  Vital Signs    Vitals:   06/14/23 0400 06/14/23 0730 06/14/23 0756 06/14/23 0800  BP: 117/76 110/68  113/82  Pulse: (!) 114 (!) 128  (!) 132  Resp: 20 (!) 25  (!) 26  Temp: 98.2 F (36.8 C)  98.2 F (36.8 C)   TempSrc: Oral  Oral   SpO2: 97% 97%  97%  Weight:      Height:        Intake/Output Summary (Last 24 hours) at 06/14/2023 0932 Last data filed at 06/14/2023 0600 Gross per 24 hour  Intake 910.2 ml  Output 450 ml  Net 460.2 ml   Filed Weights   06/12/23 0530  Weight: 80.5 kg    Physical Exam   GEN: No acute distress.   Neck: No JVD. Cardiac: Irregularly irregular, no gallop.  Respiratory: Prolonged expiratory phase and wheeze with scattered rhonchi. GI: Soft, nontender, bowel sounds present. MS: No edema.  ECG/Telemetry    Telemetry reviewed showing atrial fibrillation with RVR.  Labs    Chemistry Recent Labs  Lab 06/12/23 0515 06/13/23 0300 06/14/23 0356  NA 127* 127* 132*  K 3.7 3.5 3.6  CL 92* 93* 97*  CO2 26 23 26   GLUCOSE 230* 222* 137*  BUN 16 15 13   CREATININE 0.57 0.60 0.56  CALCIUM  7.6* 7.5* 8.0*  PROT 5.1* 5.2* 5.6*  ALBUMIN 2.2* 2.3* 2.4*  AST 96* 94* 84*  ALT 42 41 39  ALKPHOS 70 87 89  BILITOT 1.4* 1.3* 1.3*  GFRNONAA >60 >60 >60  ANIONGAP 9 11 9     Hematology Recent Labs  Lab 06/12/23 0515 06/13/23 0300 06/14/23 0356  WBC 1.8* 2.0* 2.3*  RBC 2.40* 2.59* 2.73*  HGB 9.0* 9.6* 9.8*  HCT 26.7* 28.8* 30.1*  MCV 111.3* 111.2* 110.3*  MCH 37.5* 37.1* 35.9*  MCHC 33.7 33.3 32.6  RDW 20.3* 20.7* 20.6*  PLT 123* 154 172   Cardiac Enzymes Recent Labs  Lab 06/09/23 1322 06/09/23 1601  TROPONINIHS 43* 37*   Lipid Panel      Component Value Date/Time   CHOL 116 07/25/2012 0815   TRIG 96 07/25/2012 0815   HDL 38 (L) 07/25/2012 0815   CHOLHDL 3.1 07/25/2012 0815   VLDL 19 07/25/2012 0815   LDLCALC 59 07/25/2012 0815    Assessment & Plan   1.  Paroxysmal to persistent atrial fibrillation with RVR.  CHA2DS2-VASc score is 7.  Currently on IV amiodarone  along with Eliquis .  Has history of prior junctional bradycardia following cardioversion on prior admission.  Heart rate remains uncontrolled.  2.  Paroxysmal wide-complex tachycardia, likely NSVT.  No sustained events.  3.  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  4.  CAD s/p BMS to the circumflex in 2003 as well as DES to the circumflex, OM 3, and RCA in 2006.  No obvious angina.  5.  Active RSV infection.  Has COPD at baseline.  6.  Hypothyroidism on Synthroid .  TSH 0.461.  Would continue IV amiodarone  and Eliquis .  Plan to change IV Lopressor  to 2.5 mg every 6 hours and titrate as tolerated to try and get heart rate down somewhat more as this may help her to convert.  Would not change to oral long-acting regimen at this point.  Otherwise on Zetia , hydralazine , and Cozaar .  For questions or updates, please contact [facility] Please consult [email or web address] for contact info under   Signed, Jayson Sierras, MD  06/14/2023, 9:32 AM

## 2023-06-14 NOTE — Plan of Care (Signed)
  Problem: Activity: Goal: Risk for activity intolerance will decrease Outcome: Progressing   Problem: Safety: Goal: Ability to remain free from injury will improve Outcome: Progressing   Problem: Fluid Volume: Goal: Ability to maintain a balanced intake and output will improve Outcome: Not Progressing

## 2023-06-15 DIAGNOSIS — I4891 Unspecified atrial fibrillation: Secondary | ICD-10-CM | POA: Diagnosis not present

## 2023-06-15 DIAGNOSIS — I4892 Unspecified atrial flutter: Secondary | ICD-10-CM | POA: Diagnosis not present

## 2023-06-15 LAB — COMPREHENSIVE METABOLIC PANEL
ALT: 36 U/L (ref 0–44)
AST: 77 U/L — ABNORMAL HIGH (ref 15–41)
Albumin: 2.2 g/dL — ABNORMAL LOW (ref 3.5–5.0)
Alkaline Phosphatase: 98 U/L (ref 38–126)
Anion gap: 10 (ref 5–15)
BUN: 13 mg/dL (ref 8–23)
CO2: 24 mmol/L (ref 22–32)
Calcium: 8.1 mg/dL — ABNORMAL LOW (ref 8.9–10.3)
Chloride: 98 mmol/L (ref 98–111)
Creatinine, Ser: 0.58 mg/dL (ref 0.44–1.00)
GFR, Estimated: >60 mL/min (ref >60)
Glucose, Bld: 150 mg/dL — ABNORMAL HIGH (ref 70–99)
Potassium: 4 mmol/L (ref 3.5–5.1)
Sodium: 132 mmol/L — ABNORMAL LOW (ref 135–145)
Total Bilirubin: 1.1 mg/dL (ref 0.0–1.2)
Total Protein: 5.3 g/dL — ABNORMAL LOW (ref 6.5–8.1)

## 2023-06-15 LAB — CBC
HCT: 28.8 % — ABNORMAL LOW (ref 36.0–46.0)
Hemoglobin: 9.6 g/dL — ABNORMAL LOW (ref 12.0–15.0)
MCH: 36.4 pg — ABNORMAL HIGH (ref 26.0–34.0)
MCHC: 33.3 g/dL (ref 30.0–36.0)
MCV: 109.1 fL — ABNORMAL HIGH (ref 80.0–100.0)
Platelets: 172 10*3/uL (ref 150–400)
RBC: 2.64 MIL/uL — ABNORMAL LOW (ref 3.87–5.11)
RDW: 20.6 % — ABNORMAL HIGH (ref 11.5–15.5)
WBC: 2.3 10*3/uL — ABNORMAL LOW (ref 4.0–10.5)
nRBC: 0 % (ref 0.0–0.2)

## 2023-06-15 LAB — MAGNESIUM: Magnesium: 1.7 mg/dL (ref 1.7–2.4)

## 2023-06-15 LAB — GLUCOSE, CAPILLARY
Glucose-Capillary: 156 mg/dL — ABNORMAL HIGH (ref 70–99)
Glucose-Capillary: 217 mg/dL — ABNORMAL HIGH (ref 70–99)
Glucose-Capillary: 232 mg/dL — ABNORMAL HIGH (ref 70–99)
Glucose-Capillary: 226 mg/dL — ABNORMAL HIGH (ref 70–99)

## 2023-06-15 MED ORDER — SENNOSIDES-DOCUSATE SODIUM 8.6-50 MG PO TABS
1.0000 | ORAL_TABLET | Freq: Every day | ORAL | Status: DC
  Administered 2023-06-15 – 2023-06-20 (×6): 1 via ORAL
  Filled 2023-06-15 (×6): qty 1

## 2023-06-15 NOTE — Progress Notes (Signed)
 [facility] PROGRESS NOTE  [name] (DOB: 01-19-1946) GMW:[id number] PCP: Deedra Farr, MD  Brief Narrative: [name] is a 78 y.o. female with a history of CAD s/p PCI, T2DM, HTN, HLD, COPD, obesity, hypothyroidism, and blindness who presented to the ED on 06/09/2023 from ALF due to dyspnea and some nausea, vomiting. She was found to be positive for RSV infection and had recurrent atrial fibrillation with RVR for which she was admitted to the ICU on amiodarone  infusion with cardiology consultation.   Subjective: Cough is still severe but improved with medications as ordered. Says she hasn't gotten out of bed yet. Eating fine. No chest pain or palpitations.   Objective: BP (!) 141/93   Pulse (!) 116   Temp 97.9 F (36.6 C) (Oral)   Resp 18   Ht 5\' 1"  (1.549 m)   Wt 81.2 kg   SpO2 97%   BMI 33.82 kg/m   Gen: 77yo F in no distress HEENT: Holds eyes closed Pulm: Clear, nonlabored. Frequent nonproductive cough. CV: Rapid irreg, no MRG or edema GI: Soft, NT, ND, +BS Neuro: Alert and oriented. No new focal deficits. Ext: Warm, no deformities Skin: No new rashes, lesions or ulcers on visualized skin   Assessment & Plan: Paroxysmal to persistent atrial fibrillation with RVR:  - Continue amiodarone  gtt at 30mg /hr. May still convert with resolution of RSV infection. In the past has entered junctional bradycardic rhythm in that setting, so reluctant, per my discussion with Dr. Londa Rival, to convert to oral negative chronotrope for now.  - Continue IV metoprolol . Given mildly reduced LVEF, CCB not ideal.  - Continue eliquis  for CHA2DS2-VASc score of 7.   RSV infection: No pneumonia by CXR, no hypoxemia. No wheezing to suggest AECOPD at this time.  - Continue antitussives, incentive spirometry. Will get PT/OT and encourage OOB, prn xopenex  - Droplet precautions - Continue antiemetics, suspected to be due to RSV.   Autoimmune hepatitis:  - Continue azathioprine  and  prednisone  10mg  daily (no indication for stress dosing at this time).   T2DM: HbA1c 8.4%.  - Continue SSI, reasonably well-controlled thus far.   Leukopenia, anemia: Afebrile, ok to continue azathioprine  for now.   SIADH: Mild hyponatremia, chronic. Asymptomatic. Isoosmolar.  - Continue 1,200cc fluid restriction, monitoring BMP.   NSVT: No sustained events. On amio, metoprolol , continuing cardiac monitoring.   Chronic HFmrEF, HTN: Based on echo Nov 2024 LVEF 45-50%. No evidence of exacerbation at this time.  - Continue ARB, BB, hydralazine  50mg  TID  CAD, HLD: s/p BMS (LCx) 2003 and DES (LCx, OM3, RCA) 2006. No current angina.  - Continue metoprolol , zetia  (not on statin due to autoimmune hepatitis I suspect)  Hypothyroidism: TSH 0.461.  - Continue synthroid . With free T4 slightly elevated at 1.33, consider dose reduction if TFTs remain near these levels outside the scope of acute illness.   GERD:  - PPI  Depression:  - Continue amitriptyline  and sertraline   Wynetta Heckle, MD [facility] [email or web address] 06/15/2023, 9:59 AM

## 2023-06-15 NOTE — Evaluation (Signed)
 Physical Therapy Evaluation Patient Details Name: [name] MRN: [id number] DOB: [birth year] Today's Date: 06/15/2023  History of Present Illness  [name]  is a 78 y.o. female,   with a history of COPD, room air at baseline, hypertension, hyperlipidemia, hypothyroidism, CAD s/p stent placement, GERD, diabetes mellitus type 2, blindness, obesity.  She is wheelchair dependent for last year.  Sisters at bedside,  Pt is SNF resident, she was brought by the facility for dyspnea, hypoxia, at baseline patient with oxygen  requirement, she was noted by facility to be dyspneic today, with hypoxia 80% on room air, EMS were called she was found to be in A-fib with heart rate in the 130s, requiring oxygen  2 L which brings her saturation up to 90%, denies any chest pain, fever, chills, cough, she does report vomiting couple times today, she denies any abdominal pain, diarrhea .   Clinical Impression  Patient was agreeable to therapy. Was able to go from supine to sit with mod assist. Patient demonstrated slow labored movements when performing bed mobility. With use of RW and mod assist for PT patient was able to stand for a few seconds before needing to sit down. When seated patient BP jumped up and patient felt nauseas. Patient was laid back down and therapy session was concluded. RN was made aware about change in BP. Patient will benefit from continued skilled physical therapy in hospital and recommended venue below to increase strength, balance, endurance for safe ADLs and gait.          If plan is discharge home, recommend the following: A lot of help with bathing/dressing/bathroom;A lot of help with walking and/or transfers;Help with stairs or ramp for entrance;Assistance with cooking/housework   Can travel by private vehicle   No    Equipment Recommendations None recommended by PT  Recommendations for Other Services       Functional Status Assessment Patient has had a recent decline in  their functional status and demonstrates the ability to make significant improvements in function in a reasonable and predictable amount of time.     Precautions / Restrictions Precautions Precautions: Fall Restrictions Weight Bearing Restrictions Per Provider Order: No      Mobility  Bed Mobility Overal bed mobility: Needs Assistance Bed Mobility: Supine to Sit, Sit to Supine     Supine to sit: Mod assist Sit to supine: Mod assist     Patient Response: Cooperative  Transfers Overall transfer level: Needs assistance Equipment used: Rolling walker (2 wheels) Transfers: Sit to/from Stand Sit to Stand: Mod assist           General transfer comment: Pt was able to stand for a few seconds needed assistance getting motion going    Ambulation/Gait                  Stairs            Wheelchair Mobility     Tilt Bed Tilt Bed Patient Response: Cooperative  Modified Rankin (Stroke Patients Only)       Balance Overall balance assessment: Needs assistance Sitting-balance support: Feet unsupported, Bilateral upper extremity supported Sitting balance-Leahy Scale: Good Sitting balance - Comments: was a little unsteady but was able to maintain balance   Standing balance support: Bilateral upper extremity supported Standing balance-Leahy Scale: Poor Standing balance comment: used a RW for support when standing  Pertinent Vitals/Pain Pain Assessment Pain Assessment: No/denies pain    Home Living Family/patient expects to be discharged to:: Assisted living                 Home Equipment: Wheelchair - manual Additional Comments: Patient stating she can ambulate, patient questionable hisorian as chart stating mostly transfers to [facility]    Prior Function Prior Level of Function : Needs assist       Physical Assist : Mobility (physical);ADLs (physical) Mobility (physical): Bed mobility;Transfers;Gait    Mobility Comments: Supervised transfers to wheelchair, uses w/c for mobility ADLs Comments: Assisted by ALF staff     Extremity/Trunk Assessment                Communication   Communication Communication: No apparent difficulties Cueing Techniques: Verbal cues;Tactile cues  Cognition Arousal: Alert Behavior During Therapy: WFL for tasks assessed/performed Overall Cognitive Status: Within Functional Limits for tasks assessed                                          General Comments      Exercises     Assessment/Plan    PT Assessment Patient needs continued PT services  PT Problem List Decreased strength;Decreased range of motion;Decreased activity tolerance;Decreased balance;Decreased mobility       PT Treatment Interventions DME instruction;Gait training;Stair training;Functional mobility training;Therapeutic activities;Therapeutic exercise;Balance training    PT Goals (Current goals can be found in the Care Plan section)  Acute Rehab PT Goals Patient Stated Goal: Return to home PT Goal Formulation: With patient Time For Goal Achievement: 06/29/23 Potential to Achieve Goals: Good    Frequency Min 3X/week     Co-evaluation               AM-PAC PT "6 Clicks" Mobility  Outcome Measure Help needed turning from your back to your side while in a flat bed without using bedrails?: A Lot Help needed moving from lying on your back to sitting on the side of a flat bed without using bedrails?: A Lot Help needed moving to and from a bed to a chair (including a wheelchair)?: A Lot Help needed standing up from a chair using your arms (e.g., wheelchair or bedside chair)?: A Lot Help needed to walk in hospital room?: Total Help needed climbing 3-5 steps with a railing? : Total 6 Click Score: 10    End of Session   Activity Tolerance: Patient limited by fatigue;Other (comment) (increased BP and complaint of nausea) Patient left: in bed;with call  bell/phone within reach Nurse Communication: Mobility status PT Visit Diagnosis: Unsteadiness on feet (R26.81);Other abnormalities of gait and mobility (R26.89);Muscle weakness (generalized) (M62.81);History of falling (Z91.81)    Time: 1610-9604 PT Time Calculation (min) (ACUTE ONLY): 28 min   Charges:   PT Evaluation $PT Eval Moderate Complexity: 1 Mod PT Treatments $Therapeutic Activity: 23-37 mins PT General Charges $$ ACUTE PT VISIT: 1 Visit         Weltha Cathy SPT

## 2023-06-15 NOTE — Progress Notes (Signed)
 Progress Note  Patient Name: [name] Date of Encounter: 06/15/2023  Primary Cardiologist: Armida Lander, MD  Interval Summary   Interval chart reviewed.  Intermittent cough, mildly productive yesterday, but breathing more comfortably.  Reports no chest pain or palpitations.  Vital Signs    Vitals:   06/15/23 0733 06/15/23 0734 06/15/23 0800 06/15/23 0830  BP: (!) 107/59  (!) 145/71 (!) 141/93  Pulse:  (!) 120 (!) 110 (!) 116  Resp: (!) 26 20 17 18   Temp:      TempSrc:      SpO2: 99% 94% 99% 97%  Weight:      Height:        Intake/Output Summary (Last 24 hours) at 06/15/2023 0902 Last data filed at 06/15/2023 0517 Gross per 24 hour  Intake 763.92 ml  Output 325 ml  Net 438.92 ml   Filed Weights   06/12/23 0530 06/15/23 0500  Weight: 80.5 kg 81.2 kg    Physical Exam   GEN: No acute distress.   Neck: No JVD. Cardiac: Irregularly irregular without gallop.  Respiratory: Prolonged expiratory phase, less wheezing, scattered rhonchi. GI: Soft, nontender, bowel sounds present. MS: No edema.  ECG/Telemetry    Telemetry reviewed showing atrial fibrillation, heart rate 100-120.  Labs    Chemistry Recent Labs  Lab 06/13/23 0300 06/14/23 0356 06/15/23 0457  NA 127* 132* 132*  K 3.5 3.6 4.0  CL 93* 97* 98  CO2 23 26 24   GLUCOSE 222* 137* 150*  BUN 15 13 13   CREATININE 0.60 0.56 0.58  CALCIUM  7.5* 8.0* 8.1*  PROT 5.2* 5.6* 5.3*  ALBUMIN 2.3* 2.4* 2.2*  AST 94* 84* 77*  ALT 41 39 36  ALKPHOS 87 89 98  BILITOT 1.3* 1.3* 1.1  GFRNONAA >60 >60 >60  ANIONGAP 11 9 10     Hematology Recent Labs  Lab 06/13/23 0300 06/14/23 0356 06/15/23 0457  WBC 2.0* 2.3* 2.3*  RBC 2.59* 2.73* 2.64*  HGB 9.6* 9.8* 9.6*  HCT 28.8* 30.1* 28.8*  MCV 111.2* 110.3* 109.1*  MCH 37.1* 35.9* 36.4*  MCHC 33.3 32.6 33.3  RDW 20.7* 20.6* 20.6*  PLT 154 172 172   Cardiac Enzymes Recent Labs  Lab 06/09/23 1322 06/09/23 1601  TROPONINIHS 43* 37*   Lipid Panel      Component Value Date/Time   CHOL 116 07/25/2012 0815   TRIG 96 07/25/2012 0815   HDL 38 (L) 07/25/2012 0815   CHOLHDL 3.1 07/25/2012 0815   VLDL 19 07/25/2012 0815   LDLCALC 59 07/25/2012 0815    Assessment & Plan   1.  Paroxysmal to persistent atrial fibrillation with RVR.  CHA2DS2-VASc score is 7.  Currently on IV amiodarone  along with Eliquis .  Has history of prior junctional bradycardia following conversion on prior admission.  Tolerating standing dose of IV Lopressor .  2.  Paroxysmal wide-complex tachycardia, likely NSVT.  No sustained events.  3.  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  4.  CAD s/p BMS to the circumflex in 2003 as well as DES to the circumflex, OM 3, and RCA in 2006.  No obvious angina.  5.  Active RSV infection.  Has COPD at baseline.  6.  Hypothyroidism on Synthroid .  TSH 0.461.  Continue IV amiodarone  and divided dose IV Lopressor .  Reluctant to convert to oral regimen however given prior issues with junctional bradycardia when she converted last admission.  Continue supportive measures with RSV.  She is otherwise on Zetia , hydralazine ,  and Cozaar .  For questions or updates, please contact [facility] Please consult [email or web address] for contact info under   Signed, Teddie Favre, MD  06/15/2023, 9:02 AM

## 2023-06-15 NOTE — Plan of Care (Signed)
  Problem: Acute Rehab PT Goals(only PT should resolve) Goal: Pt Will Go Supine/Side To Sit Flowsheets (Taken 06/15/2023 1556) Pt will go Supine/Side to Sit: with minimal assist Goal: Patient Will Transfer Sit To/From Stand Flowsheets (Taken 06/15/2023 1556) Patient will transfer sit to/from stand: with minimal assist Goal: Pt Will Transfer Bed To Chair/Chair To Bed Flowsheets (Taken 06/15/2023 1556) Pt will Transfer Bed to Chair/Chair to Bed: with min assist    [name] SPT

## 2023-06-16 DIAGNOSIS — I4892 Unspecified atrial flutter: Secondary | ICD-10-CM | POA: Diagnosis not present

## 2023-06-16 DIAGNOSIS — I4891 Unspecified atrial fibrillation: Secondary | ICD-10-CM | POA: Diagnosis not present

## 2023-06-16 LAB — GLUCOSE, CAPILLARY
Glucose-Capillary: 194 mg/dL — ABNORMAL HIGH (ref 70–99)
Glucose-Capillary: 268 mg/dL — ABNORMAL HIGH (ref 70–99)
Glucose-Capillary: 277 mg/dL — ABNORMAL HIGH (ref 70–99)
Glucose-Capillary: 170 mg/dL — ABNORMAL HIGH (ref 70–99)

## 2023-06-16 LAB — BASIC METABOLIC PANEL
Anion gap: 7 (ref 5–15)
BUN: 13 mg/dL (ref 8–23)
CO2: 26 mmol/L (ref 22–32)
Calcium: 7.7 mg/dL — ABNORMAL LOW (ref 8.9–10.3)
Chloride: 97 mmol/L — ABNORMAL LOW (ref 98–111)
Creatinine, Ser: 0.58 mg/dL (ref 0.44–1.00)
GFR, Estimated: >60 mL/min (ref >60)
Glucose, Bld: 165 mg/dL — ABNORMAL HIGH (ref 70–99)
Potassium: 3.4 mmol/L — ABNORMAL LOW (ref 3.5–5.1)
Sodium: 130 mmol/L — ABNORMAL LOW (ref 135–145)

## 2023-06-16 MED ORDER — LOSARTAN POTASSIUM 50 MG PO TABS
100.0000 mg | ORAL_TABLET | Freq: Every day | ORAL | Status: DC
Start: 2023-06-17 — End: 2023-06-20
  Administered 2023-06-17 – 2023-06-20 (×4): 100 mg via ORAL
  Filled 2023-06-16 (×4): qty 2

## 2023-06-16 MED ORDER — PNEUMOCOCCAL 20-VAL CONJ VACC 0.5 ML IM SUSY: 0.5000 mL | PREFILLED_SYRINGE | INTRAMUSCULAR | Status: DC

## 2023-06-16 MED ORDER — POTASSIUM CHLORIDE CRYS ER 20 MEQ PO TBCR
40.0000 meq | EXTENDED_RELEASE_TABLET | Freq: Four times a day (QID) | ORAL | Status: AC
  Administered 2023-06-16 (×2): 40 meq via ORAL
  Filled 2023-06-16 (×2): qty 2

## 2023-06-16 NOTE — Plan of Care (Signed)
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
## 2023-06-16 NOTE — TOC Progression Note (Signed)
Transition of Care [facility]) - Progression Note    Patient Details  Name: [name] MRN: [id number] Date of Birth: 1945-11-01  [facility]) CM/SW Contact  [place], [place] Phone Number: 06/16/2023, 10:54 AM  Clinical Narrative:    CSW spoke to Cammie Sickle with [facility] ALF to review PT recommendations. PT states that pt should be able to return to ALF if they are able to assist with transfers. CSW reviewed with Olegario Messier. Olegario Messier states that they assist pt with transfers and all ADLs at baseline. Olegario Messier states they are able to manage pt back at the ALF. TOC to follow.   Expected Discharge Plan: Assisted Living Barriers to Discharge: Continued Medical Work up  Expected Discharge Plan and Services In-house Referral: Clinical Social Work Discharge Planning Services: CM Consult Post Acute Care Choice: Home Health Living arrangements for the past 2 months: Assisted Living Facility                                       Social Determinants of Health (SDOH) Interventions SDOH Screenings   Food Insecurity: No Food Insecurity (06/09/2023)  Housing: Low Risk  (06/09/2023)  Transportation Needs: No Transportation Needs (06/09/2023)  Utilities: Not At Risk (06/09/2023)  Social Connections: Socially Integrated (06/10/2023)  Tobacco Use: Medium Risk (06/09/2023)  Health Literacy: High Risk (09/08/2020)   Received from [facility], [facility]    Readmission Risk Interventions    06/10/2023   12:55 PM 04/21/2023    2:15 PM 04/21/2023    2:13 PM  Readmission Risk Prevention Plan  Transportation Screening Complete Complete Complete  PCP or Specialist Appt within 3-5 Days   Not Complete  HRI or Home Care Consult  Complete Complete  Social Work Consult for Recovery Care Planning/Counseling  Complete Complete  Palliative Care Screening  Not Applicable Not Applicable  Medication Review Oceanographer) Complete Complete Complete  HRI or Home Care Consult Complete     SW Recovery Care/Counseling Consult Complete    Palliative Care Screening Not Applicable    Skilled Nursing Facility Not Applicable

## 2023-06-16 NOTE — Progress Notes (Signed)
Progress Note  Patient Name: [name] Date of Encounter: 06/16/2023  Primary Cardiologist: Dina Rich, MD  Interval Summary   Pt breathing stable   No CP   Comfortable in bed   Vital Signs    Vitals:   06/16/23 0500 06/16/23 0751 06/16/23 0829 06/16/23 0900  BP: (!) 140/71  118/82 137/79  Pulse: (!) 113   (!) 111  Resp:      Temp:  98.1 F (36.7 C)    TempSrc:  Oral    SpO2: 98%   100%  Weight:      Height:        Intake/Output Summary (Last 24 hours) at 06/16/2023 1029 Last data filed at 06/16/2023 1024 Gross per 24 hour  Intake 480 ml  Output --  Net 480 ml   I/O  +2 L   Filed Weights   06/12/23 0530 06/15/23 0500  Weight: 80.5 kg 81.2 kg    Physical Exam   GEN:  Obese 78 yo in NAD  Neck: Unable to assess JVP   Cardiac:   Irreg irreg  No  S3  No significant murmurs  Respiratory:  Mild rhonci   GI: Soft, nontender, obese MS: Tr edema.  ECG/Telemetry    Telemetry reviewed showing atrial fibrillation, heart rate 100s to 130s   Labs    Chemistry Recent Labs  Lab 06/13/23 0300 06/14/23 0356 06/15/23 0457 06/16/23 0558  NA 127* 132* 132* 130*  K 3.5 3.6 4.0 3.4*  CL 93* 97* 98 97*  CO2 23 26 24 26   GLUCOSE 222* 137* 150* 165*  BUN 15 13 13 13   CREATININE 0.60 0.56 0.58 0.58  CALCIUM 7.5* 8.0* 8.1* 7.7*  PROT 5.2* 5.6* 5.3*  --   ALBUMIN 2.3* 2.4* 2.2*  --   AST 94* 84* 77*  --   ALT 41 39 36  --   ALKPHOS 87 89 98  --   BILITOT 1.3* 1.3* 1.1  --   GFRNONAA >60 >60 >60 >60  ANIONGAP 11 9 10 7     Hematology Recent Labs  Lab 06/13/23 0300 06/14/23 0356 06/15/23 0457  WBC 2.0* 2.3* 2.3*  RBC 2.59* 2.73* 2.64*  HGB 9.6* 9.8* 9.6*  HCT 28.8* 30.1* 28.8*  MCV 111.2* 110.3* 109.1*  MCH 37.1* 35.9* 36.4*  MCHC 33.3 32.6 33.3  RDW 20.7* 20.6* 20.6*  PLT 154 172 172   Cardiac Enzymes Recent Labs  Lab 06/09/23 1322 06/09/23 1601  TROPONINIHS 43* 37*   Lipid Panel     Component Value Date/Time   CHOL 116 07/25/2012  0815   TRIG 96 07/25/2012 0815   HDL 38 (L) 07/25/2012 0815   CHOLHDL 3.1 07/25/2012 0815   VLDL 19 07/25/2012 0815   LDLCALC 59 07/25/2012 0815    Assessment & Plan   1.  Paroxysmal to persistent atrial fibrillation with RVR.  CHA2DS2-VASc score is 7.   Currently on IV amiodarone along with Eliquis.  Has history of prior junctional bradycardia following conversion on prior admission.  Tolerating standing dose of IV Lopressor. I would recomm increasing amiodarone to 60 mg / hour  since rates aren't controlled   4.  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 Volume status overall appears ok though exam is limiting   4.  CAD s/p BMS to the circumflex in 2003 as well as DES to the circumflex, OM 3, and RCA in 2006.  No symptoms of angina.  5.  HL  Keep on Zetia  Not on statins due to LFT elevation    (hx NASH and autoimmune hepatitis    Will follow as outpt  6  HTN   Cont hydralazine and Cozaar  Would increase cozaar to 100 daily for better control    7 Pulmonary   Current RSV infection in setting of COPD On prednisone     For questions or updates, please contact  [facility] Please consult [email or web address] for contact info under   Signed, Dietrich Pates, MD  06/16/2023, 10:29 AM

## 2023-06-16 NOTE — Progress Notes (Signed)
Nutrition Brief Note  Patient identified on the Malnutrition Screening Tool (MST) Report.  Weight history reviewed. No significant weight changes noted within the past year.  Body mass index is 33.82 kg/m. Patient meets criteria for obesity based on current BMI.   Current diet order is carbohydrate modified, patient is consuming approximately 75-80% of meals at this time. Labs and medications reviewed. Patient is not drinking the boost breeze supplements. Since she is eating well, PO supplements not needed at this time, will discontinue.  No nutrition interventions warranted at this time. If nutrition issues arise, please consult RD.   Gabriel Rainwater RD, LDN, CNSC Contact Inpatient RD using Secure Chat. If unavailable, use group chat "RD Inpatient" via Secure Chat in EPIC.

## 2023-06-17 DIAGNOSIS — I4891 Unspecified atrial fibrillation: Secondary | ICD-10-CM | POA: Diagnosis not present

## 2023-06-17 DIAGNOSIS — I4892 Unspecified atrial flutter: Secondary | ICD-10-CM | POA: Diagnosis not present

## 2023-06-17 LAB — CBC
HCT: 27.7 % — ABNORMAL LOW (ref 36.0–46.0)
Hemoglobin: 9.3 g/dL — ABNORMAL LOW (ref 12.0–15.0)
MCH: 37.2 pg — ABNORMAL HIGH (ref 26.0–34.0)
MCHC: 33.6 g/dL (ref 30.0–36.0)
MCV: 110.8 fL — ABNORMAL HIGH (ref 80.0–100.0)
Platelets: 153 10*3/uL (ref 150–400)
RBC: 2.5 MIL/uL — ABNORMAL LOW (ref 3.87–5.11)
RDW: 20.8 % — ABNORMAL HIGH (ref 11.5–15.5)
WBC: 2.3 10*3/uL — ABNORMAL LOW (ref 4.0–10.5)
nRBC: 0 % (ref 0.0–0.2)

## 2023-06-17 LAB — BASIC METABOLIC PANEL
Anion gap: 7 (ref 5–15)
BUN: 14 mg/dL (ref 8–23)
CO2: 25 mmol/L (ref 22–32)
Calcium: 7.5 mg/dL — ABNORMAL LOW (ref 8.9–10.3)
Chloride: 98 mmol/L (ref 98–111)
Creatinine, Ser: 0.62 mg/dL (ref 0.44–1.00)
GFR, Estimated: >60 mL/min (ref >60)
Glucose, Bld: 163 mg/dL — ABNORMAL HIGH (ref 70–99)
Potassium: 4.2 mmol/L (ref 3.5–5.1)
Sodium: 130 mmol/L — ABNORMAL LOW (ref 135–145)

## 2023-06-17 LAB — GLUCOSE, CAPILLARY
Glucose-Capillary: 151 mg/dL — ABNORMAL HIGH (ref 70–99)
Glucose-Capillary: 162 mg/dL — ABNORMAL HIGH (ref 70–99)
Glucose-Capillary: 172 mg/dL — ABNORMAL HIGH (ref 70–99)
Glucose-Capillary: 204 mg/dL — ABNORMAL HIGH (ref 70–99)
Glucose-Capillary: 172 mg/dL — ABNORMAL HIGH (ref 70–99)

## 2023-06-17 NOTE — Care Management Important Message (Signed)
Important Message  Patient Details  Name: [name] MRN: [id number] Date of Birth: 1946/01/03   Important Message Given:  Yes - Medicare IM (verbally explained letter with sister Raynelle Fanning, no additional copy needed)     Corey Harold 06/17/2023, 4:53 PM

## 2023-06-17 NOTE — Evaluation (Signed)
Occupational Therapy Evaluation Patient Details Name: [name] MRN: [id number] DOB: 1945-12-23 Today's Date: 06/17/2023   History of Present Illness [name]  is a 78 y.o. female,   with a history of COPD, room air at baseline, hypertension, hyperlipidemia, hypothyroidism, CAD s/p stent placement, GERD, diabetes mellitus type 2, blindness, obesity.  She is wheelchair dependent for last year.  Sisters at bedside,  Pt is SNF resident, she was brought by the facility for dyspnea, hypoxia, at baseline patient with oxygen requirement, she was noted by facility to be dyspneic today, with hypoxia 80% on room air, EMS were called she was found to be in A-fib with heart rate in the 130s, requiring oxygen 2 L which brings her saturation up to 90%, denies any chest pain, fever, chills, cough, she does report vomiting couple times today, she denies any abdominal pain, diarrhea .   Clinical Impression   Pt agreeable to OT evaluation. Pt reports that she came from ALF. Pt reports that she completed all ADLS independently prior to admission- unsure if this is true.. Pt declined bed mobility and OOB mobility at this time due to "begin worn out". Pt unable to perform full ROM with UE and demonstrated generalized weakness throughout BL UE. PT would benefit from continued OT services in acute care setting.       If plan is discharge home, recommend the following: A lot of help with walking and/or transfers;A lot of help with bathing/dressing/bathroom;Assistance with cooking/housework;Assist for transportation;Help with stairs or ramp for entrance    Functional Status Assessment  Patient has had a recent decline in their functional status and demonstrates the ability to make significant improvements in function in a reasonable and predictable amount of time.  Equipment Recommendations  None recommended by OT       Precautions / Restrictions Precautions Precautions: Fall Restrictions Weight Bearing  Restrictions Per Provider Order: No      Mobility Bed Mobility Overal bed mobility: Needs Assistance             General bed mobility comments: pt declined bed mobility at this time    Transfers Overall transfer level: Needs assistance                 General transfer comment: pt declined OOB mobility at this time      Balance Overall balance assessment: Needs assistance                                         ADL either performed or assessed with clinical judgement   ADL Overall ADL's : Needs assistance/impaired Eating/Feeding: Set up;Bed level;Minimal assistance   Grooming: Moderate assistance;Bed level   Upper Body Bathing: Maximal assistance;Moderate assistance;Bed level   Lower Body Bathing: Maximal assistance;Bed level   Upper Body Dressing : Moderate assistance;Bed level   Lower Body Dressing: Maximal assistance;Bed level   Toilet Transfer: Maximal assistance   Toileting- Clothing Manipulation and Hygiene: Maximal assistance;Bed level   Tub/ Shower Transfer: Maximal assistance   Functional mobility during ADLs: Wheelchair       Vision Baseline Vision/History: 2 Legally blind Ability to See in Adequate Light: 4 Severely impaired Patient Visual Report: No change from baseline Vision Assessment?: Vision impaired- to be further tested in functional context (legally blind) Additional Comments: legally blind            Pertinent Vitals/Pain Pain Assessment Pain  Assessment: No/denies pain     Extremity/Trunk Assessment Upper Extremity Assessment Upper Extremity Assessment: Right hand dominant;LUE deficits/detail;RUE deficits/detail;Generalized weakness RUE Deficits / Details: 50% ROM with shoulder flexion, allows OT to passivley move UE into full ROM RUE: Shoulder pain with ROM RUE Sensation: WNL LUE Deficits / Details: 50% ROM shoulder flexion, allows OT to move fully in PROM LUE: Shoulder pain with ROM LUE Sensation:  WNL   Lower Extremity Assessment Lower Extremity Assessment: Defer to PT evaluation   Cervical / Trunk Assessment Cervical / Trunk Assessment: Normal   Communication Communication Communication: No apparent difficulties Cueing Techniques: Verbal cues;Tactile cues   Cognition Arousal: Alert Behavior During Therapy: WFL for tasks assessed/performed Overall Cognitive Status: Within Functional Limits for tasks assessed                                                        Home Living Family/patient expects to be discharged to:: Assisted living                             Home Equipment: Wheelchair - manual   Additional Comments: Patient stating she can ambulate, patient questionable hisorian as chart stating mostly transfers to [facility]. Per pt, she completed all ADLs independently at facility      Prior Functioning/Environment Prior Level of Function : Needs assist       Physical Assist : Mobility (physical);ADLs (physical) Mobility (physical): Bed mobility;Transfers;Gait ADLs (physical): Bathing;Dressing;Toileting;Feeding;Grooming;IADLs Mobility Comments: Supervised transfers to wheelchair, uses w/c for mobility ADLs Comments: Assisted by ALF staff        OT Problem List: Decreased range of motion;Decreased strength;Decreased activity tolerance;Impaired balance (sitting and/or standing)      OT Treatment/Interventions: Self-care/ADL training;Therapeutic exercise;DME and/or AE instruction;Therapeutic activities;Patient/family education    OT Goals(Current goals can be found in the care plan section) Acute Rehab OT Goals Patient Stated Goal: return to rehab\ OT Goal Formulation: With patient Time For Goal Achievement: 07/01/23 Potential to Achieve Goals: Fair ADL Goals Pt Will Perform Grooming: sitting;Independently Pt Will Perform Lower Body Bathing: with supervision;sitting/lateral leans Pt Will Perform Lower Body Dressing: with  supervision;with adaptive equipment;sitting/lateral leans Pt Will Transfer to Toilet: with min assist;bedside commode Pt/caregiver will Perform Home Exercise Program: Increased strength;Increased ROM;Both right and left upper extremity;With written HEP provided  OT Frequency: Min 2X/week       AM-PAC OT "6 Clicks" Daily Activity     Outcome Measure Help from another person eating meals?: A Little Help from another person taking care of personal grooming?: A Little Help from another person toileting, which includes using toliet, bedpan, or urinal?: A Lot Help from another person bathing (including washing, rinsing, drying)?: A Lot Help from another person to put on and taking off regular upper body clothing?: A Lot Help from another person to put on and taking off regular lower body clothing?: A Lot 6 Click Score: 14   End of Session Nurse Communication: Mobility status  Activity Tolerance: Patient limited by fatigue Patient left: in bed;with call bell/phone within reach  OT Visit Diagnosis: Muscle weakness (generalized) (M62.81);Unsteadiness on feet (R26.81)                Time: 6578-4696 OT Time Calculation (min): 13 min Charges:  OT General Charges $OT Visit:  1 Visit OT Evaluation $OT Eval Low Complexity: 1 Low    Bevelyn Ngo, OTR/L 06/17/2023, 1:54 PM

## 2023-06-17 NOTE — Progress Notes (Signed)
Rounding Note    Patient Name: [name] Date of Encounter: 06/17/2023  [facility] Cardiologist: Dina Rich, MD   Subjective   Some ongoing cough  Inpatient Medications    Scheduled Meds:  amitriptyline  50 mg Oral QHS   apixaban  5 mg Oral BID   azaTHIOprine  150 mg Oral Daily   Chlorhexidine Gluconate Cloth  6 each Topical Q0600   ezetimibe  10 mg Oral Daily   hydrALAZINE  50 mg Oral TID   insulin aspart  0-5 Units Subcutaneous QHS   insulin aspart  0-9 Units Subcutaneous TID WC   levothyroxine  100 mcg Oral QAC breakfast   losartan  100 mg Oral Daily   metoprolol tartrate  2.5 mg Intravenous Q6H   mouth rinse  15 mL Mouth Rinse 4 times per day   pneumococcal 20-valent conjugate vaccine  0.5 mL Intramuscular Tomorrow-1000   predniSONE  10 mg Oral Daily   senna-docusate  1 tablet Oral Daily   sertraline  50 mg Oral Daily   sodium chloride flush  10-40 mL Intracatheter Q12H   Continuous Infusions:  amiodarone 60 mg/hr (06/16/23 2120)   PRN Meds: acetaminophen **OR** acetaminophen, HYDROcodone bit-homatropine, levalbuterol, ondansetron **OR** ondansetron (ZOFRAN) IV, mouth rinse, sodium chloride flush   Vital Signs    Vitals:   06/17/23 0300 06/17/23 0400 06/17/23 0500 06/17/23 0655  BP: 111/72 126/83 123/76 (!) 103/54  Pulse: (!) 103 94 (!) 102 91  Resp:  19 16 15   Temp:    98.3 F (36.8 C)  TempSrc:    Oral  SpO2: 96% 97% 98% 96%  Weight:      Height:        Intake/Output Summary (Last 24 hours) at 06/17/2023 0758 Last data filed at 06/17/2023 0700 Gross per 24 hour  Intake 840 ml  Output 700 ml  Net 140 ml      06/15/2023    5:00 AM 06/12/2023    5:30 AM 04/28/2023    5:00 AM  Last 3 Weights  Weight (lbs) 179 lb 0.2 oz 177 lb 7.5 oz 176 lb 9.4 oz  Weight (kg) 81.2 kg 80.5 kg 80.1 kg      Telemetry    Afib 100s-120s - Personally Reviewed  ECG    N/a - Personally Reviewed  Physical Exam   GEN: No acute distress.    Neck: No JVD Cardiac: irreg Respiratory: Clear to auscultation bilaterally. GI: Soft, nontender, non-distended  MS: No edema; No deformity. Neuro:  Nonfocal  Psych: Normal affect   Labs    High Sensitivity Troponin:   Recent Labs  Lab 06/09/23 1322 06/09/23 1601  TROPONINIHS 43* 37*     Chemistry Recent Labs  Lab 06/13/23 0300 06/14/23 0356 06/15/23 0457 06/16/23 0558 06/17/23 0627  NA 127* 132* 132* 130* 130*  K 3.5 3.6 4.0 3.4* 4.2  CL 93* 97* 98 97* 98  CO2 23 26 24 26 25   GLUCOSE 222* 137* 150* 165* 163*  BUN 15 13 13 13 14   CREATININE 0.60 0.56 0.58 0.58 0.62  CALCIUM 7.5* 8.0* 8.1* 7.7* 7.5*  MG 1.8 1.7 1.7  --   --   PROT 5.2* 5.6* 5.3*  --   --   ALBUMIN 2.3* 2.4* 2.2*  --   --   AST 94* 84* 77*  --   --   ALT 41 39 36  --   --   ALKPHOS 87 89 98  --   --  BILITOT 1.3* 1.3* 1.1  --   --   GFRNONAA >60 >60 >60 >60 >60  ANIONGAP 11 9 10 7 7     Lipids No results for input(s): "CHOL", "TRIG", "HDL", "LABVLDL", "LDLCALC", "CHOLHDL" in the last 168 hours.  Hematology Recent Labs  Lab 06/14/23 0356 06/15/23 0457 06/17/23 0627  WBC 2.3* 2.3* 2.3*  RBC 2.73* 2.64* 2.50*  HGB 9.8* 9.6* 9.3*  HCT 30.1* 28.8* 27.7*  MCV 110.3* 109.1* 110.8*  MCH 35.9* 36.4* 37.2*  MCHC 32.6 33.3 33.6  RDW 20.6* 20.6* 20.8*  PLT 172 172 153   Thyroid  Recent Labs  Lab 06/12/23 0815  FREET4 1.33*    BNPNo results for input(s): "BNP", "PROBNP" in the last 168 hours.  DDimer No results for input(s): "DDIMER" in the last 168 hours.   Radiology    No results found.  Cardiac Studies     Patient Profile     [name] is a 78 y.o. female with a hx of CAD (s/p BMS to LCx in 2003, DESx3 to LCx, OM3 and RCA in 2006), HTN, HFmrEF, HLD, Type 2 DM and paroxysmal atrial fibrillation (diagnosed in 08/2022 during admission for PNA who is being seen 06/09/2023 for the evaluation of tachycardia at the request of Dr Estell Harpin.   Assessment & Plan  1.PAF -history of PAF -  recent admission with afib with RVR, failed rate control. Started on amiodarone and converted - recurrent afib with RVR, likely in setting of URI. Also N/V perhaps troubles keeping meds down at home. Restarted IV amiodarone on admission - of note some junctional bradycardia after conversion during last admission. Careful with av nodal agents   -remains on amio gtt, yesterday rate increased to 60mg /hr. Has been started on IV lopressor 2.5mg  every 6 hours - continue amio at 60mg /hr additional 24 hrs then back to 30mg /hr tomorrow.  - rhtyhm and rates should improve as RSV resolves.    2.HFmrEF - 08/2022 echo: LVEF 50-55% - 04/2023 echo limited: LVEF 45-50% - slight decline in LVEF thought to be rate related last admission - does not appear volume overloaded this admission   no beta blocker now given bradycardia  - WIth low normal to mildly reduced LVEF likely tachy mediated I would not neccesarily commit to aggressive extensive  long term HFrEF medical regimen. - with wide complex tachycardia on admission repeat limited echo when better rate controlled.    3. CAD - She is s/p BMS to LCx in 2003 and DESx3 to LCx, OM3 and RCA in 2006    4. Wide complex tachycardia - looks like runs of NSVT and VT, asymptomatically and hemodynamically stable.  - resolved with starting IV amiodarone - once rate controlled repeat limited echo - poor candidate to consider ischemic testing due to poor functional capacity, blind, primarily bed bound, advanced age     66.HTN - restart home losartan, hydralazine.    6. RSV For questions or updates, please contact [facility] Please consult [email or web address] for contact info under        Signed, Dina Rich, MD  06/17/2023, 7:58 AM

## 2023-06-17 NOTE — Plan of Care (Signed)
  Problem: Education: Goal: Knowledge of General Education information will improve Description: Including pain rating scale, medication(s)/side effects and non-pharmacologic comfort measures Outcome: Progressing   Problem: Elimination: Goal: Will not experience complications related to urinary retention Outcome: Progressing   Problem: Pain Management: Goal: General experience of comfort will improve Outcome: Progressing

## 2023-06-17 NOTE — Progress Notes (Signed)
Physical Therapy Treatment Patient Details Name: [name] MRN: [id number] DOB: 11/09/1945 Today's Date: 06/17/2023   History of Present Illness [name]  is a 78 y.o. female,   with a history of COPD, room air at baseline, hypertension, hyperlipidemia, hypothyroidism, CAD s/p stent placement, GERD, diabetes mellitus type 2, blindness, obesity.  She is wheelchair dependent for last year.  Sisters at bedside,  Pt is SNF resident, she was brought by the facility for dyspnea, hypoxia, at baseline patient with oxygen requirement, she was noted by facility to be dyspneic today, with hypoxia 80% on room air, EMS were called she was found to be in A-fib with heart rate in the 130s, requiring oxygen 2 L which brings her saturation up to 90%, denies any chest pain, fever, chills, cough, she does report vomiting couple times today, she denies any abdominal pain, diarrhea .    PT Comments  Pt was in bed at beginning of session and was agreeable to therapy. Patient required verbal/tactile cueing for bed mobility & did required mod assist for going supine to sit. Patient was able to sit EOB feet unsupported with the use of BUE for support. Patient was able to complete most exercises independently. Did perform AAROM for Marching and Long Arc Quads on BLE. BP was taken seated at the beginning and conclusion of exercises. Patient was not transferred to chair due to low BP. BP numbers were given to the nurse following the conclusion of the treatment session. Patient was helped back into supine from seated. Patient was able to provide assistance to PT by bending knees to push them up higher in the bed.  Patient will benefit from continued skilled physical therapy in hospital and recommended venue below to increase strength, balance, endurance for safe ADLs and gait.     If plan is discharge home, recommend the following: A lot of help with bathing/dressing/bathroom;A lot of help with walking and/or  transfers;Help with stairs or ramp for entrance;Assistance with cooking/housework   Can travel by private vehicle     No  Equipment Recommendations  None recommended by PT    Recommendations for Other Services       Precautions / Restrictions Precautions Precautions: Fall Restrictions Weight Bearing Restrictions Per Provider Order: No     Mobility  Bed Mobility Overal bed mobility: Needs Assistance Bed Mobility: Supine to Sit, Sit to Supine     Supine to sit: Mod assist Sit to supine: Mod assist     Patient Response: Cooperative  Transfers Overall transfer level: Needs assistance Equipment used: Rolling walker (2 wheels) Transfers: Sit to/from Stand Sit to Stand: Mod assist                Ambulation/Gait                   Stairs             Wheelchair Mobility     Tilt Bed Tilt Bed Patient Response: Cooperative  Modified Rankin (Stroke Patients Only)       Balance Overall balance assessment: Needs assistance Sitting-balance support: Feet unsupported, Bilateral upper extremity supported Sitting balance-Leahy Scale: Good Sitting balance - Comments: was a little unsteady but was able to maintain balance                                    Cognition Arousal: Alert Behavior During Therapy: [facility] for tasks  assessed/performed Overall Cognitive Status: Within Functional Limits for tasks assessed                                          Exercises General Exercises - Lower Extremity Ankle Circles/Pumps: Seated, AROM, Both, 10 reps Long Arc Quad: Seated, AROM, Both, 10 reps Hip Flexion/Marching: Seated, AAROM, Both, 10 reps    General Comments        Pertinent Vitals/Pain Pain Assessment Pain Assessment: No/denies pain    Home Living                          Prior Function            PT Goals (current goals can now be found in the care plan section) Acute Rehab PT Goals Patient  Stated Goal: Return to home PT Goal Formulation: With patient Time For Goal Achievement: 06/29/23 Potential to Achieve Goals: Good Progress towards PT goals: Progressing toward goals    Frequency    Min 3X/week      PT Plan      Co-evaluation              AM-PAC PT "6 Clicks" Mobility   Outcome Measure  Help needed turning from your back to your side while in a flat bed without using bedrails?: A Lot Help needed moving from lying on your back to sitting on the side of a flat bed without using bedrails?: A Lot Help needed moving to and from a bed to a chair (including a wheelchair)?: A Lot Help needed standing up from a chair using your arms (e.g., wheelchair or bedside chair)?: A Lot Help needed to walk in hospital room?: Total Help needed climbing 3-5 steps with a railing? : Total 6 Click Score: 10    End of Session   Activity Tolerance: Patient limited by fatigue;Other (comment) Patient left: in bed;with call bell/phone within reach Nurse Communication: Mobility status PT Visit Diagnosis: Unsteadiness on feet (R26.81);Other abnormalities of gait and mobility (R26.89);Muscle weakness (generalized) (M62.81);History of falling (Z91.81)     Time: 1478-2956 PT Time Calculation (min) (ACUTE ONLY): 27 min  Charges:    $Therapeutic Exercise: 8-22 mins $Therapeutic Activity: 8-22 mins PT General Charges $$ ACUTE PT VISIT: 1 Visit                     Shagun Wordell SPT

## 2023-06-17 NOTE — Plan of Care (Signed)
  Problem: Acute Rehab OT Goals (only OT should resolve) Goal: Pt. Will Perform Grooming Flowsheets (Taken 06/17/2023 1352) Pt Will Perform Grooming:  sitting  Independently Goal: Pt. Will Perform Lower Body Bathing Flowsheets (Taken 06/17/2023 1352) Pt Will Perform Lower Body Bathing:  with supervision  sitting/lateral leans Goal: Pt. Will Perform Lower Body Dressing Flowsheets (Taken 06/17/2023 1352) Pt Will Perform Lower Body Dressing:  with supervision  with adaptive equipment  sitting/lateral leans Goal: Pt. Will Transfer To Toilet Flowsheets (Taken 06/17/2023 1352) Pt Will Transfer to Toilet:  with min assist  bedside commode Goal: Pt/Caregiver Will Perform Home Exercise Program Flowsheets (Taken 06/17/2023 1352) Pt/caregiver will Perform Home Exercise Program:  Increased strength  Increased ROM  Both right and left upper extremity  With written HEP provided  Lurena Joiner, OTR/L
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
## 2023-06-18 DIAGNOSIS — I4891 Unspecified atrial fibrillation: Secondary | ICD-10-CM | POA: Diagnosis not present

## 2023-06-18 LAB — GLUCOSE, CAPILLARY
Glucose-Capillary: 131 mg/dL — ABNORMAL HIGH (ref 70–99)
Glucose-Capillary: 188 mg/dL — ABNORMAL HIGH (ref 70–99)
Glucose-Capillary: 229 mg/dL — ABNORMAL HIGH (ref 70–99)
Glucose-Capillary: 195 mg/dL — ABNORMAL HIGH (ref 70–99)

## 2023-06-18 NOTE — Plan of Care (Signed)
  Problem: Clinical Measurements: Goal: Ability to maintain clinical measurements within normal limits will improve Outcome: Progressing Goal: Respiratory complications will improve Outcome: Progressing   Problem: Nutrition: Goal: Adequate nutrition will be maintained Outcome: Progressing   Problem: Pain Management: Goal: General experience of comfort will improve Outcome: Progressing   Problem: Safety: Goal: Ability to remain free from injury will improve Outcome: Progressing
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
## 2023-06-19 DIAGNOSIS — I4891 Unspecified atrial fibrillation: Secondary | ICD-10-CM | POA: Diagnosis not present

## 2023-06-19 LAB — BASIC METABOLIC PANEL
Anion gap: 9 (ref 5–15)
BUN: 13 mg/dL (ref 8–23)
CO2: 19 mmol/L — ABNORMAL LOW (ref 22–32)
Calcium: 7.6 mg/dL — ABNORMAL LOW (ref 8.9–10.3)
Chloride: 103 mmol/L (ref 98–111)
Creatinine, Ser: 0.6 mg/dL (ref 0.44–1.00)
GFR, Estimated: >60 mL/min (ref >60)
Glucose, Bld: 127 mg/dL — ABNORMAL HIGH (ref 70–99)
Potassium: 4.3 mmol/L (ref 3.5–5.1)
Sodium: 131 mmol/L — ABNORMAL LOW (ref 135–145)

## 2023-06-19 LAB — CBC
HCT: 30.9 % — ABNORMAL LOW (ref 36.0–46.0)
Hemoglobin: 10.3 g/dL — ABNORMAL LOW (ref 12.0–15.0)
MCH: 37.3 pg — ABNORMAL HIGH (ref 26.0–34.0)
MCHC: 33.3 g/dL (ref 30.0–36.0)
MCV: 112 fL — ABNORMAL HIGH (ref 80.0–100.0)
Platelets: 153 10*3/uL (ref 150–400)
RBC: 2.76 MIL/uL — ABNORMAL LOW (ref 3.87–5.11)
RDW: 20.4 % — ABNORMAL HIGH (ref 11.5–15.5)
WBC: 2.6 10*3/uL — ABNORMAL LOW (ref 4.0–10.5)
nRBC: 0 % (ref 0.0–0.2)

## 2023-06-19 LAB — GLUCOSE, CAPILLARY
Glucose-Capillary: 123 mg/dL — ABNORMAL HIGH (ref 70–99)
Glucose-Capillary: 210 mg/dL — ABNORMAL HIGH (ref 70–99)
Glucose-Capillary: 285 mg/dL — ABNORMAL HIGH (ref 70–99)
Glucose-Capillary: 194 mg/dL — ABNORMAL HIGH (ref 70–99)

## 2023-06-19 MED ORDER — AMIODARONE HCL 200 MG PO TABS
200.0000 mg | ORAL_TABLET | Freq: Two times a day (BID) | ORAL | Status: DC
  Administered 2023-06-19 – 2023-06-20 (×3): 200 mg via ORAL
  Filled 2023-06-19 (×3): qty 1
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
## 2023-06-19 NOTE — Plan of Care (Signed)
  Problem: Education: Goal: Knowledge of General Education information will improve Description: Including pain rating scale, medication(s)/side effects and non-pharmacologic comfort measures Outcome: Progressing   Problem: Elimination: Goal: Will not experience complications related to urinary retention Outcome: Progressing   Problem: Safety: Goal: Ability to remain free from injury will improve Outcome: Progressing   Problem: Skin Integrity: Goal: Risk for impaired skin integrity will decrease Outcome: Progressing   

## 2023-06-19 NOTE — Plan of Care (Signed)
  Problem: Education: Goal: Knowledge of General Education information will improve Description: Including pain rating scale, medication(s)/side effects and non-pharmacologic comfort measures Outcome: Progressing   Problem: Clinical Measurements: Goal: Will remain free from infection Outcome: Progressing   

## 2023-06-20 ENCOUNTER — Other Ambulatory Visit: Payer: Self-pay | Admitting: *Deleted

## 2023-06-20 DIAGNOSIS — I2510 Atherosclerotic heart disease of native coronary artery without angina pectoris: Secondary | ICD-10-CM

## 2023-06-20 DIAGNOSIS — I5022 Chronic systolic (congestive) heart failure: Secondary | ICD-10-CM

## 2023-06-20 DIAGNOSIS — I4891 Unspecified atrial fibrillation: Secondary | ICD-10-CM | POA: Diagnosis not present

## 2023-06-20 DIAGNOSIS — I4892 Unspecified atrial flutter: Secondary | ICD-10-CM | POA: Diagnosis not present

## 2023-06-20 DIAGNOSIS — I10 Essential (primary) hypertension: Secondary | ICD-10-CM

## 2023-06-20 DIAGNOSIS — R000 Tachycardia, unspecified: Secondary | ICD-10-CM

## 2023-06-20 DIAGNOSIS — R946 Abnormal results of thyroid function studies: Secondary | ICD-10-CM

## 2023-06-20 LAB — GLUCOSE, CAPILLARY
Glucose-Capillary: 120 mg/dL — ABNORMAL HIGH (ref 70–99)
Glucose-Capillary: 189 mg/dL — ABNORMAL HIGH (ref 70–99)

## 2023-06-20 LAB — BASIC METABOLIC PANEL
Anion gap: 9 (ref 5–15)
BUN: 13 mg/dL (ref 8–23)
CO2: 25 mmol/L (ref 22–32)
Calcium: 8 mg/dL — ABNORMAL LOW (ref 8.9–10.3)
Chloride: 98 mmol/L (ref 98–111)
Creatinine, Ser: 0.57 mg/dL (ref 0.44–1.00)
GFR, Estimated: >60 mL/min (ref >60)
Glucose, Bld: 127 mg/dL — ABNORMAL HIGH (ref 70–99)
Potassium: 4.1 mmol/L (ref 3.5–5.1)
Sodium: 132 mmol/L — ABNORMAL LOW (ref 135–145)

## 2023-06-20 MED ORDER — AMIODARONE HCL 200 MG PO TABS: ORAL_TABLET | ORAL | 0 refills | Status: DC

## 2023-06-20 NOTE — Plan of Care (Signed)
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
## 2023-06-20 NOTE — ED Provider Notes (Signed)
Had bm and pericare .  EMS transported patient with belongings back to [facility].

## 2023-06-20 NOTE — Progress Notes (Addendum)
Message relayed to office nurse Bradly Bienenstock to help assist in putting in orders for thyroid studies and limited echo prior to f/u appointment with Dr. Wyline Mood in February. Outlined instructions on patient AVS to come in 2/20 to have these done.

## 2023-06-20 NOTE — Progress Notes (Signed)
Progress Note  Patient Name: [name] Date of Encounter: 06/20/2023  Primary Cardiologist: Dina Rich, MD  Subjective   No acute events overnight, normal sinus rhythm on telemetry.   Inpatient Medications    Scheduled Meds:  amiodarone  200 mg Oral BID   amitriptyline  50 mg Oral QHS   apixaban  5 mg Oral BID   azaTHIOprine  150 mg Oral Daily   Chlorhexidine Gluconate Cloth  6 each Topical Q0600   ezetimibe  10 mg Oral Daily   hydrALAZINE  50 mg Oral TID   insulin aspart  0-5 Units Subcutaneous QHS   insulin aspart  0-9 Units Subcutaneous TID WC   levothyroxine  100 mcg Oral QAC breakfast   losartan  100 mg Oral Daily   mouth rinse  15 mL Mouth Rinse 4 times per day   pneumococcal 20-valent conjugate vaccine  0.5 mL Intramuscular Tomorrow-1000   predniSONE  10 mg Oral Daily   senna-docusate  1 tablet Oral Daily   sertraline  50 mg Oral Daily   sodium chloride flush  10-40 mL Intracatheter Q12H   Continuous Infusions:  PRN Meds: acetaminophen **OR** acetaminophen, HYDROcodone bit-homatropine, levalbuterol, ondansetron **OR** ondansetron (ZOFRAN) IV, mouth rinse, sodium chloride flush   Vital Signs    Vitals:   06/19/23 1654 06/19/23 2114 06/20/23 0300 06/20/23 0918  BP: 132/67 100/65 (!) 112/57 (!) 121/54  Pulse: 67 70 70 70  Resp: 17 20    Temp:  (!) 97.4 F (36.3 C) 97.7 F (36.5 C)   TempSrc:  Axillary Axillary   SpO2: 100% 95% 95%   Weight:      Height:        Intake/Output Summary (Last 24 hours) at 06/20/2023 0954 Last data filed at 06/20/2023 0300 Gross per 24 hour  Intake 269.41 ml  Output 400 ml  Net -130.59 ml   Filed Weights   06/12/23 0530 06/15/23 0500 06/19/23 0351  Weight: 80.5 kg 81.2 kg 83.5 kg    Telemetry     Personally reviewed, NSR  Physical Exam   GEN: No acute distress.   Neck: No JVD. Cardiac: RRR, no murmur, rub, or gallop.  Respiratory: Nonlabored. Clear to auscultation bilaterally. GI: Soft,  nontender, bowel sounds present. MS: No edema; No deformity. Neuro:  Nonfocal. Psych: Alert and oriented x 3. Normal affect.  Labs    Chemistry Recent Labs  Lab 06/14/23 0356 06/15/23 0457 06/16/23 0558 06/17/23 0627 06/19/23 0508 06/20/23 0802  NA 132* 132*   < > 130* 131* 132*  K 3.6 4.0   < > 4.2 4.3 4.1  CL 97* 98   < > 98 103 98  CO2 26 24   < > 25 19* 25  GLUCOSE 137* 150*   < > 163* 127* 127*  BUN 13 13   < > 14 13 13   CREATININE 0.56 0.58   < > 0.62 0.60 0.57  CALCIUM 8.0* 8.1*   < > 7.5* 7.6* 8.0*  PROT 5.6* 5.3*  --   --   --   --   ALBUMIN 2.4* 2.2*  --   --   --   --   AST 84* 77*  --   --   --   --   ALT 39 36  --   --   --   --   ALKPHOS 89 98  --   --   --   --   BILITOT 1.3* 1.1  --   --   --   --  GFRNONAA >60 >60   < > >60 >60 >60  ANIONGAP 9 10   < > 7 9 9    < > = values in this interval not displayed.     Hematology Recent Labs  Lab 06/15/23 0457 06/17/23 0627 06/19/23 0654  WBC 2.3* 2.3* 2.6*  RBC 2.64* 2.50* 2.76*  HGB 9.6* 9.3* 10.3*  HCT 28.8* 27.7* 30.9*  MCV 109.1* 110.8* 112.0*  MCH 36.4* 37.2* 37.3*  MCHC 33.3 33.6 33.3  RDW 20.6* 20.8* 20.4*  PLT 172 153 153    Cardiac Enzymes Recent Labs  Lab 06/09/23 1322 06/09/23 1601  TROPONINIHS 43* 37*    BNPNo results for input(s): "BNP", "PROBNP" in the last 168 hours.   DDimerNo results for input(s): "DDIMER" in the last 168 hours.   Radiology    No results found.   Assessment & Plan    Paroxysmal A-fib : Admitted with A-fib with RVR in the setting of RSV/flu.  On amiodarone drip for rate control that was later switched to p.o. amiodarone.  Currently normal sinus rhythm, continue p.o. amiodarone 200 mg twice daily for 3 weeks followed by 200 mg once daily.  Will repeat TSH in 1 month.  She has appointment with Dr. Wyline Mood in 1 month.  HFmrEF: LVEF 45 to 50% in November 2024.  Likely rate related.  Compensated, not volume overloaded.  Now that she is in normal sinus rhythm,  will repeat limited echocardiogram in 1 month.  CAD s/p BMS to LCx in 2003 and LCx PCI, OM 3 and RCA in 2006, no chest pain.  Wide-complex tachycardia on admission: Runs of NSVT and VT, asymptomatic hemoglobin stable.  Resolved after starting IV amiodarone.  Not a candidate for ischemia testing due to poor functional capacity, blind, primarily bedbound and advanced age.  HTN, controlled: On home losartan and hydralazine.    [facility] will sign off.   Medication Recommendations: P.o. amiodarone 200 mg twice daily for 3 weeks followed by amiodarone 200 mg once daily, Eliquis 5 mg twice daily, resume home antihypertensive medications Other recommendations (labs, testing, etc): Limited echocardiogram in 1 month and TSH in 1 month Follow up as an outpatient: Keep appointment with Dr. Wyline Mood in 1 month    Signed, Marjo Bicker, MD  06/20/2023, 9:54 AM

## 2023-06-20 NOTE — TOC Transition Note (Signed)
Transition of Care [facility]) - Discharge Note   Patient Details  Name: [name] MRN: [id number] Date of Birth: 1946/05/27  Transition of Care [facility]) CM/SW Contact:  Villa Herb, LCSWA Phone Number: 06/20/2023, 2:22 PM  Clinical Narrative:    CSW updated that pt is medically stable for D/C back to [facility]F. CSW spoke to [place] with ALF who states facility has reviewed Fl2 and D/C summary CSW sent to facility fax. Olegario Messier states they are ready to accept pt back to facility at this time. RN updated of plan for D/C. EMS called for transport. TOC signing off.   Final next level of care: Assisted Living Barriers to Discharge: Barriers Resolved   Patient Goals and CMS Choice Patient states their goals for this hospitalization and ongoing recovery are:: return to ALF CMS Medicare.gov Compare Post Acute Care list provided to:: Patient Choice offered to / list presented to : Patient      Discharge Placement                Patient to be transferred to facility by: EMS Name of family member notified: sister Patient and family notified of of transfer: 06/20/23  Discharge Plan and Services Additional resources added to the After Visit Summary for   In-house Referral: Clinical Social Work Discharge Planning Services: CM Consult Post Acute Care Choice: Home Health                               Social Drivers of Health (SDOH) Interventions SDOH Screenings   Food Insecurity: No Food Insecurity (06/09/2023)  Housing: Low Risk  (06/09/2023)  Transportation Needs: No Transportation Needs (06/09/2023)  Utilities: Not At Risk (06/09/2023)  Social Connections: Socially Integrated (06/10/2023)  Tobacco Use: Medium Risk (06/09/2023)  Health Literacy: High Risk (09/08/2020)   [facility]     Readmission Risk Interventions    06/10/2023   12:55 PM 04/21/2023    2:15 PM 04/21/2023    2:13 PM  Readmission Risk Prevention Plan  Transportation  Screening Complete Complete Complete  PCP or Specialist Appt within 3-5 Days   Not Complete  HRI or Home Care Consult  Complete Complete  Social Work Consult for Recovery Care Planning/Counseling  Complete Complete  Palliative Care Screening  Not Applicable Not Applicable  Medication Review Oceanographer) Complete Complete Complete  HRI or Home Care Consult Complete    SW Recovery Care/Counseling Consult Complete    Palliative Care Screening Not Applicable    Skilled Nursing Facility Not Applicable

## 2023-06-20 NOTE — Progress Notes (Signed)
PICC removed.  Site dry and intact.  Gauze and tegaderm applied.  Gave zofran for nausea .  Sister at bedside and helping with lunch

## 2023-06-20 NOTE — [place] FL2 (Signed)
[place] MEDICAID FL2 LEVEL OF CARE FORM     IDENTIFICATION  Patient Name: [name] Birthdate: [birth year] Sex: female Admission Date (Current Location): 06/09/2023  [facility] and [place] Number:  [employer] and Address:  [facility],  [address], [place] [address]      Provider Number: [id number]  Attending Physician Name and Address:  Tyrone Nine, MD  Relative Name and Phone Number:       Current Level of Care: Hospital Recommended Level of Care: Assisted Living Facility Prior Approval Number:    Date Approved/Denied:   PASRR Number:    Discharge Plan: Other (Comment) (ALF)    Current Diagnoses: Patient Active Problem List   Diagnosis Date Noted   Atrial flutter with rapid ventricular response (HCC) 06/09/2023   Hypoglycemia 04/25/2023   Atrial fibrillation with RVR (HCC) 04/20/2023   AMS (altered mental status) 02/24/2023   Paroxysmal atrial fibrillation with RVR (HCC) 09/23/2022   Poisoning by unspecified narcotics, intentional self-harm, initial encounter (HCC) 09/22/2022   Pneumonia 09/22/2022   Loose stools 09/14/2022   Bimalleolar fracture of right ankle 06/29/2021   Hypokalemia 06/29/2021   Hyperglycemia due to diabetes mellitus (HCC) 06/29/2021   Insomnia 06/29/2021   Diabetic neuropathy (HCC) 06/29/2021   COVID-19 virus infection 06/29/2021   Autoimmune hepatitis (HCC) 01/20/2021   Elevated LFTs 12/11/2020   NASH (nonalcoholic steatohepatitis) 12/11/2020   Acute lower UTI 06/18/2018   Elevated troponin 06/18/2018   UTI (urinary tract infection) 06/18/2018   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) 06/21/2017   Mastalgia 06/21/2017   Acute diastolic CHF (congestive heart failure) (HCC) 12/20/2016   Adrenal insufficiency (HCC) 12/18/2016   Lobar pneumonia (HCC) 12/15/2016   Acute metabolic encephalopathy 12/15/2016   Thrombocytopenia (HCC) 12/15/2016   CKD (chronic kidney disease) stage 3, GFR 30-59 ml/min  (HCC) 12/15/2016   Sepsis (HCC) 12/14/2016   COPD exacerbation (HCC) 04/24/2016   DM type 2 (diabetes mellitus, type 2) (HCC) 04/24/2016   Aortic atherosclerosis (HCC) 04/24/2016   Transaminitis 10/20/2014   Acute bronchitis 10/20/2014   Acute respiratory failure (HCC) 10/17/2014   IBS (irritable bowel syndrome) 08/07/2012   Lower abdominal pain 10/05/2011   Coronary artery disease    Tobacco abuse, in remission    Gastroesophageal reflux disease    Obesity, Class II, BMI 35-39.9 04/01/2010   BLINDNESS 04/01/2010   Hypothyroidism 09/29/2009   Hyperlipidemia, unspecified 09/29/2009   Essential hypertension 09/29/2009    Orientation RESPIRATION BLADDER Height & Weight     Self, Time, Situation, Place  Normal Continent Weight: 184 lb 1.4 oz (83.5 kg) Height:  5\' 1"  (154.9 cm)  BEHAVIORAL SYMPTOMS/MOOD NEUROLOGICAL BOWEL NUTRITION STATUS      Continent Diet (Carb modified)  AMBULATORY STATUS COMMUNICATION OF NEEDS Skin   Extensive Assist Verbally Normal                       Personal Care Assistance Level of Assistance  Bathing, Feeding, Dressing Bathing Assistance: Maximum assistance Feeding assistance: Limited assistance Dressing Assistance: Maximum assistance     Functional Limitations Info  Sight, Hearing, Speech Sight Info: Impaired Hearing Info: Adequate Speech Info: Adequate    SPECIAL CARE FACTORS FREQUENCY                       Contractures Contractures Info: Not present    Additional Factors Info  Code Status, Allergies Code Status Info: FULL Allergies Info: Codeine  Current Medications (06/20/2023):  This is the current hospital active medication list Current Facility-Administered Medications  Medication Dose Route Frequency Provider Last Rate Last Admin   acetaminophen (TYLENOL) tablet 650 mg  650 mg Oral Q6H PRN Sherryll Burger, Pratik D, DO       Or   acetaminophen (TYLENOL) suppository 650 mg  650 mg Rectal Q6H PRN Sherryll Burger, Pratik D, DO        amiodarone (PACERONE) tablet 200 mg  200 mg Oral BID Tyrone Nine, MD   200 mg at 06/20/23 0913   amitriptyline (ELAVIL) tablet 50 mg  50 mg Oral QHS Shah, Pratik D, DO   50 mg at 06/19/23 2203   apixaban (ELIQUIS) tablet 5 mg  5 mg Oral BID Antoine Poche, MD   5 mg at 06/20/23 0914   azaTHIOprine (IMURAN) tablet 150 mg  150 mg Oral Daily Maurilio Lovely D, DO   150 mg at 06/20/23 1610   Chlorhexidine Gluconate Cloth 2 % PADS 6 each  6 each Topical Q0600 Maurilio Lovely D, DO   6 each at 06/20/23 9604   ezetimibe (ZETIA) tablet 10 mg  10 mg Oral Daily Sherryll Burger, Pratik D, DO   10 mg at 06/20/23 5409   hydrALAZINE (APRESOLINE) tablet 50 mg  50 mg Oral TID Antoine Poche, MD   50 mg at 06/20/23 0913   HYDROcodone bit-homatropine (HYCODAN) 5-1.5 MG/5ML syrup 5 mL  5 mL Oral Q4H PRN Maurilio Lovely D, DO   5 mL at 06/17/23 0535   insulin aspart (novoLOG) injection 0-5 Units  0-5 Units Subcutaneous QHS Sherryll Burger, Pratik D, DO   2 Units at 06/15/23 2109   insulin aspart (novoLOG) injection 0-9 Units  0-9 Units Subcutaneous TID WC Sherryll Burger, Pratik D, DO   2 Units at 06/20/23 1159   levalbuterol (XOPENEX) nebulizer solution 0.63 mg  0.63 mg Nebulization Q6H PRN Sherryll Burger, Pratik D, DO       levothyroxine (SYNTHROID) tablet 100 mcg  100 mcg Oral QAC breakfast Sherryll Burger, Pratik D, DO   100 mcg at 06/20/23 8119   losartan (COZAAR) tablet 100 mg  100 mg Oral Daily Pricilla Riffle, MD   100 mg at 06/20/23 0914   ondansetron (ZOFRAN) tablet 4 mg  4 mg Oral Q6H PRN Maurilio Lovely D, DO   4 mg at 06/20/23 1206   Or   ondansetron (ZOFRAN) injection 4 mg  4 mg Intravenous Q6H PRN Maurilio Lovely D, DO       Oral care mouth rinse  15 mL Mouth Rinse 4 times per day Maurilio Lovely D, DO   15 mL at 06/19/23 2204   Oral care mouth rinse  15 mL Mouth Rinse PRN Sherryll Burger, Pratik D, DO       pneumococcal 20-valent conjugate vaccine (PREVNAR 20) injection 0.5 mL  0.5 mL Intramuscular Tomorrow-1000 Eduard Clos, MD       predniSONE (DELTASONE)  tablet 10 mg  10 mg Oral Daily Sherryll Burger, Pratik D, DO   10 mg at 06/20/23 1478   senna-docusate (Senokot-S) tablet 1 tablet  1 tablet Oral Daily Tyrone Nine, MD   1 tablet at 06/20/23 0913   sertraline (ZOLOFT) tablet 50 mg  50 mg Oral Daily Maurilio Lovely D, DO   50 mg at 06/20/23 0913   sodium chloride flush (NS) 0.9 % injection 10-40 mL  10-40 mL Intracatheter Q12H Shah, Pratik D, DO   10 mL at 06/19/23 2210   sodium chloride flush (  NS) 0.9 % injection 10-40 mL  10-40 mL Intracatheter PRN Maurilio Lovely D, DO         Discharge Medications: Allergies as of 06/20/2023       Reactions   Codeine Anaphylaxis, Other (See Comments)   REACTION: caused "cramping in hands" and hyperventilation.        Medication List     STOP taking these medications    nitrofurantoin (macrocrystal-monohydrate) 100 MG capsule Commonly known as: MACROBID       TAKE these medications    albuterol 108 (90 Base) MCG/ACT inhaler Commonly known as: VENTOLIN HFA Inhale 2 puffs into the lungs every 6 (six) hours as needed for wheezing or shortness of breath.   amiodarone 200 MG tablet Commonly known as: PACERONE Take 1 tablet (200 mg total) by mouth 2 (two) times daily for 21 days, THEN 1 tablet (200 mg total) daily for 9 days. and continue 200mg  daily thereafter. Start taking on: June 20, 2023 What changed: See the new instructions.   amitriptyline 50 MG tablet Commonly known as: ELAVIL Take 50 mg by mouth at bedtime.   apixaban 5 MG Tabs tablet Commonly known as: ELIQUIS Take 1 tablet (5 mg total) by mouth 2 (two) times daily.   azaTHIOprine 50 MG tablet Commonly known as: IMURAN Take 3 tablets (150 mg total) by mouth daily.   bisacodyl 5 MG EC tablet Commonly known as: DULCOLAX Take 5 mg by mouth in the morning and at bedtime.   calcium carbonate 500 MG chewable tablet Commonly known as: TUMS - dosed in mg elemental calcium Chew 1,000 mg by mouth every 8 (eight) hours as needed for  heartburn.   Cholecalciferol 50 MCG (2000 UT) Tabs Take 2,000 Units by mouth daily at 6 (six) AM.   ezetimibe 10 MG tablet Commonly known as: ZETIA Take 1 tablet (10 mg total) by mouth daily.   guaifenesin 100 MG/5ML syrup Commonly known as: ROBITUSSIN Take 200 mg by mouth every 6 (six) hours as needed for cough. Do not exceed 4 doses in 24 hours   Hemorrhoidal 0.25-14-71.9 % Oint Generic drug: Phenylephrine-Mineral Oil-Pet Place 1 application rectally every 6 (six) hours as needed (hemorrhoid discomfort).   hydrALAZINE 50 MG tablet Commonly known as: APRESOLINE Take 1 tablet (50 mg total) by mouth every 8 (eight) hours.   Imodium A-D 2 MG tablet Generic drug: loperamide Take 4 mg by mouth as needed for diarrhea or loose stools.   insulin lispro 100 UNIT/ML KwikPen Commonly known as: HUMALOG Inject 2-10 Units into the skin 3 (three) times daily before meals. CBG < 121 = 0 units, 121-150 = 2 units, 151-200 = 3 units, 201-250 = 5 units, 251-300 = 8 units, 301-400 = 15 units.   levothyroxine 100 MCG tablet Commonly known as: SYNTHROID Take 1 tablet (100 mcg total) by mouth daily before breakfast.   lidocaine 5 % Commonly known as: LIDODERM Place 1 patch onto the skin daily. On for 12 hours, off for 12 hours.   loratadine 10 MG tablet Commonly known as: CLARITIN Take 10 mg by mouth daily.   losartan 50 MG tablet Commonly known as: COZAAR Take 1 tablet (50 mg total) by mouth daily.   magnesium oxide 400 (240 Mg) MG tablet Commonly known as: MAG-OX Take 1 tablet (400 mg total) by mouth daily.   melatonin 5 MG Tabs Take 5 mg by mouth at bedtime.   MILK OF MAGNESIA PO Take 30 mLs by mouth 2 (two) times daily  as needed (constipation).   nystatin powder Generic drug: nystatin Apply 1 Application topically 3 (three) times daily as needed (redness under abdominal folds and breasts).   ondansetron 4 MG tablet Commonly known as: ZOFRAN Take 4 mg by mouth every 8 (eight)  hours as needed for nausea or vomiting.   polyethylene glycol 17 g packet Commonly known as: MIRALAX / GLYCOLAX Take 17 g by mouth 2 (two) times daily.   potassium chloride 10 MEQ CR capsule Commonly known as: MICRO-K Take 20 mEq by mouth 2 (two) times daily.   predniSONE 10 MG tablet Commonly known as: DELTASONE Take 10 mg by mouth daily.   senna 8.6 MG Tabs tablet Commonly known as: SENOKOT Take 1 tablet by mouth in the morning and at bedtime.   sertraline 50 MG tablet Commonly known as: ZOLOFT Take 50 mg by mouth daily.         Relevant Imaging Results:  Relevant Lab Results:   Additional Information SSN: [id number] [place]

## 2023-07-07 ENCOUNTER — Other Ambulatory Visit (HOSPITAL_COMMUNITY): Payer: Medicare Other

## 2023-07-07 ENCOUNTER — Telehealth (INDEPENDENT_AMBULATORY_CARE_PROVIDER_SITE_OTHER): Payer: Self-pay | Admitting: *Deleted

## 2023-07-07 NOTE — Telephone Encounter (Signed)
 [name] from [facility] in [place] called about orders they received in mail back in December and wanted to know if we still needed them. She told me the ordered got buried in the mail.  I let her know I made multiple calls and faxed over orders to them multiple times without getting results and I did mail them since I was not getting anywhere with faxing them or calling to get orders.I let her know we did still need the igG order from October and nash fibrosure order from November. She asked me to fax over the igg order because she only had fibrosure order. I verified fax number was still [phone] and she said yes.  I went ahead and faxed both orders again.

## 2023-07-12 ENCOUNTER — Emergency Department (HOSPITAL_COMMUNITY): Payer: Medicare Other

## 2023-07-12 ENCOUNTER — Observation Stay (HOSPITAL_COMMUNITY)
Admission: EM | Admit: 2023-07-12 | Discharge: 2023-07-14 | Disposition: A | Discharge status: Skilled Nursing Facility | Payer: Medicare Other | Attending: Internal Medicine | Admitting: Internal Medicine

## 2023-07-12 ENCOUNTER — Encounter (HOSPITAL_COMMUNITY): Payer: Self-pay

## 2023-07-12 ENCOUNTER — Other Ambulatory Visit: Payer: Self-pay

## 2023-07-12 DIAGNOSIS — W19XXXA Unspecified fall, initial encounter: Secondary | ICD-10-CM | POA: Diagnosis not present

## 2023-07-12 DIAGNOSIS — E119 Type 2 diabetes mellitus without complications: Secondary | ICD-10-CM | POA: Insufficient documentation

## 2023-07-12 DIAGNOSIS — D500 Iron deficiency anemia secondary to blood loss (chronic): Secondary | ICD-10-CM | POA: Diagnosis not present

## 2023-07-12 DIAGNOSIS — D539 Nutritional anemia, unspecified: Secondary | ICD-10-CM | POA: Insufficient documentation

## 2023-07-12 DIAGNOSIS — Z794 Long term (current) use of insulin: Secondary | ICD-10-CM | POA: Insufficient documentation

## 2023-07-12 DIAGNOSIS — Z6833 Body mass index (BMI) 33.0-33.9, adult: Secondary | ICD-10-CM | POA: Insufficient documentation

## 2023-07-12 DIAGNOSIS — N179 Acute kidney failure, unspecified: Principal | ICD-10-CM | POA: Diagnosis present

## 2023-07-12 DIAGNOSIS — E66811 Obesity, class 1: Secondary | ICD-10-CM | POA: Diagnosis not present

## 2023-07-12 DIAGNOSIS — E039 Hypothyroidism, unspecified: Secondary | ICD-10-CM | POA: Diagnosis not present

## 2023-07-12 DIAGNOSIS — F32A Depression, unspecified: Secondary | ICD-10-CM | POA: Diagnosis not present

## 2023-07-12 DIAGNOSIS — D509 Iron deficiency anemia, unspecified: Secondary | ICD-10-CM | POA: Diagnosis not present

## 2023-07-12 DIAGNOSIS — E785 Hyperlipidemia, unspecified: Secondary | ICD-10-CM | POA: Diagnosis not present

## 2023-07-12 DIAGNOSIS — D72818 Other decreased white blood cell count: Secondary | ICD-10-CM | POA: Diagnosis not present

## 2023-07-12 DIAGNOSIS — K754 Autoimmune hepatitis: Secondary | ICD-10-CM | POA: Diagnosis not present

## 2023-07-12 DIAGNOSIS — I110 Hypertensive heart disease with heart failure: Secondary | ICD-10-CM | POA: Diagnosis not present

## 2023-07-12 DIAGNOSIS — E1165 Type 2 diabetes mellitus with hyperglycemia: Secondary | ICD-10-CM | POA: Diagnosis not present

## 2023-07-12 DIAGNOSIS — I5022 Chronic systolic (congestive) heart failure: Secondary | ICD-10-CM | POA: Diagnosis not present

## 2023-07-12 DIAGNOSIS — Z859 Personal history of malignant neoplasm, unspecified: Secondary | ICD-10-CM | POA: Diagnosis not present

## 2023-07-12 DIAGNOSIS — L89152 Pressure ulcer of sacral region, stage 2: Secondary | ICD-10-CM | POA: Insufficient documentation

## 2023-07-12 DIAGNOSIS — R0602 Shortness of breath: Secondary | ICD-10-CM | POA: Diagnosis present

## 2023-07-12 DIAGNOSIS — E782 Mixed hyperlipidemia: Secondary | ICD-10-CM | POA: Diagnosis present

## 2023-07-12 DIAGNOSIS — I4819 Other persistent atrial fibrillation: Secondary | ICD-10-CM | POA: Insufficient documentation

## 2023-07-12 DIAGNOSIS — I2510 Atherosclerotic heart disease of native coronary artery without angina pectoris: Secondary | ICD-10-CM | POA: Insufficient documentation

## 2023-07-12 DIAGNOSIS — Y92009 Unspecified place in unspecified non-institutional (private) residence as the place of occurrence of the external cause: Secondary | ICD-10-CM

## 2023-07-12 DIAGNOSIS — Z87891 Personal history of nicotine dependence: Secondary | ICD-10-CM | POA: Diagnosis not present

## 2023-07-12 DIAGNOSIS — I10 Essential (primary) hypertension: Secondary | ICD-10-CM | POA: Diagnosis present

## 2023-07-12 DIAGNOSIS — J449 Chronic obstructive pulmonary disease, unspecified: Secondary | ICD-10-CM | POA: Diagnosis not present

## 2023-07-12 DIAGNOSIS — D72819 Decreased white blood cell count, unspecified: Secondary | ICD-10-CM | POA: Insufficient documentation

## 2023-07-12 LAB — CBC WITH DIFFERENTIAL/PLATELET
Abs Immature Granulocytes: 0.1 10*3/uL — ABNORMAL HIGH (ref 0.00–0.07)
Basophils Absolute: 0 10*3/uL (ref 0.0–0.1)
Basophils Relative: 0 %
Eosinophils Absolute: 0 10*3/uL (ref 0.0–0.5)
Eosinophils Relative: 0 %
HCT: 29.3 % — ABNORMAL LOW (ref 36.0–46.0)
Hemoglobin: 9.5 g/dL — ABNORMAL LOW (ref 12.0–15.0)
Lymphocytes Relative: 14 %
Lymphs Abs: 0.4 10*3/uL — ABNORMAL LOW (ref 0.7–4.0)
MCH: 39.6 pg — ABNORMAL HIGH (ref 26.0–34.0)
MCHC: 32.4 g/dL (ref 30.0–36.0)
MCV: 122.1 fL — ABNORMAL HIGH (ref 80.0–100.0)
Metamyelocytes Relative: 1 %
Monocytes Absolute: 0.2 10*3/uL (ref 0.1–1.0)
Monocytes Relative: 6 %
Myelocytes: 1 %
Neutro Abs: 2.3 10*3/uL (ref 1.7–7.7)
Neutrophils Relative %: 78 %
Platelets: 198 10*3/uL (ref 150–400)
RBC: 2.4 MIL/uL — ABNORMAL LOW (ref 3.87–5.11)
RDW: 22.4 % — ABNORMAL HIGH (ref 11.5–15.5)
WBC: 3 10*3/uL — ABNORMAL LOW (ref 4.0–10.5)
nRBC: 0 % (ref 0.0–0.2)

## 2023-07-12 LAB — BASIC METABOLIC PANEL
Anion gap: 15 (ref 5–15)
BUN: 25 mg/dL — ABNORMAL HIGH (ref 8–23)
CO2: 19 mmol/L — ABNORMAL LOW (ref 22–32)
Calcium: 8.4 mg/dL — ABNORMAL LOW (ref 8.9–10.3)
Chloride: 100 mmol/L (ref 98–111)
Creatinine, Ser: 1.28 mg/dL — ABNORMAL HIGH (ref 0.44–1.00)
GFR, Estimated: 44 mL/min — ABNORMAL LOW (ref >60)
Glucose, Bld: 254 mg/dL — ABNORMAL HIGH (ref 70–99)
Potassium: 5.1 mmol/L (ref 3.5–5.1)
Sodium: 134 mmol/L — ABNORMAL LOW (ref 135–145)

## 2023-07-12 LAB — CBG MONITORING, ED: Glucose-Capillary: 204 mg/dL — ABNORMAL HIGH (ref 70–99)

## 2023-07-12 MED ORDER — SODIUM CHLORIDE 0.9 % IV BOLUS
1000.0000 mL | Freq: Once | INTRAVENOUS | Status: AC
  Administered 2023-07-12: 1000 mL via INTRAVENOUS

## 2023-07-12 NOTE — ED Notes (Signed)
Pt is legally blind.
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
## 2023-07-12 NOTE — ED Notes (Signed)
This RN was told by dayshift nurse she was not able to get an IV on this pt. This RN tried x2 with ultra sound machine, another RN tried by feel, and a Paramedic tried with Ultra sound and was not successful in finding a good vein.  MD Dixion was made aware.

## 2023-07-12 NOTE — ED Notes (Signed)
Pts sister, Raynelle Fanning, would like to be called pertaining to plan of care/whether pt will be going back to facility or if pt will be getting admitted

## 2023-07-12 NOTE — ED Notes (Addendum)
Patient transported to XR.
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
## 2023-07-12 NOTE — ED Triage Notes (Signed)
Pt BIB ems for SOB per pt. Pt states SOB started this morning. Pt denies fever or chills. No wheezing or appearing in distress during triage.

## 2023-07-13 DIAGNOSIS — D509 Iron deficiency anemia, unspecified: Secondary | ICD-10-CM | POA: Insufficient documentation

## 2023-07-13 DIAGNOSIS — F32A Depression, unspecified: Secondary | ICD-10-CM | POA: Insufficient documentation

## 2023-07-13 DIAGNOSIS — D72819 Decreased white blood cell count, unspecified: Secondary | ICD-10-CM | POA: Insufficient documentation

## 2023-07-13 DIAGNOSIS — E782 Mixed hyperlipidemia: Secondary | ICD-10-CM

## 2023-07-13 DIAGNOSIS — N179 Acute kidney failure, unspecified: Secondary | ICD-10-CM | POA: Diagnosis not present

## 2023-07-13 DIAGNOSIS — I10 Essential (primary) hypertension: Secondary | ICD-10-CM | POA: Diagnosis not present

## 2023-07-13 DIAGNOSIS — D539 Nutritional anemia, unspecified: Secondary | ICD-10-CM | POA: Insufficient documentation

## 2023-07-13 DIAGNOSIS — W19XXXA Unspecified fall, initial encounter: Secondary | ICD-10-CM

## 2023-07-13 DIAGNOSIS — I5022 Chronic systolic (congestive) heart failure: Secondary | ICD-10-CM | POA: Insufficient documentation

## 2023-07-13 LAB — COMPREHENSIVE METABOLIC PANEL
ALT: 72 U/L — ABNORMAL HIGH (ref 0–44)
AST: 149 U/L — ABNORMAL HIGH (ref 15–41)
Albumin: 2.3 g/dL — ABNORMAL LOW (ref 3.5–5.0)
Alkaline Phosphatase: 89 U/L (ref 38–126)
Anion gap: 8 (ref 5–15)
BUN: 25 mg/dL — ABNORMAL HIGH (ref 8–23)
CO2: 23 mmol/L (ref 22–32)
Calcium: 7.7 mg/dL — ABNORMAL LOW (ref 8.9–10.3)
Chloride: 103 mmol/L (ref 98–111)
Creatinine, Ser: 0.92 mg/dL (ref 0.44–1.00)
GFR, Estimated: >60 mL/min (ref >60)
Glucose, Bld: 132 mg/dL — ABNORMAL HIGH (ref 70–99)
Potassium: 4 mmol/L (ref 3.5–5.1)
Sodium: 134 mmol/L — ABNORMAL LOW (ref 135–145)
Total Bilirubin: 1.3 mg/dL — ABNORMAL HIGH (ref 0.0–1.2)
Total Protein: 5.1 g/dL — ABNORMAL LOW (ref 6.5–8.1)

## 2023-07-13 LAB — CBC
HCT: 24.7 % — ABNORMAL LOW (ref 36.0–46.0)
Hemoglobin: 8.5 g/dL — ABNORMAL LOW (ref 12.0–15.0)
MCH: 39.9 pg — ABNORMAL HIGH (ref 26.0–34.0)
MCHC: 34.4 g/dL (ref 30.0–36.0)
MCV: 116 fL — ABNORMAL HIGH (ref 80.0–100.0)
Platelets: 153 10*3/uL (ref 150–400)
RBC: 2.13 MIL/uL — ABNORMAL LOW (ref 3.87–5.11)
RDW: 21.6 % — ABNORMAL HIGH (ref 11.5–15.5)
WBC: 2.1 10*3/uL — ABNORMAL LOW (ref 4.0–10.5)
nRBC: 0 % (ref 0.0–0.2)

## 2023-07-13 LAB — PHOSPHORUS: Phosphorus: 3.1 mg/dL (ref 2.5–4.6)

## 2023-07-13 LAB — URINALYSIS, ROUTINE W REFLEX MICROSCOPIC
Bilirubin Urine: NEGATIVE
Glucose, UA: NEGATIVE mg/dL
Ketones, ur: NEGATIVE mg/dL
Nitrite: NEGATIVE
Protein, ur: 30 mg/dL — AB
Specific Gravity, Urine: 1.02 (ref 1.005–1.030)
pH: 6 (ref 5.0–8.0)

## 2023-07-13 LAB — IRON AND TIBC
Iron: 99 ug/dL (ref 28–170)
Saturation Ratios: 60 % — ABNORMAL HIGH (ref 10.4–31.8)
TIBC: 166 ug/dL — ABNORMAL LOW (ref 250–450)
UIBC: 67 ug/dL

## 2023-07-13 LAB — GLUCOSE, CAPILLARY
Glucose-Capillary: 185 mg/dL — ABNORMAL HIGH (ref 70–99)
Glucose-Capillary: 218 mg/dL — ABNORMAL HIGH (ref 70–99)
Glucose-Capillary: 234 mg/dL — ABNORMAL HIGH (ref 70–99)

## 2023-07-13 LAB — FOLATE: Folate: 6.8 ng/mL (ref >5.9)

## 2023-07-13 LAB — MAGNESIUM: Magnesium: 1.9 mg/dL (ref 1.7–2.4)

## 2023-07-13 LAB — FERRITIN: Ferritin: 438 ng/mL — ABNORMAL HIGH (ref 11–307)

## 2023-07-13 LAB — CBG MONITORING, ED
Glucose-Capillary: 118 mg/dL — ABNORMAL HIGH (ref 70–99)
Glucose-Capillary: 135 mg/dL — ABNORMAL HIGH (ref 70–99)

## 2023-07-13 LAB — VITAMIN B12: Vitamin B-12: 769 pg/mL (ref 180–914)

## 2023-07-13 MED ORDER — POLYSACCHARIDE IRON COMPLEX 150 MG PO CAPS
150.0000 mg | ORAL_CAPSULE | Freq: Every day | ORAL | Status: DC
  Administered 2023-07-13 – 2023-07-14 (×2): 150 mg via ORAL
  Filled 2023-07-13 (×2): qty 1

## 2023-07-13 MED ORDER — ONDANSETRON HCL 4 MG PO TABS: 4.0000 mg | ORAL_TABLET | Freq: Four times a day (QID) | ORAL | Status: DC | PRN

## 2023-07-13 MED ORDER — AMIODARONE HCL 200 MG PO TABS
200.0000 mg | ORAL_TABLET | Freq: Every day | ORAL | Status: DC
  Administered 2023-07-13 – 2023-07-14 (×2): 200 mg via ORAL
  Filled 2023-07-13 (×2): qty 1

## 2023-07-13 MED ORDER — APIXABAN 5 MG PO TABS
5.0000 mg | ORAL_TABLET | Freq: Two times a day (BID) | ORAL | Status: DC
  Administered 2023-07-13 – 2023-07-14 (×5): 5 mg via ORAL
  Filled 2023-07-13 (×5): qty 1

## 2023-07-13 MED ORDER — SODIUM CHLORIDE 0.9 % IV SOLN: INTRAVENOUS | Status: AC

## 2023-07-13 MED ORDER — PREDNISONE 20 MG PO TABS
10.0000 mg | ORAL_TABLET | Freq: Every day | ORAL | Status: DC
  Administered 2023-07-13 – 2023-07-14 (×2): 10 mg via ORAL
  Filled 2023-07-13 (×2): qty 1

## 2023-07-13 MED ORDER — POLYETHYLENE GLYCOL 3350 17 G PO PACK: 17.0000 g | PACK | Freq: Every day | ORAL | Status: DC | PRN

## 2023-07-13 MED ORDER — EZETIMIBE 10 MG PO TABS
10.0000 mg | ORAL_TABLET | Freq: Every day | ORAL | Status: DC
  Administered 2023-07-13 – 2023-07-14 (×2): 10 mg via ORAL
  Filled 2023-07-13 (×2): qty 1

## 2023-07-13 MED ORDER — ONDANSETRON HCL 4 MG/2ML IJ SOLN: 4.0000 mg | Freq: Four times a day (QID) | INTRAMUSCULAR | Status: DC | PRN

## 2023-07-13 MED ORDER — LOSARTAN POTASSIUM 50 MG PO TABS
50.0000 mg | ORAL_TABLET | Freq: Every day | ORAL | Status: DC
  Administered 2023-07-13 – 2023-07-14 (×2): 50 mg via ORAL
  Filled 2023-07-13 (×2): qty 1

## 2023-07-13 MED ORDER — AZATHIOPRINE 50 MG PO TABS
150.0000 mg | ORAL_TABLET | Freq: Every day | ORAL | Status: DC
  Administered 2023-07-13 – 2023-07-14 (×2): 150 mg via ORAL
  Filled 2023-07-13 (×4): qty 3

## 2023-07-13 MED ORDER — SERTRALINE HCL 50 MG PO TABS
50.0000 mg | ORAL_TABLET | Freq: Every day | ORAL | Status: DC
Start: 2023-07-13 — End: 2023-07-15
  Administered 2023-07-13 – 2023-07-14 (×2): 50 mg via ORAL
  Filled 2023-07-13 (×2): qty 1

## 2023-07-13 MED ORDER — AMITRIPTYLINE HCL 25 MG PO TABS
50.0000 mg | ORAL_TABLET | Freq: Every day | ORAL | Status: DC
  Administered 2023-07-13 – 2023-07-14 (×3): 50 mg via ORAL
  Filled 2023-07-13 (×3): qty 2

## 2023-07-13 MED ORDER — ACETAMINOPHEN 325 MG PO TABS
650.0000 mg | ORAL_TABLET | Freq: Four times a day (QID) | ORAL | Status: DC | PRN
  Administered 2023-07-13 (×2): 650 mg via ORAL
  Filled 2023-07-13 (×2): qty 2

## 2023-07-13 MED ORDER — HYDRALAZINE HCL 25 MG PO TABS
50.0000 mg | ORAL_TABLET | Freq: Three times a day (TID) | ORAL | Status: DC
  Administered 2023-07-13 – 2023-07-14 (×5): 50 mg via ORAL
  Filled 2023-07-13 (×5): qty 2

## 2023-07-13 MED ORDER — INSULIN ASPART 100 UNIT/ML IJ SOLN
0.0000 [IU] | Freq: Three times a day (TID) | INTRAMUSCULAR | Status: DC
  Administered 2023-07-13: 3 [IU] via SUBCUTANEOUS
  Administered 2023-07-13: 5 [IU] via SUBCUTANEOUS
  Administered 2023-07-14: 8 [IU] via SUBCUTANEOUS
  Administered 2023-07-14: 3 [IU] via SUBCUTANEOUS
  Administered 2023-07-14: 4 [IU] via SUBCUTANEOUS

## 2023-07-13 MED ORDER — ACETAMINOPHEN 650 MG RE SUPP: 650.0000 mg | Freq: Four times a day (QID) | RECTAL | Status: DC | PRN

## 2023-07-13 NOTE — Plan of Care (Signed)
  Problem: Acute Rehab OT Goals (only OT should resolve) Goal: Pt. Will Perform Grooming Flowsheets (Taken 07/13/2023 1631) Pt Will Perform Grooming:  with modified independence  sitting Goal: Pt. Will Perform Lower Body Bathing Flowsheets (Taken 07/13/2023 1631) Pt Will Perform Lower Body Bathing:  with modified independence  sitting/lateral leans Goal: Pt. Will Perform Lower Body Dressing Flowsheets (Taken 07/13/2023 1631) Pt Will Perform Lower Body Dressing:  with modified independence  sitting/lateral leans Goal: Pt. Will Transfer To Toilet Flowsheets (Taken 07/13/2023 1631) Pt Will Transfer to Toilet:  stand pivot transfer  with supervision Goal: Pt. Will Perform Toileting-Clothing Manipulation Flowsheets (Taken 07/13/2023 1631) Pt Will Perform Toileting - Clothing Manipulation and hygiene:  with modified independence  sitting/lateral leans Goal: Pt/Caregiver Will Perform Home Exercise Program Flowsheets (Taken 07/13/2023 1631) Pt/caregiver will Perform Home Exercise Program:  Increased ROM  Increased strength  Both right and left upper extremity  Independently  Persephonie Hegwood OT, MOT

## 2023-07-13 NOTE — TOC Initial Note (Signed)
Transition of Care [facility]) - Initial/Assessment Note    Patient Details  Name: [name] MRN: [id number] Date of Birth: 06/24/1945  Transition of [facility]) CM/SW Contact:    Villa Herb, LCSWA Phone Number: 07/13/2023, 11:46 AM  Clinical Narrative:                 Pt is high risk for readmission and well known to TOC. CSW notes PT is recommending SNF if pts ALF [place] cannot offer max assist. CSW spoke to [place] with ALF who states she feels pt needs a higher level of care. CSW spoke with pts sister Raynelle Fanning about SNF referral being sent out. Raynelle Fanning states they are agreeable to referral being sent out locally for review. TOC to follow.   Expected Discharge Plan: Skilled Nursing Facility Barriers to Discharge: Continued Medical Work up   Patient Goals and CMS Choice Patient states their goals for this hospitalization and ongoing recovery are:: go to SNF CMS Medicare.gov Compare Post Acute Care list provided to:: Patient Represenative (must comment) Choice offered to / list presented to : [facility] POA / Guardian      Expected Discharge Plan and Services In-house Referral: Clinical Social Work Discharge Planning Services: CM Consult Post Acute Care Choice: Skilled Nursing Facility Living arrangements for the past 2 months: Assisted Living Facility                                      Prior Living Arrangements/Services Living arrangements for the past 2 months: Assisted Living Facility Lives with:: Facility Resident Patient language and need for interpreter reviewed:: Yes Do you feel safe going back to the place where you live?: Yes      Need for Family Participation in Patient Care: Yes (Comment) Care giver support system in place?: Yes (comment)   Criminal Activity/Legal Involvement Pertinent to Current Situation/Hospitalization: No - Comment as needed  Activities of Daily Living   ADL Screening (condition at time of admission) Independently performs ADLs?:  No Does the patient have a NEW difficulty with bathing/dressing/toileting/self-feeding that is expected to last >3 days?: No Does the patient have a NEW difficulty with getting in/out of bed, walking, or climbing stairs that is expected to last >3 days?: No Does the patient have a NEW difficulty with communication that is expected to last >3 days?: No Is the patient deaf or have difficulty hearing?: No Does the patient have difficulty seeing, even when wearing glasses/contacts?: Yes Does the patient have difficulty concentrating, remembering, or making decisions?: Yes  Permission Sought/Granted                  Emotional Assessment Appearance:: Appears stated age Attitude/Demeanor/Rapport: Engaged Affect (typically observed): Accepting   Alcohol / Substance Use: Not Applicable Psych Involvement: No (comment)  Admission diagnosis:  Acute kidney injury [facility]) [N17.9] Patient Active Problem List   Diagnosis Date Noted   Fall at home, initial encounter 07/13/2023   Macrocytic anemia 07/13/2023   Iron deficiency anemia 07/13/2023   Leukopenia 07/13/2023   Chronic heart failure with mildly reduced ejection fraction (HFmrEF, 41-49%) (HCC) 07/13/2023   Depression 07/13/2023   Acute kidney injury (HCC) 07/12/2023   Atrial flutter with rapid ventricular response (HCC) 06/09/2023   Hypoglycemia 04/25/2023   Atrial fibrillation with RVR (HCC) 04/20/2023   AMS (altered mental status) 02/24/2023   Paroxysmal atrial fibrillation with RVR (HCC) 09/23/2022   Poisoning  by unspecified narcotics, intentional self-harm, initial encounter (HCC) 09/22/2022   Pneumonia 09/22/2022   Loose stools 09/14/2022   Bimalleolar fracture of right ankle 06/29/2021   Hypokalemia 06/29/2021   Type 2 diabetes mellitus with hyperglycemia (HCC) 06/29/2021   Insomnia 06/29/2021   Diabetic neuropathy (HCC) 06/29/2021   COVID-19 virus infection 06/29/2021   Autoimmune hepatitis (HCC) 01/20/2021   Elevated LFTs  12/11/2020   NASH (nonalcoholic steatohepatitis) 12/11/2020   Acute lower UTI 06/18/2018   Elevated troponin 06/18/2018   UTI (urinary tract infection) 06/18/2018   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) 06/21/2017   Mastalgia 06/21/2017   Acute diastolic CHF (congestive heart failure) (HCC) 12/20/2016   Adrenal insufficiency (HCC) 12/18/2016   Lobar pneumonia (HCC) 12/15/2016   Acute metabolic encephalopathy 12/15/2016   Thrombocytopenia (HCC) 12/15/2016   CKD (chronic kidney disease) stage 3, GFR 30-59 ml/min (HCC) 12/15/2016   Sepsis (HCC) 12/14/2016   COPD exacerbation (HCC) 04/24/2016   DM type 2 (diabetes mellitus, type 2) (HCC) 04/24/2016   Aortic atherosclerosis (HCC) 04/24/2016   Transaminitis 10/20/2014   Acute bronchitis 10/20/2014   Acute respiratory failure (HCC) 10/17/2014   IBS (irritable bowel syndrome) 08/07/2012   Lower abdominal pain 10/05/2011   Coronary artery disease    Tobacco abuse, in remission    Gastroesophageal reflux disease    Obesity, Class II, BMI 35-39.9 04/01/2010   BLINDNESS 04/01/2010   Acquired hypothyroidism 09/29/2009   Mixed hyperlipidemia 09/29/2009   Essential hypertension 09/29/2009   PCP:  Galvin Proffer, MD Pharmacy:   [facility] - [place], [place] - [address] [place] [address] Phone: [phone] Fax: [phone]     Social Drivers of Health (SDOH) Social History: SDOH Screenings   Food Insecurity: No Food Insecurity (07/13/2023)  Housing: Low Risk  (07/13/2023)  Transportation Needs: No Transportation Needs (07/13/2023)  Utilities: Not At Risk (07/13/2023)  Social Connections: Socially Integrated (06/10/2023)  Tobacco Use: Medium Risk (07/12/2023)  Health Literacy: High Risk (09/08/2020)   [facility] from [facility]   SDOH Interventions:     Readmission Risk Interventions    07/13/2023   11:45 AM 06/10/2023   12:55 PM 04/21/2023     2:15 PM  Readmission Risk Prevention Plan  Transportation Screening Complete Complete Complete  HRI or Home Care Consult   Complete  Social Work Consult for Recovery Care Planning/Counseling   Complete  Palliative Care Screening   Not Applicable  Medication Review [employer]) Complete Complete Complete  HRI or Home Care Consult Complete Complete   SW Recovery Care/Counseling Consult Complete Complete   Palliative Care Screening Not Applicable Not Applicable   Skilled Nursing Facility Complete Not Applicable

## 2023-07-13 NOTE — ED Notes (Addendum)
Wrong pt

## 2023-07-13 NOTE — Progress Notes (Signed)
Progress Note   Patient: [name]:[id number] DOB: 12/17/1945 DOA: 07/12/2023     1 DOS: the patient was seen and examined on 07/13/2023   Brief hospital admission course: As per H&P written by Dr. Thomes Dinning on 07/12/2023  [name] is a 78 y.o. female with medical history significant of CAD s/p PCI, T2DM, HTN, HLD, COPD, obesity, hypothyroidism, and blindness who presented to the emergency department via EMS from [facility] facility for evaluation of shortness of breath.  Patient complained of feeling short of breath when she woke up this morning and was also concerned about her blood glucose level which she was not sure if it was too high or too low.  Patient sustained a fall yesterday while transferring from from bed to wheelchair and landed on her bottom and she was complaining of pain on her buttocks per ED medical record.  Patient denies numbness, weakness, pain radiation to legs, she denies hitting her head or loss of consciousness.  Patient is wheelchair-bound at baseline.   ED Course:  In the emergency department, respiratory rate was 25/min, other vital signs are within normal range.  Workup in the ED showed WBC 3.0, hemoglobin 8.5, hematocrit 29.3, MCV 122.1, platelets 198.  BMP was normal except for sodium of 134, bicarb 19, blood glucose 254, BUN/creatinine 25/1.28 (baseline creatinine at 0.6). Chest x-ray showed no evidence of acute chest injury Lumbar spine x-ray showed no evidence of acute lumbar spine fracture or traumatic malalignment.  Multilevel spondylosis Hip x-ray showed no evidence of acute hip fracture or dislocation IV NS 1 L was given.  Hospitalist was asked to admit patient for further evaluation and management.  Assessment and Plan: 1-acute kidney injury -In the setting of prerenal azotemia most likely -Renal function resolved after fluid resuscitation provided -Continue renally adjusting medications and avoid nephrotoxic agents -Maintain  adequate hydration.  2-fall at home -Continue fall precaution -PT/OT evaluation appreciated; recommending SNF at discharge -[facility] aware and helping with placement.  3-macrocytic anemia -B12 and folic acid within normal limits -Mild positive iron deficiency anemia appreciated. -Daily oral supplementation will be started.  4-type 2 diabetes with hyperglycemia -Most recent A1c 8.4 -Continue sliding scale insulin and follow CBG fluctuation -Modified carbohydrate diet discussed with patient.  5-persistent atrial fibrillation -Continue treatment with amiodarone and Eliquis.  6-history of autoimmune hepatitis -Continue self tapering and prednisone 10 mg daily.  7-history leukopenia -No fever appreciated -Continue sertraline for now -Appears to be stable and at baseline.  8-chronic heart failure with mildly reduced ejection fraction -Stable and compensated -Recent echo with ejection fraction 45 to 50%. -Continue home medications and outpatient follow-up with cardiology service.  9-history of coronary artery disease/hyperlipidemia -No chest pain -Continue home medications -Heart healthy diet discussed with patient -Continue outpatient follow-up with cardiology service.  10-depression -Continue treatment with amitriptyline or sertraline -No suicidal ideation currently appreciated.  11-stage II medial coccyx pressure injury present at time of admission without signs of superimposed infection -Continue constant repositioning and preventive measures.  12-class I obesity -Body mass index is 33.03 kg/m. -Low-calorie diet and portion control discussed with patient.  Subjective:  Afebrile, no chest pain, no nausea vomiting.  Good saturation on room air.  Reporting generalized weakness and complaining of lower back pain.  Physical Exam: Vitals:   07/13/23 1211 07/13/23 1216 07/13/23 1340 07/13/23 1829  BP: (!) 132/102 130/65 (!) 146/76 139/85  Pulse: 73   79  Resp: 20   20   Temp: 97.9 F (  36.6 C)   98.1 F (36.7 C)  TempSrc: Oral   Oral  SpO2: 100%   97%  Weight:      Height:       General exam: Alert, awake, oriented x 3; in no acute distress. Respiratory system: Clear to auscultation.  Good saturation on room air. Cardiovascular system:RRR. No murmurs, rubs, gallops. Gastrointestinal system: Abdomen is obese, nondistended, soft and nontender. No organomegaly or masses felt. Normal bowel sounds heard. Central nervous system: Generally weak; moving 4 limbs spontaneously.  No focal neurological deficits. Extremities: No cyanosis or clubbing. Skin: No petechiae; stage II pressure injury in her coccyx without signs of superimposed infection.  Present at time of admission. Psychiatry: Judgement and insight appear normal. Mood & affect appropriate.   Data Reviewed: CBC: White blood cells 2.1, hemoglobin 8.5 and platelet count 1 53K B12: 769 Comprehensive metabolic panel: Sodium 134, potassium 4.0, chloride 103, bicarb 23, BUN 25, creatinine 0.92, AST 149, ALT 72, alkaline phosphatase 89 and GFR >60  Family Communication: No family at bedside.  Disposition: Status is: Observation The patient remains OBS appropriate and will d/c before 2 midnights.   Planned Discharge Destination: Skilled nursing facility  Time spent: 50 minutes  Author: Vassie Loll, MD 07/13/2023 7:20 PM  For on call review [email or web address].

## 2023-07-13 NOTE — TOC CM/SW Note (Addendum)
CSW updated that pt is a Code 44. Pt is oriented only to self, due to this CSW will need to complete Code 44 with pts sister Raynelle Fanning. CSW has attempted to reach sister multiple times to complete documentation. CSW left multiple voicemails requesting call back as soon as possible. Pt will not D/C today as she needs SNF placement and will need a bed and auth prior to D/C. CSW to complete CODE 51 with [facility] once call is returned. CSW confirmed with UR supervisor Nicolasa Ducking that CODE 44 documentation can be completed with sister once call is returned.

## 2023-07-13 NOTE — Evaluation (Signed)
Occupational Therapy Evaluation Patient Details Name: [name] MRN: [id number] DOB: Dec 04, 1945 Today's Date: 07/13/2023   History of Present Illness   [name] is a 78 y.o. female with medical history significant of CAD s/p PCI, T2DM, HTN, HLD, COPD, obesity, hypothyroidism, and blindness who presented to the emergency department via EMS from [facility] assisted [facility] facility for evaluation of shortness of breath.  Patient complained of feeling short of breath when she woke up this morning and was also concerned about her blood glucose level which she was not sure if it was too high or too low.  Patient sustained a fall yesterday while transferring from from bed to wheelchair and landed on her bottom and she was complaining of pain on her buttocks per ED medical record.  Patient denies numbness, weakness, pain radiation to legs, she denies hitting her head or loss of consciousness.  Patient is wheelchair-bound at baseline.     Clinical Impressions Pt agreeable to OT evaluation but declined to attempt standing today. Pt reports independent ADL's at baseline. Able to complete bed mobility with min to mod A for sit to supine and supine to sit. Able to scoot to head of bead while in supine with min A. Mild A/ROM limitations noted for shoulder flexion; generally weak otherwise. Able to complete lower body dressing with set up to CGA seated at the EOB. Pt left in bed with call bell within reach. Pt will benefit from continued OT in the hospital and recommended venue below to increase strength, balance, and endurance for safe ADL's.        If plan is discharge home, recommend the following:   A lot of help with walking and/or transfers;A little help with bathing/dressing/bathroom;Assistance with cooking/housework;Assist for transportation;Help with stairs or ramp for entrance     Functional Status Assessment   Patient has had a recent decline in their functional status and  demonstrates the ability to make significant improvements in function in a reasonable and predictable amount of time.     Equipment Recommendations   None recommended by OT            Precautions/Restrictions   Precautions Precautions: Fall Restrictions Weight Bearing Restrictions Per Provider Order: No     Mobility Bed Mobility Overal bed mobility: Needs Assistance Bed Mobility: Supine to Sit, Sit to Supine     Supine to sit: Mod assist, Min assist, HOB elevated Sit to supine: Mod assist   General bed mobility comments: slow labored movement; much tactile and verbal cuing    Transfers                   General transfer comment: Pt declined to stand today.      Balance Overall balance assessment: Needs assistance Sitting-balance support: No upper extremity supported, Feet supported Sitting balance-Leahy Scale: Fair Sitting balance - Comments: seated at EOB                                   ADL either performed or assessed with clinical judgement   ADL Overall ADL's : Needs assistance/impaired     Grooming: Set up;Sitting   Upper Body Bathing: Set up;Sitting   Lower Body Bathing: Contact guard assist;Minimal assistance;Sitting/lateral leans   Upper Body Dressing : Set up;Sitting   Lower Body Dressing: Set up;Contact guard assist;Sitting/lateral leans Lower Body Dressing Details (indicate cue type and reason): Able to doff and on  R sock seated at EOB with extended time. Toilet Transfer: Moderate assistance Toilet Transfer Details (indicate cue type and reason): based on infomration from PT evaluation; pt would not stand today Toileting- Clothing Manipulation and Hygiene: Minimal assistance;Contact guard assist;Sitting/lateral lean               Vision Baseline Vision/History: 2 Legally blind Ability to See in Adequate Light: 4 Severely impaired Vision Assessment?:  (baseline blindness)     Perception Perception: Not  tested       Praxis Praxis: Not tested       Pertinent Vitals/Pain Pain Assessment Pain Assessment: Faces Faces Pain Scale: Hurts a little bit Pain Location: B LE and low back Pain Descriptors / Indicators: Discomfort, Sore Pain Intervention(s): Limited activity within patient's tolerance, Monitored during session, Repositioned     Extremity/Trunk Assessment Upper Extremity Assessment Upper Extremity Assessment: Generalized weakness (Limited to ~50 to 75% avaialble A/ROM for shoulder flexion. 75 to 90% P/ROM of shoulder flexion.)   Lower Extremity Assessment Lower Extremity Assessment: Defer to PT evaluation       Communication Communication Communication: No apparent difficulties   Cognition Arousal: Alert Behavior During Therapy: WFL for tasks assessed/performed Cognition: No apparent impairments                               Following commands: Intact       Cueing  General Comments   Cueing Techniques: Verbal cues;Tactile cues                 Home Living Family/patient expects to be discharged to:: Assisted living                             Home Equipment: Wheelchair - manual          Prior Functioning/Environment Prior Level of Function : Needs assist       Physical Assist : ADLs (physical);Mobility (physical) Mobility (physical): Gait;Stairs ADLs (physical): IADLs Mobility Comments: Supervised transfers, uses wheelchiar mostly, non-ambulatory (per PT) ADLs Comments: Pt reports independence for ADL's with assist for IADL's.    OT Problem List: Decreased strength;Decreased range of motion;Decreased activity tolerance;Impaired balance (sitting and/or standing);Impaired vision/perception;Obesity   OT Treatment/Interventions: Self-care/ADL training;Therapeutic exercise;DME and/or AE instruction;Energy conservation;Therapeutic activities;Patient/family education;Balance training      OT Goals(Current goals can be  found in the care plan section)   Acute Rehab OT Goals Patient Stated Goal: improve function OT Goal Formulation: With patient Time For Goal Achievement: 07/27/23 Potential to Achieve Goals: Good   OT Frequency:  Min 2X/week                  AM-PAC OT "6 Clicks" Daily Activity     Outcome Measure Help from another person eating meals?: None Help from another person taking care of personal grooming?: A Little Help from another person toileting, which includes using toliet, bedpan, or urinal?: A Little Help from another person bathing (including washing, rinsing, drying)?: A Little Help from another person to put on and taking off regular upper body clothing?: A Little Help from another person to put on and taking off regular lower body clothing?: A Little 6 Click Score: 19   End of Session    Activity Tolerance: Patient tolerated treatment well Patient left: in bed;with call bell/phone within reach  OT Visit Diagnosis: Unsteadiness on feet (R26.81);Other abnormalities of gait and mobility (  R26.89);Muscle weakness (generalized) (M62.81);History of falling (Z91.81)                Time: 2952-8413 OT Time Calculation (min): 17 min Charges:  OT General Charges $OT Visit: 1 Visit OT Evaluation $OT Eval Low Complexity: 1 Low  Katiana Ruland OT, MOT   Danie Chandler 07/13/2023, 4:29 PM

## 2023-07-13 NOTE — [place] FL2 (Signed)
MEDICAID [id number] LEVEL OF CARE FORM     IDENTIFICATION  Patient Name: [name] Birthdate: 1945/10/25 Sex: female Admission Date (Current Location): 07/12/2023  [facility] [place] and [place] Number:  [employer] and Address:  [facility],  [address] [place] [address]      Provider Number: [id number]  Attending Physician Name and Address:  Vassie Loll, MD  Relative Name and Phone Number:       Current Level of Care: Hospital Recommended Level of Care: Skilled Nursing Facility Prior Approval Number:    Date Approved/Denied:   PASRR Number: [id number]  Discharge Plan: SNF    Current Diagnoses: Patient Active Problem List   Diagnosis Date Noted   Fall at home, initial encounter 07/13/2023   Macrocytic anemia 07/13/2023   Iron deficiency anemia 07/13/2023   Leukopenia 07/13/2023   Chronic heart failure with mildly reduced ejection fraction (HFmrEF, 41-49%) (HCC) 07/13/2023   Depression 07/13/2023   Acute kidney injury (HCC) 07/12/2023   Atrial flutter with rapid ventricular response (HCC) 06/09/2023   Hypoglycemia 04/25/2023   Atrial fibrillation with RVR (HCC) 04/20/2023   AMS (altered mental status) 02/24/2023   Paroxysmal atrial fibrillation with RVR (HCC) 09/23/2022   Poisoning by unspecified narcotics, intentional self-harm, initial encounter (HCC) 09/22/2022   Pneumonia 09/22/2022   Loose stools 09/14/2022   Bimalleolar fracture of right ankle 06/29/2021   Hypokalemia 06/29/2021   Type 2 diabetes mellitus with hyperglycemia (HCC) 06/29/2021   Insomnia 06/29/2021   Diabetic neuropathy (HCC) 06/29/2021   COVID-19 virus infection 06/29/2021   Autoimmune hepatitis (HCC) 01/20/2021   Elevated LFTs 12/11/2020   NASH (nonalcoholic steatohepatitis) 12/11/2020   Acute lower UTI 06/18/2018   Elevated troponin 06/18/2018   UTI (urinary tract infection) 06/18/2018   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) 06/21/2017    Mastalgia 06/21/2017   Acute diastolic CHF (congestive heart failure) (HCC) 12/20/2016   Adrenal insufficiency (HCC) 12/18/2016   Lobar pneumonia (HCC) 12/15/2016   Acute metabolic encephalopathy 12/15/2016   Thrombocytopenia (HCC) 12/15/2016   CKD (chronic kidney disease) stage 3, GFR 30-59 ml/min (HCC) 12/15/2016   Sepsis (HCC) 12/14/2016   COPD exacerbation (HCC) 04/24/2016   DM type 2 (diabetes mellitus, type 2) (HCC) 04/24/2016   Aortic atherosclerosis (HCC) 04/24/2016   Transaminitis 10/20/2014   Acute bronchitis 10/20/2014   Acute respiratory failure (HCC) 10/17/2014   IBS (irritable bowel syndrome) 08/07/2012   Lower abdominal pain 10/05/2011   Coronary artery disease    Tobacco abuse, in remission    Gastroesophageal reflux disease    Obesity, Class II, BMI 35-39.9 04/01/2010   BLINDNESS 04/01/2010   Acquired hypothyroidism 09/29/2009   Mixed hyperlipidemia 09/29/2009   Essential hypertension 09/29/2009    Orientation RESPIRATION BLADDER Height & Weight        Normal Incontinent Weight: 174 lb 12.8 oz (79.3 kg) Height:  5\' 1"  (154.9 cm)  BEHAVIORAL SYMPTOMS/MOOD NEUROLOGICAL BOWEL NUTRITION STATUS      Incontinent Diet  AMBULATORY STATUS COMMUNICATION OF NEEDS Skin   Extensive Assist Verbally Normal                       Personal Care Assistance Level of Assistance  Bathing, Feeding, Dressing Bathing Assistance: Limited assistance Feeding assistance: Limited assistance Dressing Assistance: Limited assistance     Functional Limitations Info  Sight, Hearing, Speech Sight Info: Impaired Hearing Info: Adequate Speech Info: Adequate    SPECIAL CARE FACTORS FREQUENCY  PT (By  licensed PT), OT (By licensed OT)     PT Frequency: 5 times weekly OT Frequency: 5 times weekly            Contractures Contractures Info: Not present    Additional Factors Info  Code Status, Allergies Code Status Info: FULL Allergies Info: Codeine           Current  Medications (07/13/2023):  This is the current hospital active medication list Current Facility-Administered Medications  Medication Dose Route Frequency Provider Last Rate Last Admin   acetaminophen (TYLENOL) tablet 650 mg  650 mg Oral Q6H PRN Adefeso, Oladapo, DO   650 mg at 07/13/23 0209   Or   acetaminophen (TYLENOL) suppository 650 mg  650 mg Rectal Q6H PRN Adefeso, Oladapo, DO       amiodarone (PACERONE) tablet 200 mg  200 mg Oral Daily Adefeso, Oladapo, DO       amitriptyline (ELAVIL) tablet 50 mg  50 mg Oral QHS Adefeso, Oladapo, DO   50 mg at 07/13/23 0208   apixaban (ELIQUIS) tablet 5 mg  5 mg Oral BID Adefeso, Oladapo, DO   5 mg at 07/13/23 0208   azaTHIOprine (IMURAN) tablet 150 mg  150 mg Oral Daily Adefeso, Oladapo, DO       ezetimibe (ZETIA) tablet 10 mg  10 mg Oral Daily Adefeso, Oladapo, DO       hydrALAZINE (APRESOLINE) tablet 50 mg  50 mg Oral Q8H Adefeso, Oladapo, DO       insulin aspart (novoLOG) injection 0-15 Units  0-15 Units Subcutaneous TID WC Adefeso, Oladapo, DO       losartan (COZAAR) tablet 50 mg  50 mg Oral Daily Adefeso, Oladapo, DO       ondansetron (ZOFRAN) tablet 4 mg  4 mg Oral Q6H PRN Adefeso, Oladapo, DO       Or   ondansetron (ZOFRAN) injection 4 mg  4 mg Intravenous Q6H PRN Adefeso, Oladapo, DO       predniSONE (DELTASONE) tablet 10 mg  10 mg Oral Daily Adefeso, Oladapo, DO       sertraline (ZOLOFT) tablet 50 mg  50 mg Oral Daily Adefeso, Oladapo, DO       Current Outpatient Medications  Medication Sig Dispense Refill   albuterol (VENTOLIN HFA) 108 (90 Base) MCG/ACT inhaler Inhale 2 puffs into the lungs every 6 (six) hours as needed for wheezing or shortness of breath. 8 g 2   amiodarone (PACERONE) 200 MG tablet Take 1 tablet (200 mg total) by mouth 2 (two) times daily for 21 days, THEN 1 tablet (200 mg total) daily for 9 days. and continue 200mg  daily thereafter. 51 tablet 0   amitriptyline (ELAVIL) 50 MG tablet Take 50 mg by mouth at bedtime.      apixaban (ELIQUIS) 5 MG TABS tablet Take 1 tablet (5 mg total) by mouth 2 (two) times daily. 60 tablet 1   azaTHIOprine (IMURAN) 50 MG tablet Take 3 tablets (150 mg total) by mouth daily. 270 tablet 1   bisacodyl (DULCOLAX) 5 MG EC tablet Take 5 mg by mouth in the morning and at bedtime.     calcium carbonate (TUMS - DOSED IN MG ELEMENTAL CALCIUM) 500 MG chewable tablet Chew 1,000 mg by mouth every 8 (eight) hours as needed for heartburn.     Cholecalciferol 50 MCG (2000 UT) TABS Take 2,000 Units by mouth daily at 6 (six) AM.     ezetimibe (ZETIA) 10 MG tablet Take 1 tablet (10 mg total)  by mouth daily. 90 tablet 3   guaifenesin (ROBITUSSIN) 100 MG/5ML syrup Take 200 mg by mouth every 6 (six) hours as needed for cough. Do not exceed 4 doses in 24 hours     hydrALAZINE (APRESOLINE) 50 MG tablet Take 1 tablet (50 mg total) by mouth every 8 (eight) hours.     insulin lispro (HUMALOG) 100 UNIT/ML KwikPen Inject 2-10 Units into the skin 3 (three) times daily before meals. CBG < 199 = 0 units, 200-251 = 2 units, 251-300 = 4 units,301-350 = 6 units, 351-400 = 8 units, 401-500 = 10 units.     loperamide (IMODIUM A-D) 2 MG tablet Take 4 mg by mouth as needed for diarrhea or loose stools.     loratadine (CLARITIN) 10 MG tablet Take 10 mg by mouth daily.     losartan (COZAAR) 50 MG tablet Take 1 tablet (50 mg total) by mouth daily.     Magnesium Hydroxide (MILK OF MAGNESIA PO) Take 30 mLs by mouth 2 (two) times daily as needed (constipation).     magnesium oxide (MAG-OX) 400 (240 Mg) MG tablet Take 1 tablet (400 mg total) by mouth daily.     melatonin 5 MG TABS Take 5 mg by mouth at bedtime.     NYSTATIN powder Apply 1 Application topically 3 (three) times daily as needed (redness under abdominal folds and breasts).     ondansetron (ZOFRAN) 4 MG tablet Take 4 mg by mouth every 8 (eight) hours as needed for nausea or vomiting.     Phenylephrine-Mineral Oil-Pet (HEMORRHOIDAL) 0.25-14-71.9 % OINT Place 1  application rectally every 6 (six) hours as needed (hemorrhoid discomfort).     polyethylene glycol (MIRALAX / GLYCOLAX) 17 g packet Take 17 g by mouth 2 (two) times daily.     potassium chloride (MICRO-K) 10 MEQ CR capsule Take 20 mEq by mouth 2 (two) times daily.     predniSONE (DELTASONE) 10 MG tablet Take 10 mg by mouth daily.     senna (SENOKOT) 8.6 MG TABS tablet Take 2 tablets by mouth daily.     sertraline (ZOLOFT) 50 MG tablet Take 50 mg by mouth daily.       Discharge Medications: Please see discharge summary for a list of discharge medications.  Relevant Imaging Results:  Relevant Lab Results:   Additional Information SSN: [id number], [place]

## 2023-07-13 NOTE — Plan of Care (Signed)
  Problem: Acute Rehab PT Goals(only PT should resolve) Goal: Pt Will Go Sit To Supine/Side Outcome: Progressing Flowsheets (Taken 07/13/2023 1241) Pt will go Sit to Supine/Side: with minimal assist Goal: Patient Will Transfer Sit To/From Stand Outcome: Progressing Flowsheets (Taken 07/13/2023 1241) Patient will transfer sit to/from stand: with minimal assist Goal: Pt Will Transfer Bed To Chair/Chair To Bed Outcome: Progressing Flowsheets (Taken 07/13/2023 1241) Pt will Transfer Bed to Chair/Chair to Bed:  with min assist  with mod assist Goal: Pt Will Ambulate Outcome: Progressing Flowsheets (Taken 07/13/2023 1241) Pt will Ambulate:  10 feet  with moderate assist  with rolling walker   12:42 PM, 07/13/23 Ocie Bob, MPT Physical Therapist with [facility] [phone] office [phone] mobile phone

## 2023-07-13 NOTE — Evaluation (Signed)
Physical Therapy Evaluation Patient Details Name: [name] MRN: [id number] DOB: [birth year] Today's Date: 07/13/2023  History of Present Illness  KJERSTI DITTMER is a 78 y.o. female with medical history significant of CAD s/p PCI, T2DM, HTN, HLD, COPD, obesity, hypothyroidism, and blindness who presented to the emergency department via EMS from [facility] assisted living facility for evaluation of shortness of breath.  Patient complained of feeling short of breath when she woke up this morning and was also concerned about her blood glucose level which she was not sure if it was too high or too low.  Patient sustained a fall yesterday while transferring from from bed to wheelchair and landed on her bottom and she was complaining of pain on her buttocks per ED medical record.  Patient denies numbness, weakness, pain radiation to legs, she denies hitting her head or loss of consciousness.  Patient is wheelchair-bound at baseline.   Clinical Impression  Patient demonstrates slow labored movement for sitting up at bedside with difficulty moving legs due to weakness, limited to a few side steps during attempt to transfer to chair before having to sit at bedside due to loss of balance.  Patient put back to bed with mod assist for repositioning.  Patient will benefit from continued skilled physical therapy in hospital and recommended venue below to increase strength, balance, endurance for safe ADLs and transfers.          If plan is discharge home, recommend the following: A lot of help with walking and/or transfers;A lot of help with bathing/dressing/bathroom;Assistance with cooking/housework;Help with stairs or ramp for entrance   Can travel by private vehicle   No    Equipment Recommendations None recommended by PT  Recommendations for Other Services       Functional Status Assessment Patient has had a recent decline in their functional status and demonstrates the ability to make  significant improvements in function in a reasonable and predictable amount of time.     Precautions / Restrictions Precautions Precautions: Fall Restrictions Weight Bearing Restrictions Per Provider Order: No      Mobility  Bed Mobility Overal bed mobility: Needs Assistance Bed Mobility: Supine to Sit, Sit to Supine     Supine to sit: Mod assist, Max assist Sit to supine: Mod assist, Max assist   General bed mobility comments: slow labored movement with poor return for moving BLE due to weakness    Transfers Overall transfer level: Needs assistance Equipment used: Rolling walker (2 wheels) Transfers: Sit to/from Stand Sit to Stand: Mod assist           General transfer comment: poor standing balance due to buckling of legs    Ambulation/Gait Ambulation/Gait assistance: Max assist Gait Distance (Feet): 3 Feet Assistive device: Rolling walker (2 wheels) Gait Pattern/deviations: Decreased step length - right, Decreased step length - left, Decreased stride length, Decreased stance time - left Gait velocity: slow     General Gait Details: limited to a few slow labored side steps before losing balance due to weakness  Stairs            Wheelchair Mobility     Tilt Bed    Modified Rankin (Stroke Patients Only)       Balance Overall balance assessment: Needs assistance Sitting-balance support: Feet supported, Bilateral upper extremity supported Sitting balance-Leahy Scale: Fair Sitting balance - Comments: seated at EOB Postural control: Posterior lean Standing balance support: Reliant on assistive device for balance, During functional activity Standing balance-Leahy Scale:  Poor Standing balance comment: using RW                             Pertinent Vitals/Pain Pain Assessment Pain Assessment: Faces Faces Pain Scale: Hurts a little bit Pain Location: BLE with  movement Pain Descriptors / Indicators: Sore Pain Intervention(s): Limited  activity within patient's tolerance, Monitored during session, Repositioned    Home Living Family/patient expects to be discharged to:: Assisted living                 Home Equipment: Wheelchair - manual      Prior Function Prior Level of Function : Needs assist       Physical Assist : Mobility (physical);ADLs (physical) Mobility (physical): Bed mobility;Transfers;Gait;Stairs   Mobility Comments: Supervised transfers, uses wheelchiar mostly, non-ambulatory ADLs Comments: Assisted by ALF staff     Extremity/Trunk Assessment   Upper Extremity Assessment Upper Extremity Assessment: Defer to OT evaluation    Lower Extremity Assessment Lower Extremity Assessment: Generalized weakness    Cervical / Trunk Assessment Cervical / Trunk Assessment: Kyphotic  Communication   Communication Communication: No apparent difficulties    Cognition Arousal: Alert Behavior During Therapy: WFL for tasks assessed/performed   PT - Cognitive impairments: No apparent impairments                         Following commands: Intact       Cueing Cueing Techniques: Verbal cues, Tactile cues     General Comments      Exercises     Assessment/Plan    PT Assessment Patient needs continued PT services  PT Problem List Decreased strength;Decreased activity tolerance;Decreased balance;Decreased mobility       PT Treatment Interventions DME instruction;Gait training;Stair training;Functional mobility training;Therapeutic activities;Therapeutic exercise;Balance training;Patient/family education    PT Goals (Current goals can be found in the Care Plan section)  Acute Rehab PT Goals Patient Stated Goal: return home PT Goal Formulation: With patient Time For Goal Achievement: 07/27/23 Potential to Achieve Goals: Good    Frequency Min 3X/week     Co-evaluation               AM-PAC PT "6 Clicks" Mobility  Outcome Measure Help needed turning from your back to  your side while in a flat bed without using bedrails?: A Lot Help needed moving from lying on your back to sitting on the side of a flat bed without using bedrails?: A Lot Help needed moving to and from a bed to a chair (including a wheelchair)?: A Lot Help needed standing up from a chair using your arms (e.g., wheelchair or bedside chair)?: A Lot Help needed to walk in hospital room?: A Lot Help needed climbing 3-5 steps with a railing? : Total 6 Click Score: 11    End of Session   Activity Tolerance: Patient tolerated treatment well;Patient limited by fatigue Patient left: in bed;with call bell/phone within reach Nurse Communication: Mobility status PT Visit Diagnosis: Unsteadiness on feet (R26.81);Other abnormalities of gait and mobility (R26.89);Muscle weakness (generalized) (M62.81)    Time: 1610-9604 PT Time Calculation (min) (ACUTE ONLY): 18 min   Charges:   PT Evaluation $PT Eval Low Complexity: 1 Low PT Treatments $Therapeutic Activity: 8-22 mins PT General Charges $$ ACUTE PT VISIT: 1 Visit         11:38 AM, 07/13/23 Ocie Bob, MPT Physical Therapist with [facility] [phone] office  [phone] mobile phone

## 2023-07-14 DIAGNOSIS — N179 Acute kidney failure, unspecified: Secondary | ICD-10-CM | POA: Diagnosis not present

## 2023-07-14 DIAGNOSIS — D509 Iron deficiency anemia, unspecified: Secondary | ICD-10-CM | POA: Diagnosis not present

## 2023-07-14 DIAGNOSIS — E1165 Type 2 diabetes mellitus with hyperglycemia: Secondary | ICD-10-CM | POA: Diagnosis not present

## 2023-07-14 DIAGNOSIS — E782 Mixed hyperlipidemia: Secondary | ICD-10-CM | POA: Diagnosis not present

## 2023-07-14 LAB — GLUCOSE, CAPILLARY
Glucose-Capillary: 154 mg/dL — ABNORMAL HIGH (ref 70–99)
Glucose-Capillary: 185 mg/dL — ABNORMAL HIGH (ref 70–99)
Glucose-Capillary: 257 mg/dL — ABNORMAL HIGH (ref 70–99)
Glucose-Capillary: 193 mg/dL — ABNORMAL HIGH (ref 70–99)

## 2023-07-14 MED ORDER — POLYSACCHARIDE IRON COMPLEX 150 MG PO CAPS: 150.0000 mg | ORAL_CAPSULE | Freq: Every day | ORAL | 1 refills | Status: DC

## 2023-07-14 MED ORDER — ACETAMINOPHEN 325 MG PO TABS: 650.0000 mg | ORAL_TABLET | Freq: Four times a day (QID) | ORAL | Status: DC | PRN

## 2023-07-14 NOTE — Plan of Care (Signed)
  Problem: Skin Integrity: Goal: Risk for impaired skin integrity will decrease Outcome: Progressing   Problem: Coping: Goal: Level of anxiety will decrease Outcome: Progressing

## 2023-07-14 NOTE — TOC Progression Note (Addendum)
Transition of Care [facility]) - Progression Note    Patient Details  Name: [name] MRN: [id number] Date of Birth: 08/23/1945  Transition [facility] Care [facility]) CM/SW Contact  Villa Herb, [place] Phone Number: 07/14/2023, 10:02 AM  Clinical Narrative:    CSW spoke with pts sister to review bed offers, they accept bed at [facility]. CSW updated HUB to reflect this. CSW spoke to [place] in admissions who states they can have a bed for pt once Berkley Harvey has come through. CSW updated auth to reflect bed choice. CSW sent updated clinicals to auth. TOC to follow.   Addendum 1pm: CSW updated that insurance Berkley Harvey has been approved for SNF at [facility] at this time. CSW updated MD and RN of this. CSW updated MD that D/C can be completed if pt is medially stable and can be D/C to [facility]. TOC to follow.   Expected Discharge Plan: Skilled Nursing Facility Barriers to Discharge: Continued Medical Work up  Expected Discharge Plan and Services In-house Referral: Clinical Social Work Discharge Planning Services: CM Consult Post Acute Care Choice: Skilled Nursing Facility Living arrangements for the past 2 months: Assisted Living Facility                                       Social Determinants of Health (SDOH) Interventions SDOH Screenings   Food Insecurity: No Food Insecurity (07/13/2023)  Housing: Low Risk  (07/13/2023)  Transportation Needs: No Transportation Needs (07/13/2023)  Utilities: Not At Risk (07/13/2023)  Social Connections: Moderately Isolated (07/13/2023)  Tobacco Use: Medium Risk (07/12/2023)  Health Literacy: High Risk (09/08/2020)   Received from [facility], [facility]    Readmission Risk Interventions    07/13/2023   11:45 AM 06/10/2023   12:55 PM 04/21/2023    2:15 PM  Readmission Risk Prevention Plan  Transportation Screening Complete Complete Complete  HRI or Home Care Consult   Complete  Social Work Consult for Recovery Care Planning/Counseling   Complete   Palliative Care Screening   Not Applicable  Medication Review Oceanographer) Complete Complete Complete  HRI or Home Care Consult Complete Complete   SW Recovery Care/Counseling Consult Complete Complete   Palliative Care Screening Not Applicable Not Applicable   Skilled Nursing Facility Complete Not Applicable

## 2023-07-14 NOTE — Plan of Care (Signed)
Problem: Nutritional: Goal: Maintenance of adequate nutrition will improve Outcome: Progressing Goal: Progress toward achieving an optimal weight will improve Outcome: Progressing

## 2023-07-14 NOTE — Care Management CC44 (Signed)
Condition Code 44 Documentation Completed  Patient Details  Name: [name] MRN: [id number] Date of Birth: Nov 01, 1945   Condition Code 44 given:  Yes Patient signature on Condition Code 44 notice:  Yes Documentation of 2 MD's agreement:  Yes Code 44 added to claim:  Yes    Villa Herb, LCSWA 07/14/2023, 9:39 AM

## 2023-07-14 NOTE — Care Management Obs Status (Signed)
MEDICARE OBSERVATION STATUS NOTIFICATION   Patient Details  Name: [name] MRN: [id number] Date of Birth: [birth year]   Medicare Observation Status Notification Given:  Yes    Villa Herb, Theresia Majors 07/14/2023, 9:39 AM
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
## 2023-07-14 NOTE — TOC Transition Note (Signed)
Transition of Care [facility]) - Discharge Note   Patient Details  Name: [name] MRN: [id number] Date of Birth: 03/13/1946  [facility] CM/SW Contact:  Villa Herb, LCSWA Phone Number: 07/14/2023, 3:54 PM   Clinical Narrative:    CSW updated that pts insurance auth has been approved for SNF at [facility]. CSW updated MD and D/C has been completed. CSW left secure VM update for pts sister Raynelle Fanning. CSW provided RN with room and report numbers. [facility] at [facility] states they are ready to accept. Med necessity completed and printed to floor for RN. EMS called for transport. TOC signing off.   Final next level of care: Skilled Nursing Facility Barriers to Discharge: Barriers Resolved   Patient Goals and CMS Choice Patient states their goals for this hospitalization and ongoing recovery are:: go to SNF CMS Medicare.gov Compare Post Acute Care list provided to:: Patient Represenative (must comment) Choice offered to / list presented to : [facility] POA / Guardian      Discharge Placement                Patient to be transferred to facility by: EMS Name of family member notified: Mona Patient and family notified of of transfer: 07/14/23  Discharge Plan and Services Additional resources added to the After Visit Summary for   In-house Referral: Clinical Social Work Discharge Planning Services: CM Consult Post Acute Care Choice: Skilled Nursing Facility                               Social Drivers of Health (SDOH) Interventions SDOH Screenings   Food Insecurity: No Food Insecurity (07/13/2023)  Housing: Low Risk  (07/13/2023)  Transportation Needs: No Transportation Needs (07/13/2023)  Utilities: Not At Risk (07/13/2023)  Social Connections: Moderately Isolated (07/13/2023)  Tobacco Use: Medium Risk (07/12/2023)  Health Literacy: High Risk (09/08/2020)   [facility]     Readmission Risk Interventions    07/13/2023   11:45 AM  06/10/2023   12:55 PM 04/21/2023    2:15 PM  Readmission Risk Prevention Plan  Transportation Screening Complete Complete Complete  HRI or Home Care Consult   Complete  Social Work Consult for Recovery Care Planning/Counseling   Complete  Palliative Care Screening   Not Applicable  Medication Review Oceanographer) Complete Complete Complete  HRI or Home Care Consult Complete Complete   SW Recovery Care/Counseling Consult Complete Complete   Palliative Care Screening Not Applicable Not Applicable   Skilled Nursing Facility Complete Not Applicable

## 2023-07-21 ENCOUNTER — Ambulatory Visit (HOSPITAL_COMMUNITY): Admit: 2023-07-21 | Payer: Medicare Other

## 2023-07-22 ENCOUNTER — Encounter: Payer: Self-pay | Admitting: Nurse Practitioner

## 2023-07-22 ENCOUNTER — Ambulatory Visit: Payer: Medicare Other | Attending: Nurse Practitioner | Admitting: Nurse Practitioner

## 2023-07-22 VITALS — BP 130/80 | HR 68 | Ht 61.0 in | Wt 170.0 lb

## 2023-07-22 DIAGNOSIS — R450 Nervousness: Secondary | ICD-10-CM

## 2023-07-22 DIAGNOSIS — E785 Hyperlipidemia, unspecified: Secondary | ICD-10-CM

## 2023-07-22 DIAGNOSIS — I10 Essential (primary) hypertension: Secondary | ICD-10-CM | POA: Diagnosis not present

## 2023-07-22 DIAGNOSIS — I480 Paroxysmal atrial fibrillation: Secondary | ICD-10-CM

## 2023-07-22 DIAGNOSIS — I4892 Unspecified atrial flutter: Secondary | ICD-10-CM

## 2023-07-22 DIAGNOSIS — I2510 Atherosclerotic heart disease of native coronary artery without angina pectoris: Secondary | ICD-10-CM

## 2023-07-22 DIAGNOSIS — R110 Nausea: Secondary | ICD-10-CM

## 2023-07-22 DIAGNOSIS — R52 Pain, unspecified: Secondary | ICD-10-CM

## 2023-07-22 NOTE — Patient Instructions (Addendum)
 Medication Instructions:  Your physician recommends that you continue on your current medications as directed. Please refer to the Current Medication list given to you today.  Labwork: Please have previous labs done in 1-2 weeks   Testing/Procedures: None   Follow-Up: Your physician recommends that you schedule a follow-up appointment in:  Please follow up with your PCP and gastroenterology  Follow up with [place] in 3-4 months   Any Other Special Instructions Will Be Listed Below (If Applicable).  If you need a refill on your cardiac medications before your next appointment, please call your pharmacy.

## 2023-07-22 NOTE — Progress Notes (Addendum)
 Cardiology Office Note:  .   Date:  07/22/2023  ID:  [name], DOB 03/20/1946, MRN [id number] PCP: Galvin Proffer, MD  [facility] Providers Cardiologist:  Dina Rich, MD    History of Present Illness: .   [name] is a 77 y.o. female with a PMH of CAD, HTN, HLD, T2DM, COPD, NASH, and autoimmune hepatitis, PAF, and blind/vision impairment, who presents today for 6 month follow-up.   History of bare-metal stent to left circumflex in 2003 and drug-eluting stent to OM 3, left circumflex, and RCA in 2006.  Newly diagnosed with A-fib during hospital admission in April 2024, treated for pneumonia.  Last seen by Dr. Dina Rich on January 18, 2023.  There was no signs of recurrence of A-fib.  14-day monitor was arranged -see report below.   Hospitalized in November 2024 with acute respiratory failure with hypoxia, acute HFrEF.  On presentation, she is hypoxic on room air with SpO2 at 80%.  Imaging was significant for pulmonary edema likely due to A-fib with RVR. Limited echocardiogram in November 2024 revealed EF 45 to 50%.  Treated with IV amiodarone.  Metoprolol was discontinued due to bradycardia.  Hospital stay in January 2025 due to a flutter with RVR.  Did convert to normal sinus rhythm on January 18.  Hospital course noted by RSV infection, no pneumonia by chest x-ray.  Recently hospitalized in early February 2025 due to evaluation for shortness of breath.  She had a fall prior to ED arrival.  Was noted to have AKI.  Was noted to be wheelchair-bound at baseline.  Today she presents for follow-up. Patient is a difficult historian. She comes in today from SNF. She admits to generalized aches and says, "my whole body hurts." Admits to 2 episodes of nausea this morning and says it has not gotten better. Denies any chest pain, shortness of breath, palpitations, syncope, presyncope, dizziness, orthopnea, PND, swelling or significant weight changes, acute bleeding, or  claudication.  ROS: Negative. See HPI.   Studies Reviewed: Marland Kitchen    EKG: EKG is not ordered today.   Limited Echo 04/2023:  1. Limited study.   2. Left ventricular ejection fraction, by estimation, is 45 to 50%. The  left ventricle has mildly decreased function. The left ventricle  demonstrates regional wall motion abnormalities (see scoring  diagram/findings for description).   3. Right ventricular systolic function is normal. The right ventricular  size is normal.   4. The mitral valve is degenerative.   5. The inferior vena cava is dilated in size with >50% respiratory  variability, suggesting right atrial pressure of 8 mmHg.   Comparison(s): Prior images reviewed side by side. LVEF mildly reduced in 45-50% range.   Cardiac monitor 04/2023:    14 day monitor   Rare supraventricular ectopy in the form of isolated PACs, couplets, triplets. 25 runs of SVT, longest 20 beats.   Occasional ventricular ectopy in the form of isolated PVCs (4.5%). Rare couplets, triplets   No reported symptoms     Patch Wear Time:  13 days and 17 hours (2024-08-22T13:56:28-0400 to 2024-09-05T07:47:17-0400)   Patient had a min HR of 38 bpm, max HR of 154 bpm, and avg HR of 51 bpm. Predominant underlying rhythm was Sinus Rhythm. 25 Supraventricular Tachycardia runs occurred, the run with the fastest interval lasting 5 beats with a max rate of 154 bpm, the  longest lasting 20 beats with an avg rate of 100 bpm. Isolated SVEs were rare (<1.0%),  SVE Couplets were rare (<1.0%), and SVE Triplets were rare (<1.0%). Isolated VEs were occasional (4.5%, 44399), VE Couplets were rare (<1.0%, 647), and VE Triplets  were rare (<1.0%, 28). Ventricular Bigeminy and Trigeminy were present.   Echo 08/2022:  1. Left ventricular ejection fraction, by estimation, is 50 to 55%. The  left ventricle has low normal function. Left ventricular endocardial  border not optimally defined to evaluate regional wall motion. Left   ventricular diastolic function could not be  evaluated.   2. Right ventricular systolic function was not well visualized. The right  ventricular size is normal. There is normal pulmonary artery systolic  pressure.   3. Left atrial size was moderately dilated.   4. The mitral valve is grossly normal. Mild mitral valve regurgitation.  No evidence of mitral stenosis.   5. The aortic valve was not well visualized. Aortic valve regurgitation  is not visualized. No aortic stenosis is present.   6. The inferior vena cava is normal in size with greater than 50%  respiratory variability, suggesting right atrial pressure of 3 mmHg.   Comparison(s): No prior Echocardiogram.  Lexiscan 07/2017:  Nonspecific ST segment depressions in I, II, aVL with nonspecific T wave abnormalities in leads I, 2, aVL, and precordial leads with T wave inversion in V6. Defect 1: There is a defect present in the basal inferior, basal inferolateral, mid anterior, mid inferior, mid inferolateral, apical anterior, apical septal, apical inferior and apical lateral location. Findings consistent with prior myocardial infarction with peri-infarct ischemia primarily in the inferolateral wall. This is an intermediate risk study. Nuclear stress EF: 54%.  Risk Assessment/Calculations:    CHA2DS2-VASc Score = 7   This indicates a 11.2% annual risk of stroke. The patient's score is based upon: CHF History: 1 HTN History: 1 Diabetes History: 1 Stroke History: 0 Vascular Disease History: 1 Age Score: 2 Gender Score: 1  Physical Exam:   VS:  BP 130/80   Pulse 68   Ht 5\' 1"  (1.549 m)   Wt 170 lb (77.1 kg)   SpO2 98%   BMI 32.12 kg/m    Wt Readings from Last 3 Encounters:  07/22/23 170 lb (77.1 kg)  07/12/23 174 lb 12.8 oz (79.3 kg)  06/19/23 184 lb 1.4 oz (83.5 kg)    GEN: Obese, 78 y.o. female who is blind, jittery and moving around frequently in wheelchair, looks uncomfortable, but in no acute distress NECK: No  JVD; No carotid bruits CARDIAC: S1/S2, RRR, no murmurs, rubs, gallops RESPIRATORY:  Clear to auscultation without rales, wheezing or rhonchi  ABDOMEN: Soft, non-tender, non-distended EXTREMITIES:  No edema; No deformity   ASSESSMENT AND PLAN: .    CAD Stable with no anginal symptoms. No indication for ischemic evaluation. See NST result mentioned above from 2019. Patient is not a good surgical candidate. Not on aspirin d/t being on Eliquis. Continue current medications. Heart healthy diet encouraged.   HFmrEF Stage C, NYHA class I symptoms. EF 45-50% 04/2023. Euvolemic and well compensated on exam. Weight is stable. Continue current medication regimen. Low sodium diet, fluid restriction <2L, and daily weights encouraged. Educated to contact our office for weight gain of 2 lbs overnight or 5 lbs in one week.   HTN BP stable and at goal. Discussed to monitor BP at home at least 2 hours after medications and sitting for 5-10 minutes. No medication changes at this time. Heart healthy diet encouraged.   HLD No recent lipid panel on file. Will request most recent  labs from PCP at next office visit. Continue current medication regimen.  Heart healthy diet encouraged.   PAF/ A-flutter Denies any tachycardia or palpitations. HR is well controlled today. Continue Amiodarone.  Continue Eliquis for stroke prevention.  Metoprolol was discontinued during past hospitalization due to bradycardia. Will obtain previous thyroid panel that was ordered.   Nausea, body aches, jittery   Etiology unclear. Recommended to f/u with PCP and GI for further evaluation. Getting labwork as mentioned above.   Dispo: Follow-up with me or APP in 3-4 months or sooner if anything changes.   Signed, Sharlene Dory, NP

## 2023-07-25 ENCOUNTER — Ambulatory Visit: Payer: Medicare Other | Admitting: Cardiology

## 2023-07-27 ENCOUNTER — Ambulatory Visit (HOSPITAL_COMMUNITY)
Admission: RE | Admit: 2023-07-27 | Discharge: 2023-07-27 | Disposition: A | Discharge status: Home / Self Care | Payer: Medicare Other | Source: Ambulatory Visit | Attending: Internal Medicine | Admitting: Internal Medicine

## 2023-07-27 DIAGNOSIS — I5022 Chronic systolic (congestive) heart failure: Secondary | ICD-10-CM | POA: Insufficient documentation

## 2023-07-27 LAB — ECHOCARDIOGRAM LIMITED: S' Lateral: 3.4 cm

## 2023-07-27 NOTE — Progress Notes (Signed)
*  PRELIMINARY RESULTS* Echocardiogram Limited 2-D Echo cardiogram  has been performed.  Stacey Drain 07/27/2023, 2:43 PM

## 2023-07-28 ENCOUNTER — Ambulatory Visit: Payer: Medicare Other | Admitting: Cardiology

## 2023-08-11 ENCOUNTER — Other Ambulatory Visit: Payer: Self-pay

## 2023-08-11 ENCOUNTER — Emergency Department (HOSPITAL_COMMUNITY)

## 2023-08-11 ENCOUNTER — Encounter (HOSPITAL_COMMUNITY): Payer: Self-pay

## 2023-08-11 ENCOUNTER — Inpatient Hospital Stay (HOSPITAL_COMMUNITY)
Admission: EM | Admit: 2023-08-11 | Discharge: 2023-08-30 | DRG: 871 | Disposition: E | Source: Skilled Nursing Facility | Attending: Family Medicine | Admitting: Family Medicine

## 2023-08-11 DIAGNOSIS — Z7901 Long term (current) use of anticoagulants: Secondary | ICD-10-CM

## 2023-08-11 DIAGNOSIS — N3091 Cystitis, unspecified with hematuria: Secondary | ICD-10-CM

## 2023-08-11 DIAGNOSIS — E8721 Acute metabolic acidosis: Secondary | ICD-10-CM | POA: Diagnosis present

## 2023-08-11 DIAGNOSIS — I480 Paroxysmal atrial fibrillation: Secondary | ICD-10-CM | POA: Diagnosis not present

## 2023-08-11 DIAGNOSIS — N179 Acute kidney failure, unspecified: Secondary | ICD-10-CM

## 2023-08-11 DIAGNOSIS — E870 Hyperosmolality and hypernatremia: Secondary | ICD-10-CM | POA: Diagnosis present

## 2023-08-11 DIAGNOSIS — N390 Urinary tract infection, site not specified: Secondary | ICD-10-CM | POA: Insufficient documentation

## 2023-08-11 DIAGNOSIS — F0390 Unspecified dementia without behavioral disturbance: Secondary | ICD-10-CM | POA: Diagnosis present

## 2023-08-11 DIAGNOSIS — Z90710 Acquired absence of both cervix and uterus: Secondary | ICD-10-CM

## 2023-08-11 DIAGNOSIS — K529 Noninfective gastroenteritis and colitis, unspecified: Secondary | ICD-10-CM | POA: Diagnosis not present

## 2023-08-11 DIAGNOSIS — I10 Essential (primary) hypertension: Secondary | ICD-10-CM | POA: Diagnosis present

## 2023-08-11 DIAGNOSIS — Z66 Do not resuscitate: Secondary | ICD-10-CM | POA: Diagnosis present

## 2023-08-11 DIAGNOSIS — Z79624 Long term (current) use of inhibitors of nucleotide synthesis: Secondary | ICD-10-CM

## 2023-08-11 DIAGNOSIS — Z515 Encounter for palliative care: Secondary | ICD-10-CM | POA: Diagnosis not present

## 2023-08-11 DIAGNOSIS — E1165 Type 2 diabetes mellitus with hyperglycemia: Secondary | ICD-10-CM | POA: Diagnosis present

## 2023-08-11 DIAGNOSIS — Z7952 Long term (current) use of systemic steroids: Secondary | ICD-10-CM

## 2023-08-11 DIAGNOSIS — E876 Hypokalemia: Secondary | ICD-10-CM | POA: Diagnosis present

## 2023-08-11 DIAGNOSIS — E861 Hypovolemia: Secondary | ICD-10-CM | POA: Diagnosis present

## 2023-08-11 DIAGNOSIS — Z1152 Encounter for screening for COVID-19: Secondary | ICD-10-CM | POA: Diagnosis not present

## 2023-08-11 DIAGNOSIS — E860 Dehydration: Secondary | ICD-10-CM | POA: Diagnosis present

## 2023-08-11 DIAGNOSIS — Z801 Family history of malignant neoplasm of trachea, bronchus and lung: Secondary | ICD-10-CM

## 2023-08-11 DIAGNOSIS — E66811 Obesity, class 1: Secondary | ICD-10-CM | POA: Diagnosis present

## 2023-08-11 DIAGNOSIS — L89152 Pressure ulcer of sacral region, stage 2: Secondary | ICD-10-CM | POA: Diagnosis present

## 2023-08-11 DIAGNOSIS — E119 Type 2 diabetes mellitus without complications: Secondary | ICD-10-CM

## 2023-08-11 DIAGNOSIS — A419 Sepsis, unspecified organism: Secondary | ICD-10-CM | POA: Diagnosis present

## 2023-08-11 DIAGNOSIS — N3001 Acute cystitis with hematuria: Secondary | ICD-10-CM | POA: Diagnosis present

## 2023-08-11 DIAGNOSIS — A4159 Other Gram-negative sepsis: Principal | ICD-10-CM | POA: Diagnosis present

## 2023-08-11 DIAGNOSIS — G9341 Metabolic encephalopathy: Secondary | ICD-10-CM | POA: Diagnosis not present

## 2023-08-11 DIAGNOSIS — J9601 Acute respiratory failure with hypoxia: Secondary | ICD-10-CM | POA: Diagnosis present

## 2023-08-11 DIAGNOSIS — E039 Hypothyroidism, unspecified: Secondary | ICD-10-CM | POA: Diagnosis present

## 2023-08-11 DIAGNOSIS — D649 Anemia, unspecified: Secondary | ICD-10-CM

## 2023-08-11 DIAGNOSIS — B961 Klebsiella pneumoniae [K. pneumoniae] as the cause of diseases classified elsewhere: Secondary | ICD-10-CM | POA: Diagnosis present

## 2023-08-11 DIAGNOSIS — K754 Autoimmune hepatitis: Secondary | ICD-10-CM | POA: Diagnosis present

## 2023-08-11 DIAGNOSIS — J440 Chronic obstructive pulmonary disease with acute lower respiratory infection: Secondary | ICD-10-CM | POA: Diagnosis present

## 2023-08-11 DIAGNOSIS — Z7189 Other specified counseling: Secondary | ICD-10-CM | POA: Diagnosis not present

## 2023-08-11 DIAGNOSIS — Z87891 Personal history of nicotine dependence: Secondary | ICD-10-CM

## 2023-08-11 DIAGNOSIS — D61818 Other pancytopenia: Secondary | ICD-10-CM | POA: Diagnosis present

## 2023-08-11 DIAGNOSIS — L8990 Pressure ulcer of unspecified site, unspecified stage: Secondary | ICD-10-CM

## 2023-08-11 DIAGNOSIS — E2740 Unspecified adrenocortical insufficiency: Secondary | ICD-10-CM | POA: Diagnosis present

## 2023-08-11 DIAGNOSIS — D62 Acute posthemorrhagic anemia: Secondary | ICD-10-CM | POA: Diagnosis present

## 2023-08-11 DIAGNOSIS — Y95 Nosocomial condition: Secondary | ICD-10-CM | POA: Diagnosis present

## 2023-08-11 DIAGNOSIS — K7200 Acute and subacute hepatic failure without coma: Secondary | ICD-10-CM

## 2023-08-11 DIAGNOSIS — J80 Acute respiratory distress syndrome: Secondary | ICD-10-CM | POA: Diagnosis present

## 2023-08-11 DIAGNOSIS — H548 Legal blindness, as defined in USA: Secondary | ICD-10-CM | POA: Diagnosis present

## 2023-08-11 DIAGNOSIS — J189 Pneumonia, unspecified organism: Principal | ICD-10-CM | POA: Diagnosis present

## 2023-08-11 DIAGNOSIS — K219 Gastro-esophageal reflux disease without esophagitis: Secondary | ICD-10-CM | POA: Diagnosis present

## 2023-08-11 DIAGNOSIS — Z8249 Family history of ischemic heart disease and other diseases of the circulatory system: Secondary | ICD-10-CM

## 2023-08-11 DIAGNOSIS — E785 Hyperlipidemia, unspecified: Secondary | ICD-10-CM | POA: Diagnosis not present

## 2023-08-11 DIAGNOSIS — I2510 Atherosclerotic heart disease of native coronary artery without angina pectoris: Secondary | ICD-10-CM | POA: Diagnosis present

## 2023-08-11 DIAGNOSIS — R6521 Severe sepsis with septic shock: Secondary | ICD-10-CM | POA: Diagnosis present

## 2023-08-11 DIAGNOSIS — E1129 Type 2 diabetes mellitus with other diabetic kidney complication: Secondary | ICD-10-CM | POA: Diagnosis present

## 2023-08-11 DIAGNOSIS — Z823 Family history of stroke: Secondary | ICD-10-CM

## 2023-08-11 DIAGNOSIS — Z6831 Body mass index (BMI) 31.0-31.9, adult: Secondary | ICD-10-CM

## 2023-08-11 DIAGNOSIS — Z885 Allergy status to narcotic agent status: Secondary | ICD-10-CM

## 2023-08-11 DIAGNOSIS — Z794 Long term (current) use of insulin: Secondary | ICD-10-CM

## 2023-08-11 DIAGNOSIS — B9689 Other specified bacterial agents as the cause of diseases classified elsewhere: Secondary | ICD-10-CM | POA: Diagnosis not present

## 2023-08-11 DIAGNOSIS — Z79899 Other long term (current) drug therapy: Secondary | ICD-10-CM

## 2023-08-11 LAB — CBC WITH DIFFERENTIAL/PLATELET
Abs Immature Granulocytes: 0.1 10*3/uL — ABNORMAL HIGH (ref 0.00–0.07)
Band Neutrophils: 17 %
Basophils Absolute: 0 10*3/uL (ref 0.0–0.1)
Basophils Relative: 0 %
Eosinophils Absolute: 0 10*3/uL (ref 0.0–0.5)
Eosinophils Relative: 1 %
HCT: 19.6 % — ABNORMAL LOW (ref 36.0–46.0)
Hemoglobin: 6.3 g/dL — CL (ref 12.0–15.0)
Lymphocytes Relative: 9 %
Lymphs Abs: 0.3 10*3/uL — ABNORMAL LOW (ref 0.7–4.0)
MCH: 40.6 pg — ABNORMAL HIGH (ref 26.0–34.0)
MCHC: 32.1 g/dL (ref 30.0–36.0)
MCV: 126.5 fL — ABNORMAL HIGH (ref 80.0–100.0)
Metamyelocytes Relative: 2 %
Monocytes Absolute: 0.1 10*3/uL (ref 0.1–1.0)
Monocytes Relative: 4 %
Myelocytes: 1 %
Neutro Abs: 2.7 10*3/uL (ref 1.7–7.7)
Neutrophils Relative %: 66 %
Platelets: 116 10*3/uL — ABNORMAL LOW (ref 150–400)
RBC: 1.55 MIL/uL — ABNORMAL LOW (ref 3.87–5.11)
RDW: 26.6 % — ABNORMAL HIGH (ref 11.5–15.5)
Smear Review: DECREASED
WBC: 3.3 10*3/uL — ABNORMAL LOW (ref 4.0–10.5)
nRBC: 3.4 % — ABNORMAL HIGH (ref 0.0–0.2)
nRBC: 5 /100{WBCs} — ABNORMAL HIGH

## 2023-08-11 LAB — COMPREHENSIVE METABOLIC PANEL
ALT: 107 U/L — ABNORMAL HIGH (ref 0–44)
AST: 279 U/L — ABNORMAL HIGH (ref 15–41)
Albumin: 2 g/dL — ABNORMAL LOW (ref 3.5–5.0)
Alkaline Phosphatase: 103 U/L (ref 38–126)
Anion gap: 13 (ref 5–15)
BUN: 45 mg/dL — ABNORMAL HIGH (ref 8–23)
CO2: 16 mmol/L — ABNORMAL LOW (ref 22–32)
Calcium: 7.8 mg/dL — ABNORMAL LOW (ref 8.9–10.3)
Chloride: 122 mmol/L — ABNORMAL HIGH (ref 98–111)
Creatinine, Ser: 1.75 mg/dL — ABNORMAL HIGH (ref 0.44–1.00)
GFR, Estimated: 30 mL/min — ABNORMAL LOW (ref >60)
Glucose, Bld: 123 mg/dL — ABNORMAL HIGH (ref 70–99)
Potassium: 4 mmol/L (ref 3.5–5.1)
Sodium: 151 mmol/L — ABNORMAL HIGH (ref 135–145)
Total Bilirubin: 2 mg/dL — ABNORMAL HIGH (ref 0.0–1.2)
Total Protein: 4.8 g/dL — ABNORMAL LOW (ref 6.5–8.1)

## 2023-08-11 LAB — RESP PANEL BY RT-PCR (RSV, FLU A&B, COVID)  RVPGX2
Influenza A by PCR: NEGATIVE
Influenza B by PCR: NEGATIVE
Resp Syncytial Virus by PCR: NEGATIVE
SARS Coronavirus 2 by RT PCR: NEGATIVE

## 2023-08-11 LAB — URINALYSIS, W/ REFLEX TO CULTURE (INFECTION SUSPECTED)
Bilirubin Urine: NEGATIVE
Glucose, UA: NEGATIVE mg/dL
Ketones, ur: NEGATIVE mg/dL
Leukocytes,Ua: NEGATIVE
Nitrite: NEGATIVE
Protein, ur: 100 mg/dL — AB
RBC / HPF: >50 RBC/hpf (ref 0–5)
Specific Gravity, Urine: 1.019 (ref 1.005–1.030)
WBC, UA: >50 WBC/hpf (ref 0–5)
pH: 5 (ref 5.0–8.0)

## 2023-08-11 LAB — LACTIC ACID, PLASMA
Lactic Acid, Venous: 3.3 mmol/L (ref 0.5–1.9)
Lactic Acid, Venous: 4.5 mmol/L (ref 0.5–1.9)

## 2023-08-11 LAB — PREPARE RBC (CROSSMATCH)

## 2023-08-11 LAB — PROTIME-INR
INR: 2.5 — ABNORMAL HIGH (ref 0.8–1.2)
Prothrombin Time: 27.5 s — ABNORMAL HIGH (ref 11.4–15.2)

## 2023-08-11 LAB — APTT: aPTT: 38 s — ABNORMAL HIGH (ref 24–36)

## 2023-08-11 MED ORDER — LACTATED RINGERS IV BOLUS (SEPSIS)
1000.0000 mL | Freq: Once | INTRAVENOUS | Status: AC
  Administered 2023-08-11: 1000 mL via INTRAVENOUS

## 2023-08-11 MED ORDER — SODIUM CHLORIDE 0.9 % IV SOLN
2.0000 g | Freq: Once | INTRAVENOUS | Status: AC
  Administered 2023-08-11: 2 g via INTRAVENOUS
  Filled 2023-08-11: qty 12.5

## 2023-08-11 MED ORDER — LACTATED RINGERS IV SOLN: INTRAVENOUS | Status: AC

## 2023-08-11 MED ORDER — VANCOMYCIN HCL 1500 MG/300ML IV SOLN
1500.0000 mg | Freq: Once | INTRAVENOUS | Status: AC
  Administered 2023-08-11: 1500 mg via INTRAVENOUS
  Filled 2023-08-11: qty 300

## 2023-08-11 MED ORDER — LACTATED RINGERS IV BOLUS (SEPSIS)
500.0000 mL | Freq: Once | INTRAVENOUS | Status: AC
  Administered 2023-08-11: 500 mL via INTRAVENOUS

## 2023-08-11 MED ORDER — FENTANYL CITRATE PF 50 MCG/ML IJ SOSY
12.5000 ug | PREFILLED_SYRINGE | Freq: Once | INTRAMUSCULAR | Status: AC
  Administered 2023-08-11: 12.5 ug via INTRAVENOUS
  Filled 2023-08-11: qty 1

## 2023-08-11 MED ORDER — SODIUM CHLORIDE 0.9% IV SOLUTION: Freq: Once | INTRAVENOUS | Status: AC

## 2023-08-11 NOTE — ED Triage Notes (Signed)
 Pt BIB ems from [facility] for increased AMS, hypotension, dried blood noted to mouth. Per EMS when questioning about how long pt has gone w/o oral care no one at the facility would speak to them just "took off down the hallway." Per [place] in report antibiotic was just ordered for the pt's UTI this morning by facility dr.

## 2023-08-11 NOTE — Sepsis Progress Note (Signed)
 Paramedic notified that repeat lactic acid is due to be drawn

## 2023-08-11 NOTE — Sepsis Progress Note (Signed)
 Notified paramedic that pt needs a 3rd lactic acid @ 1925

## 2023-08-11 NOTE — Sepsis Progress Note (Signed)
 Elink monitoring for the code sepsis protocol.

## 2023-08-11 NOTE — Sepsis Progress Note (Signed)
 Asking paramedic to check with lab about results of lactic acid drawn @ 1554

## 2023-08-11 NOTE — ED Notes (Signed)
 Patients sister called for an update if she gets moved to [facility] please call her and let her know. Not [place].

## 2023-08-11 NOTE — Sepsis Progress Note (Signed)
 Notified provider of need to order repeat lactic acid #3 at 1925.

## 2023-08-11 NOTE — Progress Notes (Signed)
 ED Pharmacy Antibiotic Sign Off An antibiotic consult was received from an ED provider for vancomycin per pharmacy dosing for pneumonia. A chart review was completed to assess appropriateness.   The following one time order(s) were placed:  Vancomycin 1500mg  IV x1  Further antibiotic and/or antibiotic pharmacy consults should be ordered by the admitting provider if indicated.   Thank you for allowing pharmacy to be a part of this patient's care.   Vernard Gambles, PharmD, BCPS   Clinical Pharmacist 08/11/23 11:14 PM

## 2023-08-11 NOTE — ED Provider Notes (Signed)
 [place] EMERGENCY DEPARTMENT AT [facility] Provider Note   CSN: [id number] Arrival date & time: 08/11/23  1501     History {Add pertinent medical, surgical, social history, OB history to HPI:1} Chief Complaint  Patient presents with   Hypotension    [name] is a 78 y.o. female.  78 year old female with past medical history of atrial fibrillation on Eliquis as well as diabetes presenting to the emergency department today with concern for altered mental status and hypotension from her nursing home.  The patient was apparently diagnosed with a UTI over the past few days.  She was started on some oral antibiotics but is unclear if she has been taking these.  Family went to see her today and noted that she was less responsive than normal and had some blood in her mouth.  She was sent to the ER at that time for further evaluation.  The patient is unable to provide any history.        Home Medications Prior to Admission medications   Medication Sig Start Date End Date Taking? Authorizing Provider  acetaminophen (TYLENOL) 325 MG tablet Take 2 tablets (650 mg total) by mouth every 6 (six) hours as needed for mild pain (pain score 1-3) (or Fever >/= 101). 07/14/23   Vassie Loll, MD  albuterol (VENTOLIN HFA) 108 (90 Base) MCG/ACT inhaler Inhale 2 puffs into the lungs every 6 (six) hours as needed for wheezing or shortness of breath. 07/02/21   Vassie Loll, MD  amiodarone (PACERONE) 200 MG tablet Take 1 tablet (200 mg total) by mouth 2 (two) times daily for 21 days, THEN 1 tablet (200 mg total) daily for 9 days. and continue 200mg  daily thereafter. 06/20/23   Tyrone Nine, MD  amitriptyline (ELAVIL) 50 MG tablet Take 50 mg by mouth at bedtime.    [provider]  apixaban (ELIQUIS) 5 MG TABS tablet Take 1 tablet (5 mg total) by mouth 2 (two) times daily. 09/27/22   Shahmehdi, Gemma Payor, MD  azaTHIOprine (IMURAN) 50 MG tablet Take 3 tablets (150 mg total) by mouth  daily. 04/28/22   Carlan, Chelsea L, NP  bisacodyl (DULCOLAX) 5 MG EC tablet Take 5 mg by mouth in the morning and at bedtime.    [provider]  calcium carbonate (TUMS - DOSED IN MG ELEMENTAL CALCIUM) 500 MG chewable tablet Chew 1,000 mg by mouth every 8 (eight) hours as needed for heartburn.    [provider]  Cholecalciferol 50 MCG (2000 UT) TABS Take 2,000 Units by mouth daily at 6 (six) AM.    [provider]  ezetimibe (ZETIA) 10 MG tablet Take 1 tablet (10 mg total) by mouth daily. 12/29/21   Antoine Poche, MD  hydrALAZINE (APRESOLINE) 50 MG tablet Take 1 tablet (50 mg total) by mouth every 8 (eight) hours. 04/29/23   Catarina Hartshorn, MD  insulin lispro (HUMALOG) 100 UNIT/ML KwikPen Inject 2-10 Units into the skin 3 (three) times daily before meals. CBG < 199 = 0 units, 200-251 = 2 units, 251-300 = 4 units,301-350 = 6 units, 351-400 = 8 units, 401-500 = 10 units.    [provider]  iron polysaccharides (NIFEREX) 150 MG capsule Take 1 capsule (150 mg total) by mouth daily. 07/15/23   Vassie Loll, MD  loratadine (CLARITIN) 10 MG tablet Take 10 mg by mouth daily.    [provider]  losartan (COZAAR) 50 MG tablet Take 1 tablet (50 mg total) by mouth  daily. 04/29/23   Catarina Hartshorn, MD  magnesium oxide (MAG-OX) 400 (240 Mg) MG tablet Take 1 tablet (400 mg total) by mouth daily. 04/28/23   Catarina Hartshorn, MD  melatonin 5 MG TABS Take 5 mg by mouth at bedtime.    [provider]  ondansetron (ZOFRAN) 4 MG tablet Take 4 mg by mouth every 8 (eight) hours as needed for nausea or vomiting. 05/10/23   [provider]  polyethylene glycol (MIRALAX / GLYCOLAX) 17 g packet Take 17 g by mouth 2 (two) times daily.    [provider]  potassium chloride (MICRO-K) 10 MEQ CR capsule Take 20 mEq by mouth 2 (two) times daily. 11/25/22   [provider]  predniSONE (DELTASONE) 10 MG tablet Take 10 mg by mouth daily. 12/28/22   [provider]  senna (SENOKOT) 8.6 MG TABS tablet Take 2 tablets by mouth daily.    [provider]  sertraline (ZOLOFT) 50 MG tablet Take 50 mg by mouth daily. 06/09/21   [provider]      Allergies    Codeine    Review of Systems   Review of Systems  Reason unable to perform ROS: Mental status.    Physical Exam Updated Vital Signs BP (!) 62/28   Pulse 82   Temp 97.9 F (36.6 C) (Oral)   Resp 20   Ht 5\' 1"  (1.549 m)   Wt 77.1 kg   SpO2 (!) 89%   BMI 32.12 kg/m  Physical Exam Vitals and nursing note reviewed.   Gen: NAD, chronically ill-appearing Eyes: PERRL, EOMI HEENT: There is dried blood noted in the patient's mouth with no obvious injuries, mucous membranes are very dry Neck: trachea midline, no meningismus Resp: clear to auscultation bilaterally Card: RRR, no murmurs, rubs, or gallops Abd: nontender, nondistended Extremities: no calf tenderness, no edema Vascular: 2+ radial pulses bilaterally, 2+ DP pulses bilaterally Neuro: No focal deficits Skin: no rashes   ED Results / Procedures / Treatments   Labs (all labs ordered are listed, but only abnormal results are displayed) Labs Reviewed  RESP PANEL BY RT-PCR (RSV, FLU A&B, COVID)  [id number]  CULTURE, BLOOD (ROUTINE X 2)  CULTURE, BLOOD (ROUTINE X 2)  LACTIC ACID, PLASMA  LACTIC ACID, PLASMA  COMPREHENSIVE METABOLIC PANEL  CBC WITH DIFFERENTIAL/PLATELET  PROTIME-INR  APTT  URINALYSIS, W/ REFLEX TO CULTURE (INFECTION SUSPECTED)    EKG None  Radiology No results found.  Procedures Procedures  {Document cardiac monitor, telemetry assessment procedure when appropriate:1}  Medications Ordered in ED Medications  lactated ringers infusion (has no administration in time range)  lactated ringers bolus 1,000 mL (has no administration in time range)    And  lactated ringers bolus 1,000 mL (has no administration in time range)    And  lactated ringers bolus 500 mL (has no  administration in time range)  ceFEPIme (MAXIPIME) 2 g in sodium chloride 0.9 % 100 mL IVPB (has no administration in time range)    ED Course/ Medical Decision Making/ A&P   {   Click here for ABCD2, HEART and other calculatorsREFRESH Note before signing :1}                              Medical Decision Making 78 year old female with past medical history of atrial fibrillation on Eliquis and dementia presenting to the emergency department today with hypotension in setting of recently diagnosed UTI.  I will  initiate a sepsis workup on the patient.  Will cover empirically with cefepime.  I will give patient sepsis fluids.  Will obtain a chest x-ray as well as patient's pulse ox on room air is 89%.  She will require admission.  Amount and/or Complexity of Data Reviewed Labs: ordered. Radiology: ordered.  Risk Prescription drug management.   ***  {Document critical care time when appropriate:1} {Document review of labs and clinical decision tools ie heart score, Chads2Vasc2 etc:1}  {Document your independent review of radiology images, and any outside records:1} {Document your discussion with family members, caretakers, and with consultants:1} {Document social determinants of health affecting pt's care:1} {Document your decision making why or why not admission, treatments were needed:1} Final Clinical Impression(s) / ED Diagnoses Final diagnoses:  None    Rx / DC Orders ED Discharge Orders     None
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
## 2023-08-12 ENCOUNTER — Inpatient Hospital Stay (HOSPITAL_COMMUNITY)

## 2023-08-12 DIAGNOSIS — E2740 Unspecified adrenocortical insufficiency: Secondary | ICD-10-CM

## 2023-08-12 DIAGNOSIS — N179 Acute kidney failure, unspecified: Secondary | ICD-10-CM

## 2023-08-12 DIAGNOSIS — E8721 Acute metabolic acidosis: Secondary | ICD-10-CM | POA: Diagnosis not present

## 2023-08-12 DIAGNOSIS — A419 Sepsis, unspecified organism: Secondary | ICD-10-CM | POA: Diagnosis not present

## 2023-08-12 DIAGNOSIS — K529 Noninfective gastroenteritis and colitis, unspecified: Secondary | ICD-10-CM

## 2023-08-12 DIAGNOSIS — R6521 Severe sepsis with septic shock: Secondary | ICD-10-CM | POA: Diagnosis not present

## 2023-08-12 DIAGNOSIS — E870 Hyperosmolality and hypernatremia: Secondary | ICD-10-CM

## 2023-08-12 DIAGNOSIS — N3091 Cystitis, unspecified with hematuria: Secondary | ICD-10-CM

## 2023-08-12 DIAGNOSIS — D62 Acute posthemorrhagic anemia: Secondary | ICD-10-CM

## 2023-08-12 DIAGNOSIS — I480 Paroxysmal atrial fibrillation: Secondary | ICD-10-CM

## 2023-08-12 DIAGNOSIS — E785 Hyperlipidemia, unspecified: Secondary | ICD-10-CM

## 2023-08-12 DIAGNOSIS — D61818 Other pancytopenia: Secondary | ICD-10-CM

## 2023-08-12 DIAGNOSIS — E119 Type 2 diabetes mellitus without complications: Secondary | ICD-10-CM

## 2023-08-12 DIAGNOSIS — L8990 Pressure ulcer of unspecified site, unspecified stage: Secondary | ICD-10-CM

## 2023-08-12 LAB — BASIC METABOLIC PANEL
Anion gap: 15 (ref 5–15)
BUN: 46 mg/dL — ABNORMAL HIGH (ref 8–23)
CO2: 16 mmol/L — ABNORMAL LOW (ref 22–32)
Calcium: 7.7 mg/dL — ABNORMAL LOW (ref 8.9–10.3)
Chloride: 121 mmol/L — ABNORMAL HIGH (ref 98–111)
Creatinine, Ser: 1.67 mg/dL — ABNORMAL HIGH (ref 0.44–1.00)
GFR, Estimated: 32 mL/min — ABNORMAL LOW (ref >60)
Glucose, Bld: 120 mg/dL — ABNORMAL HIGH (ref 70–99)
Potassium: 3.9 mmol/L (ref 3.5–5.1)
Sodium: 152 mmol/L — ABNORMAL HIGH (ref 135–145)

## 2023-08-12 LAB — CBC
HCT: 24.4 % — ABNORMAL LOW (ref 36.0–46.0)
Hemoglobin: 8.3 g/dL — ABNORMAL LOW (ref 12.0–15.0)
MCH: 37.9 pg — ABNORMAL HIGH (ref 26.0–34.0)
MCHC: 34 g/dL (ref 30.0–36.0)
MCV: 111.4 fL — ABNORMAL HIGH (ref 80.0–100.0)
Platelets: 108 10*3/uL — ABNORMAL LOW (ref 150–400)
RBC: 2.19 MIL/uL — ABNORMAL LOW (ref 3.87–5.11)
RDW: 25.9 % — ABNORMAL HIGH (ref 11.5–15.5)
WBC: 3.4 10*3/uL — ABNORMAL LOW (ref 4.0–10.5)
nRBC: 5.6 % — ABNORMAL HIGH (ref 0.0–0.2)

## 2023-08-12 LAB — MRSA NEXT GEN BY PCR, NASAL: MRSA by PCR Next Gen: DETECTED — AB

## 2023-08-12 LAB — BLOOD GAS, ARTERIAL
Acid-base deficit: 6.5 mmol/L — ABNORMAL HIGH (ref 0.0–2.0)
Bicarbonate: 16.2 mmol/L — ABNORMAL LOW (ref 20.0–28.0)
Drawn by: 28340
O2 Saturation: 97.5 %
Patient temperature: 37.5
pCO2 arterial: 26 mmHg — ABNORMAL LOW (ref 32–48)
pH, Arterial: 7.41 (ref 7.35–7.45)
pO2, Arterial: 77 mmHg — ABNORMAL LOW (ref 83–108)

## 2023-08-12 LAB — PROTIME-INR
INR: 2.8 — ABNORMAL HIGH (ref 0.8–1.2)
Prothrombin Time: 29.4 s — ABNORMAL HIGH (ref 11.4–15.2)

## 2023-08-12 LAB — CORTISOL-AM, BLOOD: Cortisol - AM: 33.2 ug/dL — ABNORMAL HIGH (ref 6.7–22.6)

## 2023-08-12 LAB — PREPARE RBC (CROSSMATCH)

## 2023-08-12 LAB — HEMOGLOBIN AND HEMATOCRIT, BLOOD
HCT: 22.8 % — ABNORMAL LOW (ref 36.0–46.0)
HCT: 27.6 % — ABNORMAL LOW (ref 36.0–46.0)
Hemoglobin: 7.8 g/dL — ABNORMAL LOW (ref 12.0–15.0)
Hemoglobin: 9.8 g/dL — ABNORMAL LOW (ref 12.0–15.0)

## 2023-08-12 LAB — AMMONIA: Ammonia: 54 umol/L — ABNORMAL HIGH (ref 9–35)

## 2023-08-12 LAB — LACTIC ACID, PLASMA: Lactic Acid, Venous: 1.7 mmol/L (ref 0.5–1.9)

## 2023-08-12 MED ORDER — MAGNESIUM HYDROXIDE 400 MG/5ML PO SUSP: 30.0000 mL | Freq: Every day | ORAL | Status: DC | PRN

## 2023-08-12 MED ORDER — SENNA 8.6 MG PO TABS: 2.0000 | ORAL_TABLET | Freq: Every day | ORAL | Status: DC

## 2023-08-12 MED ORDER — SODIUM CHLORIDE 0.9 % IV SOLN: 250.0000 mL | INTRAVENOUS | Status: AC

## 2023-08-12 MED ORDER — LOSARTAN POTASSIUM 50 MG PO TABS: 50.0000 mg | ORAL_TABLET | Freq: Every day | ORAL | Status: DC

## 2023-08-12 MED ORDER — NOREPINEPHRINE 4 MG/250ML-% IV SOLN: 0.0000 ug/min | INTRAVENOUS | Status: DC

## 2023-08-12 MED ORDER — MELATONIN 5 MG PO TABS: 5.0000 mg | ORAL_TABLET | Freq: Every day | ORAL | Status: DC

## 2023-08-12 MED ORDER — LACTATED RINGERS IV BOLUS
1000.0000 mL | Freq: Once | INTRAVENOUS | Status: AC
  Administered 2023-08-12: 1000 mL via INTRAVENOUS

## 2023-08-12 MED ORDER — CALCIUM CARBONATE ANTACID 500 MG PO CHEW: 1000.0000 mg | CHEWABLE_TABLET | Freq: Three times a day (TID) | ORAL | Status: DC | PRN

## 2023-08-12 MED ORDER — CHLORHEXIDINE GLUCONATE CLOTH 2 % EX PADS
6.0000 | MEDICATED_PAD | Freq: Every day | CUTANEOUS | Status: DC
  Administered 2023-08-12 – 2023-08-15 (×4): 6 via TOPICAL

## 2023-08-12 MED ORDER — ACETAMINOPHEN 325 MG PO TABS: 650.0000 mg | ORAL_TABLET | Freq: Four times a day (QID) | ORAL | Status: DC | PRN

## 2023-08-12 MED ORDER — HYDRALAZINE HCL 50 MG PO TABS: 50.0000 mg | ORAL_TABLET | Freq: Three times a day (TID) | ORAL | Status: DC

## 2023-08-12 MED ORDER — ALBUTEROL SULFATE (2.5 MG/3ML) 0.083% IN NEBU: 2.5000 mg | INHALATION_SOLUTION | Freq: Four times a day (QID) | RESPIRATORY_TRACT | Status: DC | PRN

## 2023-08-12 MED ORDER — EZETIMIBE 10 MG PO TABS: 10.0000 mg | ORAL_TABLET | Freq: Every day | ORAL | Status: DC

## 2023-08-12 MED ORDER — ALBUTEROL SULFATE HFA 108 (90 BASE) MCG/ACT IN AERS: 2.0000 | INHALATION_SPRAY | Freq: Four times a day (QID) | RESPIRATORY_TRACT | Status: DC | PRN

## 2023-08-12 MED ORDER — LORATADINE 10 MG PO TABS: 10.0000 mg | ORAL_TABLET | Freq: Every day | ORAL | Status: DC

## 2023-08-12 MED ORDER — SODIUM CHLORIDE 0.9 % IV SOLN
2.0000 g | INTRAVENOUS | Status: DC
  Administered 2023-08-12 – 2023-08-14 (×3): 2 g via INTRAVENOUS
  Filled 2023-08-12 (×3): qty 20

## 2023-08-12 MED ORDER — LACTATED RINGERS IV SOLN: 150.0000 mL/h | INTRAVENOUS | Status: DC

## 2023-08-12 MED ORDER — VITAMIN D 25 MCG (1000 UNIT) PO TABS: 2000.0000 [IU] | ORAL_TABLET | Freq: Every day | ORAL | Status: DC

## 2023-08-12 MED ORDER — SERTRALINE HCL 50 MG PO TABS: 50.0000 mg | ORAL_TABLET | Freq: Every day | ORAL | Status: DC

## 2023-08-12 MED ORDER — HALOPERIDOL LACTATE 5 MG/ML IJ SOLN
1.0000 mg | Freq: Four times a day (QID) | INTRAMUSCULAR | Status: DC | PRN
  Administered 2023-08-12 – 2023-08-15 (×3): 1 mg via INTRAMUSCULAR
  Filled 2023-08-12 (×3): qty 1

## 2023-08-12 MED ORDER — ENOXAPARIN SODIUM 30 MG/0.3ML IJ SOSY: 30.0000 mg | PREFILLED_SYRINGE | INTRAMUSCULAR | Status: DC

## 2023-08-12 MED ORDER — AMIODARONE HCL 200 MG PO TABS: 200.0000 mg | ORAL_TABLET | Freq: Every day | ORAL | Status: DC

## 2023-08-12 MED ORDER — POTASSIUM CHLORIDE CRYS ER 20 MEQ PO TBCR: 20.0000 meq | EXTENDED_RELEASE_TABLET | Freq: Two times a day (BID) | ORAL | Status: DC

## 2023-08-12 MED ORDER — ONDANSETRON HCL 4 MG/2ML IJ SOLN: 4.0000 mg | Freq: Four times a day (QID) | INTRAMUSCULAR | Status: DC | PRN

## 2023-08-12 MED ORDER — SODIUM BICARBONATE 8.4 % IV SOLN
INTRAVENOUS | Status: AC
  Filled 2023-08-12: qty 150

## 2023-08-12 MED ORDER — TRAZODONE HCL 50 MG PO TABS: 25.0000 mg | ORAL_TABLET | Freq: Every evening | ORAL | Status: DC | PRN

## 2023-08-12 MED ORDER — SODIUM CHLORIDE 0.9 % IV SOLN: INTRAVENOUS | Status: DC | PRN

## 2023-08-12 MED ORDER — MUPIROCIN 2 % EX OINT
TOPICAL_OINTMENT | Freq: Two times a day (BID) | CUTANEOUS | Status: DC
  Administered 2023-08-13 – 2023-08-14 (×2): 1 via NASAL
  Filled 2023-08-12: qty 22

## 2023-08-12 MED ORDER — ONDANSETRON HCL 4 MG PO TABS: 4.0000 mg | ORAL_TABLET | Freq: Four times a day (QID) | ORAL | Status: DC | PRN

## 2023-08-12 MED ORDER — SODIUM CHLORIDE 0.9% IV SOLUTION: Freq: Once | INTRAVENOUS | Status: DC

## 2023-08-12 MED ORDER — SODIUM CHLORIDE 0.9 % IV SOLN
500.0000 mg | INTRAVENOUS | Status: DC
  Administered 2023-08-12 – 2023-08-15 (×4): 500 mg via INTRAVENOUS
  Filled 2023-08-12 (×4): qty 5

## 2023-08-12 MED ORDER — LACTULOSE 10 GM/15ML PO SOLN: 30.0000 g | Freq: Two times a day (BID) | ORAL | Status: DC

## 2023-08-12 MED ORDER — ACETAMINOPHEN 650 MG RE SUPP
650.0000 mg | Freq: Four times a day (QID) | RECTAL | Status: DC | PRN
  Administered 2023-08-13: 650 mg via RECTAL
  Filled 2023-08-12: qty 1

## 2023-08-12 MED ORDER — SODIUM BICARBONATE 8.4 % IV SOLN
INTRAVENOUS | Status: DC
  Filled 2023-08-12 (×3): qty 1000

## 2023-08-12 MED ORDER — NOREPINEPHRINE 4 MG/250ML-% IV SOLN
INTRAVENOUS | Status: AC
  Filled 2023-08-12: qty 250

## 2023-08-12 MED ORDER — POLYETHYLENE GLYCOL 3350 17 G PO PACK: 17.0000 g | PACK | Freq: Two times a day (BID) | ORAL | Status: DC

## 2023-08-12 MED ORDER — AZATHIOPRINE 50 MG PO TABS
150.0000 mg | ORAL_TABLET | Freq: Every day | ORAL | Status: DC
  Filled 2023-08-12: qty 3

## 2023-08-12 MED ORDER — LORAZEPAM 2 MG/ML IJ SOLN
0.5000 mg | Freq: Four times a day (QID) | INTRAMUSCULAR | Status: DC | PRN
  Administered 2023-08-12 – 2023-08-15 (×5): 0.5 mg via INTRAVENOUS
  Filled 2023-08-12 (×5): qty 1

## 2023-08-12 MED ORDER — HYDROCORTISONE SOD SUC (PF) 250 MG IJ SOLR
150.0000 mg | INTRAMUSCULAR | Status: AC
  Administered 2023-08-12: 150 mg via INTRAVENOUS
  Filled 2023-08-12: qty 150

## 2023-08-12 MED ORDER — NOREPINEPHRINE 4 MG/250ML-% IV SOLN
2.0000 ug/min | INTRAVENOUS | Status: DC
  Administered 2023-08-12: 2 ug/min via INTRAVENOUS
  Administered 2023-08-13 – 2023-08-14 (×2): 4 ug/min via INTRAVENOUS
  Filled 2023-08-12 (×4): qty 250

## 2023-08-12 MED ORDER — MAGNESIUM OXIDE -MG SUPPLEMENT 400 (240 MG) MG PO TABS: 400.0000 mg | ORAL_TABLET | Freq: Every day | ORAL | Status: DC

## 2023-08-12 NOTE — Assessment & Plan Note (Addendum)
 On admission. The patient will be placed on supplemental coverage with NovoLog.  08-13-2023 continue sliding scale insulin

## 2023-08-12 NOTE — Subjective & Objective (Addendum)
 Pt seen and examined. No family at bedside. Pt was made DNR/DNI by pt's two sisters yesterday. Overnight given 1 additional unit PRBC. This AM, HgB up to 9.4 g/dl. Levophed weaned to 4 mcg/kg/min.

## 2023-08-12 NOTE — Progress Notes (Signed)
   Spoke with pt's two sister. They confirm that pt would want to be DNR/DNI. Code status changed to DNR/DNI.  Carollee Herter, DO [facility]

## 2023-08-12 NOTE — Hospital Course (Addendum)
 78 y.o. female with medical history significant for blindness; depression and anxiety, COPD, diabetes mellitus, autoimmune hepatitis steroid dependent; GERD, hypertension, dyslipidemia and hypothyroidism, who presented to the emergency room with acute onset of altered mental status with hypotension at her SNF.  She was initially diagnosed with UTI over the last few days.  She was started on oral antibiotic therapy.  Her family went to see her today and noted that she was less responsive than normal and had blood in her mouth.  The patient was nonverbal and unresponsive.  She was admitted with septic shock, hemorrhagic cystitis, pneumonia and colitis.

## 2023-08-12 NOTE — Assessment & Plan Note (Addendum)
 On admission. She may benefit from hematology consultation.  This can be called later this morning.  08-12-2023 will hold off on heme/onc consult given pt's septic shock. Not sure she is stable for any kind of workup.  08-13-2023 improved.  White count up to 4.  Hemoglobin stable at 9.4 g/dL status post 2 units of packed cells.  Platelets at 76,000.  Continue to hold all heparin products, Eliquis.

## 2023-08-12 NOTE — Progress Notes (Signed)
 Arterial line placement attempted x4 between 2 Respiratory Therapists. Unable to obtain line at this time. Dr Imogene Burn aware.

## 2023-08-12 NOTE — Assessment & Plan Note (Addendum)
 On admission. We will continue Zetia.  08-12-2023 given pt's septic shock, will withhold all unnecessary medications until her clinical condition stabilizes.  08-13-2023 Continue to hold cholesterol medications due to elevated LFTs.

## 2023-08-12 NOTE — Plan of Care (Signed)
  Problem: Clinical Measurements: Goal: Ability to maintain clinical measurements within normal limits will improve Outcome: Progressing Goal: Will remain free from infection Outcome: Progressing Goal: Diagnostic test results will improve Outcome: Progressing Goal: Respiratory complications will improve Outcome: Progressing Goal: Cardiovascular complication will be avoided Outcome: Progressing   Problem: Coping: Goal: Level of anxiety will decrease Outcome: Progressing   Problem: Elimination: Goal: Will not experience complications related to bowel motility Outcome: Progressing Goal: Will not experience complications related to urinary retention Outcome: Progressing   Problem: Pain Managment: Goal: General experience of comfort will improve and/or be controlled Outcome: Progressing   Problem: Safety: Goal: Ability to remain free from injury will improve Outcome: Progressing   Problem: Skin Integrity: Goal: Risk for impaired skin integrity will decrease Outcome: Progressing

## 2023-08-12 NOTE — Assessment & Plan Note (Addendum)
 On admission. - The patient was typed and crossmatch and will be transfused 1 unit packed red blood cells. - We will follow posttransfusion H&H. - We will hold off blood thinners.  08-12-2023 may need to give more PRBC transfusion today. If she remains hypotensive.  08-13-2023 was transfused with 2 units of packed cells yesterday.  This morning hemoglobin of 9.4 grams per deciliter.  This is all due to her hemorrhagic cystitis.  Continue to hold Eliquis.  No need for Kcentra at this point.  Monitor hemoglobin.

## 2023-08-12 NOTE — Assessment & Plan Note (Addendum)
 On admission. - This is likely prerenal due to volume depletion and dehydration. - We will continue hydration with IV LR will follow BMP.  08-13-2023  Creatinine down to 1.57.  Was 1.75 on admission.  This is due to her volume depletion, dehydration and hypovolemia.  Likely due to ATN from shock.  Continue with IV fluids with LR.  Patient has a Foley catheter now.

## 2023-08-12 NOTE — Assessment & Plan Note (Addendum)
 08-12-2023 continue with IV rocephin/flagyl  08-13-2023 continue IV Rocephin Flagyl.  Patient still too encephalopathic to tolerate p.o.

## 2023-08-12 NOTE — Progress Notes (Addendum)
 PROGRESS NOTE    SHANDIIN EISENBEIS  MWU:[id number] DOB: 1945-12-14 DOA: 08/11/2023 PCP: Galvin Proffer, MD  Subjective: Pt seen and examined. I was called emergently to bedside. Later on I spoke to pt's sister Raynelle Fanning at bedside. Emergent CVL had to be placed due to persistent low BP requiring IV levophed  Pt unresponsive.   Hospital Course: HPI: [name] is a 78 y.o. female with medical history significant for anxiety, COPD, diabetes mellitus, GERD, hypertension, blood in this dyslipidemia and hypothyroidism, who presented to the emergency room with acute onset of altered mental status with hypotension at her [facility].  She was initially diagnosed with UTI over the last few days.  She was started on oral antibiotic therapy.  Her family went to see her today and noted that she was less responsive than normal and had blood in her mouth.  The patient is nonverbal and therefore no history could be obtained.   Significant Events: Admitted 08/11/2023 for septic shock   Significant Labs: Na 151, K 4, CO2 of 16, BUN 45, scr 1.75, glu 123, Cl 122 WBC 3.3, HgB 6.3 Plt 116 ABG pH 7.41, PCO2 of 26, PO2 77 INR 2.8  Significant Imaging Studies: CXR Low lung volumes without acute chest findings.  CT abd  Marked wall thickening/edema of the ascending colon, hepatic flexure and proximal transverse colon compatible with colitis. No bowel obstruction or free air. 2. Moderate amount of free fluid throughout the abdomen and pelvis. 3. Mild patchy airspace opacities in both lung bases, worrisome for pneumonia. 4. New thin-walled cyst in the right lower lobe measuring 2.6 cm. 5. Aortic atherosclerosis. CT head No acute intracranial abnormality. 2. Generalized atrophy and findings of chronic microvascular disease.  Antibiotic Therapy: Anti-infectives (From admission, onward)    Start     Dose/Rate Route Frequency Ordered Stop   08/12/23 1600  cefTRIAXone (ROCEPHIN) 2 g in sodium chloride 0.9 % 100 mL IVPB         2 g 200 mL/hr over 30 Minutes Intravenous Every 24 hours 08/12/23 0423 08/17/23 1559   08/12/23 0500  azithromycin (ZITHROMAX) 500 mg in sodium chloride 0.9 % 250 mL IVPB        500 mg 250 mL/hr over 60 Minutes Intravenous Every 24 hours 08/12/23 0423 08/17/23 0459   08/11/23 2315  vancomycin (VANCOREADY) IVPB 1500 mg/300 mL        1,500 mg 150 mL/hr over 120 Minutes Intravenous  Once 08/11/23 2314 08/12/23 0215   08/11/23 1530  ceFEPIme (MAXIPIME) 2 g in sodium chloride 0.9 % 100 mL IVPB        2 g 200 mL/hr over 30 Minutes Intravenous  Once 08/11/23 1518 08/11/23 1730       Procedures: 08-12-2023 left internal jugular CVL  Consultants:     Assessment and Plan: * Septic shock (HCC) On admission. This secondary to UTI with acute hemorrhagic cystitis as well as bibasilar pneumonia and acute colitis. - Sepsis manifested by tachypnea and leukopenia.  Lactic acid was more than 4.  She was initially hypotensive with a BP of 82/58.  She responded to IV fluid boluses. - Patient will be admitted to a stepdown unit bed. - We will continue aggressive hydration with IV lactated ringer. - Will continue antibiotic therapy with IV Rocephin, Zithromax and Flagyl. - We will follow blood and urine cultures.  08-12-2023 called emergent to bedside to place emergency CVL. Left internal jugular CVL placed. Start IV levophed. Stop Eliquis. Continue with  IV ABX pending blood and urine cx. Order arterial line.  Acute metabolic acidosis 08-12-2023 unclear the cause. May be due to septic shock vs lactic acidosis or other etiology. On IV bicarb gtts for now.  Hypernatremia 08-12-2023 after septic shock has resolved, will give more free water.  Acute colitis 08-12-2023 continue with IV rocephin/flagyl  Hemorrhagic cystitis 08-11-2023 continue with IV rocephin. Awaiting urine cx  Adrenal insufficiency (HCC) 08-12-2023 dx dates as far back as 2018.  Has low AM cortisol of 2.7.  am cortisol level  drawn this AM. Will give 150 mg IV hydrocortisone now. Given pt's septic shock. Even normal cortisol level would signal relative adrenal insufficiency.,  Pancytopenia (HCC) On admission. She may benefit from hematology consultation.  This can be called later this morning.  08-12-2023 will hold off on heme/onc consult given pt's septic shock. Not sure she is stable for any kind of workup.  AKI (acute kidney injury) (HCC) On admission. - This is likely prerenal due to volume depletion and dehydration. - We will continue hydration with IV LR will follow BMP.  Acute blood loss anemia (ABLA) On admission. - The patient was typed and crossmatch and will be transfused 1 unit packed red blood cells. - We will follow posttransfusion H&H. - We will hold off blood thinners.  08-12-2023 may need to give more PRBC transfusion today. If she remains hypotensive.    Paroxysmal atrial fibrillation (HCC) On admission. We will continue amiodarone and hold off Eliquis.  08-12-2023 DC eliquis. Continue amio. May need to change to IV if pt unable to take po.  Dyslipidemia On admission. We will continue Zetia.  08-12-2023 given pt's septic shock, will withhold all unnecessary medications until her clinical condition stabilizes.  Type 2 diabetes mellitus without complications (HCC) On admission. The patient will be placed on supplemental coverage with NovoLog.   Essential hypertension On admission. will continue antihypertensive therapy while holding off nephrotoxins.Marland Kitchen  08-12-2023 given pt's septic shock, will withhold all unnecessary medications until her clinical condition stabilizes.  Obesity, Class I, BMI 30-34.9 Estimated body mass index is 31.03 kg/m as calculated from the following:   Height as of this encounter: 5\' 1"  (1.549 m).   Weight as of this encounter: 74.5 kg.    DVT prophylaxis:   SCDs   Code Status: Full Code Family Communication: discussed with pt's sister Mona at  bedside Disposition Plan: unknown Reason for continuing need for hospitalization: remains critically  ill. On IV levophed and bicarb gtts  Objective: Vitals:   08/12/23 0730 08/12/23 0745 08/12/23 0800 08/12/23 0815  BP: (!) 79/29 (!) 80/24 (!) 92/26 (!) 118/39  Pulse: 71 75  81  Resp: 20 20 20  (!) 22  Temp: 99 F (37.2 C) 98.6 F (37 C) 98.4 F (36.9 C) 98.2 F (36.8 C)  TempSrc:      SpO2: 98% 97% 93% (!) 88%  Weight:      Height:        Intake/Output Summary (Last 24 hours) at 08/12/2023 0847 Last data filed at 08/12/2023 0600 Gross per 24 hour  Intake 2900 ml  Output 300 ml  Net 2600 ml   Filed Weights   08/11/23 1518 08/12/23 0110  Weight: 77.1 kg 74.5 kg    Examination:  Physical Exam Vitals and nursing note reviewed.  Constitutional:      Comments: Ill appearing Pale Poor facial color  HENT:     Head: Normocephalic.     Mouth/Throat:     Comments:  Bleeding from oral mucosa Cardiovascular:     Rate and Rhythm: Normal rate and regular rhythm.  Pulmonary:     Effort: Pulmonary effort is normal.     Comments: Mild respiratory distress Coarse BS bilaterally Abdominal:     General: Bowel sounds are normal. There is no distension.     Comments: portuberant  Skin:    General: Skin is warm and dry.     Capillary Refill: Capillary refill takes 2 to 3 seconds.  Neurological:     Comments: Unresponsive to voice. Minimal response when CVL placed.     Data Reviewed: I have personally reviewed following labs and imaging studies  CBC: Recent Labs  Lab 08/11/23 1504 08/12/23 0258  WBC 3.3* 3.4*  NEUTROABS 2.7  --   HGB 6.3* 8.3*  HCT 19.6* 24.4*  MCV 126.5* 111.4*  PLT 116* 108*   Basic Metabolic Panel: Recent Labs  Lab 08/11/23 1504 08/12/23 0258  NA 151* 152*  K 4.0 3.9  CL 122* 121*  CO2 16* 16*  GLUCOSE 123* 120*  BUN 45* 46*  CREATININE 1.75* 1.67*  CALCIUM 7.8* 7.7*   GFR: Estimated Creatinine Clearance: 26.9 mL/min (A) (by C-G  formula based on SCr of 1.67 mg/dL (H)). Liver Function Tests: Recent Labs  Lab 08/11/23 1504  AST 279*  ALT 107*  ALKPHOS 103  BILITOT 2.0*  PROT 4.8*  ALBUMIN 2.0*    Recent Labs  Lab 08/12/23 0258  AMMONIA 54*   Coagulation Profile: Recent Labs  Lab 08/11/23 1725 08/12/23 0528  INR 2.5* 2.8*   BNP (last 3 results) Recent Labs    04/21/23 0435 04/27/23 0457 06/09/23 1322  BNP 539.0* 1,008.0* 368.0*   Sepsis Labs: Recent Labs  Lab 08/11/23 1554 08/11/23 1725 08/12/23 0650  LATICACIDVEN 3.3* 4.5* 1.7    Recent Results (from the past 240 hours)  Resp panel by RT-PCR (RSV, Flu A&B, Covid) Anterior Nasal Swab     Status: None   Collection Time: 08/11/23  3:18 PM   Specimen: Anterior Nasal Swab  Result Value Ref Range Status   SARS Coronavirus 2 by RT PCR NEGATIVE NEGATIVE Final    Comment: (NOTE) SARS-CoV-2 target nucleic acids are NOT DETECTED.  The SARS-CoV-2 RNA is generally detectable in upper respiratory specimens during the acute phase of infection. The lowest concentration of SARS-CoV-2 viral copies this assay can detect is 138 copies/mL. A negative result does not preclude SARS-Cov-2 infection and should not be used as the sole basis for treatment or other patient management decisions. A negative result may occur with  improper specimen collection/handling, submission of specimen other than nasopharyngeal swab, presence of viral mutation(s) within the areas targeted by this assay, and inadequate number of viral copies(<138 copies/mL). A negative result must be combined with clinical observations, patient history, and epidemiological information. The expected result is Negative.  Fact Sheet for Patients:  BloggerCourse.com  Fact Sheet for Healthcare Providers:  SeriousBroker.it  This test is no t yet approved or cleared by the [place] FDA and  has been authorized for detection and/or  diagnosis of SARS-CoV-2 by FDA under an Emergency Use Authorization (EUA). This EUA will remain  in effect (meaning this test can be used) for the duration of the COVID-19 declaration under Section 564(b)(1) of the Act, 21 U.S.C.section 360bbb-3(b)(1), unless the authorization is terminated  or revoked sooner.       Influenza A by PCR NEGATIVE NEGATIVE Final   Influenza B by PCR NEGATIVE NEGATIVE Final  Comment: (NOTE) The Xpert Xpress SARS-CoV-2/FLU/RSV plus assay is intended as an aid in the diagnosis of influenza from Nasopharyngeal swab specimens and should not be used as a sole basis for treatment. Nasal washings and aspirates are unacceptable for Xpert Xpress SARS-CoV-2/FLU/RSV testing.  Fact Sheet for Patients: BloggerCourse.com  Fact Sheet for Healthcare Providers: SeriousBroker.it  This test is not yet approved or cleared by the [place] FDA and has been authorized for detection and/or diagnosis of SARS-CoV-2 by FDA under an Emergency Use Authorization (EUA). This EUA will remain in effect (meaning this test can be used) for the duration of the COVID-19 declaration under Section 564(b)(1) of the Act, 21 U.S.C. section 360bbb-3(b)(1), unless the authorization is terminated or revoked.     Resp Syncytial Virus by PCR NEGATIVE NEGATIVE Final    Comment: (NOTE) Fact Sheet for Patients: BloggerCourse.com  Fact Sheet for Healthcare Providers: SeriousBroker.it  This test is not yet approved or cleared by the [place] FDA and has been authorized for detection and/or diagnosis of SARS-CoV-2 by FDA under an Emergency Use Authorization (EUA). This EUA will remain in effect (meaning this test can be used) for the duration of the COVID-19 declaration under Section 564(b)(1) of the Act, 21 U.S.C. section 360bbb-3(b)(1), unless the authorization is terminated  or revoked.  Performed at [facility], [address], [place], [place] [address]   Blood Culture (routine x 2)     Status: None (Preliminary result)   Collection Time: 08/11/23  3:46 PM   Specimen: BLOOD  Result Value Ref Range Status   Specimen Description BLOOD CENTRAL LINE  Final   Special Requests   Final    BOTTLES DRAWN AEROBIC AND ANAEROBIC Blood Culture results may not be optimal due to an inadequate volume of blood received in culture bottles   Culture   Final    NO GROWTH < 24 HOURS Performed at [facility], [address], [place], [place] [address]    Report Status PENDING  Incomplete  Blood Culture (routine x 2)     Status: None (Preliminary result)   Collection Time: 08/11/23  3:48 PM   Specimen: BLOOD  Result Value Ref Range Status   Specimen Description BLOOD BLOOD RIGHT WRIST  Final   Special Requests   Final    BOTTLES DRAWN AEROBIC ONLY Blood Culture results may not be optimal due to an inadequate volume of blood received in culture bottles   Culture   Final    NO GROWTH < 24 HOURS Performed at [facility], [address] [place], [place] [address]    Report Status PENDING  Incomplete     Radiology Studies: CT HEAD WO CONTRAST ( ) Result Date: 08/12/2023 CLINICAL DATA:  Altered mental status EXAM: CT HEAD WITHOUT CONTRAST TECHNIQUE: Contiguous axial images were obtained from the base of the skull through the vertex without intravenous contrast. RADIATION DOSE REDUCTION: This exam was performed according to the departmental dose-optimization program which includes automated exposure control, adjustment of the mA and/or kV according to patient size and/or use of iterative reconstruction technique. COMPARISON:  None Available. FINDINGS: Brain: There is no mass, hemorrhage or extra-axial collection. There is generalized atrophy without lobar predilection. Hypodensity of the white matter is most commonly associated with chronic microvascular disease. Vascular: No  hyperdense vessel or unexpected vascular calcification. Skull: The visualized skull base, calvarium and extracranial soft tissues are normal. Sinuses/Orbits: No fluid levels or advanced mucosal thickening of the visualized paranasal sinuses. No mastoid or middle ear  effusion. Right globe prosthesis. Left phthisis bulbi. Other: None. IMPRESSION: 1. No acute intracranial abnormality. 2. Generalized atrophy and findings of chronic microvascular disease. Electronically Signed   By: Deatra Robinson M.D.   On: 08/12/2023 03:27   CT ABDOMEN PELVIS WO CONTRAST Result Date: 08/11/2023 CLINICAL DATA:  Acute abdominal pain. EXAM: CT ABDOMEN AND PELVIS WITHOUT CONTRAST TECHNIQUE: Multidetector CT imaging of the abdomen and pelvis was performed following the standard protocol without IV contrast. RADIATION DOSE REDUCTION: This exam was performed according to the departmental dose-optimization program which includes automated exposure control, adjustment of the mA and/or kV according to patient size and/or use of iterative reconstruction technique. COMPARISON:  CT abdomen and pelvis 10/06/2011. FINDINGS: Lower chest: There are mild patchy airspace opacities in both lung bases. Thin-walled cysts noted in the right lower lobe, new from prior measuring 2.6 cm. Hepatobiliary: No focal liver abnormality is seen. Status post cholecystectomy. No biliary dilatation. Pancreas: Unremarkable. No pancreatic ductal dilatation or surrounding inflammatory changes. Spleen: Normal in size without focal abnormality. Adrenals/Urinary Tract: The bladder is decompressed by Foley catheter. Bilateral kidneys and adrenal glands are within normal limits. Stomach/Bowel: There is marked wall thickening/edema of the ascending colon and hepatic flexure as well as proximal transverse colon. There is no pneumatosis, bowel obstruction or free air. The appendix is not visualized. Small bowel loops are nondilated. The stomach is completely decompressed and  gastric wall thickening can not be excluded. There is sigmoid and descending colon diverticulosis. Vascular/Lymphatic: Aortic atherosclerosis. No enlarged abdominal or pelvic lymph nodes. Reproductive: Status post hysterectomy. No adnexal masses. Other: There is a moderate amount of free fluid throughout the abdomen and pelvis which appears low-density. There is no abdominal wall hernia. Musculoskeletal: Degenerative changes affect the spine. IMPRESSION: 1. Marked wall thickening/edema of the ascending colon, hepatic flexure and proximal transverse colon compatible with colitis. No bowel obstruction or free air. 2. Moderate amount of free fluid throughout the abdomen and pelvis. 3. Mild patchy airspace opacities in both lung bases, worrisome for pneumonia. 4. New thin-walled cyst in the right lower lobe measuring 2.6 cm. 5. Aortic atherosclerosis. Aortic Atherosclerosis (ICD10-I70.0). Electronically Signed   By: Darliss Cheney M.D.   On: 08/11/2023 22:45   DG Chest Port 1 View Result Date: 08/11/2023 CLINICAL DATA:  Questionable sepsis - evaluate for abnormality EXAM: PORTABLE CHEST 1 VIEW COMPARISON:  07/12/2023 FINDINGS: Lung volumes are low. Stable heart size and mediastinal contours. Coronary stent visualized. No focal airspace disease, large pleural effusion or pneumothorax. Left axillary surgical clips. IMPRESSION: Low lung volumes without acute chest findings. Electronically Signed   By: Narda Rutherford M.D.   On: 08/11/2023 19:09    Scheduled Meds:  amiodarone  200 mg Oral Daily   Chlorhexidine Gluconate Cloth  6 each Topical Q0600   hydrocortisone sod succinate (SOLU-CORTEF) inj  150 mg Intravenous STAT   Continuous Infusions:  sodium chloride     sodium chloride     azithromycin 500 mg (08/12/23 0545)   cefTRIAXone (ROCEPHIN)  IV     lactated ringers 150 mL/hr at 08/12/23 1610   lactated ringers 150 mL/hr (08/12/23 0541)   norepinephrine (LEVOPHED) Adult infusion 2 mcg/min (08/12/23 0731)    sodium bicarbonate 150 mEq in dextrose 5 % 1,150 mL infusion 100 mL/hr at 08/12/23 0554     LOS: 1 day   Time spent: 45 minutes in critical care excluding procedural time.  Carollee Herter, DO  [facility]  08/12/2023, 8:47 AM

## 2023-08-12 NOTE — Assessment & Plan Note (Addendum)
 08-11-2023 continue with IV rocephin. Awaiting urine cx  08-13-2023 Foley catheter placed yesterday.  Less bloody urine today.

## 2023-08-12 NOTE — ED Notes (Signed)
Report Given to ICU Nurse.

## 2023-08-12 NOTE — Assessment & Plan Note (Addendum)
 On admission. This secondary to UTI with acute hemorrhagic cystitis as well as bibasilar pneumonia and acute colitis. - Sepsis manifested by tachypnea and leukopenia.  Lactic acid was more than 4.  She was initially hypotensive with a BP of 82/58.  She responded to IV fluid boluses. - Patient will be admitted to a stepdown unit bed. - We will continue aggressive hydration with IV lactated ringer. - Will continue antibiotic therapy with IV Rocephin, Zithromax and Flagyl. - We will follow blood and urine cultures.  08-12-2023 called emergent to bedside to place emergency CVL. Left internal jugular CVL placed. Start IV levophed. Stop Eliquis. Continue with IV ABX pending blood and urine cx. Order arterial line.  08-13-2023 currently on 4 mcg/kg/min of Levophed.  At this point I do not think that she needs a arterial catheter.  Continue her on LR as her acute metabolic acidosis has resolved.  Stop bicarbonate infusion.  Her urine cultures are growing gram-negative rods.  Continue with her IV Rocephin.

## 2023-08-12 NOTE — Progress Notes (Signed)
   Pt on 8 mcg/kg/min of IV levophed via CVL. HgB 7.8 g/dl. Will give another 1 unit PRBC. If remains on IV levophed > 5 mcg/kg/min tomorrow, will consider brachial artery line since RT could not place radial artery line.   Cortisol level 33.2. she does not need anymore IV hydrocortisone.  Carollee Herter, DO [facility]

## 2023-08-12 NOTE — Assessment & Plan Note (Addendum)
 08-12-2023 dx dates as far back as 2018.  Has low AM cortisol of 2.7.  am cortisol level drawn this AM. Will give 150 mg IV hydrocortisone now. Given pt's septic shock. Even normal cortisol level would signal relative adrenal insufficiency.,  08-13-2023 cortisol level yesterday prior to IV hydrocortisone was 33.2.  Patient does not have any adrenal insufficiency.  She was given a dose of IV hydrocortisone yesterday 150 mg.  She does not need any further hydrocortisone.

## 2023-08-12 NOTE — Procedures (Signed)
 08/12/2023 8:26 AM  Procedure:  3 Lumen Central Venous Catheter Line  Operator: Carollee Herter, DO  Indications for procedure: Principal Problem:   Septic shock (HCC) Active Problems:   Dyslipidemia   Pancytopenia (HCC)   Acute blood loss anemia (ABLA)   Essential hypertension   Pressure injury of skin   AKI (acute kidney injury) (HCC)   Type 2 diabetes mellitus without complications (HCC)   Paroxysmal atrial fibrillation (HCC)   Hemorrhagic cystitis   Description of the procedure: Emergency procedure. Next of kin unable to be reached.  the patient was placed in the supine position.  The preprocedural pause was performed using 2 patient identifiers by the primary nurse and myself.  The Left internal jugular vein was identified as the vein of choice.  The area just superficial to the internal jugular vein was sterilely draped and prepped in the usual sterile fashion using 2% chlorhexidine.  Using ultrasound guidance, the area superficial to the internal jugular vein was anesthetized with 3 cc of 1% lidocaine without epinephrine.  Subsequently,  the Left internal jugular vein was cannulated on the second attempt with a 18-gauge needle using direct ultrasound guidance.  Dark red nonpulsatile venous blood was aspirated.  A guidewire was inserted the needle and needle was removed in its entirety.  Ultrasound was used to verify the guidewire was within the lumen of the internal jugular vein.  The skin was nicked with a #11 scalpel blade.  Tissue dilator was used to dilate the tissue over the guidewire.  Tissue dilator was removed in its entirety.  Subsequently, using a previously flushed central venous catheter, this was placed over the guidewire using a modified Seldinger technique.  The catheter was advanced to 20 centimeters into the skin.  The guidewire was removed entirety.  All ports of the central line were hand aspirated for dark red nonpulsatile venous blood and subsequently flushed with 10 cc of  normal saline.  Sterile Biopatch was placed at the insertion site.  The catheter was sutured to the skin.  Sterile Tegaderm dressing was placed over the insertion site.  Patient tolerated procedure well without complication or difficulty.  Estimated blood loss was 3 ml.  Stat portable chest x-ray you will used to determine maternal to placement into rule out pneumothorax prior to use.  Carollee Herter, DO [facility] 08/12/23 8:26 AM

## 2023-08-12 NOTE — Assessment & Plan Note (Addendum)
 08-12-2023 unclear the cause. May be due to septic shock vs lactic acidosis or other etiology. On IV bicarb gtts for now.  08-13-2023 acute acidosis likely due to her sepsis.  Bicarbonate is up to 22 this morning after bicarbonate drip.  Stop bicarb drip.  Continue with IV LR.

## 2023-08-12 NOTE — Assessment & Plan Note (Addendum)
 On admission. We will continue amiodarone and hold off Eliquis.  08-12-2023 DC eliquis. Continue amio. May need to change to IV if pt unable to take po.  08-13-2023 remains in normal sinus rhythm.  Amiodarone on hold.  Eliquis on hold due to hemorrhagic cystitis.  Correct her hypokalemia with IV potassium.

## 2023-08-12 NOTE — Assessment & Plan Note (Addendum)
 08-12-2023 after septic shock has resolved, will give more free water.  08-13-2023 hypernatremia improved after switching to LR and bicarbonate drip.  Serum sodium down to 144.  Stop bicarbonate drip.  Continue with LR.

## 2023-08-12 NOTE — Assessment & Plan Note (Addendum)
 On admission. will continue antihypertensive therapy while holding off nephrotoxins.Marland Kitchen  08-12-2023 given pt's septic shock, will withhold all unnecessary medications until her clinical condition stabilizes.  08-13-2023 continue to hold all hypertension medications.  Patient still on IV Levophed.

## 2023-08-12 NOTE — TOC Initial Note (Signed)
 Transition of Care [facility]) - Initial/Assessment Note    Patient Details  Name: [name] MRN: [id number] Date of Birth: 24-Dec-1945  Transition of Care [facility] CM/SW Contact:    Beather Arbour Phone Number: 08/12/2023, 12:53 PM  Clinical Narrative:              This pt is known to [facility] and was in the hospital last month.      Patient is risk for readmission and was admitted for Septic shock . CSW made numerous attempts calling sister and unable to reach anyone. CSW did leave a confidential VM. This Clinical research associate then called [facility] and spoke who Debbie. Debbie shared that has been there for a month . Debbie confirmed that pt can return back once medically ready. Patient is max assist with all ADL's. TOC will continue to follow.  Expected Discharge Plan: Skilled Nursing Facility Barriers to Discharge: Continued Medical Work up   Patient Goals and CMS Choice Patient states their goals for this hospitalization and ongoing recovery are:: return back to SNF          Expected Discharge Plan and Services In-house Referral: Clinical Social Work   Post Acute Care Choice: Skilled Nursing Facility, Durable Medical Equipment Living arrangements for the past 2 months: Skilled Nursing Facility                                      Prior Living Arrangements/Services Living arrangements for the past 2 months: Skilled Nursing Facility Lives with:: Facility Resident Patient language and need for interpreter reviewed:: Yes Do you feel safe going back to the place where you live?: Yes      Need for Family Participation in Patient Care: Yes (Comment) Care giver support system in place?: Yes (comment)   Criminal Activity/Legal Involvement Pertinent to Current Situation/Hospitalization: No - Comment as needed  Activities of Daily Living      Permission Sought/Granted      Share Information with NAME: Eunice Blase     Permission granted to share info w Relationship: [employer]  Admission     Emotional Assessment Appearance:: Appears stated age   Affect (typically observed): Appropriate Orientation: : Oriented to Self (Reponds to voice) Alcohol / Substance Use: Not Applicable Psych Involvement: No (comment)  Admission diagnosis:  Septic shock (HCC) [A41.9, R65.21] Anemia, unspecified type [D64.9] Pneumonia due to infectious organism, unspecified laterality, unspecified part of lung [J18.9] Patient Active Problem List   Diagnosis Date Noted   Acute blood loss anemia (ABLA) 08/12/2023   Pressure injury of skin 08/12/2023   AKI (acute kidney injury) (HCC) 08/12/2023   Type 2 diabetes mellitus without complications (HCC) 08/12/2023   Dyslipidemia 08/12/2023   Paroxysmal atrial fibrillation (HCC) 08/12/2023   Hemorrhagic cystitis 08/12/2023   Pancytopenia (HCC) 08/12/2023   Acute colitis 08/12/2023   Hypernatremia 08/12/2023   Acute metabolic acidosis 08/12/2023   Septic shock (HCC) 08/11/2023   Macrocytic anemia 07/13/2023   Iron deficiency anemia 07/13/2023   Chronic heart failure with mildly reduced ejection fraction (HFmrEF, 41-49%) (HCC) 07/13/2023   Depression 07/13/2023   Bimalleolar fracture of right ankle 06/29/2021   Type 2 diabetes mellitus with hyperglycemia (HCC) 06/29/2021   Insomnia 06/29/2021   Diabetic neuropathy (HCC) 06/29/2021   Autoimmune hepatitis (HCC) 01/20/2021   NASH (nonalcoholic steatohepatitis) 12/11/2020   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) 06/21/2017   Adrenal insufficiency (HCC) 12/18/2016  COPD exacerbation (HCC) 04/24/2016   DM type 2 (diabetes mellitus, type 2) (HCC) 04/24/2016   Aortic atherosclerosis (HCC) 04/24/2016   IBS (irritable bowel syndrome) 08/07/2012   Coronary artery disease    Gastroesophageal reflux disease    Obesity, Class I, BMI 30-34.9 04/01/2010   BLINDNESS 04/01/2010   Acquired hypothyroidism 09/29/2009   Mixed hyperlipidemia 09/29/2009   Essential hypertension 09/29/2009    PCP:  Galvin Proffer, MD Pharmacy:   [facility] - [place], [place] - [phone] [address] [place] [address] Phone: [phone] Fax: [phone]     Social Drivers of Health (SDOH) Social History: SDOH Screenings   Food Insecurity: No Food Insecurity (07/13/2023)  Housing: Low Risk  (07/13/2023)  Transportation Needs: No Transportation Needs (07/13/2023)  Utilities: Not At Risk (07/13/2023)  Social Connections: Moderately Isolated (07/13/2023)  Tobacco Use: Medium Risk (08/11/2023)  Health Literacy: High Risk (09/08/2020)   Received from [facility], [facility]   SDOH Interventions:     Readmission Risk Interventions    08/12/2023   12:51 PM 07/13/2023   11:45 AM 06/10/2023   12:55 PM  Readmission Risk Prevention Plan  Transportation Screening Complete Complete Complete  Medication Review Oceanographer) Complete Complete Complete  HRI or Home Care Consult Complete Complete Complete  SW Recovery Care/Counseling Consult Complete Complete Complete  Palliative Care Screening Not Applicable Not Applicable Not Applicable  Skilled Nursing Facility Complete Complete Not Applicable

## 2023-08-12 NOTE — Assessment & Plan Note (Signed)
 Estimated body mass index is 31.03 kg/m as calculated from the following:   Height as of this encounter: 5\' 1"  (1.549 m).   Weight as of this encounter: 74.5 kg.

## 2023-08-13 ENCOUNTER — Encounter (HOSPITAL_COMMUNITY): Payer: Self-pay | Admitting: Family Medicine

## 2023-08-13 DIAGNOSIS — K529 Noninfective gastroenteritis and colitis, unspecified: Secondary | ICD-10-CM | POA: Diagnosis not present

## 2023-08-13 DIAGNOSIS — E2740 Unspecified adrenocortical insufficiency: Secondary | ICD-10-CM | POA: Diagnosis not present

## 2023-08-13 DIAGNOSIS — J9601 Acute respiratory failure with hypoxia: Secondary | ICD-10-CM

## 2023-08-13 DIAGNOSIS — K7200 Acute and subacute hepatic failure without coma: Secondary | ICD-10-CM

## 2023-08-13 DIAGNOSIS — A419 Sepsis, unspecified organism: Secondary | ICD-10-CM | POA: Diagnosis not present

## 2023-08-13 DIAGNOSIS — N390 Urinary tract infection, site not specified: Secondary | ICD-10-CM | POA: Insufficient documentation

## 2023-08-13 DIAGNOSIS — N3001 Acute cystitis with hematuria: Secondary | ICD-10-CM

## 2023-08-13 DIAGNOSIS — E8721 Acute metabolic acidosis: Secondary | ICD-10-CM | POA: Diagnosis not present

## 2023-08-13 LAB — TYPE AND SCREEN
ABO/RH(D): B POS
Antibody Screen: NEGATIVE
Unit division: 0
Unit division: 0

## 2023-08-13 LAB — BPAM RBC
Blood Product Expiration Date: 202504062359
Blood Product Expiration Date: 202504112359
ISSUE DATE / TIME: 202503131735
ISSUE DATE / TIME: 202503141445
Unit Type and Rh: 1700
Unit Type and Rh: 5100

## 2023-08-13 LAB — COMPREHENSIVE METABOLIC PANEL
ALT: 106 U/L — ABNORMAL HIGH (ref 0–44)
AST: 266 U/L — ABNORMAL HIGH (ref 15–41)
Albumin: 1.8 g/dL — ABNORMAL LOW (ref 3.5–5.0)
Alkaline Phosphatase: 89 U/L (ref 38–126)
Anion gap: 10 (ref 5–15)
BUN: 44 mg/dL — ABNORMAL HIGH (ref 8–23)
CO2: 22 mmol/L (ref 22–32)
Calcium: 6.9 mg/dL — ABNORMAL LOW (ref 8.9–10.3)
Chloride: 112 mmol/L — ABNORMAL HIGH (ref 98–111)
Creatinine, Ser: 1.57 mg/dL — ABNORMAL HIGH (ref 0.44–1.00)
GFR, Estimated: 34 mL/min — ABNORMAL LOW (ref >60)
Glucose, Bld: 347 mg/dL — ABNORMAL HIGH (ref 70–99)
Potassium: 3.4 mmol/L — ABNORMAL LOW (ref 3.5–5.1)
Sodium: 144 mmol/L (ref 135–145)
Total Bilirubin: 1.8 mg/dL — ABNORMAL HIGH (ref 0.0–1.2)
Total Protein: 4.4 g/dL — ABNORMAL LOW (ref 6.5–8.1)

## 2023-08-13 LAB — CBC WITH DIFFERENTIAL/PLATELET
Abs Immature Granulocytes: 0 10*3/uL (ref 0.00–0.07)
Band Neutrophils: 2 %
Basophils Absolute: 0 10*3/uL (ref 0.0–0.1)
Basophils Relative: 0 %
Eosinophils Absolute: 0 10*3/uL (ref 0.0–0.5)
Eosinophils Relative: 0 %
HCT: 27.2 % — ABNORMAL LOW (ref 36.0–46.0)
Hemoglobin: 9.4 g/dL — ABNORMAL LOW (ref 12.0–15.0)
Lymphocytes Relative: 15 %
Lymphs Abs: 0.6 10*3/uL — ABNORMAL LOW (ref 0.7–4.0)
MCH: 36.2 pg — ABNORMAL HIGH (ref 26.0–34.0)
MCHC: 34.6 g/dL (ref 30.0–36.0)
MCV: 104.6 fL — ABNORMAL HIGH (ref 80.0–100.0)
Monocytes Absolute: 0.2 10*3/uL (ref 0.1–1.0)
Monocytes Relative: 5 %
Neutro Abs: 3.2 10*3/uL (ref 1.7–7.7)
Neutrophils Relative %: 78 %
Platelets: 76 10*3/uL — ABNORMAL LOW (ref 150–400)
RBC: 2.6 MIL/uL — ABNORMAL LOW (ref 3.87–5.11)
RDW: 24.3 % — ABNORMAL HIGH (ref 11.5–15.5)
WBC: 4 10*3/uL (ref 4.0–10.5)
nRBC: 4.6 % — ABNORMAL HIGH (ref 0.0–0.2)
nRBC: 6 /100{WBCs} — ABNORMAL HIGH

## 2023-08-13 LAB — URINE CULTURE: Culture: >=100000 — AB

## 2023-08-13 LAB — GLUCOSE, CAPILLARY: Glucose-Capillary: 217 mg/dL — ABNORMAL HIGH (ref 70–99)

## 2023-08-13 MED ORDER — ORAL CARE MOUTH RINSE
15.0000 mL | OROMUCOSAL | Status: DC
  Administered 2023-08-13 – 2023-08-15 (×10): 15 mL via OROMUCOSAL

## 2023-08-13 MED ORDER — ORAL CARE MOUTH RINSE: 15.0000 mL | OROMUCOSAL | Status: DC | PRN

## 2023-08-13 MED ORDER — POTASSIUM CHLORIDE 10 MEQ/100ML IV SOLN
10.0000 meq | INTRAVENOUS | Status: AC
  Administered 2023-08-13 (×3): 10 meq via INTRAVENOUS
  Filled 2023-08-13 (×3): qty 100

## 2023-08-13 MED ORDER — LACTATED RINGERS IV SOLN: INTRAVENOUS | Status: DC

## 2023-08-13 NOTE — Assessment & Plan Note (Signed)
 08-13-2023 AST of 266, ALT of 106, total bili 1.8.  Stable since admission.  BP has improved on Levophed.  Hopefully her LFTs will trend back to normal now that her perfusion is better.  Doubt this is acute infectious hepatitis

## 2023-08-13 NOTE — Assessment & Plan Note (Signed)
 08-13-2023 urine culture growing gram-negative rods.  Continue with IV Rocephin.

## 2023-08-13 NOTE — Assessment & Plan Note (Signed)
 08-13-2023 replete with IV potassium today.

## 2023-08-13 NOTE — Progress Notes (Signed)
 PROGRESS NOTE    [name]  QIH:[id number] DOB: 1945/10/18 DOA: 08/11/2023 PCP: Galvin Proffer, MD  Subjective: Pt seen and examined. No family at bedside. Pt was made DNR/DNI by pt's two sisters yesterday. Overnight given 1 additional unit PRBC. This AM, HgB up to 9.4 g/dl. Levophed weaned to 4 mcg/kg/min.   Hospital Course: HPI: [name] is a 78 y.o. female with medical history significant for anxiety, COPD, diabetes mellitus, GERD, hypertension, blood in this dyslipidemia and hypothyroidism, who presented to the emergency room with acute onset of altered mental status with hypotension at her SNF.  She was initially diagnosed with UTI over the last few days.  She was started on oral antibiotic therapy.  Her family went to see her today and noted that she was less responsive than normal and had blood in her mouth.  The patient is nonverbal and therefore no history could be obtained.   Significant Events: Admitted 08/11/2023 for septic shock   Significant Labs: Na 151, K 4, CO2 of 16, BUN 45, scr 1.75, glu 123, Cl 122 WBC 3.3, HgB 6.3 Plt 116 ABG pH 7.41, PCO2 of 26, PO2 77 INR 2.8  Significant Imaging Studies: CXR Low lung volumes without acute chest findings.  CT abd  Marked wall thickening/edema of the ascending colon, hepatic flexure and proximal transverse colon compatible with colitis. No bowel obstruction or free air. 2. Moderate amount of free fluid throughout the abdomen and pelvis. 3. Mild patchy airspace opacities in both lung bases, worrisome for pneumonia. 4. New thin-walled cyst in the right lower lobe measuring 2.6 cm. 5. Aortic atherosclerosis. CT head No acute intracranial abnormality. 2. Generalized atrophy and findings of chronic microvascular disease.  Antibiotic Therapy: Anti-infectives (From admission, onward)    Start     Dose/Rate Route Frequency Ordered Stop   08/12/23 1600  cefTRIAXone (ROCEPHIN) 2 g in sodium chloride 0.9 % 100 mL IVPB        2  g 200 mL/hr over 30 Minutes Intravenous Every 24 hours 08/12/23 0423 08/17/23 1559   08/12/23 0500  azithromycin (ZITHROMAX) 500 mg in sodium chloride 0.9 % 250 mL IVPB        500 mg 250 mL/hr over 60 Minutes Intravenous Every 24 hours 08/12/23 0423 08/17/23 0459   08/11/23 2315  vancomycin (VANCOREADY) IVPB 1500 mg/300 mL        1,500 mg 150 mL/hr over 120 Minutes Intravenous  Once 08/11/23 2314 08/12/23 0215   08/11/23 1530  ceFEPIme (MAXIPIME) 2 g in sodium chloride 0.9 % 100 mL IVPB        2 g 200 mL/hr over 30 Minutes Intravenous  Once 08/11/23 1518 08/11/23 1730       Procedures: 08-12-2023 left internal jugular CVL  Consultants:     Assessment and Plan: * Septic shock (HCC) On admission. This secondary to UTI with acute hemorrhagic cystitis as well as bibasilar pneumonia and acute colitis. - Sepsis manifested by tachypnea and leukopenia.  Lactic acid was more than 4.  She was initially hypotensive with a BP of 82/58.  She responded to IV fluid boluses. - Patient will be admitted to a stepdown unit bed. - We will continue aggressive hydration with IV lactated ringer. - Will continue antibiotic therapy with IV Rocephin, Zithromax and Flagyl. - We will follow blood and urine cultures.  08-12-2023 called emergent to bedside to place emergency CVL. Left internal jugular CVL placed. Start IV levophed. Stop Eliquis. Continue with IV ABX  pending blood and urine cx. Order arterial line.  08-13-2023 currently on 4 mcg/kg/min of Levophed.  At this point I do not think that she needs a arterial catheter.  Continue her on LR as her acute metabolic acidosis has resolved.  Stop bicarbonate infusion.  Her urine cultures are growing gram-negative rods.  Continue with her IV Rocephin.  Acute metabolic acidosis 08-12-2023 unclear the cause. May be due to septic shock vs lactic acidosis or other etiology. On IV bicarb gtts for now.  08-13-2023 acute acidosis likely due to her sepsis.   Bicarbonate is up to 22 this morning after bicarbonate drip.  Stop bicarb drip.  Continue with IV LR.  Hypernatremia 08-12-2023 after septic shock has resolved, will give more free water.  08-13-2023 hypernatremia improved after switching to LR and bicarbonate drip.  Serum sodium down to 144.  Stop bicarbonate drip.  Continue with LR.  Acute colitis 08-12-2023 continue with IV rocephin/flagyl  08-13-2023 continue IV Rocephin Flagyl.  Patient still too encephalopathic to tolerate p.o.  Hemorrhagic cystitis 08-11-2023 continue with IV rocephin. Awaiting urine cx  08-13-2023 Foley catheter placed yesterday.  Less bloody urine today.  Adrenal insufficiency (HCC) 08-12-2023 dx dates as far back as 2018.  Has low AM cortisol of 2.7.  am cortisol level drawn this AM. Will give 150 mg IV hydrocortisone now. Given pt's septic shock. Even normal cortisol level would signal relative adrenal insufficiency.,  08-13-2023 cortisol level yesterday prior to IV hydrocortisone was 33.2.  Patient does not have any adrenal insufficiency.  She was given a dose of IV hydrocortisone yesterday 150 mg.  She does not need any further hydrocortisone.    Acute cystitis with hematuria 08-13-2023 urine culture growing gram-negative rods.  Continue with IV Rocephin.  Shock liver 08-13-2023 AST of 266, ALT of 106, total bili 1.8.  Stable since admission.  BP has improved on Levophed.  Hopefully her LFTs will trend back to normal now that her perfusion is better.  Doubt this is acute infectious hepatitis  Pancytopenia (HCC) On admission. She may benefit from hematology consultation.  This can be called later this morning.  08-12-2023 will hold off on heme/onc consult given pt's septic shock. Not sure she is stable for any kind of workup.  08-13-2023 improved.  White count up to 4.  Hemoglobin stable at 9.4 g/dL status post 2 units of packed cells.  Platelets at 76,000.  Continue to hold all heparin products,  Eliquis.  AKI (acute kidney injury) (HCC) On admission. - This is likely prerenal due to volume depletion and dehydration. - We will continue hydration with IV LR will follow BMP.  08-13-2023  Creatinine down to 1.57.  Was 1.75 on admission.  This is due to her volume depletion, dehydration and hypovolemia.  Likely due to ATN from shock.  Continue with IV fluids with LR.  Patient has a Foley catheter now.  Acute blood loss anemia (ABLA) On admission. - The patient was typed and crossmatch and will be transfused 1 unit packed red blood cells. - We will follow posttransfusion H&H. - We will hold off blood thinners.  08-12-2023 may need to give more PRBC transfusion today. If she remains hypotensive.  08-13-2023 was transfused with 2 units of packed cells yesterday.  This morning hemoglobin of 9.4 grams per deciliter.  This is all due to her hemorrhagic cystitis.  Continue to hold Eliquis.  No need for Kcentra at this point.  Monitor hemoglobin.    Acute respiratory failure with  hypoxia (HCC) 08-13-2023 developed hypoxia after receiving large volumes of IV fluids.  At some point we will need to start to diurese her but cannot given her continued use of vasopressors.  Continue with supplemental oxygen.  Patient is now DNR/DNI.  Can use BiPAP if needed.  Paroxysmal atrial fibrillation (HCC) On admission. We will continue amiodarone and hold off Eliquis.  08-12-2023 DC eliquis. Continue amio. May need to change to IV if pt unable to take po.  08-13-2023 remains in normal sinus rhythm.  Amiodarone on hold.  Eliquis on hold due to hemorrhagic cystitis.  Correct her hypokalemia with IV potassium.  Dyslipidemia On admission. We will continue Zetia.  08-12-2023 given pt's septic shock, will withhold all unnecessary medications until her clinical condition stabilizes.  08-13-2023 Continue to hold cholesterol medications due to elevated LFTs.  Type 2 diabetes mellitus without complications  (HCC) On admission. The patient will be placed on supplemental coverage with NovoLog.  08-13-2023 continue sliding scale insulin   Pressure injury of skin Pressure Injury 08/12/23 Coccyx Right Stage 2 -  Partial thickness loss of dermis presenting as a shallow open injury with a red, pink wound bed without slough. open skin, (Active)  08/12/23 0200  Location: Coccyx  Location Orientation: Right  Staging: Stage 2 -  Partial thickness loss of dermis presenting as a shallow open injury with a red, pink wound bed without slough.  Wound Description (Comments): open skin,  Present on Admission: Yes     Pressure Injury 08/12/23 Coccyx Left Stage 2 -  Partial thickness loss of dermis presenting as a shallow open injury with a red, pink wound bed without slough. small skin opening close to the other skin opening (Active)  08/12/23 0410  Location: Coccyx  Location Orientation: Left  Staging: Stage 2 -  Partial thickness loss of dermis presenting as a shallow open injury with a red, pink wound bed without slough.  Wound Description (Comments): small skin opening close to the other skin opening  Present on Admission:       Essential hypertension On admission. will continue antihypertensive therapy while holding off nephrotoxins.Marland Kitchen  08-12-2023 given pt's septic shock, will withhold all unnecessary medications until her clinical condition stabilizes.  08-13-2023 continue to hold all hypertension medications.  Patient still on IV Levophed.  Obesity, Class I, BMI 30-34.9 Estimated body mass index is 31.03 kg/m as calculated from the following:   Height as of this encounter: 5\' 1"  (1.549 m).   Weight as of this encounter: 74.5 kg.   Hypokalemia 08-13-2023 replete with IV potassium today.  Acute metabolic encephalopathy 08-13-2023 this is due to her septic shock, acute cystitis with hematuria.  DVT prophylaxis: Place and maintain sequential compression device Start: 08/13/23 1610    Code  Status: Limited: Do not attempt resuscitation (DNR) -DNR-LIMITED -Do Not Intubate/DNI  Family Communication: no family at bedside. Spoke to both sisters yesterday Disposition Plan: SNF, possibly hospice Reason for continuing need for hospitalization: remains critically ill. On IV levophed, IV ABX, IVF.  Objective: Vitals:   08/13/23 0600 08/13/23 0615 08/13/23 0630 08/13/23 0646  BP: 91/62 119/78 (!) 80/59 99/84  Pulse: 84 84 84 60  Resp: (!) 21 (!) 25 (!) 22 (!) 26  Temp: 99.5 F (37.5 C) 99.7 F (37.6 C) 99.9 F (37.7 C) 100 F (37.8 C)  TempSrc:      SpO2: 93% 92% 90% 91%  Weight:      Height:        Intake/Output Summary (  Last 24 hours) at 08/13/2023 0928 Last data filed at 08/13/2023 0558 Gross per 24 hour  Intake 5724.05 ml  Output --  Net 5724.05 ml   Filed Weights   08/11/23 1518 08/12/23 0110  Weight: 77.1 kg 74.5 kg    Examination:  Physical Exam Vitals and nursing note reviewed.  Constitutional:      Comments: Encephalopathic. Unresponsive to voice. Moaning. Appears ill  HENT:     Head: Normocephalic.  Cardiovascular:     Rate and Rhythm: Normal rate and regular rhythm.  Pulmonary:     Comments: Coarse breath sounds bilaterally.  No wheezing. Abdominal:     General: Abdomen is protuberant. Bowel sounds are normal.     Palpations: Abdomen is soft.  Musculoskeletal:     Comments: Bilateral upper extremity edema.  Mostly dorsum of the hands.  Skin:    General: Skin is warm and dry.     Capillary Refill: Capillary refill takes less than 2 seconds.     Comments: Left IJ central line.  Neurological:     Comments: Encephalopathic.  Unresponsive to voice stimuli.  Moaning out loud.     Data Reviewed: I have personally reviewed following labs and imaging studies  CBC: Recent Labs  Lab 08/11/23 1504 08/12/23 0258 08/12/23 1034 08/12/23 2028 08/13/23 0418  WBC 3.3* 3.4*  --   --  4.0  NEUTROABS 2.7  --   --   --  3.2  HGB 6.3* 8.3* 7.8* 9.8* 9.4*   HCT 19.6* 24.4* 22.8* 27.6* 27.2*  MCV 126.5* 111.4*  --   --  104.6*  PLT 116* 108*  --   --  76*   Basic Metabolic Panel: Recent Labs  Lab 08/11/23 1504 08/12/23 0258 08/13/23 0418  NA 151* 152* 144  K 4.0 3.9 3.4*  CL 122* 121* 112*  CO2 16* 16* 22  GLUCOSE 123* 120* 347*  BUN 45* 46* 44*  CREATININE 1.75* 1.67* 1.57*  CALCIUM 7.8* 7.7* 6.9*   GFR: Estimated Creatinine Clearance: 28.6 mL/min (A) (by C-G formula based on SCr of 1.57 mg/dL (H)). Liver Function Tests: Recent Labs  Lab 08/11/23 1504 08/13/23 0418  AST 279* 266*  ALT 107* 106*  ALKPHOS 103 89  BILITOT 2.0* 1.8*  PROT 4.8* 4.4*  ALBUMIN 2.0* 1.8*    Recent Labs  Lab 08/12/23 0258  AMMONIA 54*   Coagulation Profile: Recent Labs  Lab 08/11/23 1725 08/12/23 0528  INR 2.5* 2.8*   BNP (last 3 results) Recent Labs    04/21/23 0435 04/27/23 0457 06/09/23 1322  BNP 539.0* 1,008.0* 368.0*   Sepsis Labs: Recent Labs  Lab 08/11/23 1554 08/11/23 1725 08/12/23 0650  LATICACIDVEN 3.3* 4.5* 1.7   Cortisol: Lab Results  Component Value Date/Time   LABCORT 33.2 (H) 08/12/2023 05:28 AM    Recent Results (from the past 240 hours)  Resp panel by RT-PCR (RSV, Flu A&B, Covid) Anterior Nasal Swab     Status: None   Collection Time: 08/11/23  3:18 PM   Specimen: Anterior Nasal Swab  Result Value Ref Range Status   SARS Coronavirus 2 by RT PCR NEGATIVE NEGATIVE Final    Comment: (NOTE) SARS-CoV-2 target nucleic acids are NOT DETECTED.  The SARS-CoV-2 RNA is generally detectable in upper respiratory specimens during the acute phase of infection. The lowest concentration of SARS-CoV-2 viral copies this assay can detect is 138 copies/mL. A negative result does not preclude SARS-Cov-2 infection and should not be used as the sole basis  for treatment or other patient management decisions. A negative result may occur with  improper specimen collection/handling, submission of specimen other than  nasopharyngeal swab, presence of viral mutation(s) within the areas targeted by this assay, and inadequate number of viral copies(<138 copies/mL). A negative result must be combined with clinical observations, patient history, and epidemiological information. The expected result is Negative.  Fact Sheet for Patients:  BloggerCourse.com  Fact Sheet for Healthcare Providers:  SeriousBroker.it  This test is no t yet approved or cleared by the [place] FDA and  has been authorized for detection and/or diagnosis of SARS-CoV-2 by FDA under an Emergency Use Authorization (EUA). This EUA will remain  in effect (meaning this test can be used) for the duration of the COVID-19 declaration under Section 564(b)(1) of the Act, 21 U.S.C.section 360bbb-3(b)(1), unless the authorization is terminated  or revoked sooner.       Influenza A by PCR NEGATIVE NEGATIVE Final   Influenza B by PCR NEGATIVE NEGATIVE Final    Comment: (NOTE) The Xpert Xpress SARS-CoV-2/FLU/RSV plus assay is intended as an aid in the diagnosis of influenza from Nasopharyngeal swab specimens and should not be used as a sole basis for treatment. Nasal washings and aspirates are unacceptable for Xpert Xpress SARS-CoV-2/FLU/RSV testing.  Fact Sheet for Patients: BloggerCourse.com  Fact Sheet for Healthcare Providers: SeriousBroker.it  This test is not yet approved or cleared by the [place] FDA and has been authorized for detection and/or diagnosis of SARS-CoV-2 by FDA under an Emergency Use Authorization (EUA). This EUA will remain in effect (meaning this test can be used) for the duration of the COVID-19 declaration under Section 564(b)(1) of the Act, 21 U.S.C. section 360bbb-3(b)(1), unless the authorization is terminated or revoked.     Resp Syncytial Virus by PCR NEGATIVE NEGATIVE Final    Comment:  (NOTE) Fact Sheet for Patients: BloggerCourse.com  Fact Sheet for Healthcare Providers: SeriousBroker.it  This test is not yet approved or cleared by the [place] FDA and has been authorized for detection and/or diagnosis of SARS-CoV-2 by FDA under an Emergency Use Authorization (EUA). This EUA will remain in effect (meaning this test can be used) for the duration of the COVID-19 declaration under Section 564(b)(1) of the Act, 21 U.S.C. section 360bbb-3(b)(1), unless the authorization is terminated or revoked.  Performed at [facility], [address], [place], [place] [address]   Blood Culture (routine x 2)     Status: None (Preliminary result)   Collection Time: 08/11/23  3:46 PM   Specimen: BLOOD  Result Value Ref Range Status   Specimen Description BLOOD CENTRAL LINE  Final   Special Requests   Final    BOTTLES DRAWN AEROBIC AND ANAEROBIC Blood Culture results may not be optimal due to an inadequate volume of blood received in culture bottles   Culture   Final    NO GROWTH 2 DAYS Performed at [facility], [address] [place], [place] [address]    Report Status PENDING  Incomplete  Blood Culture (routine x 2)     Status: None (Preliminary result)   Collection Time: 08/11/23  3:48 PM   Specimen: BLOOD  Result Value Ref Range Status   Specimen Description BLOOD BLOOD RIGHT WRIST  Final   Special Requests   Final    BOTTLES DRAWN AEROBIC ONLY Blood Culture results may not be optimal due to an inadequate volume of blood received in culture bottles   Culture   Final    NO  GROWTH 2 DAYS Performed at [facility], [address], [place], [place] [address]    Report Status PENDING  Incomplete  Urine Culture     Status: Abnormal (Preliminary result)   Collection Time: 08/11/23  4:34 PM   Specimen: Urine, Random  Result Value Ref Range Status   Specimen Description   Final    URINE, RANDOM Performed at [facility], [address], [place], [place] [address]    Special Requests   Final    NONE Reflexed from [id number] Performed at [facility], [address] [place], [place] [address]    Culture (A)  Final    >=100,000 COLONIES/mL GRAM NEGATIVE RODS 90,000 COLONIES/mL GRAM NEGATIVE RODS IDENTIFICATION AND SUSCEPTIBILITIES TO FOLLOW Performed at [facility], [address], [place], [place] [address]    Report Status PENDING  Incomplete  MRSA Next Gen by PCR, Nasal     Status: Abnormal   Collection Time: 08/12/23 12:20 AM   Specimen: Nasal Mucosa; Nasal Swab  Result Value Ref Range Status   MRSA by PCR Next Gen DETECTED (A) NOT DETECTED Final    Comment: RESULT CALLED TO, READ BACK BY AND VERIFIED WITH: ASHLEY SHELTON AT 1357 08/12/23 BY A. SNYDER (NOTE) The GeneXpert MRSA Assay (FDA approved for NASAL specimens only), is one component of a comprehensive MRSA colonization surveillance program. It is not intended to diagnose MRSA infection nor to guide or monitor treatment for MRSA infections. Test performance is not FDA approved in patients less than 62 years old. Performed at [facility], [address], [place], [place] [address]      Radiology Studies: DG CHEST PORT 1 VIEW Result Date: 08/12/2023 CLINICAL DATA:  Line placement EXAM: PORTABLE CHEST 1 VIEW COMPARISON:  X-ray 08/11/2023. FINDINGS: Left IJ catheter in place with tip along the central SVC. No pneumothorax. Underinflation with stable cardiopericardial silhouette. Increasing perihilar opacities on the left para line interstitial changes. No pneumothorax or effusion. Surgical clips in left axillary region. Overlapping cardiac leads. Osteopenia and degenerative changes. IMPRESSION: New left IJ catheter with tip along the central SVC. No pneumothorax. Increasing left midlung opacity.  Recommend follow-up Electronically Signed   By: Karen Kays M.D.   On: 08/12/2023 10:41   CT HEAD WO CONTRAST ( ) Result Date:  08/12/2023 CLINICAL DATA:  Altered mental status EXAM: CT HEAD WITHOUT CONTRAST TECHNIQUE: Contiguous axial images were obtained from the base of the skull through the vertex without intravenous contrast. RADIATION DOSE REDUCTION: This exam was performed according to the departmental dose-optimization program which includes automated exposure control, adjustment of the mA and/or kV according to patient size and/or use of iterative reconstruction technique. COMPARISON:  None Available. FINDINGS: Brain: There is no mass, hemorrhage or extra-axial collection. There is generalized atrophy without lobar predilection. Hypodensity of the white matter is most commonly associated with chronic microvascular disease. Vascular: No hyperdense vessel or unexpected vascular calcification. Skull: The visualized skull base, calvarium and extracranial soft tissues are normal. Sinuses/Orbits: No fluid levels or advanced mucosal thickening of the visualized paranasal sinuses. No mastoid or middle ear effusion. Right globe prosthesis. Left phthisis bulbi. Other: None. IMPRESSION: 1. No acute intracranial abnormality. 2. Generalized atrophy and findings of chronic microvascular disease. Electronically Signed   By: Deatra Robinson M.D.   On: 08/12/2023 03:27   CT ABDOMEN PELVIS WO CONTRAST Result Date: 08/11/2023 CLINICAL DATA:  Acute abdominal pain. EXAM: CT ABDOMEN AND PELVIS WITHOUT CONTRAST TECHNIQUE: Multidetector CT imaging of the abdomen and  pelvis was performed following the standard protocol without IV contrast. RADIATION DOSE REDUCTION: This exam was performed according to the departmental dose-optimization program which includes automated exposure control, adjustment of the mA and/or kV according to patient size and/or use of iterative reconstruction technique. COMPARISON:  CT abdomen and pelvis 10/06/2011. FINDINGS: Lower chest: There are mild patchy airspace opacities in both lung bases. Thin-walled cysts noted in the right  lower lobe, new from prior measuring 2.6 cm. Hepatobiliary: No focal liver abnormality is seen. Status post cholecystectomy. No biliary dilatation. Pancreas: Unremarkable. No pancreatic ductal dilatation or surrounding inflammatory changes. Spleen: Normal in size without focal abnormality. Adrenals/Urinary Tract: The bladder is decompressed by Foley catheter. Bilateral kidneys and adrenal glands are within normal limits. Stomach/Bowel: There is marked wall thickening/edema of the ascending colon and hepatic flexure as well as proximal transverse colon. There is no pneumatosis, bowel obstruction or free air. The appendix is not visualized. Small bowel loops are nondilated. The stomach is completely decompressed and gastric wall thickening can not be excluded. There is sigmoid and descending colon diverticulosis. Vascular/Lymphatic: Aortic atherosclerosis. No enlarged abdominal or pelvic lymph nodes. Reproductive: Status post hysterectomy. No adnexal masses. Other: There is a moderate amount of free fluid throughout the abdomen and pelvis which appears low-density. There is no abdominal wall hernia. Musculoskeletal: Degenerative changes affect the spine. IMPRESSION: 1. Marked wall thickening/edema of the ascending colon, hepatic flexure and proximal transverse colon compatible with colitis. No bowel obstruction or free air. 2. Moderate amount of free fluid throughout the abdomen and pelvis. 3. Mild patchy airspace opacities in both lung bases, worrisome for pneumonia. 4. New thin-walled cyst in the right lower lobe measuring 2.6 cm. 5. Aortic atherosclerosis. Aortic Atherosclerosis (ICD10-I70.0). Electronically Signed   By: Darliss Cheney M.D.   On: 08/11/2023 22:45   DG Chest Port 1 View Result Date: 08/11/2023 CLINICAL DATA:  Questionable sepsis - evaluate for abnormality EXAM: PORTABLE CHEST 1 VIEW COMPARISON:  07/12/2023 FINDINGS: Lung volumes are low. Stable heart size and mediastinal contours. Coronary stent  visualized. No focal airspace disease, large pleural effusion or pneumothorax. Left axillary surgical clips. IMPRESSION: Low lung volumes without acute chest findings. Electronically Signed   By: Narda Rutherford M.D.   On: 08/11/2023 19:09    Scheduled Meds:  sodium chloride   Intravenous Once   Chlorhexidine Gluconate Cloth  6 each Topical Q0600   mupirocin ointment   Nasal BID   mouth rinse  15 mL Mouth Rinse 4 times per day   Continuous Infusions:  azithromycin 250 mL/hr at 08/13/23 0558   cefTRIAXone (ROCEPHIN)  IV Stopped (08/12/23 1556)   lactated ringers 100 mL/hr at 08/13/23 0808   norepinephrine (LEVOPHED) Adult infusion 4 mcg/min (08/13/23 0558)   potassium chloride 10 mEq (08/13/23 0909)     LOS: 2 days   Time spent: 60 minutes in critical care.  Carollee Herter, DO  [facility]  08/13/2023, 9:28 AM

## 2023-08-13 NOTE — Progress Notes (Signed)
   Spoke to pt's two sister Raynelle Fanning and Mindi Junker at bedside. Gave them update.  Carollee Herter, DO [facility]

## 2023-08-13 NOTE — Plan of Care (Signed)
  Problem: Coping: Goal: Level of anxiety will decrease Outcome: Progressing   Problem: Elimination: Goal: Will not experience complications related to urinary retention Outcome: Progressing   Problem: Pain Managment: Goal: General experience of comfort will improve and/or be controlled Outcome: Progressing   Problem: Fluid Volume: Goal: Hemodynamic stability will improve Outcome: Progressing   Problem: Respiratory: Goal: Ability to maintain adequate ventilation will improve Outcome: Progressing   Problem: Education: Goal: Knowledge of General Education information will improve Description: Including pain rating scale, medication(s)/side effects and non-pharmacologic comfort measures Outcome: Not Progressing   Problem: Nutrition: Goal: Adequate nutrition will be maintained Outcome: Not Progressing   Problem: Safety: Goal: Non-violent Restraint(s) Outcome: Not Progressing

## 2023-08-13 NOTE — Assessment & Plan Note (Signed)
 08-13-2023 this is due to her septic shock, acute cystitis with hematuria.

## 2023-08-13 NOTE — Progress Notes (Signed)
   Urine cx growing Klebsiella. Sensitive to rocephin.  Susceptibility data from last 90 days. Collected Specimen Info Organism AMPICILLIN AMPICILLIN/SULBACTAM CEFAZOLIN CEFEPIME CEFTRIAXONE Ciprofloxacin Gentamicin Susc lslt Imipenem Nitrofurantoin Susc lslt Piperacillin + Tazobactam Trimethoprim/Sulfa  08/11/23 Urine, Random Klebsiella pneumoniae  R  S  S  S  S  S  S  S  S  S  S     Ines Warf, DO [facility]

## 2023-08-13 NOTE — Assessment & Plan Note (Signed)
 08-13-2023 developed hypoxia after receiving large volumes of IV fluids.  At some point we will need to start to diurese her but cannot given her continued use of vasopressors.  Continue with supplemental oxygen.  Patient is now DNR/DNI.  Can use BiPAP if needed.

## 2023-08-13 NOTE — Assessment & Plan Note (Signed)
 Pressure Injury 08/12/23 Coccyx Right Stage 2 -  Partial thickness loss of dermis presenting as a shallow open injury with a red, pink wound bed without slough. open skin, (Active)  08/12/23 0200  Location: Coccyx  Location Orientation: Right  Staging: Stage 2 -  Partial thickness loss of dermis presenting as a shallow open injury with a red, pink wound bed without slough.  Wound Description (Comments): open skin,  Present on Admission: Yes     Pressure Injury 08/12/23 Coccyx Left Stage 2 -  Partial thickness loss of dermis presenting as a shallow open injury with a red, pink wound bed without slough. small skin opening close to the other skin opening (Active)  08/12/23 0410  Location: Coccyx  Location Orientation: Left  Staging: Stage 2 -  Partial thickness loss of dermis presenting as a shallow open injury with a red, pink wound bed without slough.  Wound Description (Comments): small skin opening close to the other skin opening  Present on Admission:

## 2023-08-14 ENCOUNTER — Inpatient Hospital Stay (HOSPITAL_COMMUNITY)

## 2023-08-14 DIAGNOSIS — D62 Acute posthemorrhagic anemia: Secondary | ICD-10-CM | POA: Diagnosis not present

## 2023-08-14 DIAGNOSIS — E2740 Unspecified adrenocortical insufficiency: Secondary | ICD-10-CM | POA: Diagnosis not present

## 2023-08-14 DIAGNOSIS — E876 Hypokalemia: Secondary | ICD-10-CM

## 2023-08-14 DIAGNOSIS — A419 Sepsis, unspecified organism: Secondary | ICD-10-CM | POA: Diagnosis not present

## 2023-08-14 LAB — CBC WITH DIFFERENTIAL/PLATELET
Abs Immature Granulocytes: 0 10*3/uL (ref 0.00–0.07)
Basophils Absolute: 0 10*3/uL (ref 0.0–0.1)
Basophils Relative: 0 %
Eosinophils Absolute: 0 10*3/uL (ref 0.0–0.5)
Eosinophils Relative: 0 %
HCT: 30.7 % — ABNORMAL LOW (ref 36.0–46.0)
Hemoglobin: 10.5 g/dL — ABNORMAL LOW (ref 12.0–15.0)
Lymphocytes Relative: 28 %
Lymphs Abs: 1.4 10*3/uL (ref 0.7–4.0)
MCH: 35.8 pg — ABNORMAL HIGH (ref 26.0–34.0)
MCHC: 34.2 g/dL (ref 30.0–36.0)
MCV: 104.8 fL — ABNORMAL HIGH (ref 80.0–100.0)
Monocytes Absolute: 0.1 10*3/uL (ref 0.1–1.0)
Monocytes Relative: 3 %
Neutro Abs: 3.4 10*3/uL (ref 1.7–7.7)
Neutrophils Relative %: 69 %
Platelets: 68 10*3/uL — ABNORMAL LOW (ref 150–400)
RBC: 2.93 MIL/uL — ABNORMAL LOW (ref 3.87–5.11)
RDW: 24.4 % — ABNORMAL HIGH (ref 11.5–15.5)
WBC: 4.9 10*3/uL (ref 4.0–10.5)
nRBC: 5.1 % — ABNORMAL HIGH (ref 0.0–0.2)

## 2023-08-14 LAB — BLOOD GAS, VENOUS
Acid-Base Excess: 3.7 mmol/L — ABNORMAL HIGH (ref 0.0–2.0)
Bicarbonate: 27.7 mmol/L (ref 20.0–28.0)
O2 Saturation: 62.1 %
Patient temperature: 38.3
pCO2, Ven: 41 mmHg — ABNORMAL LOW (ref 44–60)
pH, Ven: 7.44 — ABNORMAL HIGH (ref 7.25–7.43)
pO2, Ven: 37 mmHg (ref 32–45)

## 2023-08-14 LAB — COMPREHENSIVE METABOLIC PANEL
ALT: 109 U/L — ABNORMAL HIGH (ref 0–44)
AST: 247 U/L — ABNORMAL HIGH (ref 15–41)
Albumin: 1.7 g/dL — ABNORMAL LOW (ref 3.5–5.0)
Alkaline Phosphatase: 98 U/L (ref 38–126)
Anion gap: 12 (ref 5–15)
BUN: 37 mg/dL — ABNORMAL HIGH (ref 8–23)
CO2: 24 mmol/L (ref 22–32)
Calcium: 6.7 mg/dL — ABNORMAL LOW (ref 8.9–10.3)
Chloride: 112 mmol/L — ABNORMAL HIGH (ref 98–111)
Creatinine, Ser: 1.44 mg/dL — ABNORMAL HIGH (ref 0.44–1.00)
GFR, Estimated: 38 mL/min — ABNORMAL LOW (ref >60)
Glucose, Bld: 184 mg/dL — ABNORMAL HIGH (ref 70–99)
Potassium: 2.9 mmol/L — ABNORMAL LOW (ref 3.5–5.1)
Sodium: 148 mmol/L — ABNORMAL HIGH (ref 135–145)
Total Bilirubin: 1.9 mg/dL — ABNORMAL HIGH (ref 0.0–1.2)
Total Protein: 4.9 g/dL — ABNORMAL LOW (ref 6.5–8.1)

## 2023-08-14 LAB — GLUCOSE, CAPILLARY
Glucose-Capillary: 172 mg/dL — ABNORMAL HIGH (ref 70–99)
Glucose-Capillary: 175 mg/dL — ABNORMAL HIGH (ref 70–99)
Glucose-Capillary: 180 mg/dL — ABNORMAL HIGH (ref 70–99)
Glucose-Capillary: 183 mg/dL — ABNORMAL HIGH (ref 70–99)
Glucose-Capillary: 210 mg/dL — ABNORMAL HIGH (ref 70–99)
Glucose-Capillary: 217 mg/dL — ABNORMAL HIGH (ref 70–99)

## 2023-08-14 LAB — MAGNESIUM: Magnesium: 1.9 mg/dL (ref 1.7–2.4)

## 2023-08-14 MED ORDER — POTASSIUM CHLORIDE 10 MEQ/100ML IV SOLN
10.0000 meq | INTRAVENOUS | Status: AC
  Administered 2023-08-14 (×4): 10 meq via INTRAVENOUS
  Filled 2023-08-14 (×4): qty 100

## 2023-08-14 MED ORDER — HYDROCORTISONE SOD SUC (PF) 100 MG IJ SOLR
100.0000 mg | Freq: Two times a day (BID) | INTRAMUSCULAR | Status: DC
Start: 2023-08-14 — End: 2023-08-15
  Administered 2023-08-14 – 2023-08-15 (×3): 100 mg via INTRAVENOUS
  Filled 2023-08-14 (×3): qty 2

## 2023-08-14 MED ORDER — MAGNESIUM SULFATE 2 GM/50ML IV SOLN
2.0000 g | Freq: Once | INTRAVENOUS | Status: AC
Start: 2023-08-14 — End: 2023-08-14
  Administered 2023-08-14: 2 g via INTRAVENOUS
  Filled 2023-08-14: qty 50

## 2023-08-14 MED ORDER — POTASSIUM CHLORIDE IN NACL 20-0.45 MEQ/L-% IV SOLN
INTRAVENOUS | Status: DC
  Filled 2023-08-14 (×3): qty 1000

## 2023-08-14 NOTE — Progress Notes (Signed)
 PROGRESS NOTE   [name]  ONG:[id number] DOB: 27-Sep-1945 DOA: 08/11/2023 PCP: Galvin Proffer, MD   Chief Complaint  Patient presents with   Hypotension   Level of care: ICU  Brief Admission History:  78 y.o. female with medical history significant for blindness; depression and anxiety, COPD, diabetes mellitus, autoimmune hepatitis steroid dependent; GERD, hypertension, dyslipidemia and hypothyroidism, who presented to the emergency room with acute onset of altered mental status with hypotension at her SNF.  She was initially diagnosed with UTI over the last few days.  She was started on oral antibiotic therapy.  Her family went to see her today and noted that she was less responsive than normal and had blood in her mouth.  The patient was nonverbal and unresponsive.  She was admitted with septic shock, hemorrhagic cystitis, pneumonia and colitis.    Assessment and Plan:  Septic Shock  - secondary to hemorrhagic cystitis, HCAP, acute colitis  - pt remains critically ill in ICU on pressors - pt is prednisone dependent for many years, added stress dose steroids - wean norepinephrine as able  - central line in place  Klebsiella UTI  - managed by ceftriaxone 2 grams IV daily    Autoimmune Hepatitis - likely has flared since being off longstanding daily prednisone - added stress dose steroids   Acute metabolic acidosis - RESOLVED  - resolved now, briefly treated with IV bicarbonate  Hypernatremia - added more free water to IV fluids - recheck and follow BMP  - increased rate of IV fluid   Acute colitis - continue IV ceftriaxone and metronidazole   Pancytopenia  - leukopenia resolved  - severe thrombocytopenia exacerbated by sepsis, chronic liver disease - holding all heparins  - follow CBC/diff daily   Hemorrhagic cystitis - all anticoagulation discontinued  - continue IV antibiotics as ordered - continue IV fluid as ordered   Paroxysmal atrial fibrillation - HR  remains controlled - home amiodarone currently on hold  - apixaban discontinued   Dyslipidemia - home ezetimibe on hold for now  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 - ICU SSI protocol for glycemic control  - monitor CBG closely CBG (last 3)  Recent Labs    08/13/23 1948 08/14/23 0016 08/14/23 0343  GLUCAP 217* 180* 175*   AKI (acute kidney injury) - prerenal given severe dehydration - bladder scan frequently to monitor for blood clot obstructions - continue to hydrate  - follow daily serum renal function   Acute blood loss anemia (ABLA) - transfused 2 units PRBC on 3/14 - hg 10.5  - follow  - discontinued all anticoagulation  Hypokalemia - additional Mg and IV potassium ordered  - added potassium to IV fluid - follow   Adrenal insufficiency  - IV solucortef added   Acute metabolic encephalopathy - persistent despite maximal medical therapies - pt too unstable for MRI given she remains on IV norepinephrine infusion - repeat CT head without contrast today and compare to study done 2 days ago - check a VBG   Essential hypertension - she is hypotensive now - holding all BP medication   Obesity, Class I, BMI 30-34.9  Goals of care  - pt not improving with maximal medical treatments - will continue discussions with sisters and requesting a palliative medicine consultation for goals of care as it is nearing time to consider transition to full comfort measures  DVT prophylaxis: SCDs Code Status: DNR  Family Communication: t/c to sister Disposition:  remain in ICU  for now   Consultants:  Palliative care   Subjective: Pt remains obtunded/encephalopathic  Objective: Vitals:   08/14/23 0400 08/14/23 0800 08/14/23 0900 08/14/23 1000  BP: (!) 124/55 (!) 116/55 (!) 116/58 (!) 110/45  Pulse: 87 84 83 81  Resp: (!) 29 19 (!) 23 (!) 26  Temp: (!) 100.9 F (38.3 C)  (!) 100.4 F (38 C) 99.9 F (37.7 C)  TempSrc:      SpO2: 94% 95%  95% 95%  Weight:      Height:        Intake/Output Summary (Last 24 hours) at 08/14/2023 1106 Last data filed at 08/14/2023 1000 Gross per 24 hour  Intake 3432.49 ml  Output 300 ml  Net 3132.49 ml   Filed Weights   08/11/23 1518 08/12/23 0110  Weight: 77.1 kg 74.5 kg   Examination:  General exam: acutely and chronically ill appearing with dried blood on tongue, severe dry mucus membranes, gross red blood draining from foley bag; unresponsive, nonverbal;  Respiratory system: tachypnea; rales heard bilaterally with coarse anterior upper airway congestion noises.  Cardiovascular system: normal S1 & S2 heard.  Gastrointestinal system: Abdomen is nondistended, soft and nontender. No organomegaly or masses felt. Normal bowel sounds heard. Central nervous system: somnolent, only responds to noxious stimuli. No focal neurological deficits. Extremities: moves spontaneously; . Skin: stage 2 left/right coccyx pressure wounds. Psychiatry: Judgement and insight appear severely diminished. Mood & affect unable to assess due to condition  Pressure Injury 08/12/23 Coccyx Right Stage 2 -  Partial thickness loss of dermis presenting as a shallow open injury with a red, pink wound bed without slough. open skin, (Active)  08/12/23 0200  Location: Coccyx  Location Orientation: Right  Staging: Stage 2 -  Partial thickness loss of dermis presenting as a shallow open injury with a red, pink wound bed without slough.  Wound Description (Comments): open skin,  Present on Admission: Yes     Pressure Injury 08/12/23 Coccyx Left Stage 2 -  Partial thickness loss of dermis presenting as a shallow open injury with a red, pink wound bed without slough. small skin opening close to the other skin opening (Active)  08/12/23 0410  Location: Coccyx  Location Orientation: Left  Staging: Stage 2 -  Partial thickness loss of dermis presenting as a shallow open injury with a red, pink wound bed without slough.  Wound  Description (Comments): small skin opening close to the other skin opening  Present on Admission:    Data Reviewed: I have personally reviewed following labs and imaging studies  CBC: Recent Labs  Lab 08/11/23 1504 08/12/23 0258 08/12/23 1034 08/12/23 2028 08/13/23 0418 08/14/23 0436  WBC 3.3* 3.4*  --   --  4.0 4.9  NEUTROABS 2.7  --   --   --  3.2 3.4  HGB 6.3* 8.3* 7.8* 9.8* 9.4* 10.5*  HCT 19.6* 24.4* 22.8* 27.6* 27.2* 30.7*  MCV 126.5* 111.4*  --   --  104.6* 104.8*  PLT 116* 108*  --   --  76* 68*    Basic Metabolic Panel: Recent Labs  Lab 08/11/23 1504 08/12/23 0258 08/13/23 0418 08/14/23 0436  NA 151* 152* 144 148*  K 4.0 3.9 3.4* 2.9*  CL 122* 121* 112* 112*  CO2 16* 16* 22 24  GLUCOSE 123* 120* 347* 184*  BUN 45* 46* 44* 37*  CREATININE 1.75* 1.67* 1.57* 1.44*  CALCIUM 7.8* 7.7* 6.9* 6.7*  MG  --   --   --  1.9    CBG: Recent Labs  Lab 08/13/23 1948 08/14/23 0016 08/14/23 0343  GLUCAP 217* 180* 175*    Recent Results (from the past 240 hours)  Resp panel by RT-PCR (RSV, Flu A&B, Covid) Anterior Nasal Swab     Status: None   Collection Time: 08/11/23  3:18 PM   Specimen: Anterior Nasal Swab  Result Value Ref Range Status   SARS Coronavirus 2 by RT PCR NEGATIVE NEGATIVE Final    Comment: (NOTE) SARS-CoV-2 target nucleic acids are NOT DETECTED.  The SARS-CoV-2 RNA is generally detectable in upper respiratory specimens during the acute phase of infection. The lowest concentration of SARS-CoV-2 viral copies this assay can detect is 138 copies/mL. A negative result does not preclude SARS-Cov-2 infection and should not be used as the sole basis for treatment or other patient management decisions. A negative result may occur with  improper specimen collection/handling, submission of specimen other than nasopharyngeal swab, presence of viral mutation(s) within the areas targeted by this assay, and inadequate number of viral copies(<138 copies/mL). A  negative result must be combined with clinical observations, patient history, and epidemiological information. The expected result is Negative.  Fact Sheet for Patients:  BloggerCourse.com  Fact Sheet for Healthcare Providers:  SeriousBroker.it  This test is no t yet approved or cleared by the [place] FDA and  has been authorized for detection and/or diagnosis of SARS-CoV-2 by FDA under an Emergency Use Authorization (EUA). This EUA will remain  in effect (meaning this test can be used) for the duration of the COVID-19 declaration under Section 564(b)(1) of the Act, 21 U.S.C.section 360bbb-3(b)(1), unless the authorization is terminated  or revoked sooner.       Influenza A by PCR NEGATIVE NEGATIVE Final   Influenza B by PCR NEGATIVE NEGATIVE Final    Comment: (NOTE) The Xpert Xpress SARS-CoV-2/FLU/RSV plus assay is intended as an aid in the diagnosis of influenza from Nasopharyngeal swab specimens and should not be used as a sole basis for treatment. Nasal washings and aspirates are unacceptable for Xpert Xpress SARS-CoV-2/FLU/RSV testing.  Fact Sheet for Patients: BloggerCourse.com  Fact Sheet for Healthcare Providers: SeriousBroker.it  This test is not yet approved or cleared by the [place] FDA and has been authorized for detection and/or diagnosis of SARS-CoV-2 by FDA under an Emergency Use Authorization (EUA). This EUA will remain in effect (meaning this test can be used) for the duration of the COVID-19 declaration under Section 564(b)(1) of the Act, 21 U.S.C. section 360bbb-3(b)(1), unless the authorization is terminated or revoked.     Resp Syncytial Virus by PCR NEGATIVE NEGATIVE Final    Comment: (NOTE) Fact Sheet for Patients: BloggerCourse.com  Fact Sheet for Healthcare  Providers: SeriousBroker.it  This test is not yet approved or cleared by the [place] FDA and has been authorized for detection and/or diagnosis of SARS-CoV-2 by FDA under an Emergency Use Authorization (EUA). This EUA will remain in effect (meaning this test can be used) for the duration of the COVID-19 declaration under Section 564(b)(1) of the Act, 21 U.S.C. section 360bbb-3(b)(1), unless the authorization is terminated or revoked.  Performed at [facility], [address], [place], [place] [address]   Blood Culture (routine x 2)     Status: None (Preliminary result)   Collection Time: 08/11/23  3:46 PM   Specimen: BLOOD  Result Value Ref Range Status   Specimen Description BLOOD CENTRAL LINE  Final   Special Requests   Final  BOTTLES DRAWN AEROBIC AND ANAEROBIC Blood Culture results may not be optimal due to an inadequate volume of blood received in culture bottles   Culture   Final    NO GROWTH 3 DAYS Performed at [facility], [address] [place], [place] [address]    Report Status PENDING  Incomplete  Blood Culture (routine x 2)     Status: None (Preliminary result)   Collection Time: 08/11/23  3:48 PM   Specimen: BLOOD  Result Value Ref Range Status   Specimen Description BLOOD BLOOD RIGHT WRIST  Final   Special Requests   Final    BOTTLES DRAWN AEROBIC ONLY Blood Culture results may not be optimal due to an inadequate volume of blood received in culture bottles   Culture   Final    NO GROWTH 3 DAYS Performed at [facility], [address] [place], [place] [address]    Report Status PENDING  Incomplete  Urine Culture     Status: Abnormal   Collection Time: 08/11/23  4:34 PM   Specimen: Urine, Random  Result Value Ref Range Status   Specimen Description   Final    URINE, RANDOM Performed at [facility], [address], [place], [place] [address]    Special Requests   Final    NONE Reflexed from [id number] Performed at [facility], [address], [place], [place] [address]    Culture (A)  Final    >=100,000 COLONIES/mL KLEBSIELLA PNEUMONIAE TWO MORPHOLOGIES IDENTIFIED AS THE SAME ORGANISM. SUSCEPTIBILITIES ARE IDENTICAL. Performed at [facility], [address], [place], [place] [address]    Report Status 08/13/2023 FINAL  Final   Organism ID, Bacteria KLEBSIELLA PNEUMONIAE (A)  Final      Susceptibility   Klebsiella pneumoniae - MIC*    AMPICILLIN RESISTANT Resistant     CEFAZOLIN <=4 SENSITIVE Sensitive     CEFEPIME <=0.12 SENSITIVE Sensitive     CEFTRIAXONE <=0.25 SENSITIVE Sensitive     CIPROFLOXACIN <=0.25 SENSITIVE Sensitive     GENTAMICIN <=1 SENSITIVE Sensitive     IMIPENEM <=0.25 SENSITIVE Sensitive     NITROFURANTOIN 32 SENSITIVE Sensitive     TRIMETH/SULFA <=20 SENSITIVE Sensitive     AMPICILLIN/SULBACTAM <=2 SENSITIVE Sensitive     PIP/TAZO <=4 SENSITIVE Sensitive ug/mL    * >=100,000 COLONIES/mL KLEBSIELLA PNEUMONIAE  MRSA Next Gen by PCR, Nasal     Status: Abnormal   Collection Time: 08/12/23 12:20 AM   Specimen: Nasal Mucosa; Nasal Swab  Result Value Ref Range Status   MRSA by PCR Next Gen DETECTED (A) NOT DETECTED Final    Comment: RESULT CALLED TO, READ BACK BY AND VERIFIED WITH: ASHLEY SHELTON AT 1357 08/12/23 BY A. SNYDER (NOTE) The GeneXpert MRSA Assay (FDA approved for NASAL specimens only), is one component of a comprehensive MRSA colonization surveillance program. It is not intended to diagnose MRSA infection nor to guide or monitor treatment for MRSA infections. Test performance is not FDA approved in patients less than 32 years old. Performed at [facility], [address] [place], [place] [address]      Radiology Studies: CT HEAD WO CONTRAST ( ) Result Date: 08/14/2023 CLINICAL DATA:  Mental status change EXAM: CT HEAD WITHOUT CONTRAST TECHNIQUE: Contiguous axial images were obtained from the base of the skull through the vertex without intravenous contrast.  RADIATION DOSE REDUCTION: This exam was performed according to the departmental dose-optimization program which includes automated exposure control, adjustment of the mA and/or kV according  to patient size and/or use of iterative reconstruction technique. COMPARISON:  Two days ago FINDINGS: Brain: No evidence of acute infarction, hemorrhage, hydrocephalus, extra-axial collection or mass lesion/mass effect. Pronounced brain atrophy with chronic small vessel ischemia. Vascular: No hyperdense vessel or unexpected calcification. 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 IMPRESSION: No acute or interval finding. Advanced brain atrophy. Electronically Signed   By: Tiburcio Pea M.D.   On: 08/14/2023 10:07   Scheduled Meds:  Chlorhexidine Gluconate Cloth  6 each Topical Q0600   hydrocortisone sod succinate (SOLU-CORTEF) inj  100 mg Intravenous Q12H   mupirocin ointment   Nasal BID   mouth rinse  15 mL Mouth Rinse 4 times per day   Continuous Infusions:  0.45 % NaCl with KCl 20 mEq / L 125 mL/hr at 08/14/23 0951   azithromycin 500 mg (08/14/23 0445)   cefTRIAXone (ROCEPHIN)  IV 2 g (08/13/23 1708)   norepinephrine (LEVOPHED) Adult infusion 4 mcg/min (08/13/23 0558)   potassium chloride 10 mEq (08/14/23 1053)    LOS: 3 days   Critical Care Procedure Note Authorized and Performed by: Maryln Manuel MD  Total Critical Care time:  70 mins Due to a high probability of clinically significant, life threatening deterioration, the patient required my highest level of preparedness to intervene emergently and I personally spent this critical care time directly and personally managing the patient.  This critical care time included obtaining a history; examining the patient, pulse oximetry; ordering and review of studies; arranging urgent treatment with development of a management plan; evaluation of  patient's response of treatment; frequent reassessment; and discussions with other providers.  This critical care time was performed to assess and manage the high probability of imminent and life threatening deterioration that could result in multi-organ failure.  It was exclusive of separately billable procedures and treating other patients and teaching time.   Standley Dakins, MD How to contact the [facility] Attending or Consulting provider 7A - 7P or covering provider during after hours 7P -7A, for this patient?  Check the care team in [facility] and look for a) attending/consulting [facility] provider listed and b) the [facility] team listed Log into [email or web address] to find provider on call.  Locate the [facility] provider you are looking for under [facility] and page to a number that you can be directly reached. If you still have difficulty reaching the provider, please page the [facility] (Director on Call) for the Hospitalists listed on amion for assistance.  08/14/2023, 11:06 AM

## 2023-08-14 NOTE — Plan of Care (Signed)
  Problem: Education: Goal: Knowledge of General Education information will improve Description: Including pain rating scale, medication(s)/side effects and non-pharmacologic comfort measures Outcome: Not Progressing   Problem: Health Behavior/Discharge Planning: Goal: Ability to manage health-related needs will improve Outcome: Not Progressing   Problem: Clinical Measurements: Goal: Ability to maintain clinical measurements within normal limits will improve Outcome: Not Progressing Goal: Will remain free from infection Outcome: Not Progressing Goal: Diagnostic test results will improve Outcome: Not Progressing Goal: Respiratory complications will improve Outcome: Not Progressing Goal: Cardiovascular complication will be avoided Outcome: Not Progressing   Problem: Activity: Goal: Risk for activity intolerance will decrease Outcome: Not Progressing   Problem: Nutrition: Goal: Adequate nutrition will be maintained Outcome: Not Progressing   Problem: Coping: Goal: Level of anxiety will decrease Outcome: Not Progressing   Problem: Elimination: Goal: Will not experience complications related to bowel motility Outcome: Not Progressing Goal: Will not experience complications related to urinary retention Outcome: Not Progressing   Problem: Pain Managment: Goal: General experience of comfort will improve and/or be controlled Outcome: Not Progressing   Problem: Safety: Goal: Ability to remain free from injury will improve Outcome: Not Progressing   Problem: Skin Integrity: Goal: Risk for impaired skin integrity will decrease Outcome: Not Progressing   Problem: Fluid Volume: Goal: Hemodynamic stability will improve Outcome: Not Progressing   Problem: Clinical Measurements: Goal: Diagnostic test results will improve Outcome: Not Progressing Goal: Signs and symptoms of infection will decrease Outcome: Not Progressing   Problem: Respiratory: Goal: Ability to maintain  adequate ventilation will improve Outcome: Not Progressing   Problem: Safety: Goal: Non-violent Restraint(s) Outcome: Not Progressing

## 2023-08-14 NOTE — Progress Notes (Signed)
 Taken to CT with transporter Ronaldo Miyamoto helping. CT of head completed and patient brought back to room.

## 2023-08-15 ENCOUNTER — Encounter (HOSPITAL_COMMUNITY): Payer: Self-pay | Admitting: Family Medicine

## 2023-08-15 ENCOUNTER — Inpatient Hospital Stay (HOSPITAL_COMMUNITY)

## 2023-08-15 DIAGNOSIS — B9689 Other specified bacterial agents as the cause of diseases classified elsewhere: Secondary | ICD-10-CM

## 2023-08-15 DIAGNOSIS — N390 Urinary tract infection, site not specified: Secondary | ICD-10-CM

## 2023-08-15 DIAGNOSIS — Z515 Encounter for palliative care: Secondary | ICD-10-CM

## 2023-08-15 DIAGNOSIS — R6521 Severe sepsis with septic shock: Secondary | ICD-10-CM | POA: Diagnosis not present

## 2023-08-15 DIAGNOSIS — E876 Hypokalemia: Secondary | ICD-10-CM | POA: Diagnosis not present

## 2023-08-15 DIAGNOSIS — Z7189 Other specified counseling: Secondary | ICD-10-CM

## 2023-08-15 DIAGNOSIS — A419 Sepsis, unspecified organism: Secondary | ICD-10-CM | POA: Diagnosis not present

## 2023-08-15 DIAGNOSIS — J80 Acute respiratory distress syndrome: Secondary | ICD-10-CM

## 2023-08-15 DIAGNOSIS — E2740 Unspecified adrenocortical insufficiency: Secondary | ICD-10-CM | POA: Diagnosis not present

## 2023-08-15 LAB — CBC WITH DIFFERENTIAL/PLATELET
Abs Immature Granulocytes: 0 10*3/uL (ref 0.00–0.07)
Basophils Absolute: 0 10*3/uL (ref 0.0–0.1)
Basophils Relative: 0 %
Eosinophils Absolute: 0 10*3/uL (ref 0.0–0.5)
Eosinophils Relative: 0 %
HCT: 30.1 % — ABNORMAL LOW (ref 36.0–46.0)
Hemoglobin: 10.3 g/dL — ABNORMAL LOW (ref 12.0–15.0)
Lymphocytes Relative: 19 %
Lymphs Abs: 0.7 10*3/uL (ref 0.7–4.0)
MCH: 36.5 pg — ABNORMAL HIGH (ref 26.0–34.0)
MCHC: 34.2 g/dL (ref 30.0–36.0)
MCV: 106.7 fL — ABNORMAL HIGH (ref 80.0–100.0)
Metamyelocytes Relative: 1 %
Monocytes Absolute: 0.1 10*3/uL (ref 0.1–1.0)
Monocytes Relative: 2 %
Neutro Abs: 2.8 10*3/uL (ref 1.7–7.7)
Neutrophils Relative %: 78 %
Platelets: 50 10*3/uL — ABNORMAL LOW (ref 150–400)
RBC: 2.82 MIL/uL — ABNORMAL LOW (ref 3.87–5.11)
RDW: 24.2 % — ABNORMAL HIGH (ref 11.5–15.5)
WBC: 3.6 10*3/uL — ABNORMAL LOW (ref 4.0–10.5)
nRBC: 1.9 % — ABNORMAL HIGH (ref 0.0–0.2)
nRBC: 4 /100{WBCs} — ABNORMAL HIGH

## 2023-08-15 LAB — COMPREHENSIVE METABOLIC PANEL
ALT: 106 U/L — ABNORMAL HIGH (ref 0–44)
AST: 205 U/L — ABNORMAL HIGH (ref 15–41)
Albumin: 1.8 g/dL — ABNORMAL LOW (ref 3.5–5.0)
Alkaline Phosphatase: 110 U/L (ref 38–126)
Anion gap: 8 (ref 5–15)
BUN: 39 mg/dL — ABNORMAL HIGH (ref 8–23)
CO2: 22 mmol/L (ref 22–32)
Calcium: 6.8 mg/dL — ABNORMAL LOW (ref 8.9–10.3)
Chloride: 112 mmol/L — ABNORMAL HIGH (ref 98–111)
Creatinine, Ser: 1.34 mg/dL — ABNORMAL HIGH (ref 0.44–1.00)
GFR, Estimated: 41 mL/min — ABNORMAL LOW (ref >60)
Glucose, Bld: 234 mg/dL — ABNORMAL HIGH (ref 70–99)
Potassium: 4.4 mmol/L (ref 3.5–5.1)
Sodium: 142 mmol/L (ref 135–145)
Total Bilirubin: 2.2 mg/dL — ABNORMAL HIGH (ref 0.0–1.2)
Total Protein: 5.1 g/dL — ABNORMAL LOW (ref 6.5–8.1)

## 2023-08-15 LAB — GLUCOSE, CAPILLARY
Glucose-Capillary: 214 mg/dL — ABNORMAL HIGH (ref 70–99)
Glucose-Capillary: 199 mg/dL — ABNORMAL HIGH (ref 70–99)

## 2023-08-15 LAB — MAGNESIUM: Magnesium: 2.3 mg/dL (ref 1.7–2.4)

## 2023-08-15 MED ORDER — INSULIN ASPART 100 UNIT/ML IJ SOLN: 0.0000 [IU] | INTRAMUSCULAR | Status: DC

## 2023-08-15 MED ORDER — MORPHINE BOLUS VIA INFUSION
1.0000 mg | INTRAVENOUS | Status: DC | PRN
  Administered 2023-08-15: 2 mg via INTRAVENOUS

## 2023-08-15 MED ORDER — DIPHENHYDRAMINE HCL 50 MG/ML IJ SOLN: 12.5000 mg | INTRAMUSCULAR | Status: DC | PRN

## 2023-08-15 MED ORDER — MORPHINE 100MG IN NS 100ML (1MG/ML) PREMIX INFUSION
1.0000 mg/h | INTRAVENOUS | Status: DC
  Administered 2023-08-15: 3 mg/h via INTRAVENOUS
  Filled 2023-08-15: qty 100

## 2023-08-15 MED ORDER — ACETAMINOPHEN 325 MG PO TABS: 650.0000 mg | ORAL_TABLET | Freq: Four times a day (QID) | ORAL | Status: DC | PRN

## 2023-08-15 MED ORDER — BIOTENE DRY MOUTH MT LIQD: 15.0000 mL | OROMUCOSAL | Status: DC | PRN

## 2023-08-15 MED ORDER — HALOPERIDOL 0.5 MG PO TABS: 0.5000 mg | ORAL_TABLET | ORAL | Status: DC | PRN

## 2023-08-15 MED ORDER — POLYVINYL ALCOHOL 1.4 % OP SOLN: 1.0000 [drp] | Freq: Four times a day (QID) | OPHTHALMIC | Status: DC | PRN

## 2023-08-15 MED ORDER — LORAZEPAM 2 MG/ML IJ SOLN: 1.0000 mg | INTRAMUSCULAR | Status: DC | PRN

## 2023-08-15 MED ORDER — SCOPOLAMINE 1 MG/3DAYS TD PT72
1.0000 | MEDICATED_PATCH | TRANSDERMAL | Status: DC
  Administered 2023-08-15: 1.5 mg via TRANSDERMAL
  Filled 2023-08-15: qty 1

## 2023-08-15 MED ORDER — HALOPERIDOL LACTATE 5 MG/ML IJ SOLN: 0.5000 mg | INTRAMUSCULAR | Status: DC | PRN

## 2023-08-15 MED ORDER — HALOPERIDOL LACTATE 2 MG/ML PO CONC: 0.5000 mg | ORAL | Status: DC | PRN

## 2023-08-15 MED ORDER — HALOPERIDOL LACTATE 5 MG/ML IJ SOLN: 5.0000 mg | Freq: Four times a day (QID) | INTRAMUSCULAR | Status: DC | PRN

## 2023-08-15 MED ORDER — GLYCOPYRROLATE 0.2 MG/ML IJ SOLN
0.2000 mg | INTRAMUSCULAR | Status: DC | PRN
  Administered 2023-08-15 (×2): 0.2 mg via INTRAVENOUS
  Filled 2023-08-15 (×2): qty 1

## 2023-08-15 MED ORDER — GLYCOPYRROLATE 1 MG PO TABS: 1.0000 mg | ORAL_TABLET | ORAL | Status: DC | PRN

## 2023-08-15 MED ORDER — LORAZEPAM 1 MG PO TABS: 1.0000 mg | ORAL_TABLET | ORAL | Status: DC | PRN

## 2023-08-15 MED ORDER — ACETAMINOPHEN 650 MG RE SUPP: 650.0000 mg | Freq: Four times a day (QID) | RECTAL | Status: DC | PRN

## 2023-08-15 MED ORDER — GLYCOPYRROLATE 0.2 MG/ML IJ SOLN: 0.2000 mg | INTRAMUSCULAR | Status: DC | PRN

## 2023-08-15 MED ORDER — LACTATED RINGERS IV SOLN: INTRAVENOUS | Status: DC

## 2023-08-15 MED ORDER — NYSTATIN 100000 UNIT/GM EX POWD: Freq: Three times a day (TID) | CUTANEOUS | Status: DC | PRN

## 2023-08-15 MED ORDER — HALOPERIDOL LACTATE 5 MG/ML IJ SOLN: 1.0000 mg | Freq: Four times a day (QID) | INTRAMUSCULAR | Status: DC | PRN

## 2023-08-16 LAB — CULTURE, BLOOD (ROUTINE X 2)
Culture: NO GROWTH
Culture: NO GROWTH

## 2023-08-30 NOTE — Death Summary Note (Signed)
 DEATH SUMMARY   Patient Details  Name: [name] MRN: [id number] DOB: 1946-04-19  Admission/Discharge Information   Admit Date:  2023-08-20  Date of Death: Date of Death: 2023-08-24  Time of Death: Time of Death: 08-29-2098  Length of Stay: 4  Referring Physician: Galvin Proffer, MD   Reason(s) for Hospitalization  78 y.o. female with medical history significant for blindness; depression and anxiety, COPD, diabetes mellitus, autoimmune hepatitis steroid dependent; GERD, hypertension, dyslipidemia and hypothyroidism, who presented to the emergency room with acute onset of altered mental status with hypotension at her SNF.  She was initially diagnosed with UTI over the last few days.  She was started on oral antibiotic therapy.  Her family went to see her today and noted that she was less responsive than normal and had blood in her mouth.  The patient was nonverbal and unresponsive.  She was admitted with septic shock, hemorrhagic cystitis, pneumonia and colitis.   Diagnoses  Preliminary cause of death: septic shock Secondary Diagnoses (including complications and co-morbidities):  Principal Problem:   Septic shock (HCC) Active Problems:   Obesity, Class I, BMI 30-34.9   Essential hypertension   Acute respiratory failure with hypoxia (HCC)   Acute metabolic encephalopathy   Adrenal insufficiency (HCC)   Hypokalemia   Acute blood loss anemia (ABLA)   Pressure injury of skin   AKI (acute kidney injury) (HCC)   Type 2 diabetes mellitus without complications (HCC)   Dyslipidemia   Paroxysmal atrial fibrillation (HCC)   Hemorrhagic cystitis   Pancytopenia (HCC)   Acute colitis   Hypernatremia   Acute metabolic acidosis   Shock liver   UTI due to Klebsiella species   Brief Hospital Course (including significant findings, care, treatment, and services provided and events leading to death)   Septic Shock  HCAP - appears to be developing ARDS  - shock secondary to hemorrhagic  cystitis, HCAP, acute colitis  - pt critically ill in ICU on pressors - pt is prednisone dependent for many years, added stress dose steroids - wean norepinephrine as able  - central line in place - with no meaningful improvements discussed with sisters at bedside decision made to transition to full comfort measures   Klebsiella UTI  - treated with ceftriaxone 2 grams IV daily     Autoimmune Hepatitis - likely has flared since being off longstanding daily prednisone - added stress dose steroids --  now transitioning to full comfort measures   Acute metabolic acidosis - RESOLVED  - resolved now, briefly treated with IV bicarbonate   Hypernatremia - treated and resolved  - added more free water to IV fluids - recheck and follow BMP  - increased rate of IV fluid  - transitioning to full comfort care on 08/24/23   Acute colitis - treated with IV ceftriaxone and metronidazole    Pancytopenia  - leukopenia resolved  - severe thrombocytopenia exacerbated by sepsis, chronic liver disease - holding all heparins  - transitioned to full comfort measures    Hemorrhagic cystitis - all anticoagulation discontinued  - transitioned to full comfort measures on 2023-08-24    Paroxysmal atrial fibrillation - HR remains controlled - home amiodarone held  - apixaban discontinued due to severe bleeding   Dyslipidemia - home ezetimibe on hold for now   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 - ICU SSI protocol for glycemic control    AKI (acute kidney injury) - prerenal given severe dehydration - bladder scanned frequently to monitor  for blood clot obstructions -  transitioned to full comfort care    Acute blood loss anemia (ABLA) - transfused 2 units PRBC on 3/14 - hg 10.3 - discontinued all anticoagulation   Hypokalemia - additional Mg and IV potassium ordered  - transitioned to full comfort care     Adrenal insufficiency  - IV solucortef added but no improvement  in condition - transition to full comfort measures    Acute metabolic encephalopathy - persistent despite maximal medical therapies - pt too unstable for MRI given she remains on IV norepinephrine infusion - repeat CT head without contrast today and compared to study done 2 days ago with findings of severe cerebral atrophy but no acute CVA   Essential hypertension - remained in septic shock and pressor dependent before transition to full comfort    Obesity, Class I, BMI 30-34.9   Goals of care  - pt not improving with maximal medical treatments - discussion with sisters and decision made to transition to full comfort measures      Pertinent Labs and Studies  Significant Diagnostic Studies DG CHEST PORT 1 VIEW Result Date: 08/16/2023 CLINICAL DATA:  ARDS. EXAM: PORTABLE CHEST 1 VIEW COMPARISON:  Chest radiograph dated 08/14/2023. FINDINGS: Left IJ central venous line in similar position. No significant interval change in bilateral pulmonary opacities. No pleural effusion or pneumothorax. Stable cardiac silhouette no acute osseous pathology. IMPRESSION: No significant interval change in bilateral pulmonary opacities. Electronically Signed   By: Elgie Collard M.D.   On: 08/22/2023 13:22   DG CHEST PORT 1 VIEW Result Date: 08/14/2023 CLINICAL DATA:  [id number] Acute respiratory failure with hypoxia (HCC) [id number] EXAM: PORTABLE CHEST 1 VIEW COMPARISON:  08/12/2023 FINDINGS: Patient is rotated. Left IJ central venous catheter is stable in positioning. Normal heart size. Worsening bilateral airspace opacities, most pronounced in the perihilar regions. Low lung volumes. No large pleural fluid collection. No pneumothorax. IMPRESSION: Worsening bilateral airspace opacities, most pronounced in the perihilar regions. Electronically Signed   By: Duanne Guess D.O.   On: 08/14/2023 11:06   CT HEAD WO CONTRAST ( ) Result Date: 08/14/2023 CLINICAL DATA:  Mental status change EXAM: CT HEAD WITHOUT  CONTRAST TECHNIQUE: Contiguous axial images were obtained from the base of the skull through the vertex without intravenous contrast. RADIATION DOSE REDUCTION: This exam was performed according to the departmental dose-optimization program which includes automated exposure control, adjustment of the mA and/or kV according to patient size and/or use of iterative reconstruction technique. COMPARISON:  Two days ago FINDINGS: Brain: No evidence of acute infarction, hemorrhage, hydrocephalus, extra-axial collection or mass lesion/mass effect. Pronounced brain atrophy with chronic small vessel ischemia. Vascular: No hyperdense vessel or unexpected calcification. 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 IMPRESSION: No acute or interval finding. Advanced brain atrophy. Electronically Signed   By: Tiburcio Pea M.D.   On: 08/14/2023 10:07   DG CHEST PORT 1 VIEW Result Date: 08/12/2023 CLINICAL DATA:  Line placement EXAM: PORTABLE CHEST 1 VIEW COMPARISON:  X-ray 08/11/2023. FINDINGS: Left IJ catheter in place with tip along the central SVC. No pneumothorax. Underinflation with stable cardiopericardial silhouette. Increasing perihilar opacities on the left para line interstitial changes. No pneumothorax or effusion. Surgical clips in left axillary region. Overlapping cardiac leads. Osteopenia and degenerative changes. IMPRESSION: New left IJ catheter with tip along the central SVC. No pneumothorax. Increasing left midlung opacity.  Recommend follow-up Electronically Signed  By: Karen Kays M.D.   On: 08/12/2023 10:41   CT HEAD WO CONTRAST ( ) Result Date: 08/12/2023 CLINICAL DATA:  Altered mental status EXAM: CT HEAD WITHOUT CONTRAST TECHNIQUE: Contiguous axial images were obtained from the base of the skull through the vertex without intravenous contrast. RADIATION DOSE REDUCTION: This exam was  performed according to the departmental dose-optimization program which includes automated exposure control, adjustment of the mA and/or kV according to patient size and/or use of iterative reconstruction technique. COMPARISON:  None Available. FINDINGS: Brain: There is no mass, hemorrhage or extra-axial collection. There is generalized atrophy without lobar predilection. Hypodensity of the white matter is most commonly associated with chronic microvascular disease. Vascular: No hyperdense vessel or unexpected vascular calcification. Skull: The visualized skull base, calvarium and extracranial soft tissues are normal. Sinuses/Orbits: No fluid levels or advanced mucosal thickening of the visualized paranasal sinuses. No mastoid or middle ear effusion. Right globe prosthesis. Left phthisis bulbi. Other: None. IMPRESSION: 1. No acute intracranial abnormality. 2. Generalized atrophy and findings of chronic microvascular disease. Electronically Signed   By: Deatra Robinson M.D.   On: 08/12/2023 03:27   CT ABDOMEN PELVIS WO CONTRAST Result Date: 08/11/2023 CLINICAL DATA:  Acute abdominal pain. EXAM: CT ABDOMEN AND PELVIS WITHOUT CONTRAST TECHNIQUE: Multidetector CT imaging of the abdomen and pelvis was performed following the standard protocol without IV contrast. RADIATION DOSE REDUCTION: This exam was performed according to the departmental dose-optimization program which includes automated exposure control, adjustment of the mA and/or kV according to patient size and/or use of iterative reconstruction technique. COMPARISON:  CT abdomen and pelvis 10/06/2011. FINDINGS: Lower chest: There are mild patchy airspace opacities in both lung bases. Thin-walled cysts noted in the right lower lobe, new from prior measuring 2.6 cm. Hepatobiliary: No focal liver abnormality is seen. Status post cholecystectomy. No biliary dilatation. Pancreas: Unremarkable. No pancreatic ductal dilatation or surrounding inflammatory changes.  Spleen: Normal in size without focal abnormality. Adrenals/Urinary Tract: The bladder is decompressed by Foley catheter. Bilateral kidneys and adrenal glands are within normal limits. Stomach/Bowel: There is marked wall thickening/edema of the ascending colon and hepatic flexure as well as proximal transverse colon. There is no pneumatosis, bowel obstruction or free air. The appendix is not visualized. Small bowel loops are nondilated. The stomach is completely decompressed and gastric wall thickening can not be excluded. There is sigmoid and descending colon diverticulosis. Vascular/Lymphatic: Aortic atherosclerosis. No enlarged abdominal or pelvic lymph nodes. Reproductive: Status post hysterectomy. No adnexal masses. Other: There is a moderate amount of free fluid throughout the abdomen and pelvis which appears low-density. There is no abdominal wall hernia. Musculoskeletal: Degenerative changes affect the spine. IMPRESSION: 1. Marked wall thickening/edema of the ascending colon, hepatic flexure and proximal transverse colon compatible with colitis. No bowel obstruction or free air. 2. Moderate amount of free fluid throughout the abdomen and pelvis. 3. Mild patchy airspace opacities in both lung bases, worrisome for pneumonia. 4. New thin-walled cyst in the right lower lobe measuring 2.6 cm. 5. Aortic atherosclerosis. Aortic Atherosclerosis (ICD10-I70.0). Electronically Signed   By: Darliss Cheney M.D.   On: 08/11/2023 22:45   DG Chest Port 1 View Result Date: 08/11/2023 CLINICAL DATA:  Questionable sepsis - evaluate for abnormality EXAM: PORTABLE CHEST 1 VIEW COMPARISON:  07/12/2023 FINDINGS: Lung volumes are low. Stable heart size and mediastinal contours. Coronary stent visualized. No focal airspace disease, large pleural effusion or pneumothorax. Left axillary surgical clips. IMPRESSION: Low lung volumes without acute chest findings. Electronically Signed  By: Narda Rutherford M.D.   On: 08/11/2023 19:09    ECHOCARDIOGRAM LIMITED Result Date: 07/27/2023    ECHOCARDIOGRAM LIMITED REPORT   Patient Name:   LAKASHA MCFALL Date of Exam: 07/27/2023 Medical Rec #:  [id number]       Height:       61.0 in Accession #:    [id number]      Weight:       170.0 lb Date of Birth:  10-09-1945       BSA:          1.763 m Patient Age:    77 years        BP:           83/54 mmHg Patient Gender: F               HR:           69 bpm. [facility] Procedure: 2D Echo (Both Spectral and Color Flow Doppler were utilized during            procedure). Indications:    Heart failure with mildly reduced ejection fraction (HFmrEF)                 (HCC)  History:        Patient has prior history of Echocardiogram examinations, most                 recent 04/21/2023. CHF, Arrythmias:Atrial Fibrillation; Risk                 Factors:Hypertension, Diabetes, Dyslipidemia and Former Smoker.  Sonographer:    Celesta Gentile RCS Referring Phys: [id number] VISHNU P MALLIPEDDI  Sonographer Comments: Patient extremely agitated and somewhat belligerent in behaviour. IMPRESSIONS  1. Limited study with challenging images.  2. Left ventricular ejection fraction, by estimation, is 60 to 65%. The left ventricle has normal function. Left ventricular endocardial border not optimally defined to evaluate regional wall motion.  3. Right ventricular systolic function is normal. The right ventricular size is normal.  4. The mitral valve is degenerative.  5. Unable to estimate CVP. Comparison(s): Prior images reviewed side by side. Overal LVEF has improved in comparison. FINDINGS  Left Ventricle: Left ventricular ejection fraction, by estimation, is 60 to 65%. The left ventricle has normal function. Left ventricular endocardial border not optimally defined to evaluate regional wall motion. The left ventricular internal cavity size was normal in size. There is no left ventricular hypertrophy. Right Ventricle: The right ventricular size is normal. No increase in  right ventricular wall thickness. Right ventricular systolic function is normal. Pericardium: Presence of epicardial fat layer. Mitral Valve: The mitral valve is degenerative in appearance. There is mild calcification of the mitral valve leaflet(s). Mild mitral annular calcification. Venous: Unable to estimate CVP. The inferior vena cava was not well visualized. LEFT VENTRICLE PLAX 2D LVIDd:         4.40 cm LVIDs:         3.40 cm LV PW:         1.00 cm LV IVS:        0.90 cm  LEFT ATRIUM         Index LA diam:    3.40 cm 1.93 cm/m   AORTA Ao Root diam: 3.30 cm Nona Dell MD Electronically signed by Nona Dell MD Signature Date/Time: 07/27/2023/3:04:48 PM    Final     Microbiology Recent Results (from the past 240 hours)  Resp panel  by RT-PCR (RSV, Flu A&B, Covid) Anterior Nasal Swab     Status: None   Collection Time: 08/11/23  3:18 PM   Specimen: Anterior Nasal Swab  Result Value Ref Range Status   SARS Coronavirus 2 by RT PCR NEGATIVE NEGATIVE Final    Comment: (NOTE) SARS-CoV-2 target nucleic acids are NOT DETECTED.  The SARS-CoV-2 RNA is generally detectable in upper respiratory specimens during the acute phase of infection. The lowest concentration of SARS-CoV-2 viral copies this assay can detect is 138 copies/mL. A negative result does not preclude SARS-Cov-2 infection and should not be used as the sole basis for treatment or other patient management decisions. A negative result may occur with  improper specimen collection/handling, submission of specimen other than nasopharyngeal swab, presence of viral mutation(s) within the areas targeted by this assay, and inadequate number of viral copies(<138 copies/mL). A negative result must be combined with clinical observations, patient history, and epidemiological information. The expected result is Negative.  Fact Sheet for Patients:  BloggerCourse.com  Fact Sheet for Healthcare Providers:   SeriousBroker.it  This test is no t yet approved or cleared by the [place] FDA and  has been authorized for detection and/or diagnosis of SARS-CoV-2 by FDA under an Emergency Use Authorization (EUA). This EUA will remain  in effect (meaning this test can be used) for the duration of the COVID-19 declaration under Section 564(b)(1) of the Act, 21 U.S.C.section 360bbb-3(b)(1), unless the authorization is terminated  or revoked sooner.       Influenza A by PCR NEGATIVE NEGATIVE Final   Influenza B by PCR NEGATIVE NEGATIVE Final    Comment: (NOTE) The Xpert Xpress SARS-CoV-2/FLU/RSV plus assay is intended as an aid in the diagnosis of influenza from Nasopharyngeal swab specimens and should not be used as a sole basis for treatment. Nasal washings and aspirates are unacceptable for Xpert Xpress SARS-CoV-2/FLU/RSV testing.  Fact Sheet for Patients: BloggerCourse.com  Fact Sheet for Healthcare Providers: SeriousBroker.it  This test is not yet approved or cleared by the [place] FDA and has been authorized for detection and/or diagnosis of SARS-CoV-2 by FDA under an Emergency Use Authorization (EUA). This EUA will remain in effect (meaning this test can be used) for the duration of the COVID-19 declaration under Section 564(b)(1) of the Act, 21 U.S.C. section 360bbb-3(b)(1), unless the authorization is terminated or revoked.     Resp Syncytial Virus by PCR NEGATIVE NEGATIVE Final    Comment: (NOTE) Fact Sheet for Patients: BloggerCourse.com  Fact Sheet for Healthcare Providers: SeriousBroker.it  This test is not yet approved or cleared by the [place] FDA and has been authorized for detection and/or diagnosis of SARS-CoV-2 by FDA under an Emergency Use Authorization (EUA). This EUA will remain in effect (meaning this test can be used) for  the duration of the COVID-19 declaration under Section 564(b)(1) of the Act, 21 U.S.C. section 360bbb-3(b)(1), unless the authorization is terminated or revoked.  Performed at [facility], [address], [place], [place] [address]   Blood Culture (routine x 2)     Status: None   Collection Time: 08/11/23  3:46 PM   Specimen: BLOOD  Result Value Ref Range Status   Specimen Description BLOOD CENTRAL LINE  Final   Special Requests   Final    BOTTLES DRAWN AEROBIC AND ANAEROBIC Blood Culture results may not be optimal due to an inadequate volume of blood received in culture bottles   Culture   Final    NO GROWTH 5  DAYS Performed at [facility], [address], [place], [place] [address]    Report Status 08/16/2023 FINAL  Final  Blood Culture (routine x 2)     Status: None   Collection Time: 08/11/23  3:48 PM   Specimen: BLOOD  Result Value Ref Range Status   Specimen Description BLOOD BLOOD RIGHT WRIST  Final   Special Requests   Final    BOTTLES DRAWN AEROBIC ONLY Blood Culture results may not be optimal due to an inadequate volume of blood received in culture bottles   Culture   Final    NO GROWTH 5 DAYS Performed at [facility], [address], [place], [place] [address]    Report Status 08/16/2023 FINAL  Final  Urine Culture     Status: Abnormal   Collection Time: 08/11/23  4:34 PM   Specimen: Urine, Random  Result Value Ref Range Status   Specimen Description   Final    URINE, RANDOM Performed at [facility], [address] [place], [place] [address]    Special Requests   Final    NONE Reflexed from [phone] Performed at [facility], [address] [place], [place] [address]    Culture (A)  Final    >=100,000 COLONIES/mL KLEBSIELLA PNEUMONIAE TWO MORPHOLOGIES IDENTIFIED AS THE SAME ORGANISM. SUSCEPTIBILITIES ARE IDENTICAL. Performed at [facility], [address], [place], [place] [address]    Report Status 08/13/2023 FINAL  Final   Organism ID, Bacteria  KLEBSIELLA PNEUMONIAE (A)  Final      Susceptibility   Klebsiella pneumoniae - MIC*    AMPICILLIN RESISTANT Resistant     CEFAZOLIN <=4 SENSITIVE Sensitive     CEFEPIME <=0.12 SENSITIVE Sensitive     CEFTRIAXONE <=0.25 SENSITIVE Sensitive     CIPROFLOXACIN <=0.25 SENSITIVE Sensitive     GENTAMICIN <=1 SENSITIVE Sensitive     IMIPENEM <=0.25 SENSITIVE Sensitive     NITROFURANTOIN 32 SENSITIVE Sensitive     TRIMETH/SULFA <=20 SENSITIVE Sensitive     AMPICILLIN/SULBACTAM <=2 SENSITIVE Sensitive     PIP/TAZO <=4 SENSITIVE Sensitive ug/mL    * >=100,000 COLONIES/mL KLEBSIELLA PNEUMONIAE  MRSA Next Gen by PCR, Nasal     Status: Abnormal   Collection Time: 08/12/23 12:20 AM   Specimen: Nasal Mucosa; Nasal Swab  Result Value Ref Range Status   MRSA by PCR Next Gen DETECTED (A) NOT DETECTED Final    Comment: RESULT CALLED TO, READ BACK BY AND VERIFIED WITH: ASHLEY SHELTON AT 1357 08/12/23 BY A. SNYDER (NOTE) The GeneXpert MRSA Assay (FDA approved for NASAL specimens only), is one component of a comprehensive MRSA colonization surveillance program. It is not intended to diagnose MRSA infection nor to guide or monitor treatment for MRSA infections. Test performance is not FDA approved in patients less than 19 years old. Performed at [facility], [address], [place], [place] [address]     Lab Basic Metabolic Panel: Recent Labs  Lab 08/11/23 1504 08/12/23 0258 08/13/23 0418 08/14/23 0436 08/26/2023 0336  NA 151* 152* 144 148* 142  K 4.0 3.9 3.4* 2.9* 4.4  CL 122* 121* 112* 112* 112*  CO2 16* 16* 22 24 22   GLUCOSE 123* 120* 347* 184* 234*  BUN 45* 46* 44* 37* 39*  CREATININE 1.75* 1.67* 1.57* 1.44* 1.34*  CALCIUM 7.8* 7.7* 6.9* 6.7* 6.8*  MG  --   --   --  1.9 2.3   Liver Function Tests: Recent Labs  Lab 08/11/23 1504 08/13/23 0418 08/14/23  3244 08/22/2023 0336  AST 279* 266* 247* 205*  ALT 107* 106* 109* 106*  ALKPHOS 103 89 98 110  BILITOT 2.0* 1.8* 1.9* 2.2*  PROT  4.8* 4.4* 4.9* 5.1*  ALBUMIN 2.0* 1.8* 1.7* 1.8*   No results for input(s): "LIPASE", "AMYLASE" in the last 168 hours. Recent Labs  Lab 08/12/23 0258  AMMONIA 54*   CBC: Recent Labs  Lab 08/11/23 1504 08/12/23 0258 08/12/23 1034 08/12/23 2028 08/13/23 0418 08/14/23 0436 08/13/2023 0336  WBC 3.3* 3.4*  --   --  4.0 4.9 3.6*  NEUTROABS 2.7  --   --   --  3.2 3.4 2.8  HGB 6.3* 8.3* 7.8* 9.8* 9.4* 10.5* 10.3*  HCT 19.6* 24.4* 22.8* 27.6* 27.2* 30.7* 30.1*  MCV 126.5* 111.4*  --   --  104.6* 104.8* 106.7*  PLT 116* 108*  --   --  76* 68* 50*   Cardiac Enzymes: No results for input(s): "CKTOTAL", "CKMB", "CKMBINDEX", "TROPONINI" in the last 168 hours. Sepsis Labs: Recent Labs  Lab 08/11/23 1554 08/11/23 1725 08/12/23 0258 08/12/23 0650 08/13/23 0418 08/14/23 0436 08/24/2023 0336  WBC  --   --  3.4*  --  4.0 4.9 3.6*  LATICACIDVEN 3.3* 4.5*  --  1.7  --   --   --     Procedures/Operations   Riccardo Holeman 08/16/2023, 7:01 AM
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
## 2023-08-30 NOTE — Progress Notes (Signed)
 Two sisters at bedside decided to make patient comfort measures only. Morphine drip started at 3mg . Gave patient a 2mg  bolus for anxiety and restlessness.

## 2023-08-30 NOTE — Progress Notes (Signed)
 PROGRESS NOTE   [name]  EXB:[id number] DOB: [birth year] DOA: 08/11/2023 PCP: Galvin Proffer, MD   Chief Complaint  Patient presents with   Hypotension   Level of care: Med-Surg  Brief Admission History:  78 y.o. female with medical history significant for blindness; depression and anxiety, COPD, diabetes mellitus, autoimmune hepatitis steroid dependent; GERD, hypertension, dyslipidemia and hypothyroidism, who presented to the emergency room with acute onset of altered mental status with hypotension at her SNF.  She was initially diagnosed with UTI over the last few days.  She was started on oral antibiotic therapy.  Her family went to see her today and noted that she was less responsive than normal and had blood in her mouth.  The patient was nonverbal and unresponsive.  She was admitted with septic shock, hemorrhagic cystitis, pneumonia and colitis.    Assessment and Plan:  Septic Shock  HCAP - appears to be developing ARDS  - shock secondary to hemorrhagic cystitis, HCAP, acute colitis  - pt remains critically ill in ICU on pressors - pt is prednisone dependent for many years, added stress dose steroids - wean norepinephrine as able  - central line in place - with no meaningful improvements discussed with sisters at bedside decision made to transition to full comfort measures  Klebsiella UTI  - treated with ceftriaxone 2 grams IV daily    Autoimmune Hepatitis - likely has flared since being off longstanding daily prednisone - added stress dose steroids --  now transitioning to full comfort measures  Acute metabolic acidosis - RESOLVED  - resolved now, briefly treated with IV bicarbonate  Hypernatremia - treated and resolved  - added more free water to IV fluids - recheck and follow BMP  - increased rate of IV fluid  - transitioning to full comfort care on 3/17  Acute colitis - treated with IV ceftriaxone and metronidazole   Pancytopenia  - leukopenia resolved   - severe thrombocytopenia exacerbated by sepsis, chronic liver disease - holding all heparins  - transitioned to full comfort measures   Hemorrhagic cystitis - all anticoagulation discontinued  - transitioned to full comfort measures on 3/17   Paroxysmal atrial fibrillation - HR remains controlled - home amiodarone held  - apixaban discontinued due to severe bleeding  Dyslipidemia - home ezetimibe on hold for now  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 - ICU SSI protocol for glycemic control  - monitor CBG closely CBG (last 3)  Recent Labs    08/14/23 2306 08/26/2023 0259 08/17/2023 0739  GLUCAP 217* 214* 199*   AKI (acute kidney injury) - prerenal given severe dehydration - bladder scanned frequently to monitor for blood clot obstructions -  transitioned to full comfort care   Acute blood loss anemia (ABLA) - transfused 2 units PRBC on 3/14 - hg 10.3 - discontinued all anticoagulation  Hypokalemia - additional Mg and IV potassium ordered  - transitioned to full comfort care    Adrenal insufficiency  - IV solucortef added but no improvement in condition - transition to full comfort measures   Acute metabolic encephalopathy - persistent despite maximal medical therapies - pt too unstable for MRI given she remains on IV norepinephrine infusion - repeat CT head without contrast today and compared to study done 2 days ago with findings of severe cerebral atrophy but no acute CVA  Essential hypertension - remained in septic shock and pressor dependent before transition to full comfort   Obesity, Class I, BMI 30-34.9  Goals  of care  - pt not improving with maximal medical treatments - discussion with sisters and decision made to transition to full comfort measures  DVT prophylaxis: comfort care  Code Status: DNR  Family Communication: goals of care IPAL meeting with sisters on 3/17  Disposition:  transition to full comfort care    Consultants:  Palliative care   Subjective: Pt remains obtunded/encephalopathic  Objective: Vitals:   08/28/2023 0800 08/09/2023 1034 08/04/2023 1103 08/13/2023 1146  BP: 117/88 (!) 106/47  91/67  Pulse: 71 77 76 75  Resp: (!) 21 (!) 23 18 15   Temp: 98.2 F (36.8 C) 98.6 F (37 C) 98.6 F (37 C) 98.1 F (36.7 C)  TempSrc:      SpO2: 95% 96% (!) 81% (!) 78%  Weight:      Height:        Intake/Output Summary (Last 24 hours) at 08/20/2023 1214 Last data filed at 08/03/2023 1032 Gross per 24 hour  Intake 3206.83 ml  Output 100 ml  Net 3106.83 ml   Filed Weights   08/11/23 1518 08/12/23 0110  Weight: 77.1 kg 74.5 kg   Examination:  General exam: acutely and chronically ill appearing with dried blood on tongue, severe dry mucus membranes, gross red blood draining from foley bag; unresponsive, nonverbal; appears agitated and uncomfortable in moderate to severe respiratory distress on a NRB.  Respiratory system: tachypnea; on NRB: rales heard bilaterally with coarse anterior upper airway congestion noises.  Cardiovascular system: normal S1 & S2 heard.  Gastrointestinal system: Abdomen is nondistended, soft and nontender. No organomegaly or masses felt. Normal bowel sounds heard. Central nervous system: somnolent, only responds to noxious stimuli. No focal neurological deficits. Extremities: moves spontaneously; . Skin: stage 2 left/right coccyx pressure wounds. Psychiatry: Judgement and insight appear severely diminished. Mood & affect unable to assess due to condition  Pressure Injury 08/12/23 Coccyx Right Stage 2 -  Partial thickness loss of dermis presenting as a shallow open injury with a red, pink wound bed without slough. open skin, (Active)  08/12/23 0200  Location: Coccyx  Location Orientation: Right  Staging: Stage 2 -  Partial thickness loss of dermis presenting as a shallow open injury with a red, pink wound bed without slough.  Wound Description (Comments): open skin,   Present on Admission: Yes     Pressure Injury 08/12/23 Coccyx Left Stage 2 -  Partial thickness loss of dermis presenting as a shallow open injury with a red, pink wound bed without slough. small skin opening close to the other skin opening (Active)  08/12/23 0410  Location: Coccyx  Location Orientation: Left  Staging: Stage 2 -  Partial thickness loss of dermis presenting as a shallow open injury with a red, pink wound bed without slough.  Wound Description (Comments): small skin opening close to the other skin opening  Present on Admission:    Data Reviewed: I have personally reviewed following labs and imaging studies  CBC: Recent Labs  Lab 08/11/23 1504 08/12/23 0258 08/12/23 1034 08/12/23 2028 08/13/23 0418 08/14/23 0436 08/24/2023 0336  WBC 3.3* 3.4*  --   --  4.0 4.9 3.6*  NEUTROABS 2.7  --   --   --  3.2 3.4 2.8  HGB 6.3* 8.3* 7.8* 9.8* 9.4* 10.5* 10.3*  HCT 19.6* 24.4* 22.8* 27.6* 27.2* 30.7* 30.1*  MCV 126.5* 111.4*  --   --  104.6* 104.8* 106.7*  PLT 116* 108*  --   --  76* 68* 50*    Basic  Metabolic Panel: Recent Labs  Lab 08/11/23 1504 08/12/23 0258 08/13/23 0418 08/14/23 0436 08/29/2023 0336  NA 151* 152* 144 148* 142  K 4.0 3.9 3.4* 2.9* 4.4  CL 122* 121* 112* 112* 112*  CO2 16* 16* 22 24 22   GLUCOSE 123* 120* 347* 184* 234*  BUN 45* 46* 44* 37* 39*  CREATININE 1.75* 1.67* 1.57* 1.44* 1.34*  CALCIUM 7.8* 7.7* 6.9* 6.7* 6.8*  MG  --   --   --  1.9 2.3    CBG: Recent Labs  Lab 08/14/23 1622 08/14/23 1935 08/14/23 2306 08/02/2023 0259 08/02/2023 0739  GLUCAP 183* 210* 217* 214* 199*    Recent Results (from the past 240 hours)  Resp panel by RT-PCR (RSV, Flu A&B, Covid) Anterior Nasal Swab     Status: None   Collection Time: 08/11/23  3:18 PM   Specimen: Anterior Nasal Swab  Result Value Ref Range Status   SARS Coronavirus 2 by RT PCR NEGATIVE NEGATIVE Final    Comment: (NOTE) SARS-CoV-2 target nucleic acids are NOT DETECTED.  The SARS-CoV-2  RNA is generally detectable in upper respiratory specimens during the acute phase of infection. The lowest concentration of SARS-CoV-2 viral copies this assay can detect is 138 copies/mL. A negative result does not preclude SARS-Cov-2 infection and should not be used as the sole basis for treatment or other patient management decisions. A negative result may occur with  improper specimen collection/handling, submission of specimen other than nasopharyngeal swab, presence of viral mutation(s) within the areas targeted by this assay, and inadequate number of viral copies(<138 copies/mL). A negative result must be combined with clinical observations, patient history, and epidemiological information. The expected result is Negative.  Fact Sheet for Patients:  BloggerCourse.com  Fact Sheet for Healthcare Providers:  SeriousBroker.it  This test is no t yet approved or cleared by the [place] FDA and  has been authorized for detection and/or diagnosis of SARS-CoV-2 by FDA under an Emergency Use Authorization (EUA). This EUA will remain  in effect (meaning this test can be used) for the duration of the COVID-19 declaration under Section 564(b)(1) of the Act, 21 U.S.C.section 360bbb-3(b)(1), unless the authorization is terminated  or revoked sooner.       Influenza A by PCR NEGATIVE NEGATIVE Final   Influenza B by PCR NEGATIVE NEGATIVE Final    Comment: (NOTE) The Xpert Xpress SARS-CoV-2/FLU/RSV plus assay is intended as an aid in the diagnosis of influenza from Nasopharyngeal swab specimens and should not be used as a sole basis for treatment. Nasal washings and aspirates are unacceptable for Xpert Xpress SARS-CoV-2/FLU/RSV testing.  Fact Sheet for Patients: BloggerCourse.com  Fact Sheet for Healthcare Providers: SeriousBroker.it  This test is not yet approved or cleared by the  [place] FDA and has been authorized for detection and/or diagnosis of SARS-CoV-2 by FDA under an Emergency Use Authorization (EUA). This EUA will remain in effect (meaning this test can be used) for the duration of the COVID-19 declaration under Section 564(b)(1) of the Act, 21 U.S.C. section 360bbb-3(b)(1), unless the authorization is terminated or revoked.     Resp Syncytial Virus by PCR NEGATIVE NEGATIVE Final    Comment: (NOTE) Fact Sheet for Patients: BloggerCourse.com  Fact Sheet for Healthcare Providers: SeriousBroker.it  This test is not yet approved or cleared by the [place] FDA and has been authorized for detection and/or diagnosis of SARS-CoV-2 by FDA under an Emergency Use Authorization (EUA). This EUA will remain in effect (meaning this test can  be used) for the duration of the COVID-19 declaration under Section 564(b)(1) of the Act, 21 U.S.C. section 360bbb-3(b)(1), unless the authorization is terminated or revoked.  Performed at [facility], [address], [place], [place] [address]   Blood Culture (routine x 2)     Status: None (Preliminary result)   Collection Time: 08/11/23  3:46 PM   Specimen: BLOOD  Result Value Ref Range Status   Specimen Description BLOOD CENTRAL LINE  Final   Special Requests   Final    BOTTLES DRAWN AEROBIC AND ANAEROBIC Blood Culture results may not be optimal due to an inadequate volume of blood received in culture bottles   Culture   Final    NO GROWTH 4 DAYS Performed at [facility], [address], [place], [place] [address]    Report Status PENDING  Incomplete  Blood Culture (routine x 2)     Status: None (Preliminary result)   Collection Time: 08/11/23  3:48 PM   Specimen: BLOOD  Result Value Ref Range Status   Specimen Description BLOOD BLOOD RIGHT WRIST  Final   Special Requests   Final    BOTTLES DRAWN AEROBIC ONLY Blood Culture results may not be optimal due  to an inadequate volume of blood received in culture bottles   Culture   Final    NO GROWTH 4 DAYS Performed at [facility], [address] [place], [place] [address]    Report Status PENDING  Incomplete  Urine Culture     Status: Abnormal   Collection Time: 08/11/23  4:34 PM   Specimen: Urine, Random  Result Value Ref Range Status   Specimen Description   Final    URINE, RANDOM Performed at [facility], [address] [place], [place] [address]    Special Requests   Final    NONE Reflexed from [phone] Performed at [facility], [address] [place], [place] [address]    Culture (A)  Final    >=100,000 COLONIES/mL KLEBSIELLA PNEUMONIAE TWO MORPHOLOGIES IDENTIFIED AS THE SAME ORGANISM. SUSCEPTIBILITIES ARE IDENTICAL. Performed at [facility], [address] [place], [place] [address]    Report Status 08/13/2023 FINAL  Final   Organism ID, Bacteria KLEBSIELLA PNEUMONIAE (A)  Final      Susceptibility   Klebsiella pneumoniae - MIC*    AMPICILLIN RESISTANT Resistant     CEFAZOLIN <=4 SENSITIVE Sensitive     CEFEPIME <=0.12 SENSITIVE Sensitive     CEFTRIAXONE <=0.25 SENSITIVE Sensitive     CIPROFLOXACIN <=0.25 SENSITIVE Sensitive     GENTAMICIN <=1 SENSITIVE Sensitive     IMIPENEM <=0.25 SENSITIVE Sensitive     NITROFURANTOIN 32 SENSITIVE Sensitive     TRIMETH/SULFA <=20 SENSITIVE Sensitive     AMPICILLIN/SULBACTAM <=2 SENSITIVE Sensitive     PIP/TAZO <=4 SENSITIVE Sensitive ug/mL    * >=100,000 COLONIES/mL KLEBSIELLA PNEUMONIAE  MRSA Next Gen by PCR, Nasal     Status: Abnormal   Collection Time: 08/12/23 12:20 AM   Specimen: Nasal Mucosa; Nasal Swab  Result Value Ref Range Status   MRSA by PCR Next Gen DETECTED (A) NOT DETECTED Final    Comment: RESULT CALLED TO, READ BACK BY AND VERIFIED WITH: ASHLEY SHELTON AT 1357 08/12/23 BY A. SNYDER (NOTE) The GeneXpert MRSA Assay (FDA approved for NASAL specimens only), is one component of a comprehensive MRSA  colonization surveillance program. It is not intended to diagnose MRSA infection nor to guide or monitor treatment for MRSA infections. Test performance is  not FDA approved in patients less than 87 years old. Performed at [facility], [address] [place], [place] [address]      Radiology Studies: DG CHEST PORT 1 VIEW Result Date: 08/14/2023 CLINICAL DATA:  [id number] Acute respiratory failure with hypoxia [facility]) [id number] EXAM: PORTABLE CHEST 1 VIEW COMPARISON:  08/12/2023 FINDINGS: Patient is rotated. Left IJ central venous catheter is stable in positioning. Normal heart size. Worsening bilateral airspace opacities, most pronounced in the perihilar regions. Low lung volumes. No large pleural fluid collection. No pneumothorax. IMPRESSION: Worsening bilateral airspace opacities, most pronounced in the perihilar regions. Electronically Signed   By: Duanne Guess D.O.   On: 08/14/2023 11:06   CT HEAD WO CONTRAST ( ) Result Date: 08/14/2023 CLINICAL DATA:  Mental status change EXAM: CT HEAD WITHOUT CONTRAST TECHNIQUE: Contiguous axial images were obtained from the base of the skull through the vertex without intravenous contrast. RADIATION DOSE REDUCTION: This exam was performed according to the departmental dose-optimization program which includes automated exposure control, adjustment of the mA and/or kV according to patient size and/or use of iterative reconstruction technique. COMPARISON:  Two days ago FINDINGS: Brain: No evidence of acute infarction, hemorrhage, hydrocephalus, extra-axial collection or mass lesion/mass effect. Pronounced brain atrophy with chronic small vessel ischemia. Vascular: No hyperdense vessel or unexpected calcification. 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 IMPRESSION: No acute or interval finding. Advanced brain atrophy. Electronically Signed   By:  Tiburcio Pea M.D.   On: 08/14/2023 10:07   Scheduled Meds:  mouth rinse  15 mL Mouth Rinse 4 times per day   Continuous Infusions:  morphine 3 mg/hr (08/02/2023 1028)    LOS: 4 days   Critical Care Procedure Note Authorized and Performed by: Maryln Manuel MD  Total Critical Care time:  58 mins Due to a high probability of clinically significant, life threatening deterioration, the patient required my highest level of preparedness to intervene emergently and I personally spent this critical care time directly and personally managing the patient.  This critical care time included obtaining a history; examining the patient, pulse oximetry; ordering and review of studies; arranging urgent treatment with development of a management plan; evaluation of patient's response of treatment; frequent reassessment; and discussions with other providers.  This critical care time was performed to assess and manage the high probability of imminent and life threatening deterioration that could result in multi-organ failure.  It was exclusive of separately billable procedures and treating other patients and teaching time.   Standley Dakins, MD How to contact the [facility] Attending or Consulting provider 7A - 7P or covering provider during after hours 7P -7A, for this patient?  Check the care team in [facility] and look for a) attending/consulting [facility] provider listed and b) the [facility] team listed Log into [email or web address] to find provider on call.  Locate the [facility] provider you are looking for under [facility] and page to a number that you can be directly reached. If you still have difficulty reaching the provider, please page the [facility] (Director on Call) for the Hospitalists listed on amion for assistance.  08/17/2023, 12:14 PM

## 2023-08-30 NOTE — Progress Notes (Signed)
   08/27/2023 1050  Spiritual Encounters  Type of Visit Initial  Care provided to: Family  Referral source Other (comment) (Spiritual Consult)  Reason for visit End-of-life  OnCall Visit No   Chaplain responded to a spiritual consult request for family support as patient, Cherene is placed on comfort care. I met with Mindi Junker and Raynelle Fanning as they shared a life review of Quanasia and themselves together. We then prayed for Anastashia, Westerfeld and Raynelle Fanning and Seraphim's loved ones.    Valerie Roys [facility]  [phone]

## 2023-08-30 NOTE — Plan of Care (Signed)

## 2023-08-30 NOTE — IPAL (Signed)
  Interdisciplinary Goals of Care Family Meeting   Date carried out: 08/04/2023  Location of the meeting: Bedside ICU   Member's involved: Physician and Other: sisters   Durable Power of Attorney or Environmental health practitioner: sisters    Discussion: We discussed goals of care for [name].  We discussed that she seems to be developing ARDS despite broad spectrum antibiotics, pressors, fluids, steroids, now with increased agitation and discomfort, she is DNR/DNI and with her increasing respiratory distress we discussed that a morphine infusion could help alleviate her respiratory distress symptoms and sisters agree that they want to focus care on comfort and dignity.  I am anticipating a hospital death.  Comfort care orders initiated.  Updated care team.    Code status:   Code Status: Limited: Do not attempt resuscitation (DNR) -DNR-LIMITED -Do Not Intubate/DNI    Disposition: In-patient comfort care  Time spent for the meeting: 35 mins    Shivali Quackenbush, MD  08/14/2023, 9:59 AM

## 2023-08-30 NOTE — [place]2 (Signed)
 [place] MEDICAID [place]2 LEVEL OF CARE FORM     IDENTIFICATION  Patient Name: [name] Birthdate: Oct 15, 1945 Sex: female Admission Date (Current Location): 08/11/2023  [facility] and [place] Number:  [employer] and Address:  [facility],  [address], [place] [address]      Provider Number: [id number]  Attending Physician Name and Address:  Cleora Fleet, MD  Relative Name and Phone Number:       Current Level of Care: Hospital Recommended Level of Care: Skilled Nursing Facility Prior Approval Number:    Date Approved/Denied:   PASRR Number:    Discharge Plan: SNF    Current Diagnoses: Patient Active Problem List   Diagnosis Date Noted   Shock liver 08/13/2023   UTI due to Klebsiella species 08/13/2023   Acute blood loss anemia (ABLA) 08/12/2023   Pressure injury of skin 08/12/2023   AKI (acute kidney injury) (HCC) 08/12/2023   Type 2 diabetes mellitus without complications (HCC) 08/12/2023   Dyslipidemia 08/12/2023   Paroxysmal atrial fibrillation (HCC) 08/12/2023   Hemorrhagic cystitis 08/12/2023   Pancytopenia (HCC) 08/12/2023   Acute colitis 08/12/2023   Hypernatremia 08/12/2023   Acute metabolic acidosis 08/12/2023   Septic shock (HCC) 08/11/2023   Macrocytic anemia 07/13/2023   Iron deficiency anemia 07/13/2023   Chronic heart failure with mildly reduced ejection fraction (HFmrEF, 41-49%) (HCC) 07/13/2023   Depression 07/13/2023   Hypokalemia 06/29/2021   Type 2 diabetes mellitus with hyperglycemia (HCC) 06/29/2021   Insomnia 06/29/2021   Diabetic neuropathy (HCC) 06/29/2021   Autoimmune hepatitis (HCC) 01/20/2021   NASH (nonalcoholic steatohepatitis) 12/11/2020   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) 06/21/2017   Adrenal insufficiency (HCC) 12/18/2016   Acute metabolic encephalopathy 12/15/2016   COPD exacerbation (HCC) 04/24/2016   DM type 2 (diabetes mellitus, type 2) (HCC) 04/24/2016   Aortic  atherosclerosis (HCC) 04/24/2016   Acute respiratory failure with hypoxia (HCC) 10/17/2014   IBS (irritable bowel syndrome) 08/07/2012   Coronary artery disease    Gastroesophageal reflux disease    Obesity, Class I, BMI 30-34.9 04/01/2010   BLINDNESS 04/01/2010   Acquired hypothyroidism 09/29/2009   Mixed hyperlipidemia 09/29/2009   Essential hypertension 09/29/2009    Orientation RESPIRATION BLADDER Height & Weight     Self  O2 Indwelling catheter Weight: 164 lb 3.9 oz (74.5 kg) Height:  5\' 1"  (154.9 cm)  BEHAVIORAL SYMPTOMS/MOOD NEUROLOGICAL BOWEL NUTRITION STATUS      Incontinent Diet (See d/c summary)  AMBULATORY STATUS COMMUNICATION OF NEEDS Skin   Total Care Does not communicate Skin abrasions, Bruising, Other (Comment) (Stage II to coccyx with foam dressing.)                       Personal Care Assistance Level of Assistance  Bathing, Feeding, Dressing Bathing Assistance: Maximum assistance Feeding assistance: Maximum assistance Dressing Assistance: Maximum assistance     Functional Limitations Info  Sight, Hearing, Speech Sight Info: Impaired (Blind) Hearing Info: Adequate Speech Info: Impaired    SPECIAL CARE FACTORS FREQUENCY                       Contractures      Additional Factors Info  Code Status, Allergies, Psychotropic, Insulin Sliding Scale Code Status Info: DNR- limited Allergies Info: Codeine Psychotropic Info: Zoloft         Current Medications (08/19/2023):  This is the current hospital active medication list Current Facility-Administered Medications  Medication Dose Route Frequency Provider Last Rate Last Admin   0.45 % NaCl with KCl 20 mEq / L infusion   Intravenous Continuous Laural Benes, Clanford L, MD 125 mL/hr at 08/09/2023 0717 Infusion Verify at 08/27/2023 0717   acetaminophen (TYLENOL) tablet 650 mg  650 mg Oral Q6H PRN Mansy, Jan A, MD       Or   acetaminophen (TYLENOL) suppository 650 mg  650 mg Rectal Q6H PRN Mansy, Jan A,  MD   650 mg at 08/13/23 2023   albuterol (PROVENTIL) (2.5 MG/3ML) 0.083% nebulizer solution 2.5 mg  2.5 mg Nebulization Q6H PRN Mansy, Jan A, MD       azithromycin (ZITHROMAX) 500 mg in sodium chloride 0.9 % 250 mL IVPB  500 mg Intravenous Q24H Mansy, Jan A, MD   Stopped at 08/13/2023 8295   cefTRIAXone (ROCEPHIN) 2 g in sodium chloride 0.9 % 100 mL IVPB  2 g Intravenous Q24H Mansy, Jan A, MD   Stopped at 08/14/23 1731   Chlorhexidine Gluconate Cloth 2 % PADS 6 each  6 each Topical Q0600 Mansy, Jan A, MD   6 each at 08/21/2023 6213   haloperidol lactate (HALDOL) injection 1 mg  1 mg Intravenous Q6H PRN Johnson, Clanford L, MD       hydrocortisone sodium succinate (SOLU-CORTEF) 100 MG injection 100 mg  100 mg Intravenous BID Johnson, Clanford L, MD   100 mg at 08/14/23 2126   LORazepam (ATIVAN) injection 0.5 mg  0.5 mg Intravenous Q6H PRN Mansy, Jan A, MD   0.5 mg at 08/26/2023 0865   mupirocin ointment (BACTROBAN) 2 %   Nasal BID Carollee Herter, DO   Given at 08/02/2023 0818   norepinephrine (LEVOPHED) 4mg  in (0.016 mg/mL) premix infusion  2-10 mcg/min Intravenous Titrated Carollee Herter, DO 11.25 mL/hr at 08/09/2023 0717 3 mcg/min at 08/22/2023 0717   ondansetron (ZOFRAN) tablet 4 mg  4 mg Oral Q6H PRN Mansy, Jan A, MD       Or   ondansetron [facility]) injection 4 mg  4 mg Intravenous Q6H PRN Mansy, Jan A, MD       Oral care mouth rinse  15 mL Mouth Rinse 4 times per day Carollee Herter, DO   15 mL at 08/07/2023 0818   Oral care mouth rinse  15 mL Mouth Rinse PRN Carollee Herter, DO         Discharge Medications: Please see discharge summary for a list of discharge medications.  Relevant Imaging Results:  Relevant Lab Results:   Additional Information    Karn Cassis, LCSW

## 2023-08-30 DEATH — deceased

## 2023-10-20 ENCOUNTER — Ambulatory Visit: Payer: Medicare Other | Admitting: Nurse Practitioner
